# Patient Record
Sex: Male | Born: 1945
Health system: Southern US, Community
[De-identification: ages and names within clinical notes are randomized; demographics above are authoritative.]

## PROBLEM LIST (undated history)

## (undated) ENCOUNTER — Emergency Department (HOSPITAL_BASED_OUTPATIENT_CLINIC_OR_DEPARTMENT_OTHER): Admission: EM | Discharge status: Absent without leave | Payer: Medicare HMO | Source: Home / Self Care

## (undated) DIAGNOSIS — I739 Peripheral vascular disease, unspecified: Secondary | ICD-10-CM

## (undated) DIAGNOSIS — E7800 Pure hypercholesterolemia, unspecified: Secondary | ICD-10-CM

## (undated) DIAGNOSIS — L259 Unspecified contact dermatitis, unspecified cause: Secondary | ICD-10-CM

## (undated) DIAGNOSIS — N400 Enlarged prostate without lower urinary tract symptoms: Secondary | ICD-10-CM

## (undated) DIAGNOSIS — Z86010 Personal history of colonic polyps: Secondary | ICD-10-CM

## (undated) DIAGNOSIS — N138 Other obstructive and reflux uropathy: Secondary | ICD-10-CM

## (undated) DIAGNOSIS — J309 Allergic rhinitis, unspecified: Secondary | ICD-10-CM

## (undated) DIAGNOSIS — K219 Gastro-esophageal reflux disease without esophagitis: Secondary | ICD-10-CM

## (undated) HISTORY — DX: Allergic rhinitis, unspecified: J30.9

## (undated) HISTORY — PX: ROTATOR CUFF REPAIR: SHX139

## (undated) HISTORY — DX: Personal history of colonic polyps: Z86.010

## (undated) HISTORY — DX: Unspecified contact dermatitis, unspecified cause: L25.9

## (undated) HISTORY — PX: VASECTOMY: SHX75

## (undated) HISTORY — DX: Pure hypercholesterolemia, unspecified: E78.00

## (undated) HISTORY — DX: Peripheral vascular disease, unspecified: I73.9

## (undated) HISTORY — DX: Other obstructive and reflux uropathy: N13.8

## (undated) HISTORY — DX: Benign prostatic hyperplasia without lower urinary tract symptoms: N40.0

## (undated) HISTORY — DX: Gastro-esophageal reflux disease without esophagitis: K21.9

---

## 1999-06-03 ENCOUNTER — Ambulatory Visit (HOSPITAL_COMMUNITY): Admission: RE | Admit: 1999-06-03 | Discharge: 1999-06-03 | Payer: Self-pay | Admitting: Gastroenterology

## 2004-08-25 ENCOUNTER — Ambulatory Visit (HOSPITAL_COMMUNITY): Admission: RE | Admit: 2004-08-25 | Discharge: 2004-08-25 | Payer: Self-pay | Admitting: Gastroenterology

## 2004-09-08 ENCOUNTER — Ambulatory Visit: Payer: Self-pay | Admitting: Internal Medicine

## 2004-12-23 ENCOUNTER — Ambulatory Visit: Payer: Self-pay | Admitting: Internal Medicine

## 2005-02-12 ENCOUNTER — Ambulatory Visit (HOSPITAL_BASED_OUTPATIENT_CLINIC_OR_DEPARTMENT_OTHER): Admission: RE | Admit: 2005-02-12 | Discharge: 2005-02-12 | Payer: Self-pay | Admitting: Orthopedic Surgery

## 2005-02-12 ENCOUNTER — Observation Stay (HOSPITAL_COMMUNITY): Admission: AD | Admit: 2005-02-12 | Discharge: 2005-02-13 | Payer: Self-pay | Admitting: Orthopedic Surgery

## 2005-06-10 ENCOUNTER — Ambulatory Visit (HOSPITAL_COMMUNITY): Admission: RE | Admit: 2005-06-10 | Discharge: 2005-06-10 | Payer: Self-pay | Admitting: Orthopedic Surgery

## 2005-09-15 ENCOUNTER — Ambulatory Visit: Payer: Self-pay | Admitting: Internal Medicine

## 2005-10-14 ENCOUNTER — Ambulatory Visit: Payer: Self-pay | Admitting: Internal Medicine

## 2006-06-10 ENCOUNTER — Ambulatory Visit: Payer: Self-pay | Admitting: Internal Medicine

## 2006-09-22 ENCOUNTER — Ambulatory Visit: Payer: Self-pay | Admitting: Internal Medicine

## 2006-09-22 LAB — CONVERTED CEMR LAB
ALT: 24 units/L (ref 0–40)
AST: 35 units/L (ref 0–37)
Albumin: 4 g/dL (ref 3.5–5.2)
Alkaline Phosphatase: 77 units/L (ref 39–117)
BUN: 12 mg/dL (ref 6–23)
CO2: 31 meq/L (ref 19–32)
Calcium: 10.1 mg/dL (ref 8.4–10.5)
Chloride: 102 meq/L (ref 96–112)
Chol/HDL Ratio, serum: 4.1
Cholesterol: 259 mg/dL (ref 0–200)
Creatinine, Ser: 1 mg/dL (ref 0.4–1.5)
GFR calc non Af Amer: 81 mL/min
Glomerular Filtration Rate, Af Am: 98 mL/min/{1.73_m2}
Glucose, Bld: 95 mg/dL (ref 70–99)
HCT: 47.7 % (ref 39.0–52.0)
HDL: 62.5 mg/dL (ref >39.0)
Hemoglobin: 15.5 g/dL (ref 13.0–17.0)
LDL DIRECT: 193.1 mg/dL
MCHC: 32.4 g/dL (ref 30.0–36.0)
MCV: 92.7 fL (ref 78.0–100.0)
PSA: 0.47 ng/mL (ref 0.10–4.00)
Platelets: 261 10*3/uL (ref 150–400)
Potassium: 5.3 meq/L — ABNORMAL HIGH (ref 3.5–5.1)
RBC: 5.15 M/uL (ref 4.22–5.81)
RDW: 12.4 % (ref 11.5–14.6)
Sodium: 140 meq/L (ref 135–145)
Total Bilirubin: 0.8 mg/dL (ref 0.3–1.2)
Total Protein: 7.3 g/dL (ref 6.0–8.3)
Triglyceride fasting, serum: 87 mg/dL (ref 0–149)
VLDL: 17 mg/dL (ref 0–40)
WBC: 8.2 10*3/uL (ref 4.5–10.5)

## 2006-12-07 ENCOUNTER — Ambulatory Visit: Payer: Self-pay | Admitting: Internal Medicine

## 2006-12-07 LAB — CONVERTED CEMR LAB
AST: 26 units/L (ref 0–37)
Cholesterol: 207 mg/dL (ref 0–200)
Total CK: 81 units/L (ref 7–195)

## 2006-12-13 ENCOUNTER — Ambulatory Visit: Payer: Self-pay | Admitting: Internal Medicine

## 2007-05-27 ENCOUNTER — Encounter: Payer: Self-pay | Admitting: Internal Medicine

## 2007-05-27 DIAGNOSIS — N400 Enlarged prostate without lower urinary tract symptoms: Secondary | ICD-10-CM

## 2007-05-27 DIAGNOSIS — Z860100 Personal history of colon polyps, unspecified: Secondary | ICD-10-CM

## 2007-05-27 DIAGNOSIS — K219 Gastro-esophageal reflux disease without esophagitis: Secondary | ICD-10-CM

## 2007-05-27 HISTORY — DX: Gastro-esophageal reflux disease without esophagitis: K21.9

## 2007-05-27 HISTORY — DX: Personal history of colonic polyps: Z86.010

## 2007-05-27 HISTORY — DX: Personal history of colon polyps, unspecified: Z86.0100

## 2007-05-27 HISTORY — DX: Benign prostatic hyperplasia without lower urinary tract symptoms: N40.0

## 2007-06-09 ENCOUNTER — Ambulatory Visit: Payer: Self-pay | Admitting: Internal Medicine

## 2007-09-26 ENCOUNTER — Ambulatory Visit: Payer: Self-pay | Admitting: Internal Medicine

## 2007-09-26 DIAGNOSIS — E7800 Pure hypercholesterolemia, unspecified: Secondary | ICD-10-CM

## 2007-09-26 DIAGNOSIS — J309 Allergic rhinitis, unspecified: Secondary | ICD-10-CM | POA: Insufficient documentation

## 2007-09-26 HISTORY — DX: Pure hypercholesterolemia, unspecified: E78.00

## 2007-09-26 HISTORY — DX: Allergic rhinitis, unspecified: J30.9

## 2007-09-26 LAB — CONVERTED CEMR LAB
ALT: 41 units/L (ref 0–53)
AST: 37 units/L (ref 0–37)
Albumin: 4.1 g/dL (ref 3.5–5.2)
Alkaline Phosphatase: 87 units/L (ref 39–117)
BUN: 11 mg/dL (ref 6–23)
Basophils Absolute: 0.1 10*3/uL (ref 0.0–0.1)
Basophils Relative: 0.8 % (ref 0.0–1.0)
Bilirubin, Direct: 0.2 mg/dL (ref 0.0–0.3)
CO2: 28 meq/L (ref 19–32)
Calcium: 10.1 mg/dL (ref 8.4–10.5)
Chloride: 97 meq/L (ref 96–112)
Cholesterol: 187 mg/dL (ref 0–200)
Creatinine, Ser: 0.9 mg/dL (ref 0.4–1.5)
Eosinophils Absolute: 0.5 10*3/uL (ref 0.0–0.6)
Eosinophils Relative: 6.3 % — ABNORMAL HIGH (ref 0.0–5.0)
GFR calc Af Amer: 110 mL/min
GFR calc non Af Amer: 91 mL/min
Glucose, Bld: 96 mg/dL (ref 70–99)
HCT: 44.9 % (ref 39.0–52.0)
HDL: 66.3 mg/dL (ref >39.0)
Hemoglobin: 15.5 g/dL (ref 13.0–17.0)
LDL Cholesterol: 105 mg/dL — ABNORMAL HIGH (ref 0–99)
Lymphocytes Relative: 28.5 % (ref 12.0–46.0)
MCHC: 34.6 g/dL (ref 30.0–36.0)
MCV: 90 fL (ref 78.0–100.0)
Monocytes Absolute: 0.6 10*3/uL (ref 0.2–0.7)
Monocytes Relative: 8.1 % (ref 3.0–11.0)
Neutro Abs: 4.5 10*3/uL (ref 1.4–7.7)
Neutrophils Relative %: 56.3 % (ref 43.0–77.0)
PSA: 1.15 ng/mL (ref 0.10–4.00)
Platelets: 230 10*3/uL (ref 150–400)
Potassium: 4.9 meq/L (ref 3.5–5.1)
RBC: 4.99 M/uL (ref 4.22–5.81)
RDW: 12.4 % (ref 11.5–14.6)
Sodium: 134 meq/L — ABNORMAL LOW (ref 135–145)
TSH: 1.07 microintl units/mL (ref 0.35–5.50)
Total Bilirubin: 0.9 mg/dL (ref 0.3–1.2)
Total CHOL/HDL Ratio: 2.8
Total Protein: 7.3 g/dL (ref 6.0–8.3)
Triglycerides: 79 mg/dL (ref 0–149)
VLDL: 16 mg/dL (ref 0–40)
WBC: 8 10*3/uL (ref 4.5–10.5)

## 2007-10-26 ENCOUNTER — Telehealth: Payer: Self-pay | Admitting: Internal Medicine

## 2007-12-06 ENCOUNTER — Telehealth (INDEPENDENT_AMBULATORY_CARE_PROVIDER_SITE_OTHER): Payer: Self-pay | Admitting: *Deleted

## 2007-12-22 ENCOUNTER — Encounter: Payer: Self-pay | Admitting: Internal Medicine

## 2008-01-16 ENCOUNTER — Observation Stay (HOSPITAL_COMMUNITY): Admission: EM | Admit: 2008-01-16 | Discharge: 2008-01-17 | Payer: Self-pay | Admitting: Emergency Medicine

## 2008-01-16 ENCOUNTER — Ambulatory Visit: Payer: Self-pay | Admitting: Internal Medicine

## 2008-01-16 IMAGING — CR DG CHEST 1V PORT
1 series · 1 of 1 positions shown · non-contrast
Comparison: None

CLINICAL DATA: Chest pain

PORTABLE CHEST - 1 VIEW

[AP]
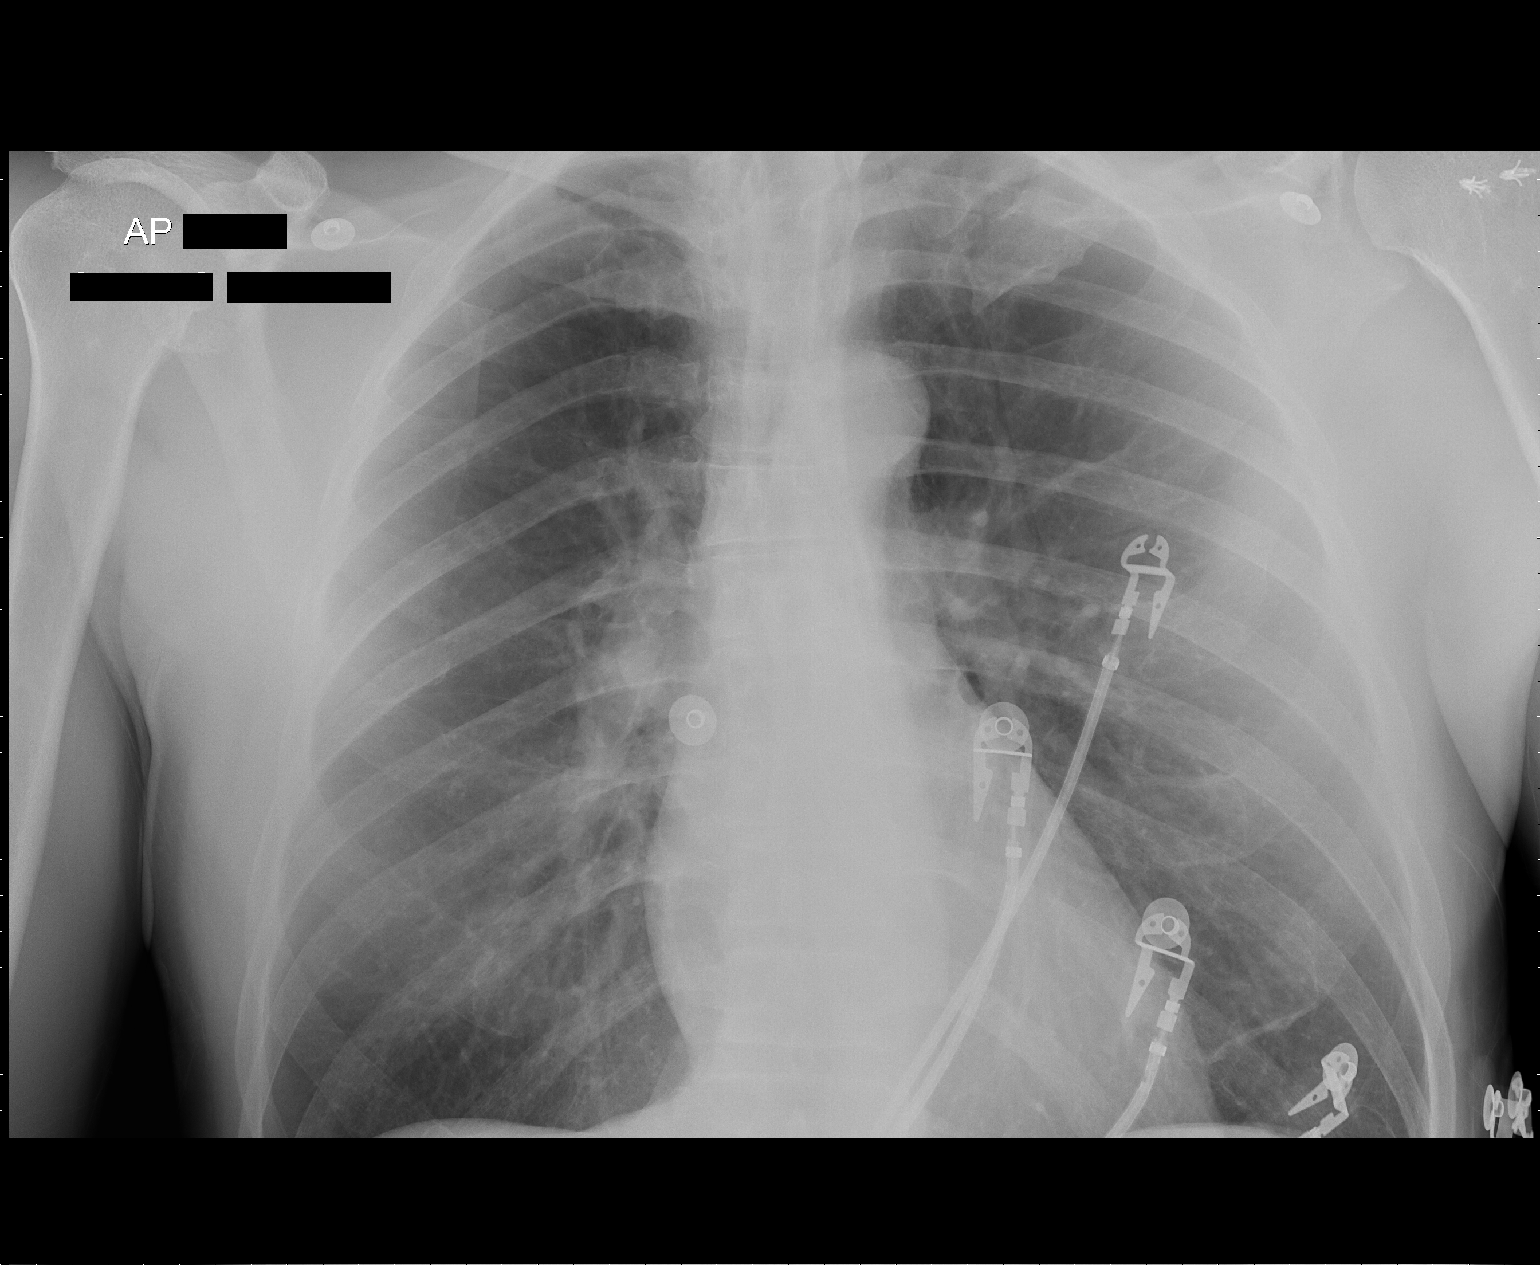

[1 of 1 positions shown; findings below may reference images not displayed]

FINDINGS: The cardiomediastinal silhouette is unremarkable.  There is no
acute infiltrate or pleural effusion.  There is no pulmonary edema.
2  metallic anchors are seen left humeral head.
IMPRESSION: No active disease.

## 2008-01-20 ENCOUNTER — Ambulatory Visit: Payer: Self-pay

## 2008-01-20 ENCOUNTER — Encounter: Payer: Self-pay | Admitting: Internal Medicine

## 2008-01-23 ENCOUNTER — Telehealth: Payer: Self-pay | Admitting: Internal Medicine

## 2008-02-13 ENCOUNTER — Ambulatory Visit: Payer: Self-pay | Admitting: Cardiovascular Disease

## 2008-06-26 ENCOUNTER — Telehealth: Payer: Self-pay | Admitting: Internal Medicine

## 2008-09-11 ENCOUNTER — Telehealth: Payer: Self-pay | Admitting: Internal Medicine

## 2008-09-25 ENCOUNTER — Ambulatory Visit: Payer: Self-pay | Admitting: Internal Medicine

## 2008-09-25 LAB — CONVERTED CEMR LAB
ALT: 23 units/L (ref 0–53)
AST: 32 units/L (ref 0–37)
Albumin: 4.2 g/dL (ref 3.5–5.2)
Alkaline Phosphatase: 74 units/L (ref 39–117)
BUN: 8 mg/dL (ref 6–23)
Basophils Absolute: 0 10*3/uL (ref 0.0–0.1)
Basophils Relative: 0.3 % (ref 0.0–3.0)
Bilirubin, Direct: 0.1 mg/dL (ref 0.0–0.3)
CO2: 32 meq/L (ref 19–32)
Calcium: 9.7 mg/dL (ref 8.4–10.5)
Chloride: 101 meq/L (ref 96–112)
Cholesterol: 174 mg/dL (ref 0–200)
Creatinine, Ser: 0.9 mg/dL (ref 0.4–1.5)
Eosinophils Absolute: 0.3 10*3/uL (ref 0.0–0.7)
Eosinophils Relative: 4.3 % (ref 0.0–5.0)
GFR calc Af Amer: 110 mL/min
GFR calc non Af Amer: 91 mL/min
Glucose, Bld: 102 mg/dL — ABNORMAL HIGH (ref 70–99)
HCT: 48.7 % (ref 39.0–52.0)
HDL: 77.1 mg/dL (ref >39.0)
Hemoglobin: 16.2 g/dL (ref 13.0–17.0)
LDL Cholesterol: 85 mg/dL (ref 0–99)
Lymphocytes Relative: 23.5 % (ref 12.0–46.0)
MCHC: 33.1 g/dL (ref 30.0–36.0)
MCV: 92.4 fL (ref 78.0–100.0)
Monocytes Absolute: 0.6 10*3/uL (ref 0.1–1.0)
Monocytes Relative: 8.8 % (ref 3.0–12.0)
Neutro Abs: 4 10*3/uL (ref 1.4–7.7)
Neutrophils Relative %: 63.1 % (ref 43.0–77.0)
PSA: 1.32 ng/mL (ref 0.10–4.00)
Platelets: 212 10*3/uL (ref 150–400)
Potassium: 4.6 meq/L (ref 3.5–5.1)
RBC: 5.27 M/uL (ref 4.22–5.81)
RDW: 12.5 % (ref 11.5–14.6)
Sodium: 139 meq/L (ref 135–145)
TSH: 1.25 microintl units/mL (ref 0.35–5.50)
Total Bilirubin: 1 mg/dL (ref 0.3–1.2)
Total CHOL/HDL Ratio: 2.3
Total Protein: 7.9 g/dL (ref 6.0–8.3)
Triglycerides: 59 mg/dL (ref 0–149)
VLDL: 12 mg/dL (ref 0–40)
WBC: 6.4 10*3/uL (ref 4.5–10.5)

## 2008-09-27 LAB — CONVERTED CEMR LAB: HCV Ab: NEGATIVE

## 2008-10-25 ENCOUNTER — Telehealth: Payer: Self-pay | Admitting: Internal Medicine

## 2008-11-30 IMAGING — CR DG WRIST COMPLETE 3+V*L*
4 series · 4 of 4 positions shown · non-contrast
Comparison: None

CLINICAL DATA: Wrist injury with pain.

LEFT WRIST - COMPLETE 3+ VIEW

[x wrist pa left]
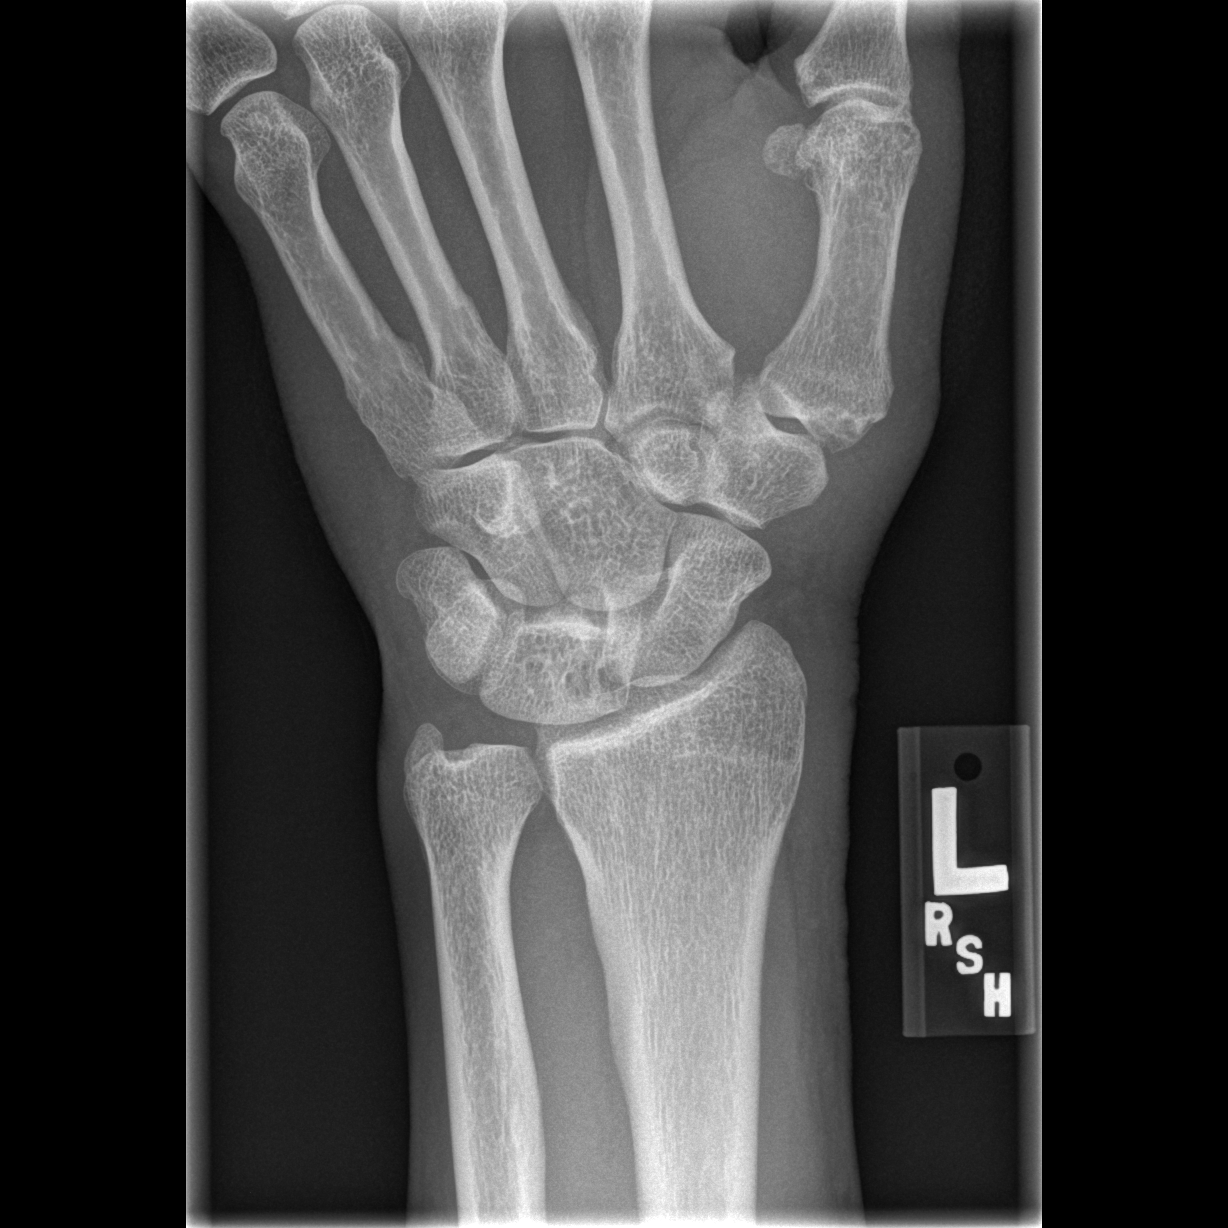

[x wrist obl left]
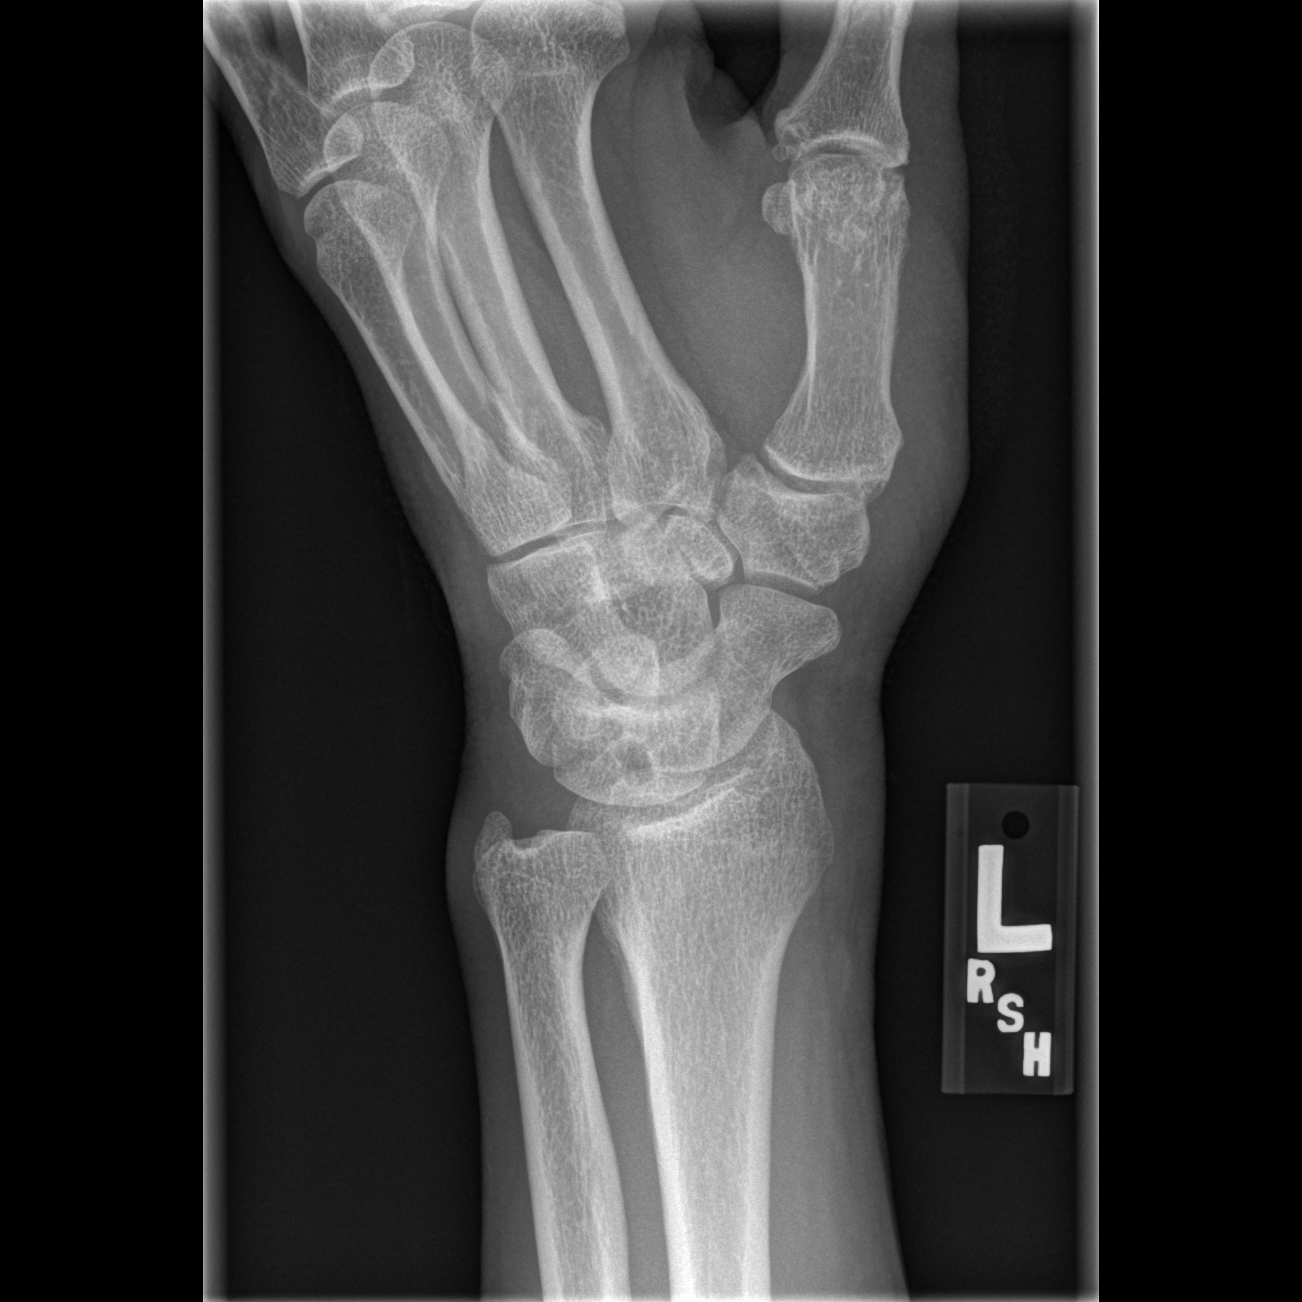

[x wrist lat left]
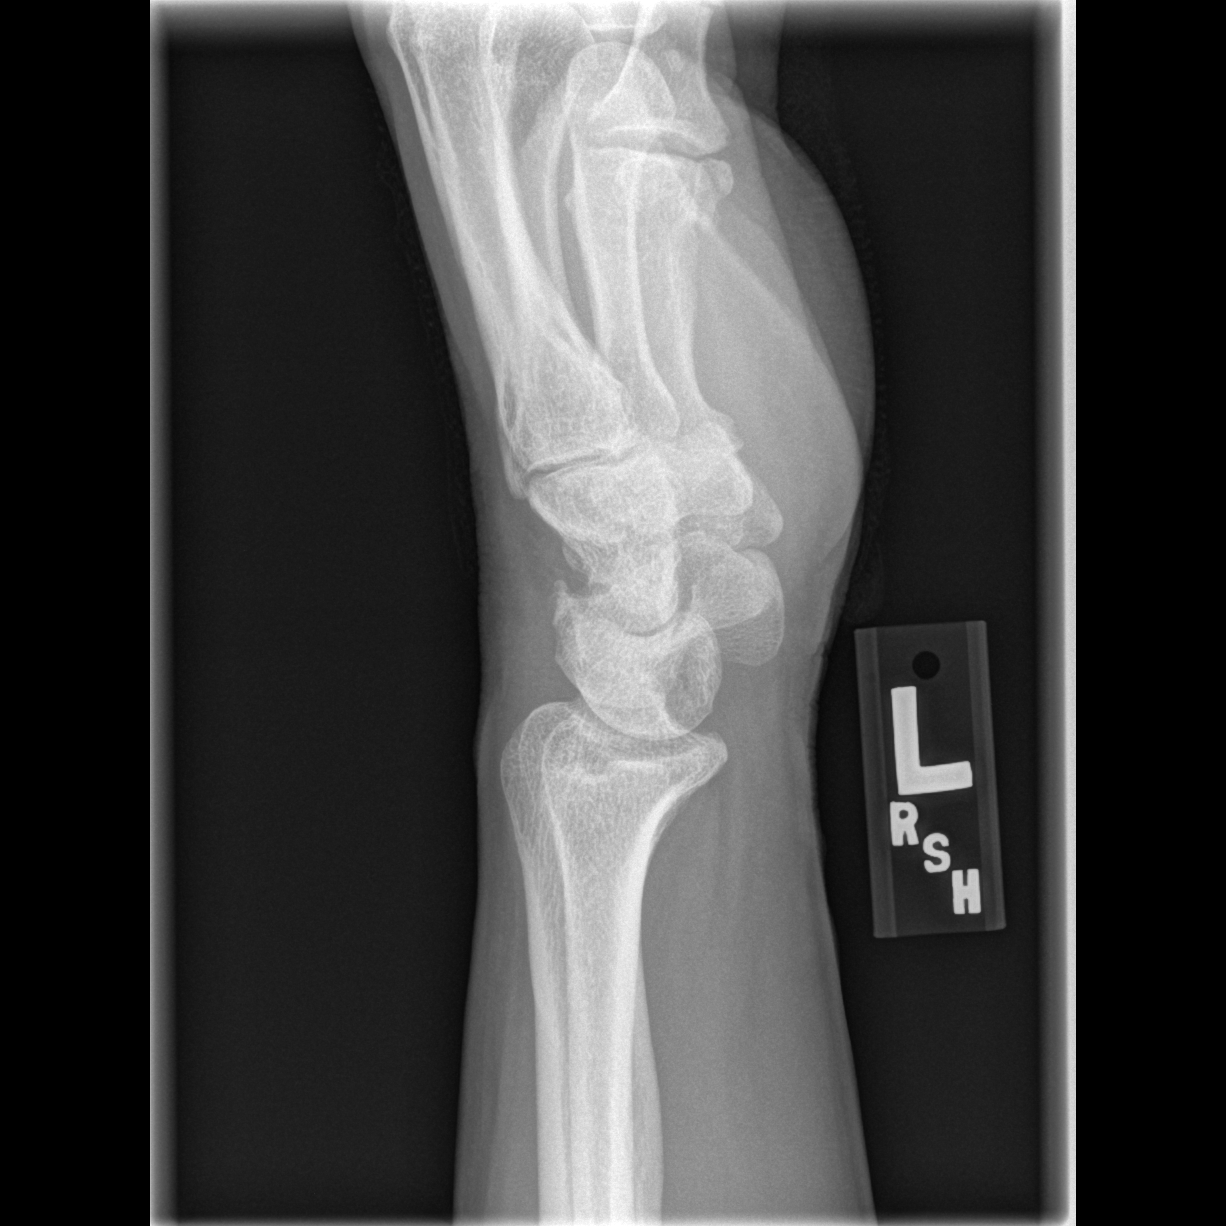

[x navicular]
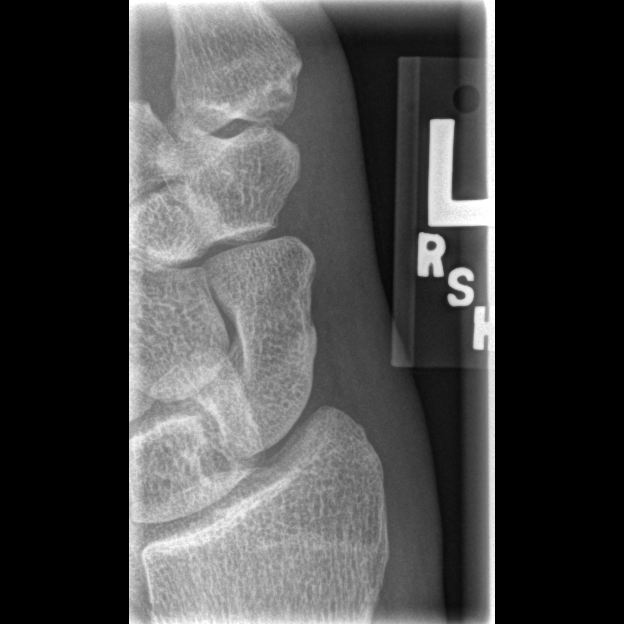

[4 of 4 positions shown; findings below may reference images not displayed]

FINDINGS: There is no evidence of acute fracture, subluxation, or
dislocation.
Mild degenerative changes within the wrist and first
carpometacarpal joint are noted.
Lunate cysts are present.
No radiopaque foreign bodies are identified.
IMPRESSION: No evidence of acute bony abnormality.

## 2009-01-22 ENCOUNTER — Telehealth: Payer: Self-pay | Admitting: Internal Medicine

## 2009-01-23 ENCOUNTER — Telehealth: Payer: Self-pay | Admitting: Internal Medicine

## 2009-01-28 ENCOUNTER — Telehealth: Payer: Self-pay | Admitting: Internal Medicine

## 2009-08-06 ENCOUNTER — Encounter: Payer: Self-pay | Admitting: Internal Medicine

## 2009-09-25 ENCOUNTER — Ambulatory Visit: Payer: Self-pay | Admitting: Internal Medicine

## 2009-10-07 ENCOUNTER — Telehealth: Payer: Self-pay | Admitting: Internal Medicine

## 2009-10-14 ENCOUNTER — Encounter: Payer: Self-pay | Admitting: Internal Medicine

## 2009-10-19 LAB — HM COLONOSCOPY

## 2009-10-25 ENCOUNTER — Ambulatory Visit: Payer: Self-pay | Admitting: Internal Medicine

## 2009-10-25 DIAGNOSIS — J069 Acute upper respiratory infection, unspecified: Secondary | ICD-10-CM | POA: Insufficient documentation

## 2009-11-04 ENCOUNTER — Encounter: Payer: Self-pay | Admitting: Internal Medicine

## 2009-11-19 ENCOUNTER — Ambulatory Visit: Payer: Self-pay | Admitting: Internal Medicine

## 2009-11-19 DIAGNOSIS — J019 Acute sinusitis, unspecified: Secondary | ICD-10-CM | POA: Insufficient documentation

## 2009-11-22 ENCOUNTER — Telehealth: Payer: Self-pay | Admitting: Internal Medicine

## 2009-11-22 ENCOUNTER — Emergency Department (HOSPITAL_COMMUNITY): Admission: EM | Admit: 2009-11-22 | Discharge: 2009-11-22 | Payer: Self-pay | Admitting: Emergency Medicine

## 2009-11-22 IMAGING — CR DG FINGER THUMB 2+V*L*
3 series · 3 of 3 positions shown · non-contrast
Comparison: None

Addendum Begins

This should read there is an oblique fracture through the proximal
phalanx.
Addendum Ends
CLINICAL DATA: Injury to left thumb.
LEFT THUMB 2+V

[x finger pa left]
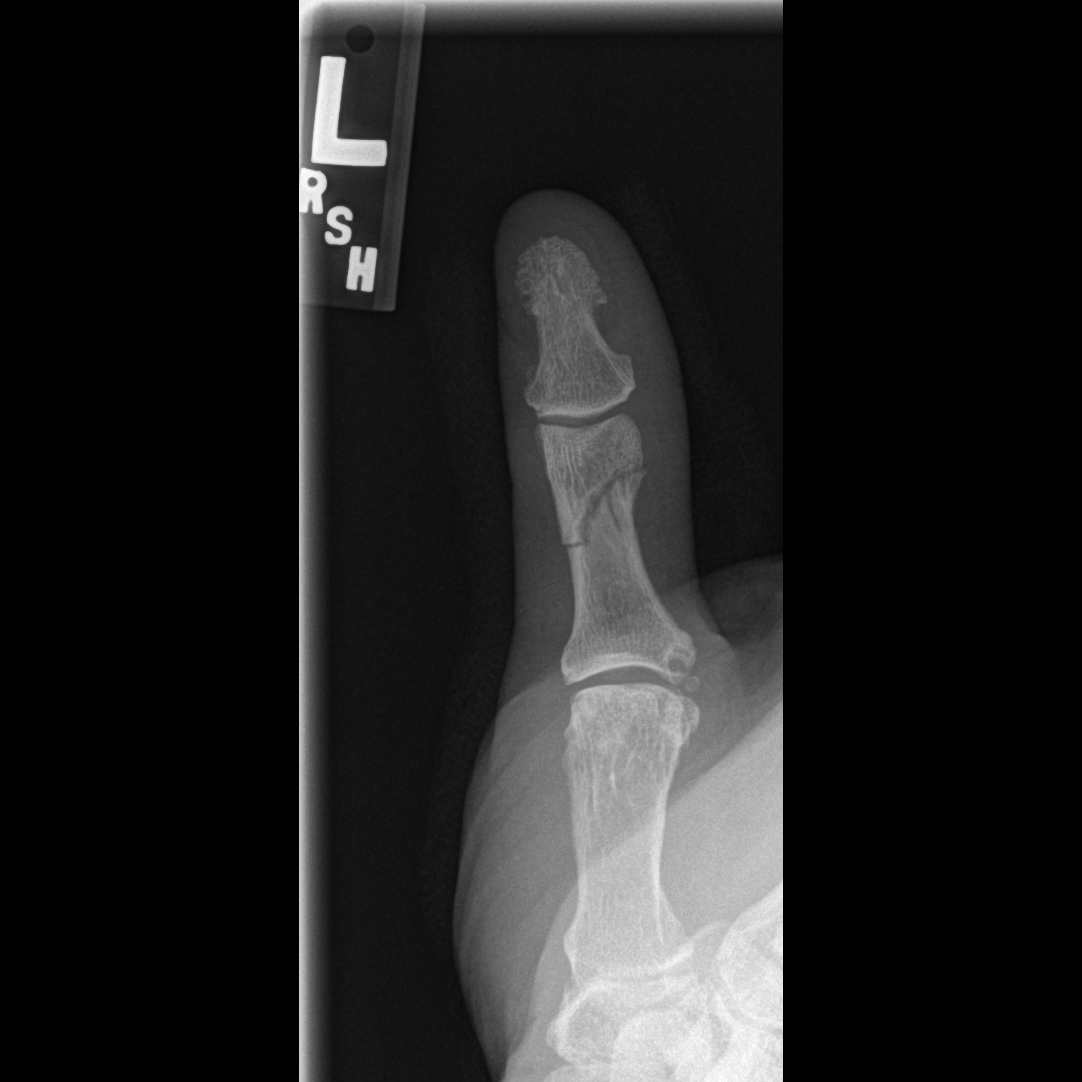

[x finger obl. left]
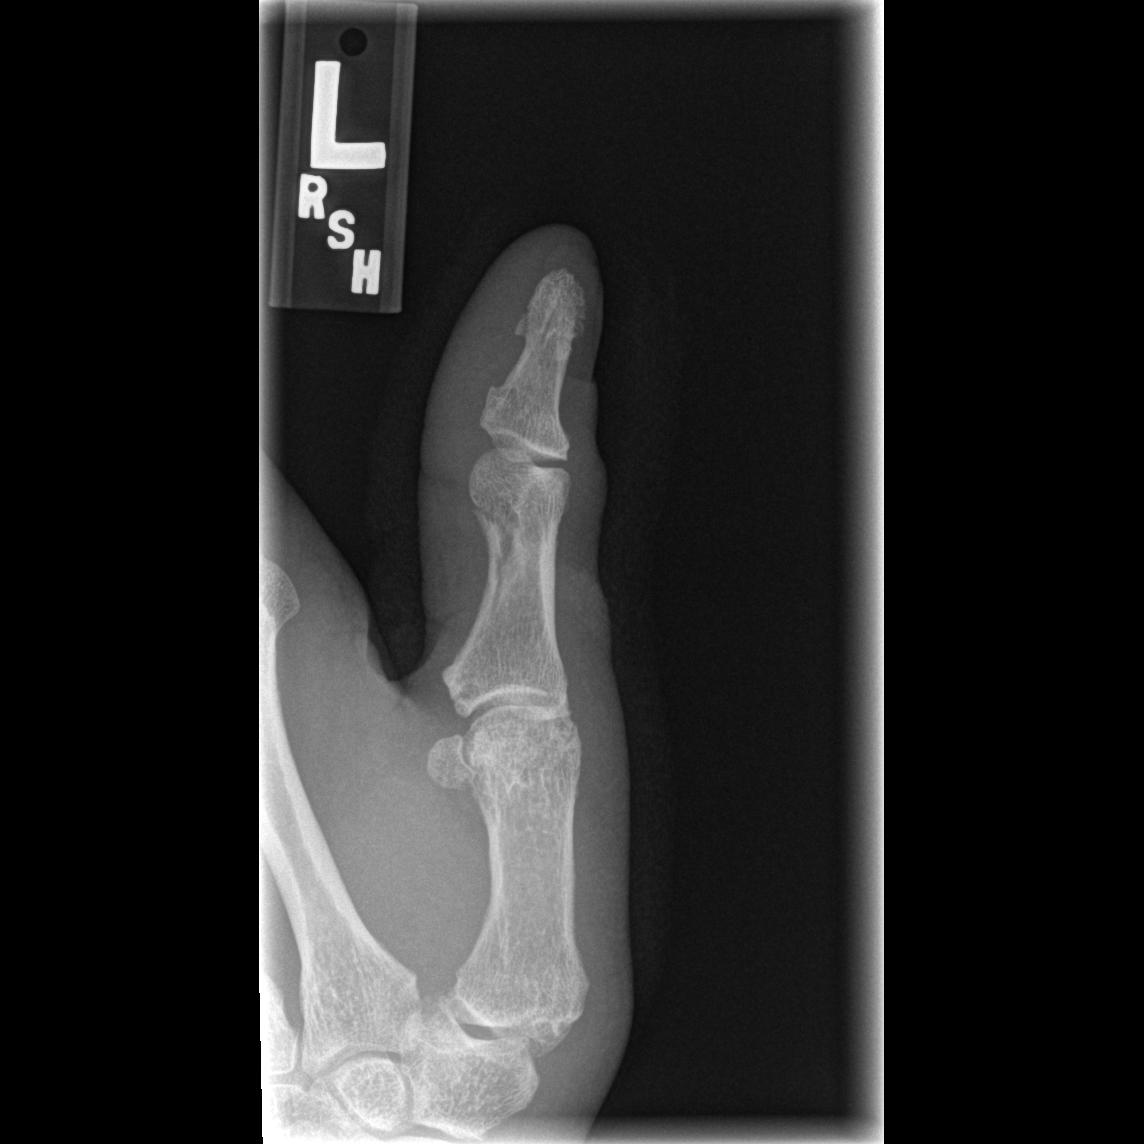

[x finger lateral left]
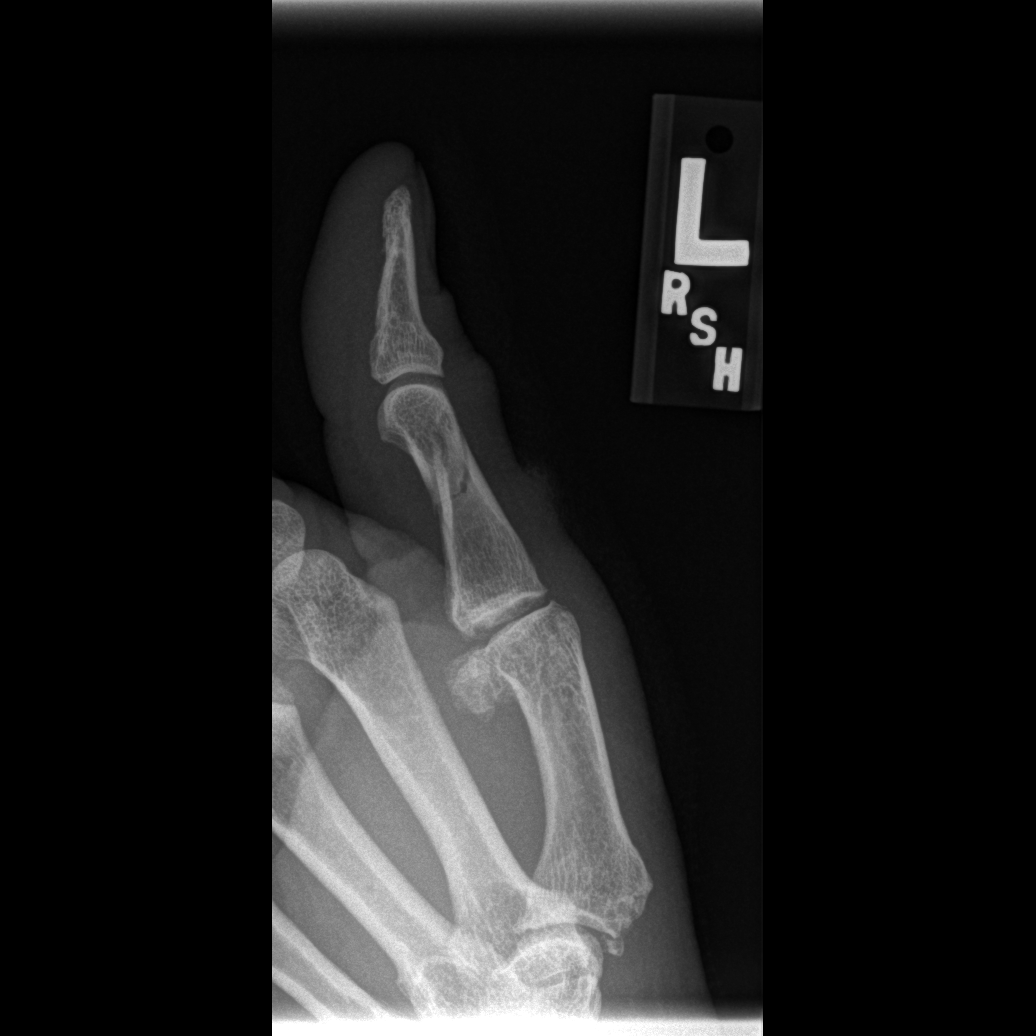

[3 of 3 positions shown; findings below may reference images not displayed]

FINDINGS: An oblique fracture through the middle phalanx is noted.
This fracture does not extend intraarticularly.
There is no evidence of subluxation or dislocation.
Mild degenerative changes at the first carpometacarpal joint and
first MCP joint are noted.
No radiopaque foreign bodies are identified.
IMPRESSION: Oblique fracture of the middle phalanx.

## 2009-11-22 IMAGING — CR DG FOREARM 2V*L*
2 series · 2 of 2 positions shown · non-contrast
Comparison: [HOSPITAL] left thumb radiographs and left
wrist radiographs [DATE].

CLINICAL DATA: Laceration left thumb and wrist.

LEFT FOREARM - 2 VIEW

[x forearm ap left]
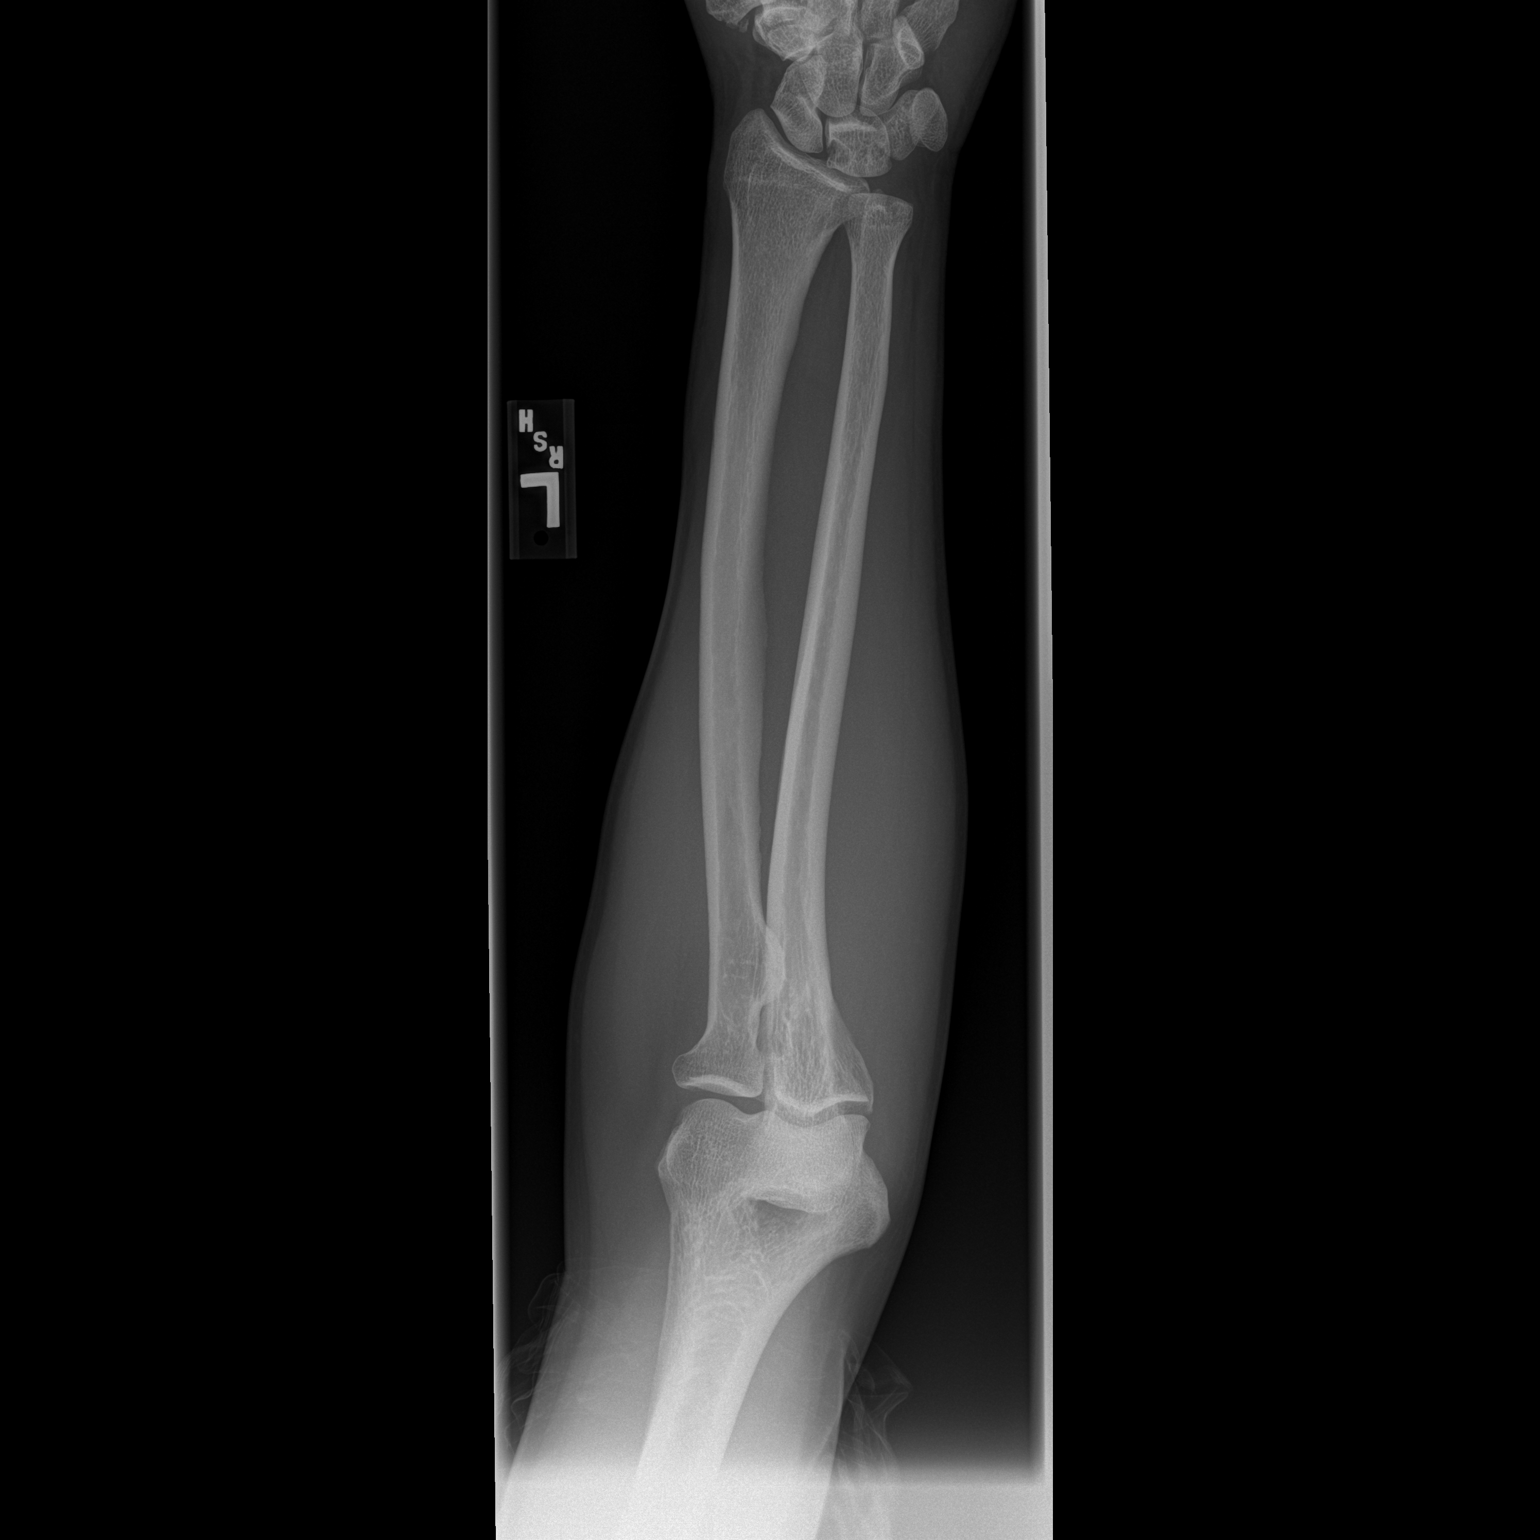

[x forearm lat left]
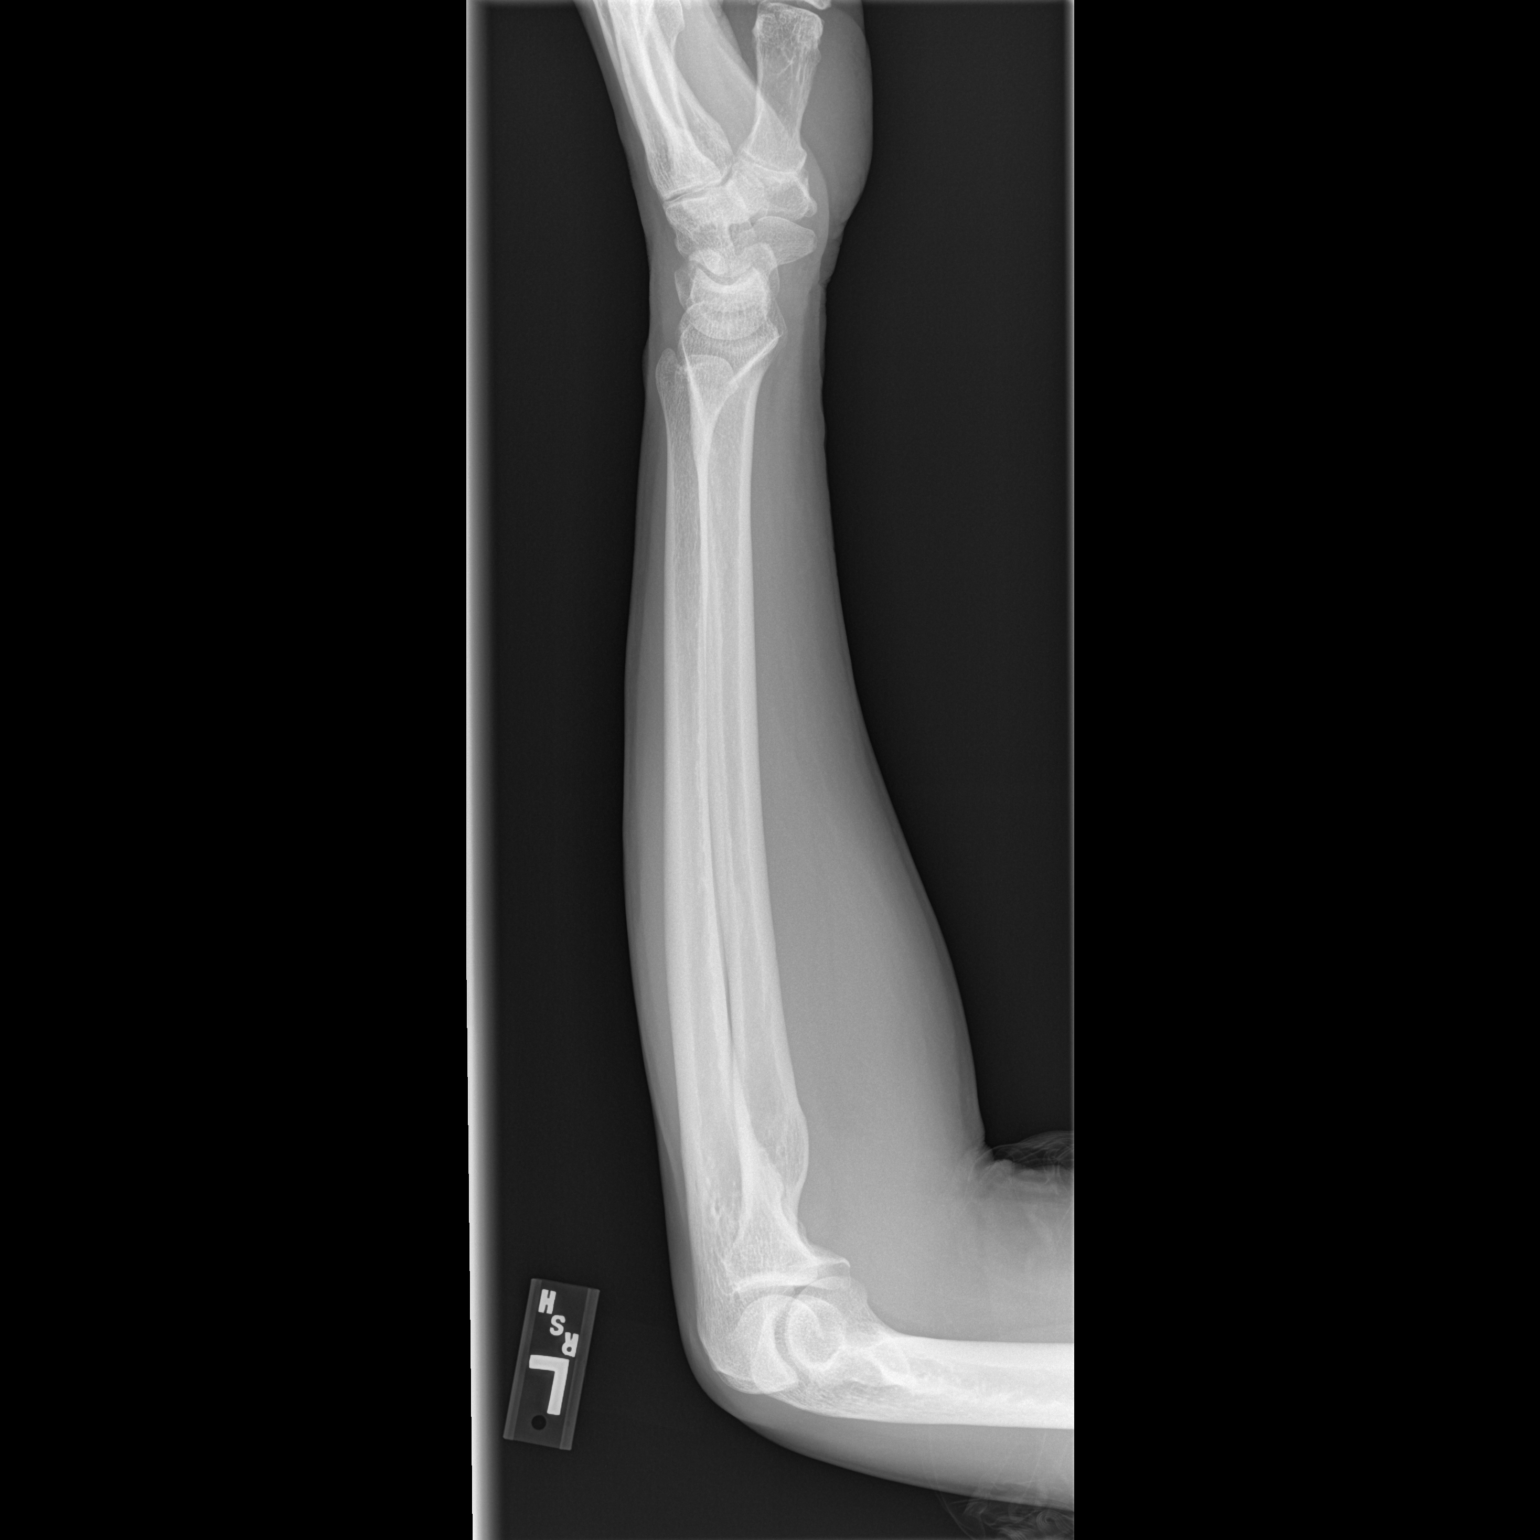

[2 of 2 positions shown; findings below may reference images not displayed]

FINDINGS: No acute fracture, subluxation, dislocation or radiopaque
foreign bodies seen. Slight degenerative lunate subchondral cysts
seen.
IMPRESSION: No acute findings.

## 2009-11-22 IMAGING — CR DG HAND COMPLETE 3+V*L*
3 series · 3 of 3 positions shown · non-contrast
Comparison: None.

CLINICAL DATA: Left hand laceration.

LEFT HAND - COMPLETE 3+ VIEW

[x hand pa left]
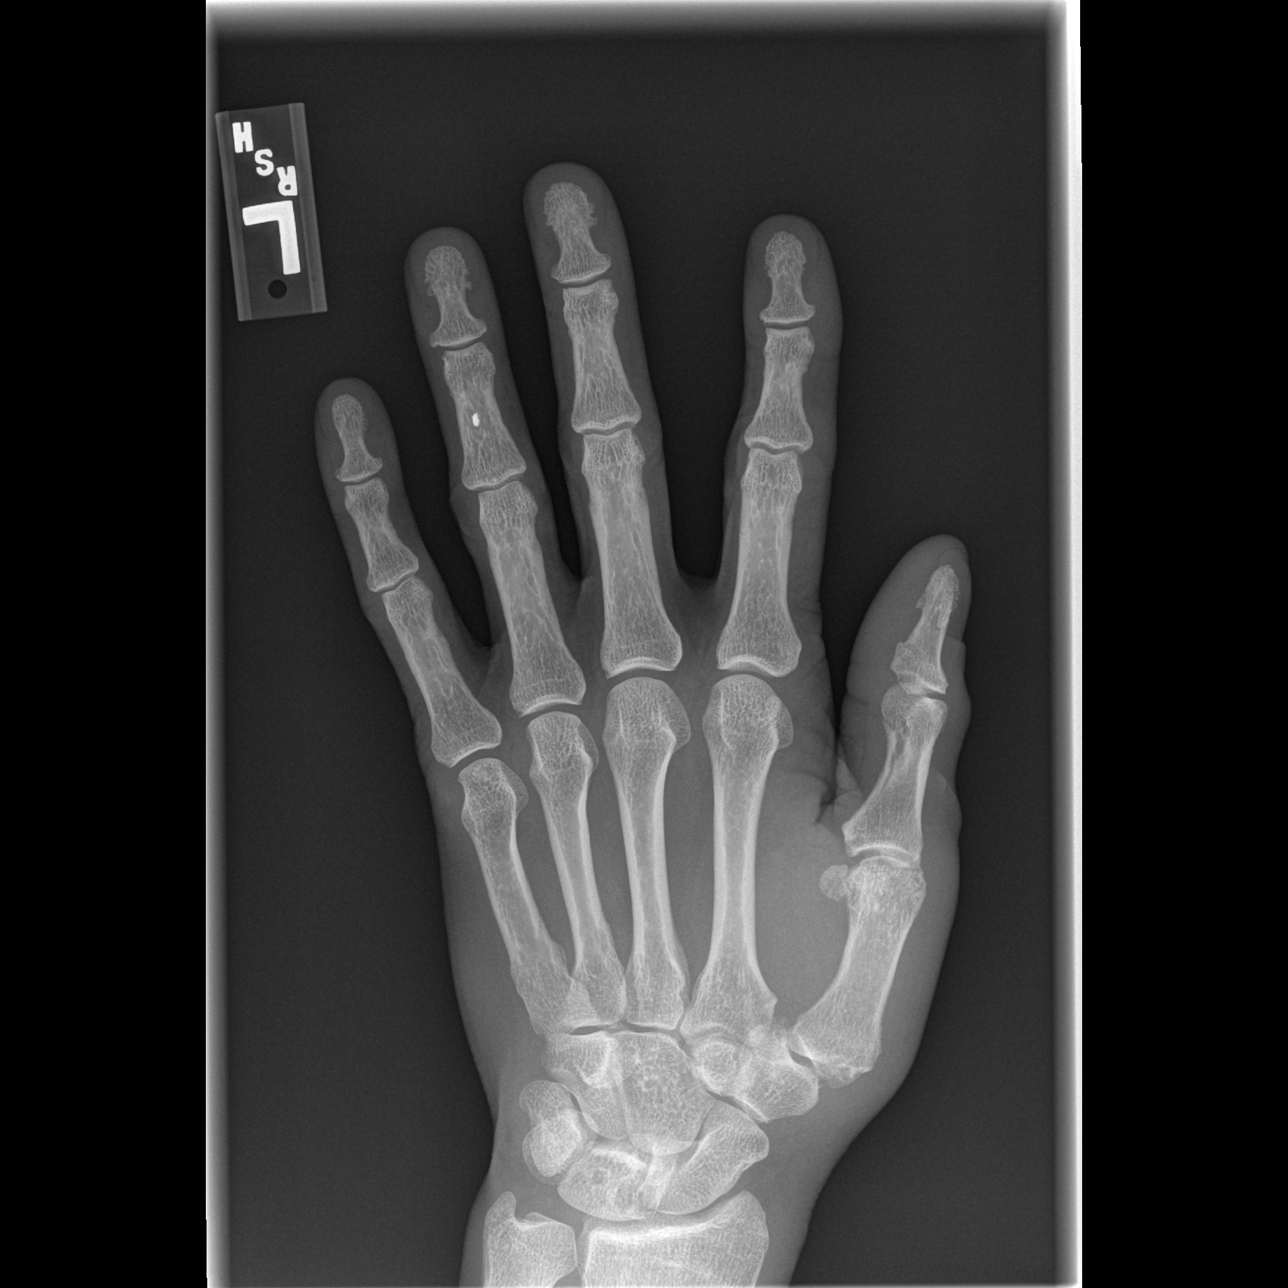

[x hand oblique left]
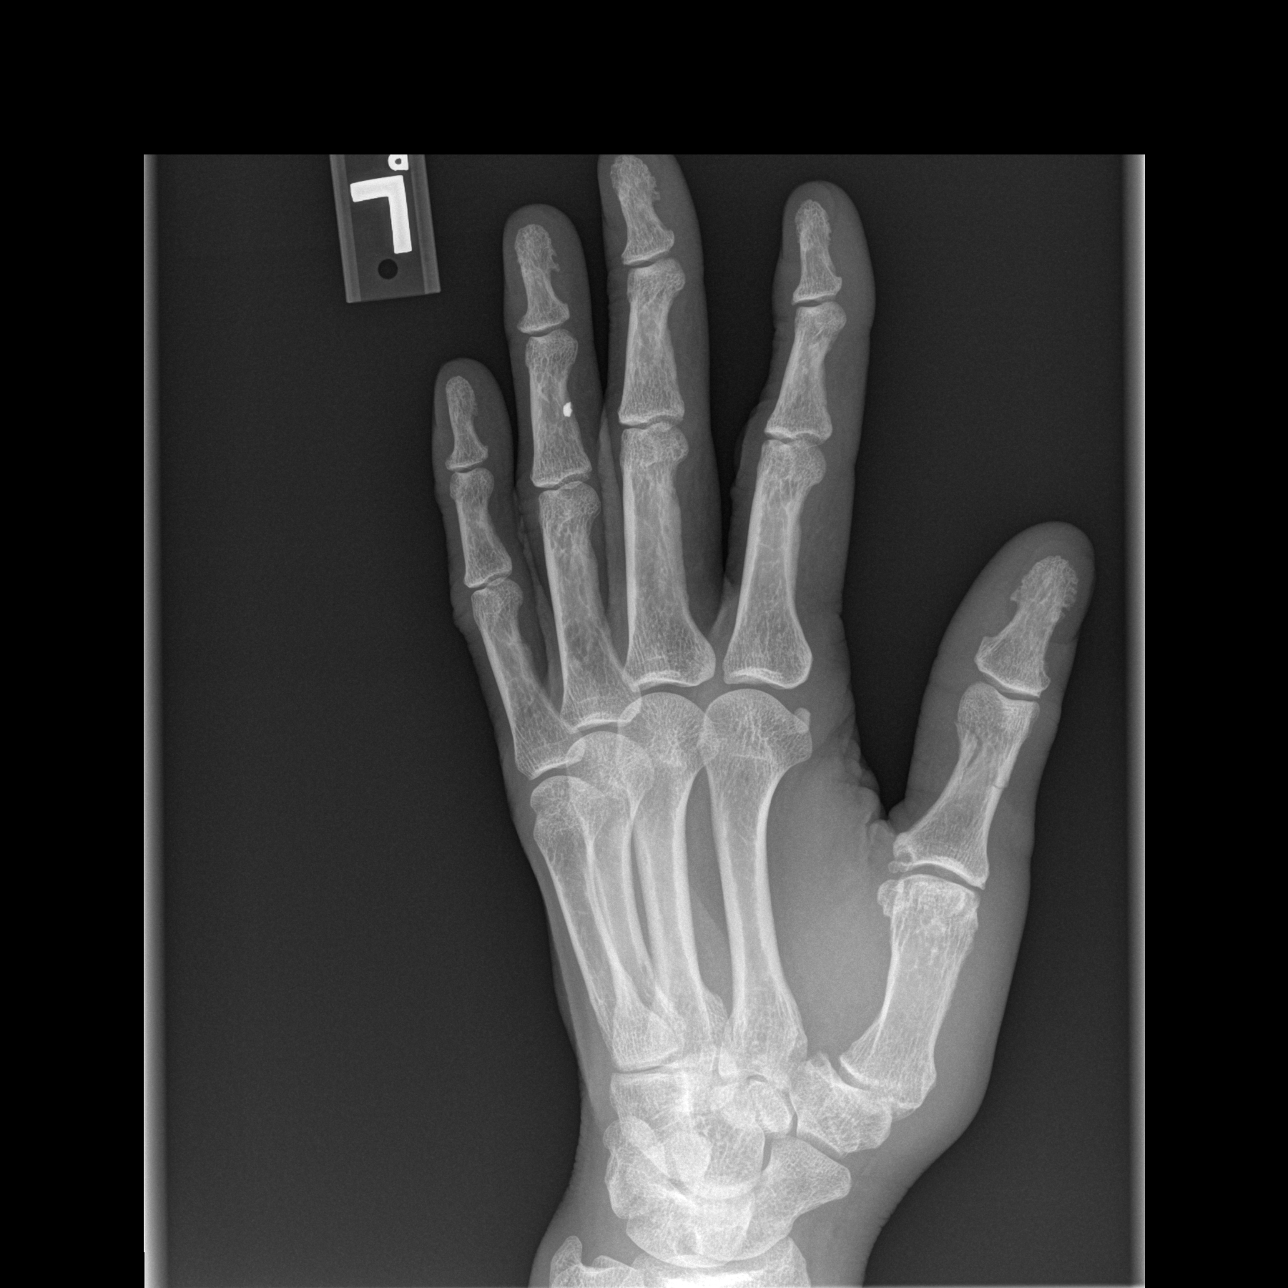

[x hand lat left]
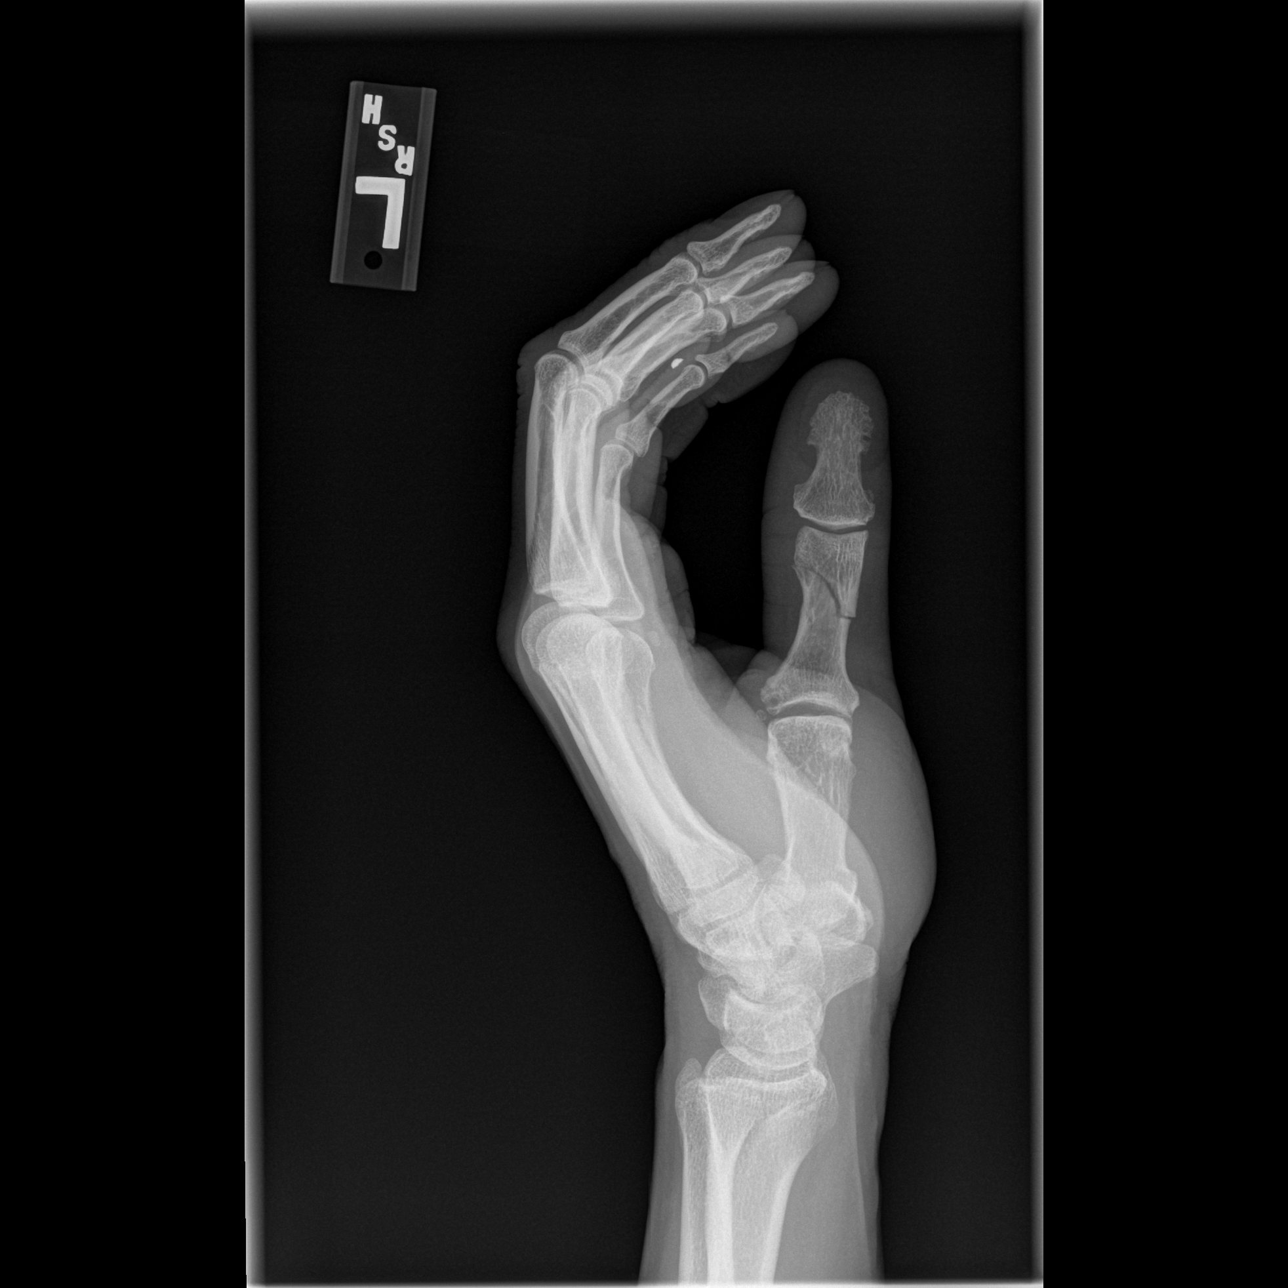

[3 of 3 positions shown; findings below may reference images not displayed]

FINDINGS: There is an irregular  oblique fracture through the shaft
of the proximal phalangeal bone of the thumb with minimal
displacement and no angulation.

There is a radiodense foreign body in the soft tissues along the
anterior aspect of the middle phalangeal bone of the ring finger.

There are osteoarthritic changes of the first carpal metacarpal
joint and there are cystic degenerative changes of the lunate.
IMPRESSION: 1.  Acute fracture of the proximal phalangeal bone of the left
thumb.
2.  No radiodense foreign body in the soft tissues of the ring
finger which may be old.
3.  Osteoarthritic changes in the wrist.

## 2010-07-30 ENCOUNTER — Ambulatory Visit: Payer: Self-pay | Admitting: Internal Medicine

## 2010-07-30 ENCOUNTER — Ambulatory Visit: Payer: Self-pay

## 2010-07-30 DIAGNOSIS — R609 Edema, unspecified: Secondary | ICD-10-CM | POA: Insufficient documentation

## 2010-08-25 ENCOUNTER — Ambulatory Visit: Payer: Self-pay | Admitting: Internal Medicine

## 2010-08-25 DIAGNOSIS — L259 Unspecified contact dermatitis, unspecified cause: Secondary | ICD-10-CM

## 2010-08-25 HISTORY — DX: Unspecified contact dermatitis, unspecified cause: L25.9

## 2010-09-03 ENCOUNTER — Telehealth: Payer: Self-pay | Admitting: Internal Medicine

## 2010-09-04 ENCOUNTER — Ambulatory Visit: Payer: Self-pay | Admitting: Internal Medicine

## 2010-09-26 ENCOUNTER — Encounter: Payer: Self-pay | Admitting: Internal Medicine

## 2010-09-26 ENCOUNTER — Ambulatory Visit: Payer: Self-pay | Admitting: Internal Medicine

## 2010-09-26 ENCOUNTER — Telehealth: Payer: Self-pay | Admitting: Internal Medicine

## 2010-09-26 LAB — CONVERTED CEMR LAB
ALT: 27 units/L (ref 0–53)
AST: 46 units/L — ABNORMAL HIGH (ref 0–37)
Albumin: 4.1 g/dL (ref 3.5–5.2)
Alkaline Phosphatase: 71 units/L (ref 39–117)
BUN: 12 mg/dL (ref 6–23)
Basophils Absolute: 0 10*3/uL (ref 0.0–0.1)
Basophils Relative: 0.5 % (ref 0.0–3.0)
Bilirubin, Direct: 0.1 mg/dL (ref 0.0–0.3)
CO2: 31 meq/L (ref 19–32)
Calcium: 9.5 mg/dL (ref 8.4–10.5)
Chloride: 102 meq/L (ref 96–112)
Cholesterol: 157 mg/dL (ref 0–200)
Creatinine, Ser: 0.8 mg/dL (ref 0.4–1.5)
Eosinophils Absolute: 0.6 10*3/uL (ref 0.0–0.7)
Eosinophils Relative: 8.4 % — ABNORMAL HIGH (ref 0.0–5.0)
GFR calc non Af Amer: 100.39 mL/min (ref >60.00)
Glucose, Bld: 92 mg/dL (ref 70–99)
HCT: 44.8 % (ref 39.0–52.0)
HDL: 63 mg/dL (ref >39.00)
Hemoglobin: 15.2 g/dL (ref 13.0–17.0)
LDL Cholesterol: 85 mg/dL (ref 0–99)
Lymphocytes Relative: 28.6 % (ref 12.0–46.0)
Lymphs Abs: 2 10*3/uL (ref 0.7–4.0)
MCHC: 33.8 g/dL (ref 30.0–36.0)
MCV: 93.4 fL (ref 78.0–100.0)
Monocytes Absolute: 0.5 10*3/uL (ref 0.1–1.0)
Monocytes Relative: 7.8 % (ref 3.0–12.0)
Neutro Abs: 3.7 10*3/uL (ref 1.4–7.7)
Neutrophils Relative %: 54.7 % (ref 43.0–77.0)
Platelets: 213 10*3/uL (ref 150.0–400.0)
Potassium: 4.2 meq/L (ref 3.5–5.1)
RBC: 4.8 M/uL (ref 4.22–5.81)
RDW: 13.4 % (ref 11.5–14.6)
Sodium: 140 meq/L (ref 135–145)
TSH: 0.92 microintl units/mL (ref 0.35–5.50)
Total Bilirubin: 0.6 mg/dL (ref 0.3–1.2)
Total CHOL/HDL Ratio: 2
Total Protein: 7.1 g/dL (ref 6.0–8.3)
Triglycerides: 47 mg/dL (ref 0.0–149.0)
VLDL: 9.4 mg/dL (ref 0.0–40.0)
WBC: 6.8 10*3/uL (ref 4.5–10.5)

## 2010-09-30 ENCOUNTER — Encounter: Payer: Self-pay | Admitting: Internal Medicine

## 2010-10-27 ENCOUNTER — Telehealth: Payer: Self-pay | Admitting: Internal Medicine

## 2010-10-29 ENCOUNTER — Ambulatory Visit
Admission: RE | Admit: 2010-10-29 | Discharge: 2010-10-29 | Payer: Self-pay | Source: Home / Self Care | Attending: Internal Medicine | Admitting: Internal Medicine

## 2010-10-29 ENCOUNTER — Other Ambulatory Visit: Payer: Self-pay | Admitting: Internal Medicine

## 2010-10-29 LAB — PSA: PSA: 0.91 ng/mL (ref 0.10–4.00)

## 2010-11-16 LAB — CONVERTED CEMR LAB: PSA: 0.56 ng/mL (ref 0.10–4.00)

## 2010-11-18 NOTE — Assessment & Plan Note (Signed)
Summary: insect bites//ccm   Vital Signs:  Patient profile:   65 year old male Weight:      174 pounds Temp:     98.4 degrees F oral BP sitting:   120 / 80  (right arm) Cuff size:   regular  Vitals Entered By: Duard Brady LPN (August 25, 2010 1:44 PM) CC: c/o insect bites to both feet - ?getting worse? Is Patient Diabetic? No   CC:  c/o insect bites to both feet - ?getting worse?Marland Kitchen  History of Present Illness: 65 -year-old patient who presents with a two-month history of a rash involving his feet.  He states that he sustained a number of insect bites about two months ago and he still has some persistent lesions with the itching.  He is treated dyslipidemia.  He has gastroesophageal reflux disease and a history of allergic rhinitis otherwise she has been stable.  He is noted to have some gynecomastia today which he states has been present for years since discontinuation of tobacco  Allergies (verified): No Known Drug Allergies  Past History:  Past Medical History: Reviewed history from 09/25/2009 and no changes required. Colonic polyps, hx of GERD Benign prostatic hypertrophy Allergic rhinitis/septal deviation  Physical Exam  General:  Well-developed,well-nourished,in no acute distress; alert,appropriate and cooperative throughout examination Breasts:  mild gynecomastia Skin:  scattered areas of patchy dry thickened skin present, most marked over the dorsal aspect of the feet and ankles; they have one resolving ulcer with the dry skin over the right plantar surface.  This was clean and nondraining   Impression & Recommendations:  Problem # 1:  LEG EDEMA, RIGHT (ICD-782.3) this has resolved, and was noted to be related to a calf hematoma  Problem # 2:  DERMATITIS (ICD-692.9)  His updated medication list for this problem includes:    Fexofenadine Hcl 180 Mg Tabs (Fexofenadine hcl) .Marland Kitchen... 1 once daily    Triamcinolone Acetonide 0.1 % Crea (Triamcinolone acetonide)  ..... Use twice daily  Complete Medication List: 1)  Fluticasone Propionate 50 Mcg/act Susp (Fluticasone propionate) .... Spray 1 puff into both  nostrils once a day 2)  Simvastatin 20 Mg Tabs (Simvastatin) .Marland Kitchen.. 1 once daily 3)  Fexofenadine Hcl 180 Mg Tabs (Fexofenadine hcl) .Marland Kitchen.. 1 once daily 4)  Flomax 0.4 Mg Xr24h-cap (Tamsulosin hcl) .... Take one tab once daily 5)  Triamcinolone Acetonide 0.1 % Crea (Triamcinolone acetonide) .... Use twice daily  Patient Instructions: 1)  Please schedule a follow-up appointment in 6 months for CPX  2)  Advised not to eat any food or drink any liquids after 10 PM the night before your procedure. 3)  Avoid foods high in acid (tomatoes, citrus juices, spicy foods). Avoid eating within two hours of lying down or before exercising. Do not over eat; try smaller more frequent meals. Elevate head of bed twelve inches when sleeping. 4)  It is important that you exercise regularly at least 20 minutes 5 times a week. If you develop chest pain, have severe difficulty breathing, or feel very tired , stop exercising immediately and seek medical attention. Prescriptions: TRIAMCINOLONE ACETONIDE 0.1 % CREA (TRIAMCINOLONE ACETONIDE) use twice daily  #30 gm x 2   Entered and Authorized by:   Gordy Savers  MD   Signed by:   Gordy Savers  MD on 08/25/2010   Method used:   Electronically to        [facility]* (retail)       [phone] [place], [place]  [address]       Ph: [phone] or [phone]       Fax: [phone]   RxID:   [id number]    Orders Added: 1)  Est. Patient Level III [81829]

## 2010-11-18 NOTE — Progress Notes (Signed)
  Phone Note Call from Patient Call back at Home Phone [phone]   Caller: Patient Call For: Gordy Savers  MD Summary of Call: Pt states the cream that he is using for the insect bites is not helping. Initial call taken by: [facility] CMA AAMA,  September 03, 2010 1:24 PM  Follow-up for Phone Call         a new  RX called in - please notify patient Follow-up by: Gordy Savers  MD,  September 04, 2010 12:31 PM    New/Updated Medications: BETAMETHASONE DIPROPIONATE AUG 0.05 % CREA (BETAMETHASONE DIPROPIONATE AUG) use twice daily Prescriptions: BETAMETHASONE DIPROPIONATE AUG 0.05 % CREA (BETAMETHASONE DIPROPIONATE AUG) use twice daily  #1 tube x 1   Entered by:   Lynann Beaver CMA AAMA   Authorized by:   Gordy Savers  MD   Signed by:   Lynann Beaver CMA AAMA on 09/04/2010   Method used:   Electronically to        [facility]* (retail)       [address] [place], [place]  [address]       Ph: [phone] or [phone]       Fax: [phone]   RxID:   [id number]

## 2010-11-18 NOTE — Assessment & Plan Note (Signed)
Summary: SINUSITIS // RS   Vital Signs:  Patient profile:   65 year old male Weight:      170 pounds BMI:     23.14 Temp:     97.5 degrees F oral BP sitting:   100 / 70  (right arm) Cuff size:   regular  Vitals Entered By: Raechel Ache, RN (November 19, 2009 1:48 PM) CC: still with sinus problems , blood with blowing nose    CC:  still with sinus problems  and blood with blowing nose .  History of Present Illness: 65 year old patient who has a history of chronic sinus congestion, and a septal deviation;  he presents with a 3 to 4-week history of increasing sinus pressure purulent and bloody drainage down the right nares.  There is been some low-grade fever, sinus fullness.  He has been using fluticasone sparingly, but no decongestants.  Denies obvious fever, but he feels unwell  Allergies: No Known Drug Allergies  Past History:  Past Medical History: Reviewed history from 09/25/2009 and no changes required. Colonic polyps, hx of GERD Benign prostatic hypertrophy Allergic rhinitis/septal deviation  Review of Systems       The patient complains of anorexia, hoarseness, and prolonged cough.  The patient denies fever, weight loss, weight gain, vision loss, decreased hearing, chest pain, syncope, dyspnea on exertion, peripheral edema, headaches, hemoptysis, abdominal pain, melena, hematochezia, severe indigestion/heartburn, hematuria, incontinence, genital sores, muscle weakness, suspicious skin lesions, transient blindness, difficulty walking, depression, unusual weight change, abnormal bleeding, enlarged lymph nodes, angioedema, breast masses, and testicular masses.    Physical Exam  General:  Well-developed,well-nourished,in no acute distress; alert,appropriate and cooperative throughout examination Head:  Normocephalic and atraumatic without obvious abnormalities. No apparent alopecia or balding. Eyes:  No corneal or conjunctival inflammation noted. EOMI. Perrla. Funduscopic  exam benign, without hemorrhages, exudates or papilledema. Vision grossly normal. Ears:  External ear exam shows no significant lesions or deformities.  Otoscopic examination reveals clear canals, tympanic membranes are intact bilaterally without bulging, retraction, inflammation or discharge. Hearing is grossly normal bilaterally. Nose:  mucosal edema and airflow obstruction.   Mouth:  Oral mucosa and oropharynx without lesions or exudates.  Teeth in good repair. Neck:  No deformities, masses, or tenderness noted. Lungs:  Normal respiratory effort, chest expands symmetrically. Lungs are clear to auscultation, no crackles or wheezes. Heart:  Normal rate and regular rhythm. S1 and S2 normal without gallop, murmur, click, rub or other extra sounds.   Impression & Recommendations:  Problem # 1:  SINUSITIS- ACUTE-NOS (ICD-461.9)  His updated medication list for this problem includes:    Fluticasone Propionate 50 Mcg/act Susp (Fluticasone propionate) ..... Spray 1 puff into both  nostrils once a day    Hydrocodone-homatropine 5-1.5 Mg/42ml Syrp (Hydrocodone-homatropine) .Marland Kitchen... 1 teaspoon every 6 hours as needed for cough or congestion    Avelox 400 Mg Tabs (Moxifloxacin hcl) ..... One daily  Problem # 2:  ALLERGIC RHINITIS (ICD-477.9)  His updated medication list for this problem includes:    Fluticasone Propionate 50 Mcg/act Susp (Fluticasone propionate) ..... Spray 1 puff into both  nostrils once a day    Fexofenadine Hcl 180 Mg Tabs (Fexofenadine hcl) .Marland Kitchen... 1 once daily  Complete Medication List: 1)  Fluticasone Propionate 50 Mcg/act Susp (Fluticasone propionate) .... Spray 1 puff into both  nostrils once a day 2)  Simvastatin 20 Mg Tabs (Simvastatin) .Marland Kitchen.. 1 once daily 3)  Fexofenadine Hcl 180 Mg Tabs (Fexofenadine hcl) .Marland Kitchen.. 1 once daily 4)  Flomax 0.4 Mg Xr24h-cap (Tamsulosin hcl) .... Take one tab once daily 5)  Triamcinolone Acetonide 0.1 % Oint (Triamcinolone acetonide) .... Use twice  daily 6)  Prednisone 10 Mg Tabs (Prednisone) .... One twice daily 7)  Hydrocodone-homatropine 5-1.5 Mg/86ml Syrp (Hydrocodone-homatropine) .Marland Kitchen.. 1 teaspoon every 6 hours as needed for cough or congestion 8)  Avelox 400 Mg Tabs (Moxifloxacin hcl) .... One daily  Patient Instructions: 1)  Use warm moist compresses, and over the counter decongestants ( only as directed). Call if no improvement in 5-7 days, sooner if increasing pain, fever, or new symptoms. 2)  Take your antibiotic as prescribed until ALL of it is gone, but stop if you develop a rash or swelling and contact our office as soon as possible.

## 2010-11-18 NOTE — Progress Notes (Signed)
Summary: needs rx for generic flomax faxed to [facility]   Phone Note Call from Patient Call back at Home Phone [phone]   Caller: pt live Call For: k Summary of Call: flomax generic [facility]  Initial call taken by: Roselle Locus,  January 22, 2009 4:41 PM    New/Updated Medications: FLOMAX 0.4 MG XR24H-CAP (TAMSULOSIN HCL) take one tab once daily   Prescriptions: FLOMAX 0.4 MG XR24H-CAP (TAMSULOSIN HCL) take one tab once daily  #90 x 3   Entered by:   Kern Reap CMA   Authorized by:   Gordy Savers  MD   Signed by:   Kern Reap CMA on 01/22/2009   Method used:   Electronically to        [facility]* (mail-order)       [address] [place], [place]  [address]       Ph: [phone]       Fax: [phone]   RxID:   [id number]

## 2010-11-18 NOTE — Miscellaneous (Signed)
  Medications Added FEXOFENADINE HCL 180 MG  TABS (FEXOFENADINE HCL) 1 once daily       Clinical Lists Changes  Medications: Changed medication from FEXOFENADINE HCL 180 MG  TABS (FEXOFENADINE HCL) to FEXOFENADINE HCL 180 MG  TABS (FEXOFENADINE HCL) 1 once daily

## 2010-11-18 NOTE — Procedures (Signed)
Summary: Colonoscopy Report/[facility]  Colonoscopy Report/[facility]   Imported By: Maryln Gottron 11/07/2009 10:08:21  _____________________________________________________________________  External Attachment:    Type:   Image     Comment:   External Document

## 2010-11-18 NOTE — Progress Notes (Signed)
Summary: Pt needs to know if he can take Avelox and Augmentin  Phone Note Call from Patient Call back at Home Phone [phone]   Caller: Patient Summary of Call: Pt was prescribed and antibiotic from Dr. Amador Cunas called Avelox. Pt fell and cut his hand. Pt went to [facility] and was prescribed Augmentin 125mg  tabs 1 two times a day for 7 days. Pt is wondering if he needs to discontinue the Avelox or not.  Initial call taken by: Lucy Antigua,  November 22, 2009 4:50 PM  Follow-up for Phone Call        Phone Call Completed Follow-up by: Raechel Ache, RN,  November 22, 2009 5:11 PM    OK to take either antibiotic but not both (continue avelox)

## 2010-11-18 NOTE — Assessment & Plan Note (Signed)
Summary: fup bites//ccm   Vital Signs:  Patient profile:   65 year old male Weight:      172 pounds Temp:     98.7 degrees F oral BP sitting:   118 / 70  (right arm) Cuff size:   regular  Vitals Entered By: Duard Brady LPN (September 04, 2010 12:56 PM) CC: f/u on bites on feet - no improvement Is Patient Diabetic? No   CC:  f/u on bites on feet - no improvement.  History of Present Illness: 65 year old patient who is seen today for follow-up of the foot dermatitis.  This began approximately 3 months ago.  He felt that perhaps he sustained some insect bites.  On further questioning, it seems that over the years.  He has had similar episodes of dermatitis.  These apparently started as small vesicles that usually heal.  He has had lesions on his feet for 3 months that have failed to heal.  He was seen here 10 days ago and given a prescription for triamcinolone but apparently nystatin was dispensed at the drug store.    Allergies (verified): No Known Drug Allergies  Physical Exam  General:  Well-developed,well-nourished,in no acute distress; alert,appropriate and cooperative throughout examination Skin:  patient has a scaly, superficial ulcer involving the plantar aspect of his right foot;  appear to be noninflammatory without any erythema; he had patchy areas of scaly hyperpigmented dermatitis involving the dorsal aspect of both feet.  The lesion on the left was approximately 1 cm and 3 cm on the right   Impression & Recommendations:  Problem # 1:  DERMATITIS (ICD-692.9)  His updated medication list for this problem includes:    Fexofenadine Hcl 180 Mg Tabs (Fexofenadine hcl) .Marland Kitchen... 1 once daily    Triamcinolone Acetonide 0.1 % Crea (Triamcinolone acetonide) ..... Use twice daily    Betamethasone Dipropionate Aug 0.05 % Crea (Betamethasone dipropionate aug) ..... Use twice daily will give a trial  of topical steroids;  if ineffective will refer to dermatology  His updated  medication list for this problem includes:    Fexofenadine Hcl 180 Mg Tabs (Fexofenadine hcl) .Marland Kitchen... 1 once daily    Triamcinolone Acetonide 0.1 % Crea (Triamcinolone acetonide) ..... Use twice daily    Betamethasone Dipropionate Aug 0.05 % Crea (Betamethasone dipropionate aug) ..... Use twice daily  Complete Medication List: 1)  Fluticasone Propionate 50 Mcg/act Susp (Fluticasone propionate) .... Spray 1 puff into both  nostrils once a day 2)  Simvastatin 20 Mg Tabs (Simvastatin) .Marland Kitchen.. 1 once daily 3)  Fexofenadine Hcl 180 Mg Tabs (Fexofenadine hcl) .Marland Kitchen.. 1 once daily 4)  Flomax 0.4 Mg Xr24h-cap (Tamsulosin hcl) .... Take one tab once daily 5)  Triamcinolone Acetonide 0.1 % Crea (Triamcinolone acetonide) .... Use twice daily 6)  Betamethasone Dipropionate Aug 0.05 % Crea (Betamethasone dipropionate aug) .... Use twice daily  Patient Instructions: 1)  topical cream as directed 2)  call for dermatology referral if unimproved Prescriptions: BETAMETHASONE DIPROPIONATE AUG 0.05 % CREA (BETAMETHASONE DIPROPIONATE AUG) use twice daily  #1 tube x 1   Entered and Authorized by:   Gordy Savers  MD   Signed by:   Gordy Savers  MD on 09/04/2010   Method used:   Print then Give to Patient   RxID:   [id number]    Orders Added: 1)  Est. Patient Level II [62952]

## 2010-11-18 NOTE — Assessment & Plan Note (Signed)
Summary: CONGESTION // RS   Vital Signs:  Patient profile:   65 year old male Weight:      170 pounds Temp:     98.6 degrees F oral BP sitting:   118 / 76  (left arm) Cuff size:   regular  Vitals Entered By: Raechel Ache, RN (October 25, 2009 11:03 AM) CC: C/o sinus congestion and tickly cough   CC:  C/o sinus congestion and tickly cough.  History of Present Illness: 65 year old patient has a history of allergic rhinitis, as well as a nasal septal deviation.  He was to one month ago for a flare of his allergic rhinitis and more recently has had increasing congestion and cough, rhinorrhea.  Denies any fever or sinus pain.  No purulent sinus drainage.  He has been on his maintenance antihistamine and nasal steroid.  Allergies: No Known Drug Allergies  Past History:  Past Medical History: Reviewed history from 09/25/2009 and no changes required. Colonic polyps, hx of GERD Benign prostatic hypertrophy Allergic rhinitis/septal deviation  Review of Systems  The patient denies anorexia, fever, weight loss, weight gain, vision loss, decreased hearing, hoarseness, chest pain, syncope, dyspnea on exertion, peripheral edema, prolonged cough, headaches, hemoptysis, abdominal pain, melena, hematochezia, severe indigestion/heartburn, hematuria, incontinence, genital sores, muscle weakness, suspicious skin lesions, transient blindness, difficulty walking, depression, unusual weight change, abnormal bleeding, enlarged lymph nodes, angioedema, breast masses, and testicular masses.    Physical Exam  General:  Well-developed,well-nourished,in no acute distress; alert,appropriate and cooperative throughout examination Head:  Normocephalic and atraumatic without obvious abnormalities. No apparent alopecia or balding. Eyes:  No corneal or conjunctival inflammation noted. EOMI. Perrla. Funduscopic exam benign, without hemorrhages, exudates or papilledema. Vision grossly normal. Ears:  External  ear exam shows no significant lesions or deformities.  Otoscopic examination reveals clear canals, tympanic membranes are intact bilaterally without bulging, retraction, inflammation or discharge. Hearing is grossly normal bilaterally. Nose:  nasal dischargemucosal pallor.  nasal dischargemucosal pallor.   Mouth:  Oral mucosa and oropharynx without lesions or exudates.  Teeth in good repair. Neck:  No deformities, masses, or tenderness noted. Lungs:  Normal respiratory effort, chest expands symmetrically. Lungs are clear to auscultation, no crackles or wheezes.   Impression & Recommendations:  Problem # 1:  URI (ICD-465.9)  His updated medication list for this problem includes:    Fexofenadine Hcl 180 Mg Tabs (Fexofenadine hcl) .Marland Kitchen... 1 once daily    Hydrocodone-homatropine 5-1.5 Mg/56ml Syrp (Hydrocodone-homatropine) .Marland Kitchen... 1 teaspoon every 6 hours as needed for cough or congestion  His updated medication list for this problem includes:    Fexofenadine Hcl 180 Mg Tabs (Fexofenadine hcl) .Marland Kitchen... 1 once daily    Hydrocodone-homatropine 5-1.5 Mg/29ml Syrp (Hydrocodone-homatropine) .Marland Kitchen... 1 teaspoon every 6 hours as needed for cough or congestion  Problem # 2:  ALLERGIC RHINITIS (ICD-477.9)  His updated medication list for this problem includes:    Fluticasone Propionate 50 Mcg/act Susp (Fluticasone propionate) ..... Spray 1 puff into both  nostrils once a day    Fexofenadine Hcl 180 Mg Tabs (Fexofenadine hcl) .Marland Kitchen... 1 once daily  His updated medication list for this problem includes:    Fluticasone Propionate 50 Mcg/act Susp (Fluticasone propionate) ..... Spray 1 puff into both  nostrils once a day    Fexofenadine Hcl 180 Mg Tabs (Fexofenadine hcl) .Marland Kitchen... 1 once daily  Complete Medication List: 1)  Fluticasone Propionate 50 Mcg/act Susp (Fluticasone propionate) .... Spray 1 puff into both  nostrils once a day 2)  Simvastatin 20 Mg Tabs (Simvastatin) .Marland Kitchen.. 1 once daily 3)  Fexofenadine Hcl 180 Mg  Tabs (Fexofenadine hcl) .Marland Kitchen.. 1 once daily 4)  Flomax 0.4 Mg Xr24h-cap (Tamsulosin hcl) .... Take one tab once daily 5)  Triamcinolone Acetonide 0.1 % Oint (Triamcinolone acetonide) .... Use twice daily 6)  Prednisone 10 Mg Tabs (Prednisone) .... One twice daily 7)  Hydrocodone-homatropine 5-1.5 Mg/82ml Syrp (Hydrocodone-homatropine) .Marland Kitchen.. 1 teaspoon every 6 hours as needed for cough or congestion  Patient Instructions: 1)  Get plenty of rest, drink lots of clear liquids, and use Tylenol or Ibuprofen for fever and comfort. Return in 7-10 days if you're not better:sooner if you're feeling worse. Prescriptions: HYDROCODONE-HOMATROPINE 5-1.5 MG/5ML SYRP (HYDROCODONE-HOMATROPINE) 1 teaspoon every 6 hours as needed for cough or congestion  #6 oz x 0   Entered and Authorized by:   Gordy Savers  MD   Signed by:   Gordy Savers  MD on 10/25/2009   Method used:   Print then Give to Patient   RxID:   [id number] PREDNISONE 10 MG TABS (PREDNISONE) one twice daily  #14 x 0   Entered and Authorized by:   Gordy Savers  MD   Signed by:   Gordy Savers  MD on 10/25/2009   Method used:   Print then Give to Patient   RxID:   [id number] SIMVASTATIN 20 MG  TABS (SIMVASTATIN) 1 once daily  #90 x 6   Entered and Authorized by:   Gordy Savers  MD   Signed by:   Gordy Savers  MD on 10/25/2009   Method used:   Print then Give to Patient   RxID:   [id number]

## 2010-11-18 NOTE — Assessment & Plan Note (Signed)
Summary: right lower leg swollen//lch   Vital Signs:  Patient profile:   65 year old male Weight:      175 pounds Temp:     98.1 degrees F oral BP sitting:   110 / 70  (right arm) Cuff size:   regular  Vitals Entered By: Duard Brady LPN (July 30, 2010 9:25 AM) CC: c/o (R) lower leg/calf swelling  also c/o insect bites to foot Is Patient Diabetic? No   CC:  c/o (R) lower leg/calf swelling  also c/o insect bites to foot.  History of Present Illness: 65 year old patient who is seen today with a chief complaint of swelling in the right calf area.  He sustained some significant trauma to the calf  about one month ago.  It was quite painful for a bit, but he has had persistent swelling and pitting edema involving the right lower leg.  He denies any pulmonary symptoms.  He does have additional allergic rhinitis, which has been stable on his present regimen.  Allergies (verified): No Known Drug Allergies  Past History:  Past Medical History: Reviewed history from 09/25/2009 and no changes required. Colonic polyps, hx of GERD Benign prostatic hypertrophy Allergic rhinitis/septal deviation  Review of Systems       The patient complains of peripheral edema.  The patient denies anorexia, fever, weight loss, weight gain, vision loss, decreased hearing, hoarseness, chest pain, syncope, dyspnea on exertion, prolonged cough, headaches, hemoptysis, abdominal pain, melena, hematochezia, severe indigestion/heartburn, hematuria, incontinence, genital sores, muscle weakness, suspicious skin lesions, transient blindness, difficulty walking, depression, unusual weight change, abnormal bleeding, enlarged lymph nodes, angioedema, breast masses, and testicular masses.    Physical Exam  General:  Well-developed,well-nourished,in no acute distress; alert,appropriate and cooperative throughout examination Msk:  milder right calf tenderness or pitting edema involving the right lower leg distal to  the knee, but sparing the ankle and foot   Impression & Recommendations:  Problem # 1:  LEG EDEMA, RIGHT (ICD-782.3)  Orders: Vascular Clinic (Vascular)  Problem # 2:  ALLERGIC RHINITIS (ICD-477.9)  His updated medication list for this problem includes:    Fluticasone Propionate 50 Mcg/act Susp (Fluticasone propionate) ..... Spray 1 puff into both  nostrils once a day    Fexofenadine Hcl 180 Mg Tabs (Fexofenadine hcl) .Marland Kitchen... 1 once daily  His updated medication list for this problem includes:    Fluticasone Propionate 50 Mcg/act Susp (Fluticasone propionate) ..... Spray 1 puff into both  nostrils once a day    Fexofenadine Hcl 180 Mg Tabs (Fexofenadine hcl) .Marland Kitchen... 1 once daily  Complete Medication List: 1)  Fluticasone Propionate 50 Mcg/act Susp (Fluticasone propionate) .... Spray 1 puff into both  nostrils once a day 2)  Simvastatin 20 Mg Tabs (Simvastatin) .Marland Kitchen.. 1 once daily 3)  Fexofenadine Hcl 180 Mg Tabs (Fexofenadine hcl) .Marland Kitchen.. 1 once daily 4)  Flomax 0.4 Mg Xr24h-cap (Tamsulosin hcl) .... Take one tab once daily  Other Orders: Flu Vaccine 6yrs + (16109) Admin 1st Vaccine (60454)  Patient Instructions: 1)  venous ultrasound the right leg today 2)  Limit your Sodium (Salt). 3)  complete annual exam 6 months Prescriptions: FLOMAX 0.4 MG XR24H-CAP (TAMSULOSIN HCL) take one tab once daily  #90 x 3   Entered and Authorized by:   Gordy Savers  MD   Signed by:   Gordy Savers  MD on 07/30/2010   Method used:   Electronically to        [facility] (retail)  [address]       [place], [place]  [address]       Ph: [phone]       Fax: [phone]   RxID:   [id number] SIMVASTATIN 20 MG  TABS (SIMVASTATIN) 1 once daily  #90 x 4   Entered and Authorized by:   Gordy Savers  MD   Signed by:   Gordy Savers  MD on 07/30/2010   Method used:   Electronically to        [facility] (retail)       [address]       [place], [place]  [address]       Ph: [phone]       Fax: [phone]   RxID:   [id number] FLUTICASONE PROPIONATE 50 MCG/ACT SUSP (FLUTICASONE PROPIONATE) Spray 1 puff into both  nostrils once a day  #6 x 6   Entered and Authorized by:   Gordy Savers  MD   Signed by:   Gordy Savers  MD on 07/30/2010   Method used:   Electronically to        [facility] (retail)       [address]       [place], [place]  [address]       Ph: [phone]       Fax: [phone]   RxID:   [id number]    Immunizations Administered:  Influenza Vaccine # 1:    Vaccine Type: Fluvax 3+    Site: left deltoid    Mfr: Gl[employer]oSmithK[employer]    Dose: 0.5 ml    Route: IM    Given by: Duard Brady LPN    Exp. Date: 04/18/2011    Lot #: [id number]    VIS given: 05/13/10 version given July 30, 2010.    Physician counseled: yes  Flu Vaccine Consent Questions:    Do you have a history of severe allergic reactions to this vaccine? no    Any prior history of allergic reactions to egg and/or gelatin? no    Do you have a sensitivity to the preservative Thimersol? no    Do you have a past history of Guillan-Barre Syndrome? no    Do you currently have an acute febrile illness? no    Have you ever had a severe reaction to latex? no    Vaccine information given and explained to patient? yes   Appended Document: right lower leg swollen//lch pt called per Dr. Amador Cunas - test reveled no blood clot - hematoma will resolve over time. KIK

## 2010-11-18 NOTE — Miscellaneous (Signed)
Summary: Orders Update  Clinical Lists Changes  Orders: Added new Test order of Venous Duplex Lower Extremity (Venous Duplex Lower) - Signed 

## 2010-11-18 NOTE — Procedures (Signed)
Summary: Upper Endoscopy Report/[facility]  Upper Endoscopy Report/[facility]   Imported By: Maryln Gottron 11/07/2009 10:15:27  _____________________________________________________________________  External Attachment:    Type:   Image     Comment:   External Document

## 2010-11-20 NOTE — Progress Notes (Signed)
Summary: Pt req call to go over lab results  Phone Note Call from Patient Call back at Home Phone [phone]   Caller: Patient Summary of Call: Pt called and would like to go over his psa results and all labs. Pls call back asap.  Initial call taken by: Lucy Antigua,  October 27, 2010 9:06 AM  Follow-up for Phone Call        CALLED_ PATIENT TO COME BY IN AM FOR PSA Follow-up by: Gordy Savers  MD,  October 27, 2010 5:54 PM  Additional Follow-up for Phone Call Additional follow up Details #1::        PT IS [facility] FOR 07-29-2010 10.45A Additional Follow-up by: Heron Sabins,  October 28, 2010 8:12 AM

## 2010-11-20 NOTE — Letter (Signed)
Summary: [facility]  [facility]   Imported By: Lennie Odor 10/07/2010 15:37:27  _____________________________________________________________________  External Attachment:    Type:   Image     Comment:   External Document

## 2010-11-20 NOTE — Assessment & Plan Note (Signed)
Summary: pt will come in fasting/njr   Vital Signs:  Patient profile:   65 year old male Height:      72 inches Weight:      171 pounds BMI:     23.28 Temp:     97.8 degrees F oral BP sitting:   122 / 70  (right arm) Cuff size:   regular  Vitals Entered By: Duard Brady LPN (September 26, 2010 9:41 AM) CC: cpx - doing well Is Patient Diabetic? No   CC:  cpx - doing well.  History of Present Illness: 8 -year-old patient who is seen today for wellness examand he does quite well.  He does have a history of allergic rhinitis BPH gastroesophageal reflux disease.  He has colonic polyps and did have a follow-up colonoscopy in January of this year.  He continues to be bothered by a dermatitis involving primarily the distal lower extremities and feet.  It began as a small blister and pustules, but is in response to topical steroids.  It is felt that he probably has eczema.  Here for Medicare AWV:  1.   Risk factors based on Past M, S, F history:a vascular risk factors include dyslipidemia 2.   Physical Activities: remains quite active without exercise limitations 3.   Depression/mood: noticed her depression and mood disorder 4.   Hearing: no hearing deficits 5.   ADL's: independent in all aspects of daily living 6.   Fall Risk: low 7.   Home Safety: no problems identified 8.   Height, weight, &visual acuity:height and weight stable.  No difficulty with visual acuity 9.   Counseling: heart healthy diet, salt restricted diet.  All encouraged 10.   Labs ordered based on risk factors:laboratory  profile, including lipid panel will be reviewed 11.           Referral Coordination- not appropriate at this time.  will need  colonoscopy in 4 years 12.           Care Plan- heart healthy diet more regular and vigorous exercise.  Encouraged 13.            Cognitive Assessment- alert and oriented, with normal affect    Allergies (verified): No Known Drug Allergies  Past History:  Past  Medical History: Reviewed history from 09/25/2009 and no changes required. Colonic polyps, hx of GERD Benign prostatic hypertrophy Allergic rhinitis/septal deviation  Past Surgical History: Reviewed history from 09/25/2009 and no changes required. Rotator cuff repair Vasectomy  colonoscopy November 2005, January 2011 Dr. Loreta Ave stress Myoview April 2009  Family History: Reviewed history from 09/26/2007 and no changes required. father died at 81, lung cancer mother died at 26.  CAD 5 brothers, positive diabetes two sisters positive for hypertension  Social History: Reviewed history from 09/26/2007 and no changes required. Married 3 daughters  Review of Systems       The patient complains of suspicious skin lesions.  The patient denies anorexia, fever, weight loss, weight gain, vision loss, decreased hearing, hoarseness, chest pain, syncope, dyspnea on exertion, peripheral edema, prolonged cough, headaches, hemoptysis, abdominal pain, melena, hematochezia, severe indigestion/heartburn, hematuria, incontinence, genital sores, muscle weakness, transient blindness, difficulty walking, depression, unusual weight change, abnormal bleeding, enlarged lymph nodes, angioedema, breast masses, and testicular masses.    Physical Exam  General:  Well-developed,well-nourished,in no acute distress; alert,appropriate and cooperative throughout examination Head:  Normocephalic and atraumatic without obvious abnormalities. No apparent alopecia or balding. Eyes:  No corneal or conjunctival inflammation noted. EOMI.  Perrla. Funduscopic exam benign, without hemorrhages, exudates or papilledema. Vision grossly normal. Nose:  External nasal examination shows no deformity or inflammation. Nasal mucosa are pink and moist without lesions or exudates. Mouth:  Oral mucosa and oropharynx without lesions or exudates.  Teeth in good repair. Neck:  No deformities, masses, or tenderness noted. Chest Wall:  No  deformities, masses, tenderness or gynecomastia noted. Breasts:  No masses or gynecomastia noted Lungs:  Normal respiratory effort, chest expands symmetrically. Lungs are clear to auscultation, no crackles or wheezes. Heart:  Normal rate and regular rhythm. S1 and S2 normal without gallop, murmur, click, rub or other extra sounds. Abdomen:  Bowel sounds positive,abdomen soft and non-tender without masses, organomegaly or hernias noted. Rectal:  No external abnormalities noted. Normal sphincter tone. No rectal masses or tenderness. Genitalia:  Testes bilaterally descended without nodularity, tenderness or masses. No scrotal masses or lesions. No penis lesions or urethral discharge. Prostate:  Prostate gland firm and smooth, no enlargement, nodularity, tenderness, mass, asymmetry or induration.1+ enlarged.  1+ enlarged.   Msk:  No deformity or scoliosis noted of thoracic or lumbar spine.   Pulses:  R and L carotid,radial,femoral,dorsalis pedis and posterior tibial pulses are full and equal bilaterally Extremities:  No clubbing, cyanosis, edema, or deformity noted with normal full range of motion of all joints.   Neurologic:  No cranial nerve deficits noted. Station and gait are normal. Plantar reflexes are down-going bilaterally. DTRs are symmetrical throughout. Sensory, motor and coordinative functions appear intact. Skin:  Intact without suspicious lesions or rashes Cervical Nodes:  No lymphadenopathy noted Axillary Nodes:  No palpable lymphadenopathy Inguinal Nodes:  No significant adenopathy Psych:  Cognition and judgment appear intact. Alert and cooperative with normal attention span and concentration. No apparent delusions, illusions, hallucinations   Impression & Recommendations:  Problem # 1:  HEALTH SCREENING (ICD-V70.0)  Orders: EKG w/ Interpretation (93000) Medicare -1st Annual Wellness Visit [phone]) Specimen Handling (60454) TLB-BMP (Basic Metabolic Panel-BMET)  (80048-METABOL) TLB-CBC Platelet - w/Differential (85025-CBCD) TLB-Hepatic/Liver Function Pnl (80076-HEPATIC)  Problem # 2:  HYPERCHOLESTEROLEMIA (ICD-272.0)  His updated medication list for this problem includes:    Simvastatin 20 Mg Tabs (Simvastatin) .Marland Kitchen... 1 once daily  Orders: Venipuncture (09811) TLB-Lipid Panel (80061-LIPID) TLB-BMP (Basic Metabolic Panel-BMET) (80048-METABOL) TLB-CBC Platelet - w/Differential (85025-CBCD) TLB-Hepatic/Liver Function Pnl (80076-HEPATIC) TLB-TSH (Thyroid Stimulating Hormone) (84443-TSH)  His updated medication list for this problem includes:    Simvastatin 20 Mg Tabs (Simvastatin) .Marland Kitchen... 1 once daily  Problem # 3:  ALLERGIC RHINITIS (ICD-477.9)  His updated medication list for this problem includes:    Fluticasone Propionate 50 Mcg/act Susp (Fluticasone propionate) ..... Spray 1 puff into both  nostrils once a day    Fexofenadine Hcl 180 Mg Tabs (Fexofenadine hcl) .Marland Kitchen... 1 once daily  Orders: TLB-BMP (Basic Metabolic Panel-BMET) (80048-METABOL) TLB-CBC Platelet - w/Differential (85025-CBCD) TLB-Hepatic/Liver Function Pnl (80076-HEPATIC)  His updated medication list for this problem includes:    Fluticasone Propionate 50 Mcg/act Susp (Fluticasone propionate) ..... Spray 1 puff into both  nostrils once a day    Fexofenadine Hcl 180 Mg Tabs (Fexofenadine hcl) .Marland Kitchen... 1 once daily  Complete Medication List: 1)  Fluticasone Propionate 50 Mcg/act Susp (Fluticasone propionate) .... Spray 1 puff into both  nostrils once a day 2)  Simvastatin 20 Mg Tabs (Simvastatin) .Marland Kitchen.. 1 once daily 3)  Fexofenadine Hcl 180 Mg Tabs (Fexofenadine hcl) .Marland Kitchen.. 1 once daily 4)  Flomax 0.4 Mg Xr24h-cap (Tamsulosin hcl) .... Take one tab once daily 5)  Triamcinolone  Acetonide 0.1 % Crea (Triamcinolone acetonide) .... Use twice daily 6)  Betamethasone Dipropionate Aug 0.05 % Crea (Betamethasone dipropionate aug) .... Use twice daily  Patient Instructions: 1)  Please  schedule a follow-up appointment in 1 year. 2)  Limit your Sodium (Salt). 3)  It is important that you exercise regularly at least 20 minutes 5 times a week. If you develop chest pain, have severe difficulty breathing, or feel very tired , stop exercising immediately and seek medical attention. Prescriptions: FLOMAX 0.4 MG XR24H-CAP (TAMSULOSIN HCL) take one tab once daily  #90 x 6   Entered and Authorized by:   Gordy Savers  MD   Signed by:   Gordy Savers  MD on 09/26/2010   Method used:   Electronically to        [id number] (retail)       [address]       [place], [place]  [address]       Ph: [phone] or [phone]       Fax: [phone]   RxID:   [id number] SIMVASTATIN 20 MG  TABS (SIMVASTATIN) 1 once daily  #90 x 6   Entered and Authorized by:   Gordy Savers  MD   Signed by:   Gordy Savers  MD on 09/26/2010   Method used:   Electronically to        [id number] ([facility]       [address]       [place], [place]  [address]       Ph: [phone] or [phone]       Fax: [phone]   RxID:   [id number] FLUTICASONE PROPIONATE 50 MCG/ACT SUSP (FLUTICASONE PROPIONATE) Spray 1 puff into both  nostrils once a day  #6 x 6   Entered and Authorized by:   Gordy Savers  MD   Signed by:   Gordy Savers  MD on 09/26/2010   Method used:   [facility]ronically to        [id number]       [address]       [place], [place]  [address]       Ph: [phone] or [phone]       Fax: [phone]   RxID:   [id number]    Orders Added: 1)  EKG w/ Interpretation [93000] 2)  Venipuncture [50932] 3)  Medicare -1st Annual Wellness Visit [G0438] 4)  Est. Patient Level III [67124] 5)  Specimen Handling [99000] 6)  TLB-Lipid Panel [80061-LIPID] 7)  TLB-BMP (Basic Metabolic Panel-BMET) [80048-METABOL] 8)  TLB-CBC Platelet -  w/Differential [85025-CBCD] 9)  TLB-Hepatic/Liver Function Pnl [80076-HEPATIC] 10)  TLB-TSH (Thyroid Stimulating Hormone) [58099-IPJ]

## 2010-11-20 NOTE — Progress Notes (Signed)
Summary: REQUEST FOR REFERRAL (BEFORE END OF YEAR????)  Phone Note Call from Patient   Caller: Patient   [phone]  or   [phone] Summary of Call: Pt would like a referral to a dermatologist for eval of rash..... pt would like to have an appt before the end of year due to insurance change.  Initial call taken by: Debbra Riding,  September 26, 2010 11:45 AM  Follow-up for Phone Call        please refer to dermatology as soon as possible Follow-up by: Gordy Savers  MD,  September 26, 2010 5:14 PM

## 2011-01-30 ENCOUNTER — Telehealth: Payer: Self-pay | Admitting: *Deleted

## 2011-01-30 NOTE — Telephone Encounter (Signed)
Checking the status of wellness form that was dropped off a week ago for dr to complete.

## 2011-01-30 NOTE — Telephone Encounter (Signed)
DO YOU REMEMBER THIS FORM?

## 2011-01-30 NOTE — Telephone Encounter (Signed)
I have not seen this form.  

## 2011-01-30 NOTE — Telephone Encounter (Signed)
Please see if this form went to sacn.

## 2011-02-04 ENCOUNTER — Telehealth: Payer: Self-pay

## 2011-02-04 NOTE — Telephone Encounter (Signed)
Please check on this form - see if it went to scan - original message sen 01/30/11 to check on

## 2011-02-04 NOTE — Telephone Encounter (Signed)
error 

## 2011-02-04 NOTE — Telephone Encounter (Signed)
Pt called and wants to know status of wellness form. Pt faxed this form around the first wk of April to Dr Amador Cunas. This form was suppose to be mailed or faxed back to pt when completed. Pls mail form back to pts home address. Pt would like to be called and informed when this is being mailed. If pt is required to come in for ov, pls let pt know.

## 2011-02-25 NOTE — Telephone Encounter (Signed)
Scanning could not locate form.  Please have patient request another form to be completed. (pt will not be charged)

## 2011-02-26 NOTE — Telephone Encounter (Signed)
Attempt to call - ans mach- left message that we have tried to locate form and have be unsucessful with the new system in place and scanning being backed up. If he would send another formed AttnKim - i will see that it is done that same day and fax it back myself. KIK

## 2011-03-03 ENCOUNTER — Telehealth: Payer: Self-pay

## 2011-03-03 NOTE — Telephone Encounter (Signed)
Called pt , informed faxed wellnes form and will mail original to home address. Conformation recv'd and sent also. Copy made for scanning. KIK

## 2011-03-03 NOTE — Discharge Summary (Signed)
NAMEHILBERT, BRIGGS                ACCOUNT NO.:  [id number]   MEDICAL RECORD NO.:  [id number]          PATIENT TYPE:  OBV   LOCATION:  3702                         FACILITY:  [facility]   PHYSICIAN:  Valetta Mole. Swords, MD    DATE OF BIRTH:  February 11, 1946   DATE OF ADMISSION:  01/16/2008  DATE OF DISCHARGE:  01/17/2008                               DISCHARGE SUMMARY   PRIMARY CARE PHYSICIAN:  Gordy Savers, MD   DISCHARGE DIAGNOSES:  1. Atypical chest pain, likely secondary to pleurisy.  2. Hypercholesteremia.  3. Gastroesophageal reflux disease.  4. Benign prostatic hypertrophy.   HISTORY OF PRESENT ILLNESS:  Mr. Pierpoint is a 65 year old male, admitted on  January 16, 2008, with a chief complaint of left-sided chest pain, which  have been present for 1 day.  He has a history of hyperlipidemia and  noted insidious onset of pain while shopping on the day prior to  admission, he described the pain as a deep ache radiating to his  axillary region, and it was worse with deep inspiration and some  movements.  He denied any associated diaphoresis, nausea, or vomiting.  He felt some shortness of breath secondary to pain.  He denied any  recent fever, cough, headache, dizziness, or abdominal pain.  He also  denied any lower extremity swelling or fatigue.  He was admitted for  further evaluation and treatment.   PAST MEDICAL HISTORY:  1. BPH.  2. GERD.  3. Left rotator cuff repair in 2006.  4. Hypercholesterolemia.  5. History of colon polyps.   COURSE AT HOSPITAL COURSE:  Atypical chest pain.  The patient was  admitted and underwent serial cardiac enzymes, all of which were within  normal limits.  EKG noted normal sinus rhythm.  His heart rate was  stable on telemetry.  He continues with some chest pain which is  exacerbated with inspiration.  He has had no fever and no elevated white  blood cell count.  His oxygen saturation is stable and his D-dimer is  within normal limits.  At this  time, we plan to initiate NSAIDs for  presumed pleurisy and we will ask cardiology to arrange an outpatient  Cardiolite.  He is to be continued on statin as prior to admission and  we will also start a baby aspirin.   LABORATORY DATA:  Laboratories at the time of discharge,  TSH is  pending, D-dimer 0.22, total cholesterol 154, LDL 79, HDL 55, BUN 10,  and creatinine 0.87.  Hemoglobin 15.2 and hematocrit 45.3.   DISPOSITION:  The patient will be discharged to home.  He is instructed  to call Dr. Amador Cunas should he develop fever over 101.  He is  instructed to return to the ER should he develop worsening chest pain or  shortness of breath.   DISCHARGE MEDICATIONS:  1. Prilosec OTC 20 mg p.o. daily.  2. Aspirin 81 mg p.o. daily.  3. Simvastatin 20 mg p.o. nightly.  4. Flomax 0.4 mg p.o. daily.   FOLLOWUP:  The patient is scheduled to follow up with Dr. Eleonore Chiquito  next week and the patient is scheduled for a Cardiolite on  January 20, 2008, with [facility] and follow up by office visit  with Dr. Charlton Haws.      Sandford Craze, NP      Valetta Mole. Swords, MD  Electronically Signed    MO/MEDQ  D:  01/17/2008  T:  01/17/2008  Job:  [id number]   cc:   Gordy Savers, MD

## 2011-03-03 NOTE — Assessment & Plan Note (Signed)
[facility]                            CARDIOLOGY OFFICE NOTE   NAME:Tagle, GORDAN GRELL                       MRN:          [id number]  DATE:02/13/2008                            DOB:          1946-08-31    Mr. Brandon Dixon is a new patient referred from the ER.  The patient had chest  pain.  He was seen in the emergency room recently.  Pain was atypical.  He ruled out for myocardial infarction and was referred here.   The patient had a stress Myoview on January 20, 2008 ordered by Dr.  Amador Cunas, who follows the patient.  It was normal with an EF of 70%.  His EKG that I got from [facility] was also normal.  In talking to the  patient, the pain is atypical.  It had a pleuritic component.  He was  treated with nonsteroidals and it improved about 24-36 hours after his  initial evaluation.  He has occasional catches in the left side of his  chest.  Again they sound musculoskeletal.  There is no associated  dyspnea.  He has not had diaphoresis, syncope, palpitations.   The patient seems to be improved.   His coronary risk factors include hypercholesterolemia, for which he is  on statin drugs.   REVIEW OF SYSTEMS:  He has some seasonal allergies.  Otherwise negative.   PAST MEDICAL HISTORY:  Relatively benign.  He has had multiple rotator  cuff surgeries in the left shoulder.  Otherwise, negative.  Hypercholesterolemia and prostatism on Flomax.   The patient is currently unemployed.  He used to work in [employer].  He is married.  He has 3 children.  He enjoys walking and  music. Patient doesn't drink and quit smoking in 94   FAMILY HISTORY:  Unremarkable and noncontributory.   The patient's medications include Flomax 0.4 mg a day and simvastatin 20  a day.   EXAM:  Remarkable for healthy-appearing middle-aged white male in no  distress.  Blood pressure 120/70, pulse is 78 and regular, afebrile, respiratory  rate 14.  HEENT:  Unremarkable.  Carotids  are normal without bruit, no lymphadenopathy, thyromegaly, JVP  elevation.  LUNGS:  Clear with good diaphragmatic motion.  No wheezing.  S1-S2 normal heart sounds, PMI normal.  ABDOMEN:  Benign.  Bowel sounds positive.  No AAA.  No tenderness, no  hepatosplenomegaly or hepatojugular reflux.  No bruit.  Distal pulses intact, no edema.  NEURO:  Nonfocal.  SKIN:  Warm and dry.  No muscular weakness.   EKG reviewed from [facility] is normal.  Stress Myoview reviewed.  The patient  exercised for about 9 minutes.  EKG was negative.  Myoview pictures were  normal.  EF was 70%.   Also reviewed his ER report and multiple labs done which were remarkable  for a hematocrit of 45.3.  Negative enzymes.   LFTs were also normal, potassium was 3.9.   IMPRESSION:  1. Atypical chest pain resolved.  Normal stress Myoview.  No need for      further workup.  2. Atypical chest pain.  Continue nonsteroidals.  Possible viral      pleurisy versus musculoskeletal pain, feel free to use Motrin or      Advil as needed.  3. Hyperlipidemia.  Continue simvastatin.  Follow up lipid and liver      profile in a year.  Liver tests normal during recent ER visit.  4. Prostatism.  Continue Flomax.  PSA on an every 23-month basis.      Follow up with Dr. Amador Cunas.   The patient will be seen on as-needed basis.     Noralyn Pick. Eden Emms, MD, [facility]  Electronically Signed    PCN/MedQ  DD: 02/13/2008  DT: 02/13/2008  Job #: [id number]

## 2011-03-06 NOTE — Assessment & Plan Note (Signed)
[facility]                            [place] OFFICE NOTE   NAME:Brandon Dixon, Brandon Dixon                       MRN:          [id number]  DATE:09/22/2006                            DOB:          [birth year]    HISTORY OF PRESENT ILLNESS:  A 65 year old gentleman seen today for an  annul exam.  He has history of BPH, former tobacco use, colonic polyps,  gastroesophageal reflux disease.  He has seen Dr. Rennis Chris in the past for  a left rotator cuff tear.  He is status post surgery and still has  chronic pain.  He is scheduled for orthopedic follow up next week.  He  does quite well on Flomax.  Last colonoscopy was in 2005.  He has had a  negative ETT in 2003.   FAMILY HISTORY:  Unchanged.  Positive for lung cancer, cardiac disease  and diabetes, as well as hypertension.   PHYSICAL EXAMINATION:  GENERAL:  A well-developed, thin, fit-appearing  male.  No acute distress.  VITAL SIGNS:  Blood pressure was in a low normal range.  SKIN:  Negative.  HEENT:  Fundi, ear, nose and throat clear.  NECK:  No bruits or adenopathy.  CHEST:  Clear.  CARDIOVASCULAR:  Normal heart sounds, no murmurs.  ABDOMEN:  Benign.  GU:  External genitalia unremarkable.  RECTAL:  Prostate was only minimally enlarged, +1-2 and benign.  Stool  heme negative.  EXTREMITIES:  Diminutive posterior tibial pulses, dorsalis pedis pulses  were full.  NEUROLOGICAL:  Negative.   IMPRESSION:  Chronic left shoulder pain, status post rotator cuff  repair, colonic polyps, BPH, gastroesophageal reflux disease.   DISPOSITION:  Laboratory uptake will be reviewed.  He will be rechecked  in one year.  Medically not changed.     Gordy Savers, MD  Electronically Signed    PFK/MedQ  DD: 09/22/2006  DT: 09/22/2006  Job #: [id number]

## 2011-03-06 NOTE — Op Note (Signed)
[name], Brandon Dixon                ACCOUNT NO.:  [id number]   MEDICAL RECORD NO.:  [id number]          PATIENT TYPE:  AMB   LOCATION:  DAY                          FACILITY:  [facility]   PHYSICIAN:  Georges Lynch. Gioffre, M.D.DATE OF BIRTH:  12/26/1945   DATE OF PROCEDURE:  06/10/2005  DATE OF DISCHARGE:                                 OPERATIVE REPORT   SURGEON:  Georges Lynch. Darrelyn Hillock, M.D.   ASSISTANT:  Nurse.   PREOPERATIVE DIAGNOSIS:  Partially frozen left shoulder after a previous  open decompression of his left shoulder.   POSTOPERATIVE DIAGNOSIS:  Partially frozen left shoulder after a previous  open decompression of his left shoulder.   OPERATION:  1.  Closed manipulation left shoulder under general anesthesia.  2.  Injection left shoulder with 1.5 mL  Depo-Medrol, 8 mL of 0.5%      Marcaine.   DESCRIPTION OF PROCEDURE:  Under general anesthesia, a closed gentle  manipulation of the left shoulder was carried out. Following this, I then  went ahead and injected him after a sterile prep into the left shoulder with  1.5 mL Depo-Medrol and 5 mL 0.5% Marcaine. The plan now is he is scheduled  for physical therapy starting tomorrow.           ______________________________  Georges Lynch Darrelyn Hillock, M.D.     RAG/MEDQ  D:  06/10/2005  T:  06/10/2005  Job:  [id number]

## 2011-03-06 NOTE — Op Note (Signed)
NAMEBRECKON, Brandon Dixon                ACCOUNT NO.:  [id number]   MEDICAL RECORD NO.:  [id number]          PATIENT TYPE:  AMB   LOCATION:  NESC                         FACILITY:  [facility]   PHYSICIAN:  Georges Lynch. Gioffre, M.D.DATE OF BIRTH:  1946/03/26   DATE OF PROCEDURE:  02/12/2005  DATE OF DISCHARGE:                                 OPERATIVE REPORT   SURGEON:  Dr. Darrelyn Hillock   ASSISTANT:  Terie Purser, PA-C   PREOPERATIVE DIAGNOSES:  1.  Torn rotator cuff tendon on the left.  2.  Severe impingement syndrome on the left shoulder.   POSTOPERATIVE DIAGNOSES:  1.  Torn rotator cuff tendon on the left.  2.  Severe impingement syndrome on the left shoulder.   OPERATION:  1.  Partial acromionectomy and acromioplasty, left shoulder.  2.  Repair of a torn rotator cuff tendon on the left utilizing a Restore      tendon graft with 2 Mitek sutures.   DESCRIPTION OF PROCEDURE:  Under general anesthesia, but prior to the  general anesthesia, the patient had an interscalene nerve block on the left.  He was taken back to surgery and under general anesthesia, a routine  orthopedic prep and draping of the left shoulder was carried out.  He was  given 1 g of IV Ancef.  The incision was made over the anterior aspect of  the left shoulder, bleeders identified and cauterized.  At this time, I  separated the deltoid tendon from the acromion.  He had a large overgrowth  of his acromion, and I noted at this time after excising a chronically  inflamed subdeltoid bursa that he had a hole in the tendon, and his tendon  was markedly thinned out at its attachment.  At this time, I protected the  tendon and utilized the oscillating saw and did a partial acromionectomy.  I  then utilized the bur to even out the undersurface of the acromion.  Following that, the remainder of the subdeltoid bursa was excised so I could  get good exposure of the tendon.  I then put two 4-pronged Mitek sutures in  the proximal humerus  and then utilized a Restore graft and sutured it in  place to the tendon and to the bone.  I put two drill holes in the acromion  and reattached the deltoid tendon by way of #1 Ethibond suture, and the  remaining part of the deltoid was closed with 0 Vicryl; subcu was closed  with 0 Vicryl, skin with metal staples.  A sterile Neosporin dressing was  applied, and he was placed in a shoulder immobilizer.      RAG/MEDQ  D:  02/12/2005  T:  02/12/2005  Job:  [id number]

## 2011-03-06 NOTE — Op Note (Signed)
Brandon Dixon, Brandon Dixon                ACCOUNT NO.:  [id number]   MEDICAL RECORD NO.:  [id number]          PATIENT TYPE:  AMB   LOCATION:  ENDO                         FACILITY:  [facility]   PHYSICIAN:  Anselmo Rod, M.D.  DATE OF BIRTH:  [birth year]   DATE OF PROCEDURE:  08/25/2004  DATE OF DISCHARGE:                                 OPERATIVE REPORT   PROCEDURE PERFORMED:  Screening colonoscopy.   ENDOSCOPIST:  Anselmo Rod, M.D.   INSTRUMENT USED:  Olympus video colonoscope.   INDICATION FOR PROCEDURE:  A 65 year old African-American male undergoing  screening colonoscopy.  The patient has a family history of colon cancer in  the father and has had a change in bowel habits with recently softer stools.   PREPROCEDURE PREPARATION:  Informed consent was procured from the patient.  The patient was fasted for eight hours prior to the procedure and prepped  with a bottle of magnesium citrate and a gallon of GoLYTELY the night prior  to the procedure.   PREPROCEDURE PHYSICAL:  VITAL SIGNS:  The patient had stable vital signs.  NECK:  Supple.  CHEST:  Clear to auscultation.  S1, S2 regular.  ABDOMEN:  Soft with normal bowel sounds.   DESCRIPTION OF PROCEDURE:  The patient was placed in the left lateral  decubitus position and sedated with 80 mg of Demerol and 8 mg of Versed in  slow incremental doses.  Once the patient was adequately sedate and  maintained on low-flow oxygen and continuous cardiac monitoring, the Olympus  video colonoscope was advanced from the rectum to the cecum.  The  appendiceal orifice and the ileocecal valve were clearly visualized and  photographed.  No masses or polyps were seen.  There was no evidence of  diverticulosis, and retroflexion in the rectum revealed no abnormalities.   IMPRESSION:  Normal colonoscopy up to the cecum.  No masses or polyps seen.   RECOMMENDATIONS:  1.  Continue a high-fiber diet with liberal fluid intake.  2.  Repeat colonoscopy in  the next five years unless the patient develops      any abnormal symptoms in the interim.  3.  Outpatient follow-up as the need arises in the future.       JNM/MEDQ  D:  08/25/2004  T:  08/25/2004  Job:  [id number]   cc:   Gordy Savers, M.D. [facility]

## 2011-03-19 ENCOUNTER — Other Ambulatory Visit: Payer: Self-pay

## 2011-03-19 MED ORDER — SIMVASTATIN 20 MG PO TABS: 20.0000 mg | ORAL_TABLET | Freq: Every evening | ORAL | Status: DC

## 2011-03-19 NOTE — Telephone Encounter (Signed)
Faxed back to [facility] via this computer

## 2011-06-15 ENCOUNTER — Encounter: Payer: Self-pay | Admitting: Internal Medicine

## 2011-06-15 ENCOUNTER — Ambulatory Visit (INDEPENDENT_AMBULATORY_CARE_PROVIDER_SITE_OTHER): Payer: BC Managed Care – PPO | Admitting: Internal Medicine

## 2011-06-15 DIAGNOSIS — J309 Allergic rhinitis, unspecified: Secondary | ICD-10-CM

## 2011-06-15 DIAGNOSIS — E7800 Pure hypercholesterolemia, unspecified: Secondary | ICD-10-CM

## 2011-06-15 DIAGNOSIS — K219 Gastro-esophageal reflux disease without esophagitis: Secondary | ICD-10-CM

## 2011-06-15 LAB — LIPID PANEL
Cholesterol: 184 mg/dL (ref 0–200)
HDL: 68.2 mg/dL (ref >39.00)
LDL Cholesterol: 102 mg/dL — ABNORMAL HIGH (ref 0–99)
Total CHOL/HDL Ratio: 3
Triglycerides: 68 mg/dL (ref 0.0–149.0)
VLDL: 13.6 mg/dL (ref 0.0–40.0)

## 2011-06-15 NOTE — Progress Notes (Signed)
  Subjective:    Patient ID: Brandon Dixon, male    DOB: 08-14-1946, 65 y.o.   MRN: [id number]  HPI  65 year old patient who is seen today for followup. He was seen here last year for a physical. He requires a current health provider screening form completion with the recent lipid profile this year. He remains on simvastatin 20 mg daily. He is doing quite well no concerns or complaints. Denies any cardiopulmonary complaints he does have a history of allergic rhinitis BPH and gastro-esophageal reflux disease.  A random blood sugar 97   Review of Systems  Constitutional: Negative for fever, chills, appetite change and fatigue.  HENT: Negative for hearing loss, ear pain, congestion, sore throat, trouble swallowing, neck stiffness, dental problem, voice change and tinnitus.   Eyes: Negative for pain, discharge and visual disturbance.  Respiratory: Negative for cough, chest tightness, wheezing and stridor.   Cardiovascular: Negative for chest pain, palpitations and leg swelling.  Gastrointestinal: Negative for nausea, vomiting, abdominal pain, diarrhea, constipation, blood in stool and abdominal distention.  Genitourinary: Negative for urgency, hematuria, flank pain, discharge, difficulty urinating and genital sores.  Musculoskeletal: Negative for myalgias, back pain, joint swelling, arthralgias and gait problem.  Skin: Negative for rash.  Neurological: Negative for dizziness, syncope, speech difficulty, weakness, numbness and headaches.  Hematological: Negative for adenopathy. Does not bruise/bleed easily.  Psychiatric/Behavioral: Negative for behavioral problems and dysphoric mood. The patient is not nervous/anxious.        Objective:   Physical Exam  Constitutional: He is oriented to person, place, and time. He appears well-developed.  HENT:  Head: Normocephalic.  Right Ear: External ear normal.  Left Ear: External ear normal.  Eyes: Conjunctivae and EOM are normal.  Neck: Normal range  of motion.  Cardiovascular: Normal rate and normal heart sounds.   Pulmonary/Chest: Breath sounds normal.  Abdominal: Bowel sounds are normal.  Musculoskeletal: Normal range of motion. He exhibits no edema and no tenderness.  Neurological: He is alert and oriented to person, place, and time.  Psychiatric: He has a normal mood and affect. His behavior is normal.          Assessment & Plan:    Dyslipidemia. We'll check a lipid profile Health maintenance. Random blood sugar 97 Allergic rhinitis. Gastroesophageal reflux disease  We'll see back for his annual physical in 6 months

## 2011-06-15 NOTE — Patient Instructions (Signed)
Limit your sodium (Salt) intake  Avoids foods high in acid such as tomatoes citrus juices, and spicy foods.  Avoid eating within two hours of lying down or before exercising.  Do not overheat.  Try smaller more frequent meals.  If symptoms persist, elevate the head of her bed 12 inches while sleeping.  Return in 6 months for follow-up

## 2011-07-13 LAB — COMPREHENSIVE METABOLIC PANEL
ALT: 28
AST: 33
Albumin: 4
Alkaline Phosphatase: 74
BUN: 10
CO2: 26
Calcium: 9.6
Chloride: 105
Creatinine, Ser: 0.87
GFR calc Af Amer: >60
GFR calc non Af Amer: >60
Glucose, Bld: 95
Potassium: 3.9
Sodium: 138
Total Bilirubin: 0.7
Total Protein: 7.4

## 2011-07-13 LAB — POCT CARDIAC MARKERS
CKMB, poc: 1.8
CKMB, poc: 1.9
CKMB, poc: 2
Myoglobin, poc: 62.9
Myoglobin, poc: 64.2
Myoglobin, poc: 72.3
Operator id: 257131
Operator id: 285841
Operator id: 285841
Troponin i, poc: <0.05
Troponin i, poc: <0.05
Troponin i, poc: <0.05

## 2011-07-13 LAB — URINALYSIS, ROUTINE W REFLEX MICROSCOPIC
Bilirubin Urine: NEGATIVE
Glucose, UA: NEGATIVE
Hgb urine dipstick: NEGATIVE
Ketones, ur: NEGATIVE
Nitrite: NEGATIVE
Protein, ur: NEGATIVE
Specific Gravity, Urine: 1.01
Urobilinogen, UA: 0.2
pH: 6.5

## 2011-07-13 LAB — CARDIAC PANEL(CRET KIN+CKTOT+MB+TROPI)
CK, MB: 2.6
CK, MB: 3.1
Relative Index: 1.2
Relative Index: 1.2
Total CK: 210
Total CK: 254 — ABNORMAL HIGH
Troponin I: 0.01
Troponin I: <0.01

## 2011-07-13 LAB — CBC
HCT: 45.3
Hemoglobin: 15.2
MCHC: 33.6
MCV: 91.1
Platelets: 239
RBC: 4.97
RDW: 13.2
WBC: 7.9

## 2011-07-13 LAB — DIFFERENTIAL
Basophils Absolute: 0.1
Basophils Relative: 1
Eosinophils Absolute: 0.4
Eosinophils Relative: 5
Lymphocytes Relative: 24
Lymphs Abs: 1.9
Monocytes Absolute: 0.5
Monocytes Relative: 7
Neutro Abs: 5
Neutrophils Relative %: 64

## 2011-07-13 LAB — LIPID PANEL
Cholesterol: 154
HDL: 55
LDL Cholesterol: 79
Total CHOL/HDL Ratio: 2.8
Triglycerides: 98
VLDL: 20

## 2011-07-13 LAB — D-DIMER, QUANTITATIVE: D-Dimer, Quant: 0.22

## 2011-07-13 LAB — PROTIME-INR
INR: 0.9
Prothrombin Time: 12.7

## 2011-07-13 LAB — TSH
TSH: 0.819
TSH: 1.908

## 2011-07-13 LAB — APTT: aPTT: 28

## 2011-08-05 ENCOUNTER — Other Ambulatory Visit: Payer: Self-pay | Admitting: Internal Medicine

## 2011-09-30 ENCOUNTER — Encounter: Payer: BC Managed Care – PPO | Admitting: Internal Medicine

## 2011-10-23 ENCOUNTER — Encounter: Payer: Self-pay | Admitting: Internal Medicine

## 2011-10-23 ENCOUNTER — Encounter: Payer: BC Managed Care – PPO | Admitting: Internal Medicine

## 2011-10-23 ENCOUNTER — Ambulatory Visit (INDEPENDENT_AMBULATORY_CARE_PROVIDER_SITE_OTHER): Payer: BC Managed Care – PPO | Admitting: Internal Medicine

## 2011-10-23 DIAGNOSIS — Z860100 Personal history of colon polyps, unspecified: Secondary | ICD-10-CM

## 2011-10-23 DIAGNOSIS — Z23 Encounter for immunization: Secondary | ICD-10-CM | POA: Diagnosis not present

## 2011-10-23 DIAGNOSIS — E7800 Pure hypercholesterolemia, unspecified: Secondary | ICD-10-CM | POA: Diagnosis not present

## 2011-10-23 DIAGNOSIS — Z0000 Encounter for general adult medical examination without abnormal findings: Secondary | ICD-10-CM | POA: Diagnosis not present

## 2011-10-23 DIAGNOSIS — E785 Hyperlipidemia, unspecified: Secondary | ICD-10-CM

## 2011-10-23 DIAGNOSIS — Z125 Encounter for screening for malignant neoplasm of prostate: Secondary | ICD-10-CM

## 2011-10-23 DIAGNOSIS — K219 Gastro-esophageal reflux disease without esophagitis: Secondary | ICD-10-CM

## 2011-10-23 DIAGNOSIS — J309 Allergic rhinitis, unspecified: Secondary | ICD-10-CM

## 2011-10-23 LAB — CBC WITH DIFFERENTIAL/PLATELET
Basophils Absolute: 0 10*3/uL (ref 0.0–0.1)
Basophils Relative: 0.5 % (ref 0.0–3.0)
Eosinophils Absolute: 0.3 10*3/uL (ref 0.0–0.7)
Eosinophils Relative: 3.1 % (ref 0.0–5.0)
HCT: 44.8 % (ref 39.0–52.0)
Hemoglobin: 15.1 g/dL (ref 13.0–17.0)
Lymphocytes Relative: 24.1 % (ref 12.0–46.0)
Lymphs Abs: 1.9 10*3/uL (ref 0.7–4.0)
MCHC: 33.6 g/dL (ref 30.0–36.0)
MCV: 91.5 fl (ref 78.0–100.0)
Monocytes Absolute: 0.7 10*3/uL (ref 0.1–1.0)
Monocytes Relative: 8.4 % (ref 3.0–12.0)
Neutro Abs: 5.2 10*3/uL (ref 1.4–7.7)
Neutrophils Relative %: 63.9 % (ref 43.0–77.0)
Platelets: 263 10*3/uL (ref 150.0–400.0)
RBC: 4.9 Mil/uL (ref 4.22–5.81)
RDW: 13.2 % (ref 11.5–14.6)
WBC: 8.1 10*3/uL (ref 4.5–10.5)

## 2011-10-23 LAB — COMPREHENSIVE METABOLIC PANEL
ALT: 28 U/L (ref 0–53)
AST: 34 U/L (ref 0–37)
Albumin: 4.2 g/dL (ref 3.5–5.2)
Alkaline Phosphatase: 76 U/L (ref 39–117)
BUN: 14 mg/dL (ref 6–23)
CO2: 33 mEq/L — ABNORMAL HIGH (ref 19–32)
Calcium: 9.8 mg/dL (ref 8.4–10.5)
Chloride: 102 mEq/L (ref 96–112)
Creatinine, Ser: 0.8 mg/dL (ref 0.4–1.5)
GFR: 97.31 mL/min (ref >60.00)
Glucose, Bld: 98 mg/dL (ref 70–99)
Potassium: 4.7 mEq/L (ref 3.5–5.1)
Sodium: 140 mEq/L (ref 135–145)
Total Bilirubin: 0.5 mg/dL (ref 0.3–1.2)
Total Protein: 7.5 g/dL (ref 6.0–8.3)

## 2011-10-23 LAB — TSH: TSH: 0.99 u[IU]/mL (ref 0.35–5.50)

## 2011-10-23 LAB — PSA: PSA: 0.61 ng/mL (ref 0.10–4.00)

## 2011-10-23 MED ORDER — FEXOFENADINE HCL 180 MG PO TABS: 180.0000 mg | ORAL_TABLET | Freq: Every day | ORAL | Status: DC

## 2011-10-23 MED ORDER — TAMSULOSIN HCL 0.4 MG PO CAPS: 0.4000 mg | ORAL_CAPSULE | Freq: Once | ORAL | Status: DC

## 2011-10-23 MED ORDER — SIMVASTATIN 20 MG PO TABS: 20.0000 mg | ORAL_TABLET | Freq: Every evening | ORAL | Status: DC

## 2011-10-23 NOTE — Patient Instructions (Signed)
Limit your sodium (Salt) intake  Return in one year for follow-up     It is important that you exercise regularly, at least 20 minutes 3 to 4 times per week.  If you develop chest pain or shortness of breath seek  medical attention.

## 2011-10-23 NOTE — Progress Notes (Signed)
Subjective:    Patient ID: Brandon Dixon, male    DOB: 03-May-1946, 66 y.o.   MRN: [id number]  HPI 66  year-old patient who is seen today for a wellness exam;   medical problems include dyslipidemia mild BPH and allergic rhinitis. He is on chronic Allegra but has not benefited much in the past with nasal steroids. This is his chief complaint. He has been disabled since 2006 following left shoulder surgery.    Here for Medicare AWV:  1. Risk factors based on Past M, S, F history:a vascular risk factors include dyslipidemia  2. Physical Activities: remains quite active without exercise limitations  3. Depression/mood: noticed her depression and mood disorder  4. Hearing: no hearing deficits  5. ADL's: independent in all aspects of daily living  6. Fall Risk: low  7. Home Safety: no problems identified  8. Height, weight, &visual acuity:height and weight stable. No difficulty with visual acuity  9. Counseling: heart healthy diet, salt restricted diet. All encouraged  10. Labs ordered based on risk factors:laboratory profile, including lipid panel will be reviewed  11. Referral Coordination- not appropriate at this time. will need colonoscopy in 4 years  12. Care Plan- heart healthy diet more regular and vigorous exercise. Encouraged  13. Cognitive Assessment- alert and oriented, with normal affect     Allergies (verified):  No Known Drug Allergies   Past History:  Past Medical History:  Reviewed history from 09/25/2009 and no changes required.   Colonic polyps, hx of  GERD  Benign prostatic hypertrophy  Allergic rhinitis/septal deviation   Past Surgical History:  Reviewed history from 09/25/2009 and no changes required.   Rotator cuff repair  Vasectomy  colonoscopy November 2005, January 2011 Dr. Loreta Ave  stress Myoview April 2009   Family History:  Reviewed history from 09/26/2007 and no changes required.   father died at 36, lung cancer  mother died at 32. CAD  5 brothers,  positive diabetes  two sisters positive for hypertension   Social History:  Reviewed history from 09/26/2007 and no changes required.   Married  3 daughters     Review of Systems  Constitutional: Negative for fever, chills, activity change, appetite change and fatigue.  HENT: Negative for hearing loss, ear pain, congestion, rhinorrhea, sneezing, mouth sores, trouble swallowing, neck pain, neck stiffness, dental problem, voice change, sinus pressure and tinnitus.   Eyes: Negative for photophobia, pain, redness and visual disturbance.  Respiratory: Negative for apnea, cough, choking, chest tightness, shortness of breath and wheezing.   Cardiovascular: Negative for chest pain, palpitations and leg swelling.  Gastrointestinal: Negative for nausea, vomiting, abdominal pain, diarrhea, constipation, blood in stool, abdominal distention, anal bleeding and rectal pain.  Genitourinary: Negative for dysuria, urgency, frequency, hematuria, flank pain, decreased urine volume, discharge, penile swelling, scrotal swelling, difficulty urinating, genital sores and testicular pain.  Musculoskeletal: Negative for myalgias, back pain, joint swelling, arthralgias and gait problem.  Skin: Negative for color change, rash and wound.  Neurological: Negative for dizziness, tremors, seizures, syncope, facial asymmetry, speech difficulty, weakness, light-headedness, numbness and headaches.  Hematological: Negative for adenopathy. Does not bruise/bleed easily.  Psychiatric/Behavioral: Negative for suicidal ideas, hallucinations, behavioral problems, confusion, sleep disturbance, self-injury, dysphoric mood, decreased concentration and agitation. The patient is not nervous/anxious.        Objective:   Physical Exam  Constitutional: He appears well-developed and well-nourished.  HENT:  Head: Normocephalic and atraumatic.  Right Ear: External ear normal.  Left Ear: External ear normal.  Nose:  Nose normal.    Mouth/Throat: Oropharynx is clear and moist.       Congested turbinates  Eyes: Conjunctivae and EOM are normal. Pupils are equal, round, and reactive to light. No scleral icterus.  Neck: Normal range of motion. Neck supple. No JVD present. No thyromegaly present.  Cardiovascular: Regular rhythm, normal heart sounds and intact distal pulses.  Exam reveals no gallop and no friction rub.   No murmur heard. Pulmonary/Chest: Effort normal and breath sounds normal. He exhibits no tenderness.  Abdominal: Soft. Bowel sounds are normal. He exhibits no distension and no mass. There is no tenderness.  Genitourinary: Penis normal.       +1 enlarged  Musculoskeletal: Normal range of motion. He exhibits no edema and no tenderness.  Lymphadenopathy:    He has no cervical adenopathy.  Neurological: He is alert. He has normal reflexes. No cranial nerve deficit. Coordination normal.  Skin: Skin is warm and dry. No rash noted.       Mild digital clubbing  Psychiatric: He has a normal mood and affect. His behavior is normal.          Assessment & Plan:  Preventive health examination History colonic polyps Dyslipidemia Allergic rhinitis. Will give trial of Qnasl  . He will consider allergy referral

## 2011-10-27 ENCOUNTER — Other Ambulatory Visit: Payer: Self-pay

## 2011-10-27 MED ORDER — TAMSULOSIN HCL 0.4 MG PO CAPS: 0.4000 mg | ORAL_CAPSULE | Freq: Every day | ORAL | Status: DC

## 2012-04-26 ENCOUNTER — Encounter: Payer: Self-pay | Admitting: Internal Medicine

## 2012-04-26 ENCOUNTER — Ambulatory Visit (INDEPENDENT_AMBULATORY_CARE_PROVIDER_SITE_OTHER): Payer: BC Managed Care – PPO | Admitting: Internal Medicine

## 2012-04-26 VITALS — BP 90/60 | Temp 98.2°F | Wt 172.0 lb

## 2012-04-26 DIAGNOSIS — S90129A Contusion of unspecified lesser toe(s) without damage to nail, initial encounter: Secondary | ICD-10-CM | POA: Diagnosis not present

## 2012-04-26 NOTE — Progress Notes (Signed)
  Subjective:    Patient ID: Brandon Dixon, male    DOB: 06-10-1946, 66 y.o.   MRN: [id number]  HPI  66 year old patient who traumatized his right great toe about a week and a half ago. He accidentally jammed his toe against a rock. He has had persistent pain and swelling since this time.    Review of Systems  Musculoskeletal: Positive for joint swelling and gait problem.       Objective:   Physical Exam  Musculoskeletal:       Mild soft tissue swelling of the right great toe. Mild hallux valgus deformity. Good flexion and extension of the toe          Assessment & Plan:   Probable R toe contusion;  Will treat with Vimovo twice daily

## 2012-04-26 NOTE — Patient Instructions (Signed)
Elevate as much as possible  Vimovo one twice daily   Call or return to clinic prn if these symptoms worsen or fail to improve as anticipated.

## 2012-05-30 ENCOUNTER — Telehealth: Payer: Self-pay | Admitting: Internal Medicine

## 2012-05-30 MED ORDER — BECLOMETHASONE DIPROPIONATE 80 MCG/ACT NA AERS: 1.0000 | INHALATION_SPRAY | Freq: Every day | NASAL | Status: DC

## 2012-05-30 NOTE — Telephone Encounter (Signed)
Pt said that Dr Amador Cunas gave pt a sample of Qnasl (BedoMethasone) 80pcg per spray. Pt req to get a script for this med called in to [facility] on [address].

## 2012-06-01 ENCOUNTER — Telehealth: Payer: Self-pay | Admitting: Family Medicine

## 2012-06-01 MED ORDER — MOMETASONE FUROATE 50 MCG/ACT NA SUSP: 2.0000 | Freq: Every day | NASAL | Status: DC

## 2012-06-01 NOTE — Telephone Encounter (Signed)
Attempt tp call pt - VM - LMTCB if questions - ins denied Qnasl - need to try nasonex for 3 wks - i will send a new rx to pharmacy - call back in 2-3 wks if not helping and then at that time we can try Qnasl again

## 2012-06-01 NOTE — Telephone Encounter (Signed)
Brandon Dixon, the Rx for Qnasl requires prior auth. Patieny must try GXALT or NASONEX first, or PA will be denied. I did not see anything in chart to reflect this had been done. Fluticasone was in EMR. Thanks!

## 2012-06-08 ENCOUNTER — Telehealth: Payer: Self-pay | Admitting: Internal Medicine

## 2012-06-08 NOTE — Telephone Encounter (Addendum)
Pt called and said that mometasone (NASONEX) 50 MCG/ACT nasal spray doesn't work well. Pt is req to get a script for the Qnasl [facility] to [facility] on [address].    Pt said to pls call in asap.

## 2012-06-09 MED ORDER — BECLOMETHASONE DIPROPIONATE 80 MCG/ACT NA AERS
1.0000 | INHALATION_SPRAY | Freq: Every day | NASAL | Status: DC
Start: 2012-06-09 — End: 2012-06-13

## 2012-06-09 NOTE — Telephone Encounter (Signed)
Ok RF 6 

## 2012-06-09 NOTE — Telephone Encounter (Signed)
Pls advise if medication can be changed.

## 2012-06-09 NOTE — Telephone Encounter (Signed)
Rx sent to pharmacy   

## 2012-06-13 ENCOUNTER — Telehealth: Payer: Self-pay | Admitting: Internal Medicine

## 2012-06-13 MED ORDER — BECLOMETHASONE DIPROPIONATE 80 MCG/ACT NA AERS: 1.0000 | INHALATION_SPRAY | Freq: Every day | NASAL | Status: DC

## 2012-06-13 NOTE — Telephone Encounter (Signed)
Failure of treatment with nasonex - new rx sent to [facility]

## 2012-06-13 NOTE — Telephone Encounter (Signed)
Patient called stating that he would like an rx for qnasl called into [facility] [address] as the nasonex is not working. Please assist. Patient has a $25 discount coupon for the Qnasl

## 2012-06-21 ENCOUNTER — Telehealth: Payer: Self-pay | Admitting: Internal Medicine

## 2012-06-21 MED ORDER — BECLOMETHASONE DIPROPIONATE 80 MCG/ACT NA AERS: 1.0000 | INHALATION_SPRAY | Freq: Every day | NASAL | Status: DC

## 2012-06-21 NOTE — Telephone Encounter (Signed)
Patient calling, requesting a script for the Q-nasl  dry mist.  He was given samples of this at his office visit and it helped alot.  Has coupon for $25 month thru the end of the year.   [facility] [address]

## 2012-06-21 NOTE — Telephone Encounter (Signed)
This was done last week - 06/13/12

## 2012-08-01 DIAGNOSIS — Z23 Encounter for immunization: Secondary | ICD-10-CM | POA: Diagnosis not present

## 2012-10-26 ENCOUNTER — Other Ambulatory Visit: Payer: Self-pay | Admitting: Internal Medicine

## 2012-11-01 ENCOUNTER — Ambulatory Visit (INDEPENDENT_AMBULATORY_CARE_PROVIDER_SITE_OTHER): Payer: BC Managed Care – PPO | Admitting: Internal Medicine

## 2012-11-01 ENCOUNTER — Encounter: Payer: Self-pay | Admitting: Internal Medicine

## 2012-11-01 VITALS — BP 120/80 | HR 80 | Temp 97.8°F | Resp 18 | Ht 71.5 in | Wt 172.0 lb

## 2012-11-01 DIAGNOSIS — K219 Gastro-esophageal reflux disease without esophagitis: Secondary | ICD-10-CM

## 2012-11-01 DIAGNOSIS — E7800 Pure hypercholesterolemia, unspecified: Secondary | ICD-10-CM

## 2012-11-01 DIAGNOSIS — Z86010 Personal history of colonic polyps: Secondary | ICD-10-CM

## 2012-11-01 DIAGNOSIS — Z0000 Encounter for general adult medical examination without abnormal findings: Secondary | ICD-10-CM

## 2012-11-01 DIAGNOSIS — J309 Allergic rhinitis, unspecified: Secondary | ICD-10-CM

## 2012-11-01 DIAGNOSIS — N400 Enlarged prostate without lower urinary tract symptoms: Secondary | ICD-10-CM

## 2012-11-01 LAB — CBC WITH DIFFERENTIAL/PLATELET
Basophils Absolute: 0 10*3/uL (ref 0.0–0.1)
Basophils Relative: 0.2 % (ref 0.0–3.0)
Eosinophils Absolute: 0.2 10*3/uL (ref 0.0–0.7)
Eosinophils Relative: 2.2 % (ref 0.0–5.0)
HCT: 48.3 % (ref 39.0–52.0)
Hemoglobin: 15.9 g/dL (ref 13.0–17.0)
Lymphocytes Relative: 28.9 % (ref 12.0–46.0)
Lymphs Abs: 2.5 10*3/uL (ref 0.7–4.0)
MCHC: 32.9 g/dL (ref 30.0–36.0)
MCV: 91.3 fl (ref 78.0–100.0)
Monocytes Absolute: 0.8 10*3/uL (ref 0.1–1.0)
Monocytes Relative: 9.4 % (ref 3.0–12.0)
Neutro Abs: 5.2 10*3/uL (ref 1.4–7.7)
Neutrophils Relative %: 59.3 % (ref 43.0–77.0)
Platelets: 221 10*3/uL (ref 150.0–400.0)
RBC: 5.29 Mil/uL (ref 4.22–5.81)
RDW: 13.2 % (ref 11.5–14.6)
WBC: 8.8 10*3/uL (ref 4.5–10.5)

## 2012-11-01 MED ORDER — FINASTERIDE 5 MG PO TABS: 5.0000 mg | ORAL_TABLET | Freq: Every day | ORAL | Status: DC

## 2012-11-01 MED ORDER — BECLOMETHASONE DIPROPIONATE 80 MCG/ACT NA AERS: 1.0000 | INHALATION_SPRAY | Freq: Every day | NASAL | Status: DC

## 2012-11-01 MED ORDER — SIMVASTATIN 20 MG PO TABS: 20.0000 mg | ORAL_TABLET | Freq: Every evening | ORAL | Status: DC

## 2012-11-01 MED ORDER — TAMSULOSIN HCL 0.4 MG PO CAPS: 0.4000 mg | ORAL_CAPSULE | Freq: Every day | ORAL | Status: DC

## 2012-11-01 NOTE — Patient Instructions (Signed)
Limit your sodium (Salt) intake  Please check your blood pressure on a regular basis.  If it is consistently greater than 150/90, please make an office appointment.  Return in one year for follow-up  

## 2012-11-01 NOTE — Progress Notes (Signed)
Patient ID: Brandon Dixon, male   DOB: [birth year], 67 y.o.   MRN: [id number]  Subjective:    Patient ID: Brandon Dixon, male    DOB: 06-30-1946, 67 y.o.   MRN: [id number]  HPI 67   year-old patient who is seen today for a wellness exam;   medical problems include dyslipidemia mild BPH and allergic rhinitis.  He has been disabled since 2006 following left shoulder surgery.  His main complaint is urinary frequency and nocturia. He has been on Flomax. He also describes some fecal urgency associated with the urinary urgency  Here for Medicare AWV:  1. Risk factors based on Past M, S, F history:a vascular risk factors include dyslipidemia  2. Physical Activities: remains quite active without exercise limitations  3. Depression/mood: noticed her depression and mood disorder  4. Hearing: no hearing deficits  5. ADL's: independent in all aspects of daily living  6. Fall Risk: low  7. Home Safety: no problems identified  8. Height, weight, &visual acuity:height and weight stable. No difficulty with visual acuity  9. Counseling: heart healthy diet, salt restricted diet. All encouraged  10. Labs ordered based on risk factors:laboratory profile, including lipid panel will be reviewed  11. Referral Coordination- not appropriate at this time. will need colonoscopy in 4 years  12. Care Plan- heart healthy diet more regular and vigorous exercise. Encouraged  13. Cognitive Assessment- alert and oriented, with normal affect     Allergies (verified):  No Known Drug Allergies   Past History:  Past Medical History:  Reviewed history from 09/25/2009 and no changes required.   Colonic polyps, hx of  GERD  Benign prostatic hypertrophy  Allergic rhinitis/septal deviation   Past Surgical History:  Reviewed history from 09/25/2009 and no changes required.   Rotator cuff repair  Vasectomy  colonoscopy November 2005, January 2011 Dr. Loreta Ave  stress Myoview April 2009   Family History:  Reviewed history  from 09/26/2007 and no changes required.   father died at 26, lung cancer  mother died at 55. CAD  5 brothers, positive diabetes ; one brother with recent diagnosis of pancreatic cancer (2013) two sisters positive for hypertension   Social History:  Reviewed history from 09/26/2007 and no changes required.   Married  3 daughters     Review of Systems  Constitutional: Negative for fever, chills, activity change, appetite change and fatigue.  HENT: Negative for hearing loss, ear pain, congestion, rhinorrhea, sneezing, mouth sores, trouble swallowing, neck pain, neck stiffness, dental problem, voice change, sinus pressure and tinnitus.   Eyes: Negative for photophobia, pain, redness and visual disturbance.  Respiratory: Negative for apnea, cough, choking, chest tightness, shortness of breath and wheezing.   Cardiovascular: Negative for chest pain, palpitations and leg swelling.  Gastrointestinal: Negative for nausea, vomiting, abdominal pain, diarrhea, constipation, blood in stool, abdominal distention, anal bleeding and rectal pain.  Genitourinary: Negative for dysuria, urgency, frequency, hematuria, flank pain, decreased urine volume, discharge, penile swelling, scrotal swelling, difficulty urinating, genital sores and testicular pain.  Musculoskeletal: Negative for myalgias, back pain, joint swelling, arthralgias and gait problem.  Skin: Negative for color change, rash and wound.  Neurological: Negative for dizziness, tremors, seizures, syncope, facial asymmetry, speech difficulty, weakness, light-headedness, numbness and headaches.  Hematological: Negative for adenopathy. Does not bruise/bleed easily.  Psychiatric/Behavioral: Negative for suicidal ideas, hallucinations, behavioral problems, confusion, sleep disturbance, self-injury, dysphoric mood, decreased concentration and agitation. The patient is not nervous/anxious.   been     Objective:  Physical Exam  Constitutional: He  appears well-developed and well-nourished.  HENT:  Head: Normocephalic and atraumatic.  Right Ear: External ear normal.  Left Ear: External ear normal.  Nose: Nose normal.  Mouth/Throat: Oropharynx is clear and moist.       Congested turbinates  Eyes: Conjunctivae normal and EOM are normal. Pupils are equal, round, and reactive to light. No scleral icterus.  Neck: Normal range of motion. Neck supple. No JVD present. No thyromegaly present.  Cardiovascular: Regular rhythm, normal heart sounds and intact distal pulses.  Exam reveals no gallop and no friction rub.   No murmur heard. Pulmonary/Chest: Effort normal and breath sounds normal. He exhibits no tenderness.  Abdominal: Soft. Bowel sounds are normal. He exhibits no distension and no mass. There is no tenderness.  Genitourinary: Penis normal.       +2  enlarged  Musculoskeletal: Normal range of motion. He exhibits no edema and no tenderness.  Lymphadenopathy:    He has no cervical adenopathy.  Neurological: He is alert. He has normal reflexes. No cranial nerve deficit. Coordination normal.  Skin: Skin is warm and dry. No rash noted.       Mild digital clubbing  Psychiatric: He has a normal mood and affect. His behavior is normal.          Assessment & Plan:  Preventive health examination History colonic polyps Dyslipidemia Allergic rhinitis.  Symptomatic BPH. Options were discussed he wishes to try Proscar

## 2012-11-02 ENCOUNTER — Other Ambulatory Visit: Payer: Self-pay | Admitting: *Deleted

## 2012-11-02 LAB — COMPREHENSIVE METABOLIC PANEL
ALT: 29 U/L (ref 0–53)
AST: 39 U/L — ABNORMAL HIGH (ref 0–37)
Albumin: 4.4 g/dL (ref 3.5–5.2)
Alkaline Phosphatase: 75 U/L (ref 39–117)
BUN: 12 mg/dL (ref 6–23)
CO2: 32 mEq/L (ref 19–32)
Calcium: 9.8 mg/dL (ref 8.4–10.5)
Chloride: 100 mEq/L (ref 96–112)
Creatinine, Ser: 1 mg/dL (ref 0.4–1.5)
GFR: 84.16 mL/min (ref >60.00)
Glucose, Bld: 81 mg/dL (ref 70–99)
Potassium: 3.9 mEq/L (ref 3.5–5.1)
Sodium: 139 mEq/L (ref 135–145)
Total Bilirubin: 0.8 mg/dL (ref 0.3–1.2)
Total Protein: 8.2 g/dL (ref 6.0–8.3)

## 2012-11-02 LAB — LIPID PANEL
Cholesterol: 199 mg/dL (ref 0–200)
HDL: 74.4 mg/dL (ref >39.00)
LDL Cholesterol: 106 mg/dL — ABNORMAL HIGH (ref 0–99)
Total CHOL/HDL Ratio: 3
Triglycerides: 95 mg/dL (ref 0.0–149.0)
VLDL: 19 mg/dL (ref 0.0–40.0)

## 2012-11-02 LAB — TSH: TSH: 1.45 u[IU]/mL (ref 0.35–5.50)

## 2012-11-02 MED ORDER — BECLOMETHASONE DIPROP MONOHYD 42 MCG/SPRAY NA SUSP: 1.0000 | Freq: Two times a day (BID) | NASAL | Status: DC

## 2012-11-02 NOTE — Telephone Encounter (Signed)
Received fax from [facility] to clarify Rx for Nasal Spray. Rx clarified and faxed back.

## 2012-11-21 ENCOUNTER — Telehealth: Payer: Self-pay | Admitting: Internal Medicine

## 2012-11-21 NOTE — Telephone Encounter (Signed)
Pt needs health  screening form with lab result. Please call pt when form is ready for pick up

## 2012-11-23 NOTE — Telephone Encounter (Signed)
Spoke to pt told him found the form in Dr. Vernon Prey office and is not complete yet, He will be back in the office on Tues 2/11 I will have him complete then and will call you and let you know it is ready. Pt verbalized understanding.

## 2012-11-29 NOTE — Telephone Encounter (Signed)
Pt aware form done and faxed.

## 2013-03-30 ENCOUNTER — Encounter: Payer: Self-pay | Admitting: Internal Medicine

## 2013-03-30 ENCOUNTER — Ambulatory Visit (INDEPENDENT_AMBULATORY_CARE_PROVIDER_SITE_OTHER): Payer: BC Managed Care – PPO | Admitting: Internal Medicine

## 2013-03-30 ENCOUNTER — Telehealth: Payer: Self-pay | Admitting: Internal Medicine

## 2013-03-30 VITALS — BP 128/70 | HR 88 | Temp 98.6°F | Resp 20 | Wt 173.0 lb

## 2013-03-30 DIAGNOSIS — L259 Unspecified contact dermatitis, unspecified cause: Secondary | ICD-10-CM | POA: Diagnosis not present

## 2013-03-30 MED ORDER — METHYLPREDNISOLONE ACETATE 80 MG/ML IJ SUSP
80.0000 mg | Freq: Once | INTRAMUSCULAR | Status: AC
Start: 2013-03-30 — End: 2013-03-30
  Administered 2013-03-30: 80 mg via INTRAMUSCULAR

## 2013-03-30 NOTE — Progress Notes (Signed)
Subjective:    Patient ID: Brandon Dixon, male    DOB: 04-15-1946, 68 y.o.   MRN: [id number]  HPI  67 year old patient who presents with a two-day history of a rash primarily over the arms but has become more generalized. This occurred 2 days after doing yard work and exposure to [employer]  Past Medical History  Diagnosis Date  . ALLERGIC RHINITIS 09/26/2007  . BENIGN PROSTATIC HYPERTROPHY 05/27/2007  . COLONIC POLYPS, HX OF 05/27/2007  . DERMATITIS 08/25/2010  . GERD 05/27/2007  . HYPERCHOLESTEROLEMIA 09/26/2007    History   Social History  . Marital Status: Married    Spouse Name: N/A    Number of Children: N/A  . Years of Education: N/A   Occupational History  . Not on file.   Social History Main Topics  . Smoking status: Former Smoker    Quit date: 10/19/2001  . Smokeless tobacco: Never Used  . Alcohol Use: Yes  . Drug Use: No  . Sexually Active: Not on file   Other Topics Concern  . Not on file   Social History Narrative  . No narrative on file    Past Surgical History  Procedure Laterality Date  . Vasectomy    . Rotator cuff repair      Family History  Problem Relation Age of Onset  . Heart disease Mother   . Cancer Father     lung ca  . Hypertension Sister   . Diabetes Brother   . Hypertension Sister   . Diabetes Brother   . Diabetes Brother   . Diabetes Brother   . Diabetes Brother     No Known Allergies  Current Outpatient Prescriptions on File Prior to Visit  Medication Sig Dispense Refill  . betamethasone dipropionate (DIPROLENE) 0.05 % cream Apply topically 2 (two) times daily as needed.       . Multiple Vitamin (MULTIVITAMIN) tablet Take 1 tablet by mouth daily.        . simvastatin (ZOCOR) 20 MG tablet Take 1 tablet (20 mg total) by mouth every evening.  90 tablet  4  . Tamsulosin HCl (FLOMAX) 0.4 MG CAPS Take 1 capsule (0.4 mg total) by mouth daily after supper.  90 capsule  4  . triamcinolone (KENALOG) 0.1 % cream Apply 1 application topically  2 (two) times daily as needed.        No current facility-administered medications on file prior to visit.    BP 128/70  Pulse 88  Temp(Src) 98.6 F (37 C) (Oral)  Resp 20  Wt 173 lb (78.472 kg)  BMI 23.79 kg/m2  SpO2 96%       Review of Systems  Constitutional: Negative for fever, chills, appetite change and fatigue.  HENT: Negative for hearing loss, ear pain, congestion, sore throat, trouble swallowing, neck stiffness, dental problem, voice change and tinnitus.   Eyes: Negative for pain, discharge and visual disturbance.  Respiratory: Negative for cough, chest tightness, wheezing and stridor.   Cardiovascular: Negative for chest pain, palpitations and leg swelling.  Gastrointestinal: Negative for nausea, vomiting, abdominal pain, diarrhea, constipation, blood in stool and abdominal distention.  Genitourinary: Negative for urgency, hematuria, flank pain, discharge, difficulty urinating and genital sores.  Musculoskeletal: Negative for myalgias, back pain, joint swelling, arthralgias and gait problem.  Skin: Positive for rash.  Neurological: Negative for dizziness, syncope, speech difficulty, weakness, numbness and headaches.  Hematological: Negative for adenopathy. Does not bruise/bleed easily.  Psychiatric/Behavioral: Negative for behavioral problems and dysphoric mood. The  patient is not nervous/anxious.        Objective:   Physical Exam  Constitutional: He appears well-developed and well-nourished. No distress.  Skin: Rash noted.  Scattered papulovesicular lesions consistent with a contact dermatitis most marked over the arms          Assessment & Plan:   Content dermatitis. Will treat with the Depo-Medrol as well as antihistamines

## 2013-03-30 NOTE — Telephone Encounter (Signed)
Patient Information:  Caller Name: [facility]  Phone: [phone]  Patient: Brandon Dixon, Brandon Dixon  Gender: Male  DOB: Jan 07, 1946  Age: 67 Years  PCP: Eleonore Chiquito [facility])  Office Follow Up:  Does the office need to follow up with this patient?: No  Instructions For The Office: N/A  RN Note:  Reports spreading generalized rash.  [place]13 [place]  Also was burning weeds. Notes a few small blisters in rash; some spots resemble welts. Rash covers arms, hands and large areas on abdomen and back.  Also had tick bite 1 week ago.  Symptoms  Reason For Call & Symptoms: Generalized itchy rash for past 4 days.  Also occasionally spits blood after brushing teeth or in the mornings before brushing for past "few months."  Reviewed Health History In EMR: Yes  Reviewed Medications In EMR: Yes  Reviewed Allergies In EMR: Yes  Reviewed Surgeries / Procedures: Yes  Date of Onset of Symptoms: 03/27/2013  Treatments Tried: Calamine lotion, ice pack compress.  Treatments Tried Worked: Yes  Guideline(s) Used:  [facility] [place] [facility] or [facility]  Disposition Per Guideline:   See Today in Office  Reason For Disposition Reached:   Severe itching interferes with normal activities (e.g., work or school) or prevents sleep  Advice Given:  Hydrocortisone Cream for Itching:   Apply 1% hydrocortisone cream 4 times a day to reduce itching. Use it for 5 days.  Keep the cream in the refrigerator (Reason: it feels better if applied cold).  Apply Cold to the Area:  Soak the involved area in cool water for 20 minutes or massage it with an ice cube as often as necessary to reduce itching and oozing.  Oral Antihistamine Medication for Itching:   Do not take antihistamine medications if you have prostate problems.  Antihistamines may cause sleepiness. Do not drink, drive, or operate dangerous machinery while taking antihistamines.  An over-the-counter antihistamine that  causes less sleepiness is loratadine (e.g., Alavert or Claritin).  Avoid Scratching:   Cut your fingernails short and try not to scratch so as to prevent a secondary infection from bacteria.  Call Back If:  Rash lasts longer than 3 weeks  It looks infected  You become worse.  Patient Will Follow Care Advice:  YES  Appointment Scheduled:  03/30/2013 15:15:00 Appointment Scheduled Provider:  Eleonore Chiquito [facility])

## 2013-03-30 NOTE — Patient Instructions (Addendum)

## 2013-05-20 ENCOUNTER — Other Ambulatory Visit: Payer: Self-pay | Admitting: Internal Medicine

## 2013-07-19 DIAGNOSIS — Z23 Encounter for immunization: Secondary | ICD-10-CM | POA: Diagnosis not present

## 2013-11-14 ENCOUNTER — Ambulatory Visit (INDEPENDENT_AMBULATORY_CARE_PROVIDER_SITE_OTHER): Payer: BC Managed Care – PPO | Admitting: Internal Medicine

## 2013-11-14 ENCOUNTER — Encounter: Payer: Self-pay | Admitting: Internal Medicine

## 2013-11-14 VITALS — BP 140/80 | HR 79 | Temp 98.6°F | Resp 20 | Ht 71.5 in | Wt 174.0 lb

## 2013-11-14 DIAGNOSIS — Z0000 Encounter for general adult medical examination without abnormal findings: Secondary | ICD-10-CM

## 2013-11-14 DIAGNOSIS — Z23 Encounter for immunization: Secondary | ICD-10-CM

## 2013-11-14 DIAGNOSIS — J309 Allergic rhinitis, unspecified: Secondary | ICD-10-CM | POA: Diagnosis not present

## 2013-11-14 DIAGNOSIS — Z86010 Personal history of colonic polyps: Secondary | ICD-10-CM | POA: Diagnosis not present

## 2013-11-14 DIAGNOSIS — E7800 Pure hypercholesterolemia, unspecified: Secondary | ICD-10-CM | POA: Diagnosis not present

## 2013-11-14 DIAGNOSIS — K219 Gastro-esophageal reflux disease without esophagitis: Secondary | ICD-10-CM

## 2013-11-14 DIAGNOSIS — N400 Enlarged prostate without lower urinary tract symptoms: Secondary | ICD-10-CM | POA: Diagnosis not present

## 2013-11-14 LAB — LIPID PANEL
Cholesterol: 180 mg/dL (ref 0–200)
HDL: 59.6 mg/dL (ref >39.00)
LDL Cholesterol: 105 mg/dL — ABNORMAL HIGH (ref 0–99)
Total CHOL/HDL Ratio: 3
Triglycerides: 79 mg/dL (ref 0.0–149.0)
VLDL: 15.8 mg/dL (ref 0.0–40.0)

## 2013-11-14 LAB — COMPREHENSIVE METABOLIC PANEL
ALT: 42 U/L (ref 0–53)
AST: 43 U/L — ABNORMAL HIGH (ref 0–37)
Albumin: 4.2 g/dL (ref 3.5–5.2)
Alkaline Phosphatase: 76 U/L (ref 39–117)
BUN: 13 mg/dL (ref 6–23)
CO2: 28 mEq/L (ref 19–32)
Calcium: 9.7 mg/dL (ref 8.4–10.5)
Chloride: 105 mEq/L (ref 96–112)
Creatinine, Ser: 1 mg/dL (ref 0.4–1.5)
GFR: 81.91 mL/min (ref >60.00)
Glucose, Bld: 96 mg/dL (ref 70–99)
Potassium: 4 mEq/L (ref 3.5–5.1)
Sodium: 139 mEq/L (ref 135–145)
Total Bilirubin: 0.9 mg/dL (ref 0.3–1.2)
Total Protein: 8.1 g/dL (ref 6.0–8.3)

## 2013-11-14 LAB — CBC WITH DIFFERENTIAL/PLATELET
Basophils Absolute: 0 10*3/uL (ref 0.0–0.1)
Basophils Relative: 0.3 % (ref 0.0–3.0)
Eosinophils Absolute: 0.2 10*3/uL (ref 0.0–0.7)
Eosinophils Relative: 2.4 % (ref 0.0–5.0)
HCT: 45.2 % (ref 39.0–52.0)
Hemoglobin: 15.2 g/dL (ref 13.0–17.0)
Lymphocytes Relative: 26.3 % (ref 12.0–46.0)
Lymphs Abs: 2.2 10*3/uL (ref 0.7–4.0)
MCHC: 33.5 g/dL (ref 30.0–36.0)
MCV: 89.8 fl (ref 78.0–100.0)
Monocytes Absolute: 0.7 10*3/uL (ref 0.1–1.0)
Monocytes Relative: 8.5 % (ref 3.0–12.0)
Neutro Abs: 5.2 10*3/uL (ref 1.4–7.7)
Neutrophils Relative %: 62.5 % (ref 43.0–77.0)
Platelets: 318 10*3/uL (ref 150.0–400.0)
RBC: 5.03 Mil/uL (ref 4.22–5.81)
RDW: 13.4 % (ref 11.5–14.6)
WBC: 8.3 10*3/uL (ref 4.5–10.5)

## 2013-11-14 LAB — TSH: TSH: 0.91 u[IU]/mL (ref 0.35–5.50)

## 2013-11-14 MED ORDER — FLUTICASONE PROPIONATE 50 MCG/ACT NA SUSP: 2.0000 | Freq: Every day | NASAL | Status: DC

## 2013-11-14 MED ORDER — HYDROCODONE-HOMATROPINE 5-1.5 MG/5ML PO SYRP: 5.0000 mL | ORAL_SOLUTION | Freq: Four times a day (QID) | ORAL | Status: AC | PRN

## 2013-11-14 MED ORDER — OXYMETAZOLINE HCL 0.05 % NA SOLN: 2.0000 | Freq: Two times a day (BID) | NASAL | Status: DC

## 2013-11-14 MED ORDER — BECLOMETHASONE DIPROPIONATE 80 MCG/ACT NA AERS: 1.0000 | INHALATION_SPRAY | Freq: Every day | NASAL | Status: DC

## 2013-11-14 MED ORDER — TAMSULOSIN HCL 0.4 MG PO CAPS: 0.4000 mg | ORAL_CAPSULE | Freq: Every day | ORAL | Status: DC

## 2013-11-14 MED ORDER — SIMVASTATIN 20 MG PO TABS: 20.0000 mg | ORAL_TABLET | Freq: Every evening | ORAL | Status: DC

## 2013-11-14 MED ORDER — BETAMETHASONE DIPROPIONATE 0.05 % EX CREA: TOPICAL_CREAM | CUTANEOUS | Status: DC

## 2013-11-14 NOTE — Progress Notes (Signed)
Pre-visit discussion using our clinic review tool. No additional management support is needed unless otherwise documented below in the visit note.  

## 2013-11-14 NOTE — Patient Instructions (Signed)
Acute bronchitis symptoms  are generally not helped by antibiotics.  Take over-the-counter expectorants and cough medications such as  Mucinex DM.  Call if there is no improvement in 5 to 7 days or if he developed worsening cough, fever, or new symptoms, such as shortness of breath or chest pain.  It is important that you exercise regularly, at least 20 minutes 3 to 4 times per week.  If you develop chest pain or shortness of breath seek  medical attention.  Return in one year for follow-up

## 2013-11-14 NOTE — Progress Notes (Signed)
Patient ID: Brandon Dixon, male   DOB: 03/26/1946, 68 y.o.   MRN: [id number]  Subjective:    Patient ID: Brandon Dixon, male    DOB: [birth year], 68 y.o.   MRN: [id number]  HPI 68  year-old patient who is seen today for a wellness exam;   medical problems include dyslipidemia mild BPH and allergic rhinitis.  He has been disabled since 2006 following left shoulder surgery.  Patient is doing quite well except for a resolving URI.  Here for Medicare AWV:  1. Risk factors based on Past M, S, F history: cardiov and ascular risk factors include dyslipidemia  2. Physical Activities: remains quite active without exercise limitations  3. Depression/mood: no h/o  depression and mood disorder  4. Hearing: no hearing deficits  5. ADL's: independent in all aspects of daily living  6. Fall Risk: low  7. Home Safety: no problems identified  8. Height, weight, &visual acuity:height and weight stable. No difficulty with visual acuity  9. Counseling: heart healthy diet, salt restricted diet. All encouraged  10. Labs ordered based on risk factors:laboratory profile, including lipid panel will be reviewed  11. Referral Coordination- not appropriate at this time. will need colonoscopy in 3 years  22. Care Plan- heart healthy diet more regular and vigorous exercise. Encouraged  13. Cognitive Assessment- alert and oriented, with normal affect     Allergies (verified):  No Known Drug Allergies   Past History:  Past Medical History:    Colonic polyps, hx of  GERD  Benign prostatic hypertrophy  Allergic rhinitis/septal deviation   Past Surgical History:    Rotator cuff repair  Vasectomy  colonoscopy November 2005, January 2011 Dr. Collene Mares  stress Myoview April 2009   Family History:   father died at 20, lung cancer  mother died at 69. CAD  5 brothers, positive diabetes ; one brother with recent diagnosis of pancreatic cancer (2013) two sisters positive for hypertension   Social History:    Married  3 daughters     Review of Systems  Constitutional: Negative for fever, chills, activity change, appetite change and fatigue.  HENT: Negative for congestion, dental problem, ear pain, hearing loss, mouth sores, rhinorrhea, sinus pressure, sneezing, tinnitus, trouble swallowing and voice change.   Eyes: Negative for photophobia, pain, redness and visual disturbance.  Respiratory: Negative for apnea, cough, choking, chest tightness, shortness of breath and wheezing.   Cardiovascular: Negative for chest pain, palpitations and leg swelling.  Gastrointestinal: Negative for nausea, vomiting, abdominal pain, diarrhea, constipation, blood in stool, abdominal distention, anal bleeding and rectal pain.  Genitourinary: Negative for dysuria, urgency, frequency, hematuria, flank pain, decreased urine volume, discharge, penile swelling, scrotal swelling, difficulty urinating, genital sores and testicular pain.  Musculoskeletal: Negative for arthralgias, back pain, gait problem, joint swelling, myalgias, neck pain and neck stiffness.  Skin: Negative for color change, rash and wound.  Neurological: Negative for dizziness, tremors, seizures, syncope, facial asymmetry, speech difficulty, weakness, light-headedness, numbness and headaches.  Hematological: Negative for adenopathy. Does not bruise/bleed easily.  Psychiatric/Behavioral: Negative for suicidal ideas, hallucinations, behavioral problems, confusion, sleep disturbance, self-injury, dysphoric mood, decreased concentration and agitation. The patient is not nervous/anxious.   been     Objective:   Physical Exam  Constitutional: He appears well-developed and well-nourished.  HENT:  Head: Normocephalic and atraumatic.  Right Ear: External ear normal.  Left Ear: External ear normal.  Nose: Nose normal.  Mouth/Throat: Oropharynx is clear and moist.  Congested turbinates  Eyes: Conjunctivae and  EOM are normal. Pupils are equal, round, and  reactive to light. No scleral icterus.  Neck: Normal range of motion. Neck supple. No JVD present. No thyromegaly present.  Cardiovascular: Regular rhythm, normal heart sounds and intact distal pulses.  Exam reveals no gallop and no friction rub.   No murmur heard. Pulmonary/Chest: Effort normal and breath sounds normal. He exhibits no tenderness.  Suggestion of mild gynecomastia  Abdominal: Soft. Bowel sounds are normal. He exhibits no distension and no mass. There is no tenderness.  Genitourinary: Penis normal.  +2  enlarged  Musculoskeletal: Normal range of motion. He exhibits no edema and no tenderness.  Lymphadenopathy:    He has no cervical adenopathy.  Neurological: He is alert. He has normal reflexes. No cranial nerve deficit. Coordination normal.  Skin: Skin is warm and dry. No rash noted.  Mild digital clubbing  Psychiatric: He has a normal mood and affect. His behavior is normal.          Assessment & Plan:  Preventive health examination History colonic polyps Dyslipidemia Allergic rhinitis.  Symptomatic BPH.  Resolving URI with cough. Will treat symptomatically  Laboratory profile reviewed  Regular exercise regimen encouraged  Heart healthy diet encouraged  Continue statin therapy

## 2013-11-16 ENCOUNTER — Telehealth: Payer: Self-pay | Admitting: Internal Medicine

## 2013-11-16 NOTE — Telephone Encounter (Signed)
Spoke to pt told him Qnasal was denied by insurance you will need to contact insurance to see which nasal spray will be covered. Pt verbalized understanding and stated he just got two prescriptions for other nasal sprays and will try if they do not work will check insurance and get back to me. Told him okay.

## 2013-11-16 NOTE — Telephone Encounter (Signed)
I received a fax denying Qnasl.  It states pt must try and fail the following formulary alternatives:  Flunisolide spray, fluticasone spray triamcinolone spray, Nasonex .   Requirement:  Trial and failure of 3 or more in a class with at least 3 alternatives, 2 in a class with 2 alternatives, or 1 in a class with only 1 alternative.

## 2013-11-30 ENCOUNTER — Telehealth: Payer: Self-pay | Admitting: Internal Medicine

## 2013-11-30 NOTE — Telephone Encounter (Signed)
Pt brought in wellness form from insurance co for Dr K to fill out. MD then to fax to insurance com.  Pt forgot to make himself a copy and now pt needs a copy . Do you still have or has it been sent to scan? If we have, can we fax to pt at: [phone]  pt will need to be notified prior to faxing so he can turn fax on.

## 2013-11-30 NOTE — Telephone Encounter (Signed)
Spoke to pt told him formed was faxed on 1/27. I can mail a copy to your home. Pt said yes, that would be fine. Told him okay will put in the mail. Mailed copy of form to pt.

## 2013-12-27 ENCOUNTER — Other Ambulatory Visit: Payer: Self-pay | Admitting: Internal Medicine

## 2014-01-15 ENCOUNTER — Other Ambulatory Visit: Payer: Self-pay | Admitting: Internal Medicine

## 2014-01-18 ENCOUNTER — Other Ambulatory Visit: Payer: Self-pay

## 2014-02-12 ENCOUNTER — Encounter: Payer: Self-pay | Admitting: Internal Medicine

## 2014-02-12 ENCOUNTER — Ambulatory Visit (INDEPENDENT_AMBULATORY_CARE_PROVIDER_SITE_OTHER): Payer: BC Managed Care – PPO | Admitting: Internal Medicine

## 2014-02-12 VITALS — BP 150/90 | HR 85 | Temp 98.6°F | Resp 20 | Ht 71.5 in | Wt 174.0 lb

## 2014-02-12 DIAGNOSIS — M25561 Pain in right knee: Principal | ICD-10-CM

## 2014-02-12 DIAGNOSIS — M25569 Pain in unspecified knee: Secondary | ICD-10-CM | POA: Diagnosis not present

## 2014-02-12 DIAGNOSIS — M25559 Pain in unspecified hip: Secondary | ICD-10-CM | POA: Diagnosis not present

## 2014-02-12 DIAGNOSIS — M25551 Pain in right hip: Principal | ICD-10-CM

## 2014-02-12 DIAGNOSIS — E7800 Pure hypercholesterolemia, unspecified: Secondary | ICD-10-CM

## 2014-02-12 DIAGNOSIS — J309 Allergic rhinitis, unspecified: Secondary | ICD-10-CM | POA: Diagnosis not present

## 2014-02-12 DIAGNOSIS — M25552 Pain in left hip: Principal | ICD-10-CM

## 2014-02-12 DIAGNOSIS — K219 Gastro-esophageal reflux disease without esophagitis: Secondary | ICD-10-CM | POA: Diagnosis not present

## 2014-02-12 DIAGNOSIS — G8929 Other chronic pain: Secondary | ICD-10-CM

## 2014-02-12 DIAGNOSIS — M25562 Pain in left knee: Principal | ICD-10-CM

## 2014-02-12 LAB — CBC WITH DIFFERENTIAL/PLATELET
Basophils Absolute: 0 10*3/uL (ref 0.0–0.1)
Basophils Relative: 0.4 % (ref 0.0–3.0)
Eosinophils Absolute: 0.1 10*3/uL (ref 0.0–0.7)
Eosinophils Relative: 1.2 % (ref 0.0–5.0)
HCT: 43 % (ref 39.0–52.0)
Hemoglobin: 14.4 g/dL (ref 13.0–17.0)
Lymphocytes Relative: 17.1 % (ref 12.0–46.0)
Lymphs Abs: 1.9 10*3/uL (ref 0.7–4.0)
MCHC: 33.4 g/dL (ref 30.0–36.0)
MCV: 88.9 fl (ref 78.0–100.0)
Monocytes Absolute: 1 10*3/uL (ref 0.1–1.0)
Monocytes Relative: 9.5 % (ref 3.0–12.0)
Neutro Abs: 7.9 10*3/uL — ABNORMAL HIGH (ref 1.4–7.7)
Neutrophils Relative %: 71.8 % (ref 43.0–77.0)
Platelets: 312 10*3/uL (ref 150.0–400.0)
RBC: 4.84 Mil/uL (ref 4.22–5.81)
RDW: 13.1 % (ref 11.5–14.6)
WBC: 11 10*3/uL — ABNORMAL HIGH (ref 4.5–10.5)

## 2014-02-12 LAB — SEDIMENTATION RATE: Sed Rate: 50 mm/hr — ABNORMAL HIGH (ref 0–22)

## 2014-02-12 MED ORDER — PREDNISONE 20 MG PO TABS: 20.0000 mg | ORAL_TABLET | Freq: Two times a day (BID) | ORAL | Status: DC

## 2014-02-12 NOTE — Patient Instructions (Signed)
Return in 2 weeks for follow-up

## 2014-02-12 NOTE — Progress Notes (Signed)
Subjective:    Patient ID: Brandon Dixon, male    DOB: [birth year], 68 y.o.   MRN: [id number]  HPI  67 year old patient has chronic medical problems include dyslipidemia, and BPH.  He also has a history of allergic rhinitis. The patient complains of generalized body aches for the past 3 or 4 weeks.  He generally describes a stiffness and pain in the pelvic region.  Shoulders and neck.  He also describes some associated headaches. He has been seen by dermatology due to a skin rash that was felt to be related to multiple insect bites.  No tick exposure His allergy symptoms have been stable. No fever, weight loss, anorexia, or other constitutional complaints  He feels his pain and stiffness improves with activity.  He states that he is quite symptomatic when he first awakes in the morning.  No pain or stiffness involving the hands or wrists  Past Medical History  Diagnosis Date  . ALLERGIC RHINITIS 09/26/2007  . BENIGN PROSTATIC HYPERTROPHY 05/27/2007  . COLONIC POLYPS, HX OF 05/27/2007  . DERMATITIS 08/25/2010  . GERD 05/27/2007  . HYPERCHOLESTEROLEMIA 09/26/2007    History   Social History  . Marital Status: Married    Spouse Name: N/A    Number of Children: N/A  . Years of Education: N/A   Occupational History  . Not on file.   Social History Main Topics  . Smoking status: Former Smoker    Quit date: 10/19/2001  . Smokeless tobacco: Never Used  . Alcohol Use: Yes  . Drug Use: No  . Sexual Activity: Not on file   Other Topics Concern  . Not on file   Social History Narrative  . No narrative on file    Past Surgical History  Procedure Laterality Date  . Vasectomy    . Rotator cuff repair      Family History  Problem Relation Age of Onset  . Heart disease Mother   . Cancer Father     lung ca  . Hypertension Sister   . Diabetes Brother   . Hypertension Sister   . Diabetes Brother   . Diabetes Brother   . Diabetes Brother   . Diabetes Brother     No Known  Allergies  Current Outpatient Prescriptions on File Prior to Visit  Medication Sig Dispense Refill  . betamethasone dipropionate (DIPROLENE) 0.05 % cream APPLY TWICE DAILY AS DIRECTED PRN  45 g  3  . Multiple Vitamin (MULTIVITAMIN) tablet Take 1 tablet by mouth daily.        Marland Kitchen oxymetazoline (QC NASAL RELIEF SINUS) 0.05 % nasal spray Place 2 sprays into both nostrils 2 (two) times daily.  30 mL  5  . simvastatin (ZOCOR) 20 MG tablet Take 1 tablet (20 mg total) by mouth every evening.  90 tablet  3  . tamsulosin (FLOMAX) 0.4 MG CAPS capsule TAKE 1 CAPSULE DAILY AFTER SUPPER *ACTAVIS MFR  90 capsule  1   No current facility-administered medications on file prior to visit.    BP 150/90  Pulse 85  Temp(Src) 98.6 F (37 C) (Oral)  Resp 20  Ht 5' 11.5" (1.816 m)  Wt 174 lb (78.926 kg)  BMI 23.93 kg/m2  SpO2 98%       Review of Systems  Constitutional: Negative for fever, chills, appetite change and fatigue.  HENT: Negative for congestion, dental problem, ear pain, hearing loss, sore throat, tinnitus, trouble swallowing and voice change.   Eyes: Negative for pain, discharge  and visual disturbance.  Respiratory: Negative for cough, chest tightness, wheezing and stridor.   Cardiovascular: Negative for chest pain, palpitations and leg swelling.  Gastrointestinal: Negative for nausea, vomiting, abdominal pain, diarrhea, constipation, blood in stool and abdominal distention.  Genitourinary: Positive for frequency. Negative for urgency, hematuria, flank pain, discharge, difficulty urinating and genital sores.  Musculoskeletal: Positive for arthralgias. Negative for back pain, gait problem, joint swelling, myalgias and neck stiffness.  Skin: Negative for rash.  Neurological: Positive for headaches. Negative for dizziness, syncope, speech difficulty, weakness and numbness.  Hematological: Negative for adenopathy. Does not bruise/bleed easily.  Psychiatric/Behavioral: Negative for behavioral  problems and dysphoric mood. The patient is not nervous/anxious.        Objective:   Physical Exam  Constitutional: He is oriented to person, place, and time. He appears well-developed.  HENT:  Head: Normocephalic.  Right Ear: External ear normal.  Left Ear: External ear normal.  Eyes: Conjunctivae and EOM are normal.  Neck:  Slight decreased range of motion of the neck  Cardiovascular: Normal rate and normal heart sounds.   Pulmonary/Chest: Breath sounds normal.  Abdominal: Bowel sounds are normal.  Musculoskeletal: Normal range of motion. He exhibits no edema and no tenderness.  No signs of active synovitis  Neurological: He is alert and oriented to person, place, and time.  Psychiatric: He has a normal mood and affect. His behavior is normal.          Assessment & Plan:   Arthralgias of 3 weeks duration, associated with headaches.  We'll check a CBC and sed.  Rate. We'll get a brief trial of prednisone.  Recheck 2 weeks Dyslipidemia Allergic rhinitis

## 2014-02-19 ENCOUNTER — Telehealth: Payer: Self-pay | Admitting: Internal Medicine

## 2014-02-19 NOTE — Telephone Encounter (Signed)
Please advise 

## 2014-02-19 NOTE — Telephone Encounter (Signed)
Please call/notify patient that lab/test/procedure revealed slight elevation in inflammatory markers.  Complete prednisone therapy and office followup in one or 2 weeks as scheduled.  We'll discuss further

## 2014-02-19 NOTE — Telephone Encounter (Signed)
Pt wanting to know results from his labs that were done last week. Pt states a message can be left if there is no answer.

## 2014-02-20 ENCOUNTER — Other Ambulatory Visit: Payer: Self-pay | Admitting: Internal Medicine

## 2014-02-20 NOTE — Telephone Encounter (Signed)
Spoke to patient told him lab result revealed slight elevation in inflammatory markers. Complete prednisone therapy and office followup in one or 2 weeks as scheduled. We'll discuss further as visit per Dr. Raliegh Ip. Pt verbalized understanding.

## 2014-02-26 ENCOUNTER — Ambulatory Visit (INDEPENDENT_AMBULATORY_CARE_PROVIDER_SITE_OTHER): Payer: BC Managed Care – PPO | Admitting: Internal Medicine

## 2014-02-26 ENCOUNTER — Encounter: Payer: Self-pay | Admitting: Internal Medicine

## 2014-02-26 VITALS — BP 120/72 | HR 79 | Temp 98.7°F | Resp 18 | Ht 71.5 in | Wt 174.0 lb

## 2014-02-26 DIAGNOSIS — M25559 Pain in unspecified hip: Secondary | ICD-10-CM

## 2014-02-26 DIAGNOSIS — G8929 Other chronic pain: Secondary | ICD-10-CM

## 2014-02-26 DIAGNOSIS — M25569 Pain in unspecified knee: Secondary | ICD-10-CM | POA: Diagnosis not present

## 2014-02-26 DIAGNOSIS — M25562 Pain in left knee: Principal | ICD-10-CM

## 2014-02-26 DIAGNOSIS — M353 Polymyalgia rheumatica: Secondary | ICD-10-CM | POA: Diagnosis not present

## 2014-02-26 DIAGNOSIS — M25561 Pain in right knee: Principal | ICD-10-CM

## 2014-02-26 DIAGNOSIS — M25551 Pain in right hip: Principal | ICD-10-CM

## 2014-02-26 DIAGNOSIS — M25552 Pain in left hip: Principal | ICD-10-CM

## 2014-02-26 LAB — SEDIMENTATION RATE: Sed Rate: 52 mm/hr — ABNORMAL HIGH (ref 0–22)

## 2014-02-26 MED ORDER — PREDNISONE 5 MG PO TABS: ORAL_TABLET | ORAL | Status: DC

## 2014-02-26 NOTE — Progress Notes (Signed)
Pre-visit discussion using our clinic review tool. No additional management support is needed unless otherwise documented below in the visit note.  

## 2014-02-26 NOTE — Progress Notes (Signed)
Subjective:    Patient ID: Brandon Dixon, male    DOB: 12-06-1945, 68 y.o.   MRN: [id number]  HPI  68 year old patient who is seen today in followup for suspected PMR.  He presented recently with a 3-4 week history of diffuse neck, upper back, and shoulder pain and stiffness associated with low back pain and stiffness.  Denied any constitutional complaints, but did have significant early morning stiffness that improves with heat and activity.  Denied any involvement of hands or wrists or more peripheral joints.  He was placed on moderately high dose of prednisone 20 mg twice a day and he had a dramatic response after 3 doses.  At the present time.  He is off prednisone and all his symptoms have returned  Past Medical History  Diagnosis Date  . ALLERGIC RHINITIS 09/26/2007  . BENIGN PROSTATIC HYPERTROPHY 05/27/2007  . COLONIC POLYPS, HX OF 05/27/2007  . DERMATITIS 08/25/2010  . GERD 05/27/2007  . HYPERCHOLESTEROLEMIA 09/26/2007    History   Social History  . Marital Status: Married    Spouse Name: N/A    Number of Children: N/A  . Years of Education: N/A   Occupational History  . Not on file.   Social History Main Topics  . Smoking status: Former Smoker    Quit date: 10/19/2001  . Smokeless tobacco: Never Used  . Alcohol Use: Yes  . Drug Use: No  . Sexual Activity: Not on file   Other Topics Concern  . Not on file   Social History Narrative  . No narrative on file    Past Surgical History  Procedure Laterality Date  . Vasectomy    . Rotator cuff repair      Family History  Problem Relation Age of Onset  . Heart disease Mother   . Cancer Father     lung ca  . Hypertension Sister   . Diabetes Brother   . Hypertension Sister   . Diabetes Brother   . Diabetes Brother   . Diabetes Brother   . Diabetes Brother     No Known Allergies  Current Outpatient Prescriptions on File Prior to Visit  Medication Sig Dispense Refill  . betamethasone dipropionate (DIPROLENE)  0.05 % cream APPLY TWICE DAILY AS DIRECTED PRN  45 g  3  . fluticasone (FLONASE) 50 MCG/ACT nasal spray Place 2 sprays into both nostrils as needed.       Marland Kitchen ibuprofen (ADVIL,MOTRIN) 200 MG tablet Take 400 mg by mouth every 6 (six) hours as needed.      . Multiple Vitamin (MULTIVITAMIN) tablet Take 1 tablet by mouth daily.        . simvastatin (ZOCOR) 20 MG tablet Take 1 tablet (20 mg total) by mouth every evening.  90 tablet  3  . tamsulosin (FLOMAX) 0.4 MG CAPS capsule TAKE 1 CAPSULE DAILY AFTER SUPPER *ACTAVIS MFR  90 capsule  1   No current facility-administered medications on file prior to visit.    BP 120/72  Pulse 79  Temp(Src) 98.7 F (37.1 C) (Oral)  Resp 18  Ht 5' 11.5" (1.816 m)  Wt 174 lb (78.926 kg)  BMI 23.93 kg/m2  SpO2 98%       Review of Systems  Constitutional: Negative for fever, chills, appetite change and fatigue.  HENT: Negative for congestion, dental problem, ear pain, hearing loss, sore throat, tinnitus, trouble swallowing and voice change.   Eyes: Negative for pain, discharge and visual disturbance.  Respiratory: Negative for  cough, chest tightness, wheezing and stridor.   Cardiovascular: Negative for chest pain, palpitations and leg swelling.  Gastrointestinal: Negative for nausea, vomiting, abdominal pain, diarrhea, constipation, blood in stool and abdominal distention.  Genitourinary: Negative for urgency, hematuria, flank pain, discharge, difficulty urinating and genital sores.  Musculoskeletal: Positive for arthralgias, back pain and neck pain. Negative for gait problem, joint swelling, myalgias and neck stiffness.  Skin: Negative for rash.  Neurological: Negative for dizziness, syncope, speech difficulty, weakness, numbness and headaches.  Hematological: Negative for adenopathy. Does not bruise/bleed easily.  Psychiatric/Behavioral: Negative for behavioral problems and dysphoric mood. The patient is not nervous/anxious.        Objective:    Physical Exam  Constitutional: He appears well-developed and well-nourished. No distress.  Musculoskeletal:  No active synovitis involving the small joints of the hands or wrists          Assessment & Plan:   Probable PMR.  Will screen for R. A. and resume prednisone at a dose of 10 mg every morning.  If he has a complete, response, we'll decrease to 7 point 5 mg every morning.  Recheck 4 weeks.  We'll check sedimentation rate at that time

## 2014-02-26 NOTE — Patient Instructions (Signed)
Polymyalgia Rheumatica Polymyalgia rheumatica (also called PMR or polymyalgia) is a rheumatologic (arthritic) condition that causes pain and morning stiffness in your neck, shoulders, and hips. It is an inflammatory condition. In some people, inflammation of certain structures in the shoulder, hips, or other joints can be seen on special testing. It does not cause joint destruction, as occurs in other arthritic conditions. It usually occurs after 68 years of age, and is more common as you age. It can be confused with several other diseases, but it is usually easily treated. People with PMR often have, or can develop, a more severe rheumatologic condition called giant cell arteritis (also called CGA or temporal arteritis).  CAUSES  The exact cause of PMR is not known.   There are genetic factors involved.  Viruses have been suspected in the cause of PMR. This has not been proven. SYMPTOMS   Aching, pain, and morning stiffness your neck, both shoulders, or both hips.  Symptoms usually start slowly and build gradually.  Morning stiffness usually lasts at least 30 minutes.  Swelling and tenderness in other joints of the arms, hands, legs, and feet may occur.  Swelling and inflammation in the wrists can cause nerve inflammation at the wrist (carpal tunnel syndrome).  You may also have low grade fever, fatigue, weakness, decreased appetite and weight loss. DIAGNOSIS   Your caregiver may suspect that you have PMR based on your description of your symptoms and on your exam.  Your caregiver will examine you to be sure you do not have diseases that can be confused with PMR. These diseases include rheumatoid arthritis, fibromyalgia, or thyroid disease.  Your caregiver should check for signs of giant cell arteritis. This can cause serious complications such as blindness.  Lab tests can help confirm that you have PMR and not other diseases, but are sometimes inconclusive.  X-rays cannot show PMR.  However, it can identify other diseases like rheumatoid arthritis. Your caregiver may have you see a specialist in arthritis and inflammatory diseases (rheumatologist). TREATMENT  The goal of treatment is relief of symptoms. Treatment does not shorten the course of the illness or prevent complications. With proper treatment, you usually feel better almost right away.   The initial treatment of PMR is usually a cortisone (steroid) medication. Your caregiver will help determine a starting dose. The dose is gradually reduced every few weeks to months. Treatment usually lasts one to three years.  Other stronger medications are rarely needed. They will only be prescribed if your symptoms do not get better on cortisone medication alone, or if they recur as the dose is reduced.  Cortisone medication can have different side effects. With the doses of cortisone needed for PMR, the side effects can affect bones and joints, blood sugar control in diabetes, and mood changes. Discuss this with your caregiver.  Your caregiver will evaluate you regularly during your treatment. They will do this in order to assess progress and to check for complications of the illness or treatment.  Physical therapy is sometimes useful. This is especially true if your joints are still stiff after other symptoms have improved. HOME CARE INSTRUCTIONS   Follow your caregiver's instructions. Do not change your dose of cortisone medication on your own.  Keep your appointments for follow-up lab tests and caregiver visits. Your lab tests need to be monitored. You must get checked periodically for giant cell arteritis.  Follow your caregiver's guidance regarding physical activity (usually no restrictions are needed) or physical therapy.  Your caregiver  may have instructions to prevent or check for side effects from cortisone medication (including bone density testing or treatment). Follow their instructions carefully. SEEK MEDICAL  CARE IF:   You develop any side effects from treatment. Side effects can include:  Elevated blood pressure.  High blood sugar (or worsening of diabetes, if you are diabetic).  Difficulty fighting off infections.  Weight gain.  Weakness of the bones (osteoporosis).  Your aches, pains, morning stiffness, or other symptoms get worse with time. This is especially true after your dose of cortisone is reduced.  You develop new joint symptoms (pain, swelling, etc.) SEEK IMMEDIATE MEDICAL CARE IF:   You develop a severe headache.  You start vomiting.  You have problems with your vision.  You have an oral temperature above 102 F (38.9 C), not controlled by medicine. Document Released: 11/12/2004 Document Revised: 09/21/2012 Document Reviewed: 02/25/2009 [facility] Patient Information 2014 [place], [place].

## 2014-02-27 LAB — CYCLIC CITRUL PEPTIDE ANTIBODY, IGG: Cyclic Citrullin Peptide Ab: <2 U/mL (ref 0.0–5.0)

## 2014-02-27 LAB — RHEUMATOID FACTOR: Rhuematoid fact SerPl-aCnc: <10 IU/mL (ref <=14)

## 2014-03-26 ENCOUNTER — Ambulatory Visit (INDEPENDENT_AMBULATORY_CARE_PROVIDER_SITE_OTHER): Payer: BC Managed Care – PPO | Admitting: Internal Medicine

## 2014-03-26 ENCOUNTER — Encounter: Payer: Self-pay | Admitting: Internal Medicine

## 2014-03-26 VITALS — BP 120/70 | HR 77 | Temp 98.5°F | Resp 20 | Ht 71.5 in | Wt 171.0 lb

## 2014-03-26 DIAGNOSIS — E7800 Pure hypercholesterolemia, unspecified: Secondary | ICD-10-CM

## 2014-03-26 DIAGNOSIS — M353 Polymyalgia rheumatica: Secondary | ICD-10-CM

## 2014-03-26 LAB — SEDIMENTATION RATE: Sed Rate: 38 mm/hr — ABNORMAL HIGH (ref 0–22)

## 2014-03-26 MED ORDER — PREDNISONE 2.5 MG PO TABS: 2.5000 mg | ORAL_TABLET | Freq: Every day | ORAL | Status: DC

## 2014-03-26 NOTE — Progress Notes (Signed)
   Subjective:    Patient ID: Brandon Dixon, male    DOB: [birth year], 69 y.o.   MRN: [id number]  HPI 68 year old patient seen today in followup with PMR.  Symptoms are nicely controlled on prednisone 10 mg daily.  About 6 weeks ago.  He down titrated to 5 mg and did quite poorly after 3 days.  Past Medical History  Diagnosis Date  . ALLERGIC RHINITIS 09/26/2007  . BENIGN PROSTATIC HYPERTROPHY 05/27/2007  . COLONIC POLYPS, HX OF 05/27/2007  . DERMATITIS 08/25/2010  . GERD 05/27/2007  . HYPERCHOLESTEROLEMIA 09/26/2007    History   Social History  . Marital Status: Married    Spouse Name: N/A    Number of Children: N/A  . Years of Education: N/A   Occupational History  . Not on file.   Social History Main Topics  . Smoking status: Former Smoker    Quit date: 10/19/2001  . Smokeless tobacco: Never Used  . Alcohol Use: Yes  . Drug Use: No  . Sexual Activity: Not on file   Other Topics Concern  . Not on file   Social History Narrative  . No narrative on file    Past Surgical History  Procedure Laterality Date  . Vasectomy    . Rotator cuff repair      Family History  Problem Relation Age of Onset  . Heart disease Mother   . Cancer Father     lung ca  . Hypertension Sister   . Diabetes Brother   . Hypertension Sister   . Diabetes Brother   . Diabetes Brother   . Diabetes Brother   . Diabetes Brother     No Known Allergies  Current Outpatient Prescriptions on File Prior to Visit  Medication Sig Dispense Refill  . betamethasone dipropionate (DIPROLENE) 0.05 % cream APPLY TWICE DAILY AS DIRECTED PRN  45 g  3  . fluticasone (FLONASE) 50 MCG/ACT nasal spray Place 2 sprays into both nostrils as needed.       Marland Kitchen ibuprofen (ADVIL,MOTRIN) 200 MG tablet Take 400 mg by mouth every 6 (six) hours as needed.      . Multiple Vitamin (MULTIVITAMIN) tablet Take 1 tablet by mouth daily.        . predniSONE (DELTASONE) 5 MG tablet 2 tablets every morning  90 tablet  0  .  simvastatin (ZOCOR) 20 MG tablet Take 1 tablet (20 mg total) by mouth every evening.  90 tablet  3  . tamsulosin (FLOMAX) 0.4 MG CAPS capsule TAKE 1 CAPSULE DAILY AFTER SUPPER *ACTAVIS MFR  90 capsule  1   No current facility-administered medications on file prior to visit.    BP 120/70  Pulse 77  Temp(Src) 98.5 F (36.9 C) (Oral)  Resp 20  Ht 5' 11.5" (1.816 m)  Wt 171 lb (77.565 kg)  BMI 23.52 kg/m2  SpO2 98%      Review of Systems  Musculoskeletal: Positive for arthralgias and myalgias.  Neurological: Positive for weakness.       Objective:   Physical Exam  Constitutional: He appears well-developed and well-nourished. No distress.          Assessment & Plan:   PMR.  Stable on 10 mg of prednisone daily.  We'll check a sedimentation rate.  If this is normal we'll consider a very gradual dose reduction to 10 mg every other day alternating with 7 point 5 mg every other day.  Recheck 3 months

## 2014-03-26 NOTE — Progress Notes (Signed)
Pre-visit discussion using our clinic review tool. No additional management support is needed unless otherwise documented below in the visit note.  

## 2014-04-04 ENCOUNTER — Telehealth: Payer: Self-pay | Admitting: Internal Medicine

## 2014-04-04 ENCOUNTER — Emergency Department (INDEPENDENT_AMBULATORY_CARE_PROVIDER_SITE_OTHER)
Admission: EM | Admit: 2014-04-04 | Discharge: 2014-04-04 | Disposition: A | Discharge status: Home / Self Care | Payer: BC Managed Care – PPO | Source: Home / Self Care | Attending: Family Medicine | Admitting: Family Medicine

## 2014-04-04 ENCOUNTER — Ambulatory Visit: Payer: Self-pay | Admitting: Internal Medicine

## 2014-04-04 ENCOUNTER — Encounter (HOSPITAL_COMMUNITY): Payer: Self-pay | Admitting: Emergency Medicine

## 2014-04-04 DIAGNOSIS — Z0289 Encounter for other administrative examinations: Secondary | ICD-10-CM

## 2014-04-04 DIAGNOSIS — M79606 Pain in leg, unspecified: Secondary | ICD-10-CM

## 2014-04-04 DIAGNOSIS — M79609 Pain in unspecified limb: Secondary | ICD-10-CM

## 2014-04-04 NOTE — ED Notes (Addendum)
Pt assessment is expedited due to c/o lt calf pn.  Pt reports that his MD at [facility] sent him "here" with suspicion of DVT and need for ultrasound imaging that the [facility] is unable to do currently. Pt roomed, vitals obtained (WNL).

## 2014-04-04 NOTE — Telephone Encounter (Signed)
Patient Information:  Caller Name: Brandon Dixon  Phone: [phone]  Patient: Brandon Dixon, Brandon Dixon  Gender: Male  DOB: [birth year]  Age: 68 Years  PCP: Bluford Kaufmann (Family Practice > 56yrs old)  Office Follow Up:  Does the office need to follow up with this patient?: No  Instructions For The Office: N/A   Symptoms  Reason For Call & Symptoms: c/o lower left calf pain for 1-39mos; says it is increasingly painful, especially after walking; feels swollen, but is not visibly so; spots on the leg may be slightly darker than the rt leg; denies redness, warmth or tenderness; denies SOB; had an ACL issue yrs ago on this leg; had a dizzy spell 6/11 that made him have to sit down while doing some yard work  Reviewed Health History In EMR: Yes  Reviewed Medications In EMR: Yes  Reviewed Allergies In EMR: Yes  Reviewed Surgeries / Procedures: Yes  Date of Onset of Symptoms: Unknown  Guideline(s) Used:  Leg Pain  Disposition Per Guideline:   Go to ED Now (or to Office with PCP Approval)  Reason For Disposition Reached:   Thigh or calf pain in only one leg and present > 1 hour  Advice Given:  N/A  Patient Will Follow Care Advice:  YES  Appointment Scheduled:  04/04/2014 15:15:00 Appointment Scheduled Provider:  Shawna Orleans, Doe-Hyun Her[facility] [place]) (Adults only)

## 2014-04-04 NOTE — Discharge Instructions (Signed)
Thank you for coming in today. Show up at the main hospital tomorrow at 8:30am to have the ultrasound.  I will call you with results.   Intermittent Claudication Blockage of leg arteries results from poor circulation of blood in the leg arteries. This produces an aching, tired, and sometimes burning pain in the legs that is brought on by exercise and made better by rest. Claudication refers to the limping that happens from leg cramps. It is also referred to as Vaso-occlusive disease of the legs, arterial insufficiency of the legs, recurrent leg pain, recurrent leg cramping and calf pain with exercise.  CAUSES  This condition is due to narrowing or blockage of the arteries (muscular vessels which carry blood away from the heart and around the body). Blockage of arteries can occur anywhere in the body. If they occur in the heart, a person may experience angina (chest pain) or even a heart attack. If they occur in the neck or the brain, a person may have a stroke. Intermittent claudication is when the blockage occurs in the legs, most commonly in the calf or the foot.  Atherosclerosis, or blockage of arteries, can occur for many reasons. Some of these are smoking, diabetes, and high cholesterol. SYMPTOMS  Intermittent claudication may occur in both legs, and it often continues to get worse over time. However, some people complain only of weakness in the legs when walking, or a feeling of "tiredness" in the buttocks. Impotence (not able to have an erection) is an occasional complaint in men. Pain while resting is uncommon.  WHAT TO EXPECT AT [facility] PROVIDER'S OFFICE: Your medical history will be asked for and a physical examination will be performed. Medical history questions documenting claudication in detail may include:   Time pattern  Do you have leg cramps at night (nocturnal cramps)?  How often does leg pain with cramping occur?  Is it getting worse?  What is the quality of the  pain?  Is the pain sharp?  Is there an aching pain with the cramps?  Aggravating factors  Is it worse after you exercise?  Is it worse after you are standing for a while?  Do you smoke? How much?  Do you drink alcohol? How much?  Are you diabetic? How well is your blood sugar controlled?  Other  What other symptoms are also present?  Has there been impotence (men)?  Is there pain in the back?  Is there a darkening of the skin of the legs, feet or toes?  Is there weakness or paralysis of the legs? The physical examination may include evaluation of the femoral pulse (in the groin) and the other areas where the pulse can be felt in the legs. DIAGNOSIS  Diagnostic tests that may be performed include:  Blood pressure measured in arms and legs for comparison.  Doppler ultrasonography on the legs and the heart.  Duplex Doppler/ultrasound exam of extremity to visualize arterial blood flow.  ECG- to evaluate the activity of your heart.  Aortography- to visualize blockages in your arteries. TREATMENT Surgical treatment may be suggested if claudication interferes with the patient's activities or work, and if the diseased arteries do not seem to be improving after treatment. Be aware that this condition can worsen over time and you should carefully monitor your condition. HOME CARE INSTRUCTIONS  Talk to your caregiver about the cause of your leg cramping and about what to do at home to relieve it.  A healthy diet is important to lessen  the likeliness of atherosclerosis.  A program of daily walking for short periods, and stopping for pain or cramping, may help improve function.  It is important to stop smoking.  Avoid putting hot or cold items on legs.  Avoid tight shoes. SEEK MEDICAL CARE IF: There are many other causes of leg pain such as arthritis or low blood potassium. However, some causes of leg pain may be life threatening such as a blood clot in the legs. Seek  medical attention if you have:  Leg pain that does not go away.  Legs that may be red, hot or swollen.  Ulcers or sores appear on your ankle or foot.  Any chest pain or shortness of breath accompanying leg pain.  Diabetes.  You are pregnant. SEEK IMMEDIATE MEDICAL CARE IF:   Your leg pain becomes severe or will not go away.  Your foot turns blue or a dark color.  Your leg becomes red, hot or swollen or you develop a fever over 102F.  Any chest pain or shortness of breath accompanying leg pain. MAKE SURE YOU:   Understand these instructions.  Will watch your condition.  Will get help right away if you are not doing well or get worse. Document Released: 08/07/2004 Document Revised: 12/28/2011 Document Reviewed: 05/25/2008 [facility] Patient Information 2015 [place], [place]. This information is not intended to replace advice given to you by your health care provider. Make sure you discuss any questions you have with your health care provider.
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
## 2014-04-05 ENCOUNTER — Encounter: Payer: Self-pay | Admitting: Internal Medicine

## 2014-04-05 ENCOUNTER — Telehealth (HOSPITAL_COMMUNITY): Payer: Self-pay | Admitting: Family Medicine

## 2014-04-05 ENCOUNTER — Other Ambulatory Visit (HOSPITAL_COMMUNITY): Payer: Self-pay | Admitting: Family Medicine

## 2014-04-05 ENCOUNTER — Ambulatory Visit (HOSPITAL_COMMUNITY)
Admission: RE | Admit: 2014-04-05 | Discharge: 2014-04-05 | Disposition: A | Discharge status: Home / Self Care | Payer: BC Managed Care – PPO | Source: Ambulatory Visit | Attending: Family Medicine | Admitting: Family Medicine

## 2014-04-05 ENCOUNTER — Ambulatory Visit (INDEPENDENT_AMBULATORY_CARE_PROVIDER_SITE_OTHER): Payer: BC Managed Care – PPO | Admitting: Internal Medicine

## 2014-04-05 VITALS — BP 148/70 | HR 94 | Temp 98.8°F | Resp 20 | Ht 71.5 in | Wt 171.0 lb

## 2014-04-05 DIAGNOSIS — M353 Polymyalgia rheumatica: Secondary | ICD-10-CM

## 2014-04-05 DIAGNOSIS — I739 Peripheral vascular disease, unspecified: Secondary | ICD-10-CM

## 2014-04-05 DIAGNOSIS — M25569 Pain in unspecified knee: Secondary | ICD-10-CM

## 2014-04-05 DIAGNOSIS — I70219 Atherosclerosis of native arteries of extremities with intermittent claudication, unspecified extremity: Secondary | ICD-10-CM

## 2014-04-05 DIAGNOSIS — M79609 Pain in unspecified limb: Secondary | ICD-10-CM | POA: Insufficient documentation

## 2014-04-05 MED ORDER — SIMVASTATIN 40 MG PO TABS: ORAL_TABLET | ORAL | Status: DC

## 2014-04-05 MED ORDER — ASPIRIN 81 MG PO TABS: 81.0000 mg | ORAL_TABLET | Freq: Every day | ORAL | Status: DC

## 2014-04-05 NOTE — Telephone Encounter (Signed)
Noted  

## 2014-04-05 NOTE — Progress Notes (Signed)
Subjective:    Patient ID: Brandon Dixon, male    DOB: 1946/03/01, 68 y.o.   MRN: [id number]  HPI  BP Readings from Last 3 Encounters:  04/05/14 148/70  04/04/14 112/71  03/26/14 77/13   68 year old patient who has an approximate two-month history of intermittent left leg pain.  Over the past 2 weeks, he has had very predictable left leg claudication with walking.  The calf pain, which resolves with rest. Arterial Doppler studies were performed earlier today and the official report has not been signed off.  The preliminary report revealed a left femoral artery stenosis of 50-to 99% and a left ABI of 0.61.  Vascular risk factors include a history of dyslipidemia.  He has been on simvastatin 20 mg daily  Past medical history is pertinent for a recent diagnosis of PMR.  Presently on 10 mg of prednisone daily.  Past Medical History  Diagnosis Date  . ALLERGIC RHINITIS 09/26/2007  . BENIGN PROSTATIC HYPERTROPHY 05/27/2007  . COLONIC POLYPS, HX OF 05/27/2007  . DERMATITIS 08/25/2010  . GERD 05/27/2007  . HYPERCHOLESTEROLEMIA 09/26/2007    History   Social History  . Marital Status: Married    Spouse Name: N/A    Number of Children: N/A  . Years of Education: N/A   Occupational History  . Not on file.   Social History Main Topics  . Smoking status: Former Smoker    Quit date: 10/19/2001  . Smokeless tobacco: Never Used  . Alcohol Use: Yes  . Drug Use: No  . Sexual Activity: Not on file   Other Topics Concern  . Not on file   Social History Narrative  . No narrative on file    Past Surgical History  Procedure Laterality Date  . Vasectomy    . Rotator cuff repair      Family History  Problem Relation Age of Onset  . Heart disease Mother   . Cancer Father     lung ca  . Hypertension Sister   . Diabetes Brother   . Hypertension Sister   . Diabetes Brother   . Diabetes Brother   . Diabetes Brother   . Diabetes Brother     No Known Allergies  Current  Outpatient Prescriptions on File Prior to Visit  Medication Sig Dispense Refill  . betamethasone dipropionate (DIPROLENE) 0.05 % cream APPLY TWICE DAILY AS DIRECTED PRN  45 g  3  . fluticasone (FLONASE) 50 MCG/ACT nasal spray Place 2 sprays into both nostrils as needed.       Marland Kitchen ibuprofen (ADVIL,MOTRIN) 200 MG tablet Take 400 mg by mouth every 6 (six) hours as needed.      . Multiple Vitamin (MULTIVITAMIN) tablet Take 1 tablet by mouth daily.        . predniSONE (DELTASONE) 5 MG tablet 2 tablets every morning  90 tablet  0  . tamsulosin (FLOMAX) 0.4 MG CAPS capsule TAKE 1 CAPSULE DAILY AFTER SUPPER *ACTAVIS MFR  90 capsule  1  . predniSONE (DELTASONE) 2.5 MG tablet Take 1 tablet (2.5 mg total) by mouth daily with breakfast.  90 tablet  2   No current facility-administered medications on file prior to visit.    BP 148/70  Pulse 94  Temp(Src) 98.8 F (37.1 C) (Oral)  Resp 20  Ht 5' 11.5" (1.816 m)  Wt 171 lb (77.565 kg)  BMI 23.52 kg/m2  SpO2 98%      Review of Systems  Musculoskeletal: Positive for gait problem.  Objective:   Physical Exam  Constitutional: He appears well-developed and well-nourished. No distress.  Blood pressure 130/70  Cardiovascular:  Femoral pulses appear to be intact without bruits  The right dorsalis pedis pulse was full The left dorsalis pedis pulse was nonpalpable Both posterior tibial pulses were difficult to palpate  No left foot ischemic changes noted          Assessment & Plan:   Left leg claudication. Will place on daily aspirin and intensify statin therapy. Vascular surgical consult be obtained  Information concerning claudication, dispensed.  Discussed with patient and wife at length
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
## 2014-04-05 NOTE — ED Notes (Signed)
Called patient regarding his Vascular study.  ABI 0.61 LT With a 50-99% stenosis at the abductor canal in thigh.  PT has an appointment with PCP today at [place], MD 04/05/14 1043

## 2014-04-05 NOTE — Progress Notes (Signed)
Pre-visit discussion using our clinic review tool. No additional management support is needed unless otherwise documented below in the visit note.  

## 2014-04-05 NOTE — Patient Instructions (Signed)
Limit your sodium (Salt) intake  Vascular surgery consultation as discussedIntermittent Claudication Blockage of leg arteries results from poor circulation of blood in the leg arteries. This produces an aching, tired, and sometimes burning pain in the legs that is brought on by exercise and made better by rest. Claudication refers to the limping that happens from leg cramps. It is also referred to as Vaso-occlusive disease of the legs, arterial insufficiency of the legs, recurrent leg pain, recurrent leg cramping and calf pain with exercise.  CAUSES  This condition is due to narrowing or blockage of the arteries (muscular vessels which carry blood away from the heart and around the body). Blockage of arteries can occur anywhere in the body. If they occur in the heart, a person may experience angina (chest pain) or even a heart attack. If they occur in the neck or the brain, a person may have a stroke. Intermittent claudication is when the blockage occurs in the legs, most commonly in the calf or the foot.  Atherosclerosis, or blockage of arteries, can occur for many reasons. Some of these are smoking, diabetes, and high cholesterol. SYMPTOMS  Intermittent claudication may occur in both legs, and it often continues to get worse over time. However, some people complain only of weakness in the legs when walking, or a feeling of "tiredness" in the buttocks. Impotence (not able to have an erection) is an occasional complaint in men. Pain while resting is uncommon.  WHAT TO EXPECT AT [facility] PROVIDER'S OFFICE: Your medical history will be asked for and a physical examination will be performed. Medical history questions documenting claudication in detail may include:   Time pattern  Do you have leg cramps at night (nocturnal cramps)?  How often does leg pain with cramping occur?  Is it getting worse?  What is the quality of the pain?  Is the pain sharp?  Is there an aching pain with the  cramps?  Aggravating factors  Is it worse after you exercise?  Is it worse after you are standing for a while?  Do you smoke? How much?  Do you drink alcohol? How much?  Are you diabetic? How well is your blood sugar controlled?  Other  What other symptoms are also present?  Has there been impotence (men)?  Is there pain in the back?  Is there a darkening of the skin of the legs, feet or toes?  Is there weakness or paralysis of the legs? The physical examination may include evaluation of the femoral pulse (in the groin) and the other areas where the pulse can be felt in the legs. DIAGNOSIS  Diagnostic tests that may be performed include:  Blood pressure measured in arms and legs for comparison.  Doppler ultrasonography on the legs and the heart.  Duplex Doppler/ultrasound exam of extremity to visualize arterial blood flow.  ECG- to evaluate the activity of your heart.  Aortography- to visualize blockages in your arteries. TREATMENT Surgical treatment may be suggested if claudication interferes with the patient's activities or work, and if the diseased arteries do not seem to be improving after treatment. Be aware that this condition can worsen over time and you should carefully monitor your condition. HOME CARE INSTRUCTIONS  Talk to your caregiver about the cause of your leg cramping and about what to do at home to relieve it.  A healthy diet is important to lessen the likeliness of atherosclerosis.  A program of daily walking for short periods, and stopping for pain or  cramping, may help improve function.  It is important to stop smoking.  Avoid putting hot or cold items on legs.  Avoid tight shoes. SEEK MEDICAL CARE IF: There are many other causes of leg pain such as arthritis or low blood potassium. However, some causes of leg pain may be life threatening such as a blood clot in the legs. Seek medical attention if you have:  Leg pain that does not go  away.  Legs that may be red, hot or swollen.  Ulcers or sores appear on your ankle or foot.  Any chest pain or shortness of breath accompanying leg pain.  Diabetes.  You are pregnant. SEEK IMMEDIATE MEDICAL CARE IF:   Your leg pain becomes severe or will not go away.  Your foot turns blue or a dark color.  Your leg becomes red, hot or swollen or you develop a fever over 102F.  Any chest pain or shortness of breath accompanying leg pain. MAKE SURE YOU:   Understand these instructions.  Will watch your condition.  Will get help right away if you are not doing well or get worse. Document Released: 08/07/2004 Document Revised: 12/28/2011 Document Reviewed: 05/25/2008 [facility] Patient Information 2015 [place], [place]. This information is not intended to replace advice given to you by your health care provider. Make sure you discuss any questions you have with your health care provider.

## 2014-04-06 NOTE — Progress Notes (Signed)
VASCULAR LAB PRELIMINARY  PRELIMINARY  PRELIMINARY  PRELIMINARY  Lower extremity venous duplex and ABIS completed 04/05/2014    NICHOLS, FRANCES, RVT 04/06/2014, 8:33 AM

## 2014-04-09 ENCOUNTER — Telehealth: Payer: Self-pay | Admitting: Internal Medicine

## 2014-04-09 NOTE — Telephone Encounter (Signed)
Pt has a referral to a vascular pcp, pt is scheduled to leave the country on 05/08/14 returning on 05/22/14. Pt states the earliest they could get him in to see the dr. Is 7/17. Pt is wanting to know if dr. Raliegh Ip can get him an earlier appt. Dr. Geryl Councilman is the dr. Abbott Pao would like to see if he can get dr. Trula Slade.

## 2014-04-09 NOTE — Telephone Encounter (Signed)
Brandon Dixon, can you help with this for pt.

## 2014-04-09 NOTE — Telephone Encounter (Signed)
Called dr Luanna Cole office left a msg with their triage nurse to see if patient appt can be moved up their office will contact pt to inform if this can be done and contact our office on  04-10-2014 as well

## 2014-04-09 NOTE — Telephone Encounter (Signed)
Pt called would like to know what the correct dosage he should be taking for predniSONE (DELTASONE) 2.5 MG tablet,  predniSONE (DELTASONE) 5 MG tablet

## 2014-04-09 NOTE — Telephone Encounter (Signed)
Dr. K, please see message and advise. 

## 2014-04-09 NOTE — Telephone Encounter (Signed)
Continue present dose of 10 mg daily (2   5 mg tablets)

## 2014-04-10 ENCOUNTER — Telehealth: Payer: Self-pay | Admitting: Vascular Surgery

## 2014-04-10 NOTE — Telephone Encounter (Signed)
Spoke to pt told him to continue present dose of 10 mg daily, take 2 5 mg tablets per Dr. Raliegh Ip. Pt verbalized understanding.

## 2014-04-10 NOTE — Telephone Encounter (Signed)
Pt states if he cannot get in to an earlier appt, he will consider going to another office for this. Pt prefers this office, but will schedule elsewhere if he has to. Advised pt we are waiting to hear from dr Luanna Cole office.

## 2014-04-10 NOTE — Telephone Encounter (Signed)
Called Dr Chaya Jan office again spoke with lisa she states she has to to take a msg for their triage nurse  To see if pt can be seen with Dr Glynda Jaeger and she will call our office back to inform so I am waiting to hear from this office - will call  Pt to inform of this .

## 2014-04-10 NOTE — Telephone Encounter (Signed)
Pt appt has been moved up to 04-18-2014@3 :30 dr Doren Custard pt aware  [facility] [address] [place], [place] [address] Tel [phone]

## 2014-04-10 NOTE — Telephone Encounter (Addendum)
Message copied by Gena Fray on Tue Apr 10, 2014  3:22 PM ------      Message from: Denman George      Created: Tue Apr 10, 2014 11:43 AM      Regarding: FW: NEEDS SOONER APP       Hinton Dyer- This is the pt. We spoke about this AM.  Were you able to find an earlier date?       ----- Message -----         From: Merleen Nicely         Sent: 04/10/2014   9:16 AM           To: Sherrye Payor, RN      Subject: NEEDS SOONER APP                                         Neoma Laming called for Brandon Dixon. She said he is scheduled with [place] on 7/17 but would like to get in sooner. He is having some problems. Please call Neoma Laming at [place]: [phone]            Thanks       Ebony Hail       ------  04/10/14: moved pts to get pt in on 07/01- Deborah aware, will let pt know, dpm

## 2014-04-17 ENCOUNTER — Encounter: Payer: Self-pay | Admitting: Vascular Surgery

## 2014-04-18 ENCOUNTER — Ambulatory Visit (INDEPENDENT_AMBULATORY_CARE_PROVIDER_SITE_OTHER): Payer: BC Managed Care – PPO | Admitting: Vascular Surgery

## 2014-04-18 ENCOUNTER — Encounter: Payer: Self-pay | Admitting: Vascular Surgery

## 2014-04-18 VITALS — BP 118/69 | HR 84 | Ht 71.5 in | Wt 171.0 lb

## 2014-04-18 DIAGNOSIS — I70219 Atherosclerosis of native arteries of extremities with intermittent claudication, unspecified extremity: Secondary | ICD-10-CM | POA: Insufficient documentation

## 2014-04-18 NOTE — Progress Notes (Signed)
Referred by:  [name], MD [place], [place] [address]  Reason for referral: left leg claudication  History of Present Illness  Brandon Dixon is a 68 y.o. (11-21-1945) male who presents with chief complaint: left leg pain.  Onset of symptom occurred "4-5" months ago.  Pain is described as soreness in the calf, severity 5/10, and associated with walking. He denies pain in the thighs or hips. The pain is better at rest. He has attempted to relieve the pain with a walking plan over the past two months with no relief. 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  He denies rest pain symptoms and history of non-healing leg wounds/ulcers,  Atherosclerotic risk factors include: hypercholesterolemia and previous smoking (quit in 2003).   He is currently on an aspirin and a statin. He is not diabetic and does not have hypertension. He has never had an MI nor CHF.   Past Medical History  Diagnosis Date  . ALLERGIC RHINITIS 09/26/2007  . BENIGN PROSTATIC HYPERTROPHY 05/27/2007  . COLONIC POLYPS, HX OF 05/27/2007  . DERMATITIS 08/25/2010  . GERD 05/27/2007  . HYPERCHOLESTEROLEMIA 09/26/2007    Past Surgical History  Procedure Laterality Date  . Vasectomy    . Rotator cuff repair     History   Social History  . Marital Status: Married    Spouse Name: N/A    Number of Children: N/A  . Years of Education: N/A   Occupational History  . Not on file.   Social History Main Topics  . Smoking status: Former Smoker    Quit date: 10/19/2001  . Smokeless tobacco: Never Used  . Alcohol Use: 3.6 oz/week    6 Cans of beer per week  . Drug Use: No  . Sexual Activity: Not on file   Other Topics Concern  . Not on file   Social History Narrative  . No narrative on file   Family History  Problem Relation Age of Onset  . Heart disease Mother   . Heart attack Mother   . Cancer Father     lung ca  . Hypertension  Sister   . Diabetes Brother   . Hypertension Sister   . Diabetes Brother   . Diabetes Brother   . Diabetes Brother   . Diabetes Brother   . Diabetes Daughter    Current Outpatient Prescriptions on File Prior to Visit  Medication Sig Dispense Refill  . aspirin 81 MG tablet Take 1 tablet (81 mg total) by mouth daily.  30 tablet    . betamethasone dipropionate (DIPROLENE) 0.05 % cream APPLY TWICE DAILY AS DIRECTED PRN  45 g  3  . fluticasone (FLONASE) 50 MCG/ACT nasal spray Place 2 sprays into both nostrils as needed.       Marland Kitchen ibuprofen (ADVIL,MOTRIN) 200 MG tablet Take 400 mg by mouth every 6 (six) hours as needed.      . Multiple Vitamin (MULTIVITAMIN) tablet Take 1 tablet by mouth daily.        . predniSONE (DELTASONE) 5 MG tablet 2 tablets every morning  90 tablet  0  . simvastatin (ZOCOR) 40 MG tablet One tablet daily  90 tablet  3  . tamsulosin (FLOMAX) 0.4 MG CAPS capsule TAKE 1 CAPSULE DAILY AFTER SUPPER *ACTAVIS MFR  90 capsule  1   No current facility-administered medications on file prior to visit.   No Known Allergies  REVIEW OF  SYSTEMS:  (Positives checked otherwise negative)  CARDIOVASCULAR:  [ ]  chest pain, [ ]  chest pressure, [ ]  palpitations, [ ]  shortness of breath when laying flat, [ ]  shortness of breath with exertion,   [ ]  pain in legs when walking, [ ]  pain in feet when laying flat, [ ]  history of blood clot in veins (DVT), [ ]  history of phlebitis, [ ]  swelling in legs, [ ]  varicose veins  PULMONARY:  [ ]  productive cough, [ ]  asthma, [ ]  wheezing  NEUROLOGIC:  [x ] weakness in arms or legs, [x ] numbness in arms or legs, [ ]  difficulty speaking or slurred speech, [ ]  temporary loss of vision in one eye, [ ]  dizziness  HEMATOLOGIC:  [ ]  bleeding problems, [ ]  problems with blood clotting too easily  MUSCULOSKEL:  [x ] joint pain, [ ]  joint swelling  GASTROINTEST:  [ ]   Vomiting blood, [ ]   Blood in stool     GENITOURINARY:  [ ]   Burning with urination, [ ]    Blood in urine  PSYCHIATRIC:  [ ]  history of major depression  INTEGUMENTARY:  [ ]  rashes, [ ]  ulcers  CONSTITUTIONAL:  [ ]  fever, [ ]  chills  For VQI Use Only   PRE-ADM LIVING: Home  AMB STATUS: Ambulatory  CAD Sx: None  PRIOR CHF: None  STRESS TEST: [x ] No, [ ]  Normal, [ ]  + ischemia, [ ]  + MI, [ ]  Both  Physical Examination Filed Vitals:   04/18/14 1601  BP: 118/69  Pulse: 84  Height: 5' 11.5" (1.816 m)  Weight: 171 lb (77.565 kg)  SpO2: 95%   Body mass index is 23.52 kg/(m^2).  General: A&O x 3, WDWN male in NAD   Head: Justice/AT  Ear/Nose/Throat: Hearing grossly intact, nares w/o erythema or drainage  Eyes: PERRLA, EOMI  Neck: Supple, no nuchal rigidity  Pulmonary: Sym exp, good air movt, CTAB, no rales, rhonchi, & wheezing  Cardiac: RRR, Nl S1, S2, no Murmurs, rubs or gallops, without bruits  Vascular: Vessel Right Left  Radial Palpable Palpable  Carotid Without bruit Without bruit  Aorta Not palpable N/A  Femoral Palpable Palpable  Popliteal Palpable Not palpable  PT Palpable Not palpable  DP Palpable Not palpable   Gastrointestinal: soft, NTND, -G/R, - HSM, - masses   Musculoskeletal: M/S 5/5 throughout. Extremities without ischemic changes  Neurologic: CN 2-12 intact. Pain and light touch intact in extremities   Psychiatric: Judgment intact, Mood & affect appropriatefor pt's clinical situation  Dermatologic: No ulcers or wounds,  no rashes otherwise noted  Lymph : No inguinal lymphadenopathy   Non-Invasive Vascular Imaging  ABI (Date: 04/05/14)  R: 1.0; PT 1.1; 1st toe: 177mmg  L: 0.61; PT 0.48; 1st toe: 74 mmg   Medical Decision Making  Brandon Dixon is a 68 y.o. male who presents with: left lower extremity intermittent claudication. He does not have rest pain nor a history of non-healing wounds of the lower extremity.    Recommended conservative treatment with a walking regimen. However, the patient feels that his symptoms  interfere with daily activity and would like to proceed more aggressively. The patient will follow up on 7/13/15for a left lower extremity arteriogram. Discussed possibility of balloon angioplasty intervention during procedure. The patient expressed interest in having a peripheral bypass in the future.   Discussed with the patient the natural history of intermittent claudication: 75% of patients have stable or improved symptoms in a year an only 2%  require amputation. Eventually 20% may require intervention in a year.  Discussed in depth with the patient the nature of atherosclerosis, and emphasized the importance of maximal medical management including strict control of blood pressure, blood glucose, and lipid levels, antiplatelet agent, obtaining regular exercise, and cessation of smoking.    The patient is aware that without maximal medical management the underlying atherosclerotic disease process will progress, limiting the benefit of any interventions.  Discussed in depth with the patient a walking plan and how to execute such.  The patient is not interested in starting Pletal.  The patient is currently on a statin: simvastatin 40 mg.    The patient is currently on an anti-platelet: aspirin.    Thank you for allowing [place] to participate in this patient's care.  Virgina Jock, PA-C [facility] [place] Office: [phone] Pager: [phone]  04/18/2014, 4:20 PM  This patient was seen in conjunction with Dr. Scot Dock.   Agree with above. He has disabling claudication the left lower extremity. I discussed conservative treatment including a structured walking program and the potential use of Pletal. However, he feels that his symptoms are significantly disabling and wishes to pursue arteriography. I have reviewed with the patient the indications for arteriography. In addition, I have reviewed the potential complications of arteriography including but not limited to:  Bleeding, arterial injury, arterial thrombosis, dye action, renal insufficiency, or other unpredictable medical problems. I have explained to the patient that if we find disease amenable to angioplasty we could potentially address this at the same time. I have discussed the potential complications of angioplasty and stenting, including but not limited to: Bleeding, arterial thrombosis, arterial injury, dissection, or the need for surgical intervention. His arteriogram has been scheduled for 04/30/2014. We will make further recommendations pending these results.  Deitra Mayo, MD, [place] [phone] 04/18/2014

## 2014-04-19 ENCOUNTER — Other Ambulatory Visit: Payer: Self-pay

## 2014-04-26 ENCOUNTER — Encounter (HOSPITAL_COMMUNITY): Payer: Self-pay | Admitting: Pharmacy Technician

## 2014-04-27 ENCOUNTER — Other Ambulatory Visit: Payer: Self-pay | Admitting: Internal Medicine

## 2014-04-27 ENCOUNTER — Telehealth: Payer: Self-pay | Admitting: Internal Medicine

## 2014-04-27 NOTE — Telephone Encounter (Signed)
Dr Raliegh Ip told pt to continue to take predniSONE (DELTASONE) 5 MG tablet 2 tabs/ daily, until he returns in sept.  pt has no more refills.   Need 90 day sent to [facility]/ [address]

## 2014-04-29 MED ORDER — SODIUM CHLORIDE 0.9 % IV SOLN
INTRAVENOUS | Status: DC
  Administered 2014-04-30: 09:00:00 via INTRAVENOUS

## 2014-04-30 ENCOUNTER — Other Ambulatory Visit: Payer: Self-pay

## 2014-04-30 ENCOUNTER — Ambulatory Visit (HOSPITAL_COMMUNITY)
Admission: RE | Admit: 2014-04-30 | Discharge: 2014-04-30 | Disposition: A | Discharge status: Home / Self Care | Payer: BC Managed Care – PPO | Source: Ambulatory Visit | Attending: Vascular Surgery | Admitting: Vascular Surgery

## 2014-04-30 ENCOUNTER — Encounter (HOSPITAL_COMMUNITY)
Admission: RE | Disposition: A | Discharge status: Home / Self Care | Payer: Self-pay | Source: Ambulatory Visit | Attending: Vascular Surgery

## 2014-04-30 DIAGNOSIS — Z01810 Encounter for preprocedural cardiovascular examination: Secondary | ICD-10-CM

## 2014-04-30 DIAGNOSIS — N400 Enlarged prostate without lower urinary tract symptoms: Secondary | ICD-10-CM | POA: Insufficient documentation

## 2014-04-30 DIAGNOSIS — I739 Peripheral vascular disease, unspecified: Secondary | ICD-10-CM

## 2014-04-30 DIAGNOSIS — E7800 Pure hypercholesterolemia, unspecified: Secondary | ICD-10-CM | POA: Insufficient documentation

## 2014-04-30 DIAGNOSIS — I70219 Atherosclerosis of native arteries of extremities with intermittent claudication, unspecified extremity: Secondary | ICD-10-CM | POA: Diagnosis not present

## 2014-04-30 DIAGNOSIS — Z87891 Personal history of nicotine dependence: Secondary | ICD-10-CM | POA: Insufficient documentation

## 2014-04-30 DIAGNOSIS — Z7982 Long term (current) use of aspirin: Secondary | ICD-10-CM | POA: Insufficient documentation

## 2014-04-30 DIAGNOSIS — K219 Gastro-esophageal reflux disease without esophagitis: Secondary | ICD-10-CM | POA: Insufficient documentation

## 2014-04-30 HISTORY — PX: LOWER EXTREMITY ANGIOGRAM: SHX5508

## 2014-04-30 LAB — POCT I-STAT, CHEM 8
BUN: 14 mg/dL (ref 6–23)
Calcium, Ion: 1.24 mmol/L (ref 1.13–1.30)
Chloride: 100 mEq/L (ref 96–112)
Creatinine, Ser: 0.9 mg/dL (ref 0.50–1.35)
Glucose, Bld: 111 mg/dL — ABNORMAL HIGH (ref 70–99)
HCT: 46 % (ref 39.0–52.0)
Hemoglobin: 15.6 g/dL (ref 13.0–17.0)
Potassium: 4 mEq/L (ref 3.7–5.3)
Sodium: 141 mEq/L (ref 137–147)
TCO2: 26 mmol/L (ref 0–100)

## 2014-04-30 SURGERY — ANGIOGRAM, LOWER EXTREMITY
Anesthesia: LOCAL | Laterality: Left

## 2014-04-30 MED ORDER — FENTANYL CITRATE 0.05 MG/ML IJ SOLN
INTRAMUSCULAR | Status: AC
  Filled 2014-04-30: qty 2

## 2014-04-30 MED ORDER — OXYCODONE-ACETAMINOPHEN 5-325 MG PO TABS: 1.0000 | ORAL_TABLET | ORAL | Status: DC | PRN

## 2014-04-30 MED ORDER — HEPARIN (PORCINE) IN NACL 2-0.9 UNIT/ML-% IJ SOLN
INTRAMUSCULAR | Status: AC
  Filled 2014-04-30: qty 1000

## 2014-04-30 MED ORDER — PREDNISONE 5 MG PO TABS: 10.0000 mg | ORAL_TABLET | Freq: Every day | ORAL | Status: DC

## 2014-04-30 MED ORDER — SODIUM CHLORIDE 0.9 % IV SOLN: 1.0000 mL/kg/h | INTRAVENOUS | Status: DC

## 2014-04-30 MED ORDER — LIDOCAINE HCL (PF) 1 % IJ SOLN
INTRAMUSCULAR | Status: AC
  Filled 2014-04-30: qty 30

## 2014-04-30 MED ORDER — MIDAZOLAM HCL 2 MG/2ML IJ SOLN
INTRAMUSCULAR | Status: AC
  Filled 2014-04-30: qty 2

## 2014-04-30 NOTE — Interval H&P Note (Signed)
History and Physical Interval Note:  04/30/2014 9:50 AM  Brandon Dixon  has presented today for surgery, with the diagnosis of PVD with claudication   The various methods of treatment have been discussed with the patient and family. After consideration of risks, benefits and other options for treatment, the patient has consented to  Procedure(s): LOWER EXTREMITY ANGIOGRAM (Left) as a surgical intervention .  The patient's history has been reviewed, patient examined, no change in status, stable for surgery.  I have reviewed the patient's chart and labs.  Questions were answered to the patient's satisfaction.     [name],[name]

## 2014-04-30 NOTE — Discharge Instructions (Signed)
Angiogram, Care After Refer to this sheet in the next few weeks. These instructions provide you with information on caring for yourself after your procedure. Your health care provider may also give you more specific instructions. Your treatment has been planned according to current medical practices, but problems sometimes occur. Call your health care provider if you have any problems or questions after your procedure.  WHAT TO EXPECT AFTER THE PROCEDURE After your procedure, it is typical to have the following sensations:  Minor discomfort or tenderness and a small bump at the catheter insertion site. The bump should usually decrease in size and tenderness within 1 to 2 weeks.  Any bruising will usually fade within 2 to 4 weeks. HOME CARE INSTRUCTIONS   You may need to keep taking blood thinners if they were prescribed for you. Only take over-the-counter or prescription medicines for pain, fever, or discomfort as directed by your health care provider.  Do not apply powder or lotion to the site.  Do not sit in a bathtub, swimming pool, or whirlpool for 5 to 7 days.  You may shower 24 hours after the procedure. Remove the bandage (dressing) and gently wash the site with plain soap and water. Gently pat the site dry.  Inspect the site at least twice daily.  Limit your activity for the first 48 hours. Do not bend, squat, or lift anything over 20 lb (9 kg) or as directed by your health care provider.  Do not drive home if you are discharged the day of the procedure. Have someone else drive you. Follow instructions about when you can drive or return to work. SEEK MEDICAL CARE IF:  You get lightheaded when standing up.  You have drainage (other than a small amount of blood on the dressing).  You have chills.  You have a fever.  You have redness, warmth, swelling, or pain at the insertion site. SEEK IMMEDIATE MEDICAL CARE IF:   You develop chest pain or shortness of breath, feel faint,  or pass out.  You have bleeding, swelling larger than a walnut, or drainage from the catheter insertion site.  You develop pain, discoloration, coldness, or severe bruising in the leg or arm that held the catheter.  You develop bleeding from any other place, such as the bowels. You may see bright red blood in your urine or stools, or your stools may appear black and tarry.  You have heavy bleeding from the site. If this happens, hold pressure on the site. MAKE SURE YOU:  Understand these instructions.  Will watch your condition.  Will get help right away if you are not doing well or get worse. Document Released: 04/23/2005 Document Revised: 10/10/2013 Document Reviewed: 02/27/2013 [facility] Patient Information 2015 [place], [place]. This information is not intended to replace advice given to you by your health care provider. Make sure you discuss any questions you have with your health care provider.

## 2014-04-30 NOTE — Telephone Encounter (Signed)
Pt notified Rx sent to pharmacy

## 2014-04-30 NOTE — Progress Notes (Signed)
TRANSFERRED FROM HOLDING AREA IN N OBVIOUS DISTRESS. CATH SITE INTACT, NO BLEEDING, NO HEMATOMA. ALERT ORIENTED. SKIN INTACT WARM AND DRY, NO INJURY OR TEARS. IV INFUSING WITHOUT DIFFICULTY. POST CATH INSTRUCTIONS GIVEN: REMAIN FLAT IN BED UNTIL INSTRUCTED OTHERWISE, KEEP RLE  IMMOBILE. ALERT NURSE IF NEED TO VOID.

## 2014-04-30 NOTE — Op Note (Signed)
   PATIENT: Brandon Dixon   MRN: [id number] DOB: [birth year]    DATE OF PROCEDURE: 04/30/2014  INDICATIONS: Brandon Dixon is a 68 y.o. male who presents with disabling claudication of his left lower extremity. He presents for arteriography and possible intervention.  PROCEDURE:  1. Ultrasound-guided access to the right common femoral artery 2. Aortogram with bilateral iliac arteriogram 3. Selective catheterization of the left external iliac artery with left lower extremity runoff 4. Right femoral arteriogram with right lower extremity runoff  SURGEON: Judeth Cornfield. Scot Dock, MD, FACS  ANESTHESIA: local with sedation   EBL: minimal  TECHNIQUE: The patient was taken to the peripheral vascular lab and received 1 mg of Versed and 50 mcg of fentanyl. Both groins were prepped and draped in usual sterile fashion. After the skin was infiltrated with 1% lidocaine under ultrasound guidance, the right common femoral artery was cannulated however initially the wire would not thread. Pressure was held for hemostasis for 5 minutes. I then cannulated the artery with the micropuncture needle and a micropuncture wire was introduced without difficulty. The micropuncture sheath was introduced over the wire. This was exchanged for a 5 [place] sheath over a [employer] wire. The pigtail catheter was positioned at the L1 vertebral body and flush aortogram obtained. The cath was positioned above the aortic bifurcation and oblique iliac projection was obtained. Bilateral lower extremity runoff films were obtained. There was poor visualization distally therefore elected to cross over to the left side for better pictures. The pigtail catheter was exchanged for a crossover catheter which was positioned into the left common iliac artery. An angled Glidewire was advanced to the common femoral artery and then the crossover catheter exchanged for an endhole catheter. This was positioned in the common femoral artery. Selective spot films  were obtained the left leg. The endhole catheter was then removed and an a right femoral arteriogram was obtained with spot films of the right leg. At the completion the sheath was flushed and the patient was transferred to short stay for removal of the sheath. No immediate complications were noted.  FINDINGS:  1. There is a small superior accessory renal on the left. The main renal arteries are patent with no significant renal artery stenosis identified. 2. The infrarenal aorta, bilateral common iliac arteries, bilateral external iliac arteries, and bilateral hypogastric arteries are patent. 3. On the left side, which is the symptomatic side, there is moderate diffuse disease of the proximal superficial femoral artery which is occluded in the proximal thigh. The common femoral and deep femoral arteries are patent. There is reconstitution of the above-knee pop artery on the left although there is some disease at the popliteal artery behind the knee. The distal popliteal artery is occluded. There is reconstitution of the proximal anterior tibial artery and the left which is the dominant runoff. There is severe diffuse disease of the tibial peroneal trunk, posterior tibial artery, and peroneal arteries. 4. On the right side, the common femoral and deep femoral arteries are patent. There is mild disease of the superficial femoral artery and distal popliteal artery. There is single vessel runoff on the right via the anterior tibial artery. The posterior tibial and peroneal arteries appear to be occluded.  Deitra Mayo, MD, FACS [facility]  DATE OF DICTATION:   04/30/2014

## 2014-04-30 NOTE — Progress Notes (Signed)
RESTING ON STRETCHER, HOB FLAT. DENIES DISCOMFORT . PRESSURE BEING HELD WITH NO COMPLICATION, NO HEMATOMA, NO HEMORRHAGE. DENIES NEED TO VOID. PALPABLE PULSE RLE.

## 2014-04-30 NOTE — Progress Notes (Signed)
PRESSURE REMOVED FROM SITE R GROIN, GAUZE WITH OPSITE PLACED NO COMPLICATION. NO EVIDENCE OF HEMATOMA, NO HEMORRHAGE.

## 2014-04-30 NOTE — H&P (View-Only) (Signed)
Referred by:  [name], MD [place], [place] [id number]  Reason for referral: left leg claudication  History of Present Illness  Brandon Dixon is a 68 y.o. (1946-07-22) male who presents with chief complaint: left leg pain.  Onset of symptom occurred "4-5" months ago.  Pain is described as soreness in the calf, severity 5/10, and associated with walking. He denies pain in the thighs or hips. The pain is better at rest. He has attempted to relieve the pain with a walking plan over the past two months with no relief. 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  He denies rest pain symptoms and history of non-healing leg wounds/ulcers,  Atherosclerotic risk factors include: hypercholesterolemia and previous smoking (quit in 2003).   He is currently on an aspirin and a statin. He is not diabetic and does not have hypertension. He has never had an MI nor CHF.   Past Medical History  Diagnosis Date  . ALLERGIC RHINITIS 09/26/2007  . BENIGN PROSTATIC HYPERTROPHY 05/27/2007  . COLONIC POLYPS, HX OF 05/27/2007  . DERMATITIS 08/25/2010  . GERD 05/27/2007  . HYPERCHOLESTEROLEMIA 09/26/2007    Past Surgical History  Procedure Laterality Date  . Vasectomy    . Rotator cuff repair     History   Social History  . Marital Status: Married    Spouse Name: N/A    Number of Children: N/A  . Years of Education: N/A   Occupational History  . Not on file.   Social History Main Topics  . Smoking status: Former Smoker    Quit date: 10/19/2001  . Smokeless tobacco: Never Used  . Alcohol Use: 3.6 oz/week    6 Cans of beer per week  . Drug Use: No  . Sexual Activity: Not on file   Other Topics Concern  . Not on file   Social History Narrative  . No narrative on file   Family History  Problem Relation Age of Onset  . Heart disease Mother   . Heart attack Mother   . Cancer Father     lung ca  . Hypertension  Sister   . Diabetes Brother   . Hypertension Sister   . Diabetes Brother   . Diabetes Brother   . Diabetes Brother   . Diabetes Brother   . Diabetes Daughter    Current Outpatient Prescriptions on File Prior to Visit  Medication Sig Dispense Refill  . aspirin 81 MG tablet Take 1 tablet (81 mg total) by mouth daily.  30 tablet    . betamethasone dipropionate (DIPROLENE) 0.05 % cream APPLY TWICE DAILY AS DIRECTED PRN  45 g  3  . fluticasone (FLONASE) 50 MCG/ACT nasal spray Place 2 sprays into both nostrils as needed.       Marland Kitchen ibuprofen (ADVIL,MOTRIN) 200 MG tablet Take 400 mg by mouth every 6 (six) hours as needed.      . Multiple Vitamin (MULTIVITAMIN) tablet Take 1 tablet by mouth daily.        . predniSONE (DELTASONE) 5 MG tablet 2 tablets every morning  90 tablet  0  . simvastatin (ZOCOR) 40 MG tablet One tablet daily  90 tablet  3  . tamsulosin (FLOMAX) 0.4 MG CAPS capsule TAKE 1 CAPSULE DAILY AFTER SUPPER *ACTAVIS MFR  90 capsule  1   No current facility-administered medications on file prior to visit.   No Known Allergies  REVIEW OF  SYSTEMS:  (Positives checked otherwise negative)  CARDIOVASCULAR:  [ ]  chest pain, [ ]  chest pressure, [ ]  palpitations, [ ]  shortness of breath when laying flat, [ ]  shortness of breath with exertion,   [ ]  pain in legs when walking, [ ]  pain in feet when laying flat, [ ]  history of blood clot in veins (DVT), [ ]  history of phlebitis, [ ]  swelling in legs, [ ]  varicose veins  PULMONARY:  [ ]  productive cough, [ ]  asthma, [ ]  wheezing  NEUROLOGIC:  [x ] weakness in arms or legs, [x ] numbness in arms or legs, [ ]  difficulty speaking or slurred speech, [ ]  temporary loss of vision in one eye, [ ]  dizziness  HEMATOLOGIC:  [ ]  bleeding problems, [ ]  problems with blood clotting too easily  MUSCULOSKEL:  [x ] joint pain, [ ]  joint swelling  GASTROINTEST:  [ ]   Vomiting blood, [ ]   Blood in stool     GENITOURINARY:  [ ]   Burning with urination, [ ]    Blood in urine  PSYCHIATRIC:  [ ]  history of major depression  INTEGUMENTARY:  [ ]  rashes, [ ]  ulcers  CONSTITUTIONAL:  [ ]  fever, [ ]  chills  For VQI Use Only   PRE-ADM LIVING: Home  AMB STATUS: Ambulatory  CAD Sx: None  PRIOR CHF: None  STRESS TEST: [x ] No, [ ]  Normal, [ ]  + ischemia, [ ]  + MI, [ ]  Both  Physical Examination Filed Vitals:   04/18/14 1601  BP: 118/69  Pulse: 84  Height: 5' 11.5" (1.816 m)  Weight: 171 lb (77.565 kg)  SpO2: 95%   Body mass index is 23.52 kg/(m^2).  General: A&O x 3, WDWN male in NAD   Head: [place]/AT  Ear/Nose/Throat: Hearing grossly intact, nares w/o erythema or drainage  Eyes: PERRLA, EOMI  Neck: Supple, no nuchal rigidity  Pulmonary: Sym exp, good air movt, CTAB, no rales, rhonchi, & wheezing  Cardiac: RRR, Nl S1, S2, no Murmurs, rubs or gallops, without bruits  Vascular: Vessel Right Left  Radial Palpable Palpable  Carotid Without bruit Without bruit  Aorta Not palpable N/A  Femoral Palpable Palpable  Popliteal Palpable Not palpable  PT Palpable Not palpable  DP Palpable Not palpable   Gastrointestinal: soft, NTND, -G/R, - HSM, - masses   Musculoskeletal: M/S 5/5 throughout. Extremities without ischemic changes  Neurologic: CN 2-12 intact. Pain and light touch intact in extremities   Psychiatric: Judgment intact, Mood & affect appropriatefor pt's clinical situation  Dermatologic: No ulcers or wounds,  no rashes otherwise noted  Lymph : No inguinal lymphadenopathy   Non-Invasive Vascular Imaging  ABI (Date: 04/05/14)  R: 1.0; PT 1.1; 1st toe: 159mmg  L: 0.61; PT 0.48; 1st toe: 74 mmg   Medical Decision Making  Brandon Dixon is a 68 y.o. male who presents with: left lower extremity intermittent claudication. He does not have rest pain nor a history of non-healing wounds of the lower extremity.    Recommended conservative treatment with a walking regimen. However, the patient feels that his symptoms  interfere with daily activity and would like to proceed more aggressively. The patient will follow up on 7/13/15for a left lower extremity arteriogram. Discussed possibility of balloon angioplasty intervention during procedure. The patient expressed interest in having a peripheral bypass in the future.   Discussed with the patient the natural history of intermittent claudication: 75% of patients have stable or improved symptoms in a year an only 2%  require amputation. Eventually 20% may require intervention in a year.  Discussed in depth with the patient the nature of atherosclerosis, and emphasized the importance of maximal medical management including strict control of blood pressure, blood glucose, and lipid levels, antiplatelet agent, obtaining regular exercise, and cessation of smoking.    The patient is aware that without maximal medical management the underlying atherosclerotic disease process will progress, limiting the benefit of any interventions.  Discussed in depth with the patient a walking plan and how to execute such.  The patient is not interested in starting Pletal.  The patient is currently on a statin: simvastatin 40 mg.    The patient is currently on an anti-platelet: aspirin.    Thank you for allowing [place] to participate in this patient's care.  Virgina Jock, PA-C [facility] Office: [phone] Pager: [phone]  04/18/2014, 4:20 PM  This patient was seen in conjunction with Dr. Scot Dock.   Agree with above. He has disabling claudication the left lower extremity. I discussed conservative treatment including a structured walking program and the potential use of Pletal. However, he feels that his symptoms are significantly disabling and wishes to pursue arteriography. I have reviewed with the patient the indications for arteriography. In addition, I have reviewed the potential complications of arteriography including but not limited to:  Bleeding, arterial injury, arterial thrombosis, dye action, renal insufficiency, or other unpredictable medical problems. I have explained to the patient that if we find disease amenable to angioplasty we could potentially address this at the same time. I have discussed the potential complications of angioplasty and stenting, including but not limited to: Bleeding, arterial thrombosis, arterial injury, dissection, or the need for surgical intervention. His arteriogram has been scheduled for 04/30/2014. We will make further recommendations pending these results.  Deitra Mayo, MD, [place] [phone] 04/18/2014

## 2014-05-01 ENCOUNTER — Telehealth: Payer: Self-pay | Admitting: Vascular Surgery

## 2014-05-01 NOTE — Telephone Encounter (Signed)
Message copied by Gena Fray on Tue May 01, 2014  2:56 PM ------      Message from: Denman George      Created: Mon Apr 30, 2014  1:09 PM      Regarding: Steph log; and needs Left LE vein map with [facility] office visit 3-4 wks                   ----- Message -----         From: Angelia Mould, MD         Sent: 04/30/2014  11:28 AM           To: [facility] Charge Pool      Subject: charge and f/u                                           PROCEDURE:       1. Ultrasound-guided access to the right common femoral artery      2. Aortogram with bilateral iliac arteriogram      3. Selective catheterization of the left external iliac artery with left lower extremity runoff      4. Right femoral arteriogram with right lower extremity runoff            SURGEON: Judeth Cornfield. Scot Dock, MD, FACS            He will need a follow up visit in 3-4 weeks to discuss left femoropopliteal bypass grafting. He needs a map of his left greater saphenous vein at that time. Thank you.            CD ------

## 2014-05-01 NOTE — Telephone Encounter (Signed)
Lm for pt re appt, dpm  °

## 2014-05-03 ENCOUNTER — Telehealth: Payer: Self-pay

## 2014-05-03 NOTE — Telephone Encounter (Signed)
Phone call from pt. with report of swelling and bruising of right groin.  Stated there is a moderate amt. of swelling; describes area as "soft".  Denies redness or warmth.  Stated there is some tenderness, and some bruising @ the site.  Pt. Verb. That he will be traveling out of the country in 5 days, and wanted to make sure everything is okay.  Advised that due to the amt. Of firm pressure applied to the groin, following the angiogram, bruising and tenderness are not uncommon.  Advised to note the area of swelling, and to report if swelling increases,if area becomes firm, and if area becomes more painful.  Also advised to watch for s/s of infection; ie: redness, warmth, drainage, fever/ chills.  Questioned about restrictions in activity.  Advised to avoid any strenuous activity that could cause stress/strain to the right groin for 72 hrs. post procedure.  Pt. verb. understanding.

## 2014-05-04 ENCOUNTER — Encounter (HOSPITAL_COMMUNITY): Payer: Medicare Other

## 2014-05-04 ENCOUNTER — Encounter: Payer: Medicare Other | Admitting: Vascular Surgery

## 2014-05-22 ENCOUNTER — Encounter: Payer: Self-pay | Admitting: Vascular Surgery

## 2014-05-23 ENCOUNTER — Encounter: Payer: Medicare Other | Admitting: Vascular Surgery

## 2014-05-23 ENCOUNTER — Ambulatory Visit (INDEPENDENT_AMBULATORY_CARE_PROVIDER_SITE_OTHER): Payer: BC Managed Care – PPO | Admitting: Vascular Surgery

## 2014-05-23 ENCOUNTER — Encounter: Payer: Self-pay | Admitting: Vascular Surgery

## 2014-05-23 ENCOUNTER — Encounter (HOSPITAL_COMMUNITY): Payer: Medicare Other

## 2014-05-23 ENCOUNTER — Ambulatory Visit (HOSPITAL_COMMUNITY)
Admission: RE | Admit: 2014-05-23 | Discharge: 2014-05-23 | Disposition: A | Discharge status: Home / Self Care | Payer: BC Managed Care – PPO | Source: Ambulatory Visit | Attending: Vascular Surgery | Admitting: Vascular Surgery

## 2014-05-23 VITALS — BP 130/81 | HR 83 | Resp 18 | Ht 72.0 in | Wt 172.0 lb

## 2014-05-23 DIAGNOSIS — I70219 Atherosclerosis of native arteries of extremities with intermittent claudication, unspecified extremity: Secondary | ICD-10-CM

## 2014-05-23 DIAGNOSIS — I739 Peripheral vascular disease, unspecified: Secondary | ICD-10-CM | POA: Diagnosis not present

## 2014-05-23 DIAGNOSIS — Z01810 Encounter for preprocedural cardiovascular examination: Secondary | ICD-10-CM | POA: Insufficient documentation

## 2014-05-23 NOTE — Assessment & Plan Note (Signed)
After reviewing his arteriogram and vein mapping, I think that the best option  If we were to pursue revascularization would be a femoral to distal above knee popliteal artery bypass with a vein graft. 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 Since he is only claudication I think bypassing to the above-knee popliteal artery would improve his circulation enough, despite his tibial artery occlusive disease, to relieve his claudication. The patient feels however that his symptoms have improved since his last visit and therefore we would like to continue with conservative treatment. He will continue to walk daily. He is not a smoker. If his symptoms progress then certainly we could proceed with bypass. I plan on seeing him back in 6 months. I have ordered follow up ABIs for that time. He knows to call sooner if he has problems.

## 2014-05-23 NOTE — Progress Notes (Signed)
Patient ID: Brandon Dixon, male   DOB: June 09, 1946, 68 y.o.   MRN: [id number]  Reason for Visit: PVD   Referred by Brandon Lor, MD  Subjective:     HPI:  Brandon Dixon is a 68 y.o. male who was seen in our office with left lower extremity claudication. I have discussed conservative treatment however he felt that his symptoms were significantly disabling wish to pursue arteriography. He underwent an arteriogram on 04/30/2014. The results are discussed below.  Since his arteriogram he has been to [place] and actually states that his symptoms have improved somewhat. He experiences left calf claudication which is brought on by ambulation and relieved with rest. He has been walking up to a mile a day. He denies any history of rest pain or nonhealing ulcers. He is not a smoker.  Past Medical History  Diagnosis Date  . ALLERGIC RHINITIS 09/26/2007  . BENIGN PROSTATIC HYPERTROPHY 05/27/2007  . COLONIC POLYPS, HX OF 05/27/2007  . DERMATITIS 08/25/2010  . GERD 05/27/2007  . HYPERCHOLESTEROLEMIA 09/26/2007  . Peripheral vascular disease    Family History  Problem Relation Age of Onset  . Heart disease Mother   . Heart attack Mother   . Cancer Father     lung ca  . Hypertension Sister   . Diabetes Brother   . Hypertension Sister   . Diabetes Brother   . Diabetes Brother   . Diabetes Brother   . Diabetes Brother   . Diabetes Daughter    Past Surgical History  Procedure Laterality Date  . Vasectomy    . Rotator cuff repair     Short Social History:  History  Substance Use Topics  . Smoking status: Former Smoker    Quit date: 10/19/2001  . Smokeless tobacco: Never Used  . Alcohol Use: 3.6 oz/week    6 Cans of beer per week   No Known Allergies  Current Outpatient Prescriptions  Medication Sig Dispense Refill  . aspirin EC 81 MG tablet Take 81 mg by mouth daily.      . betamethasone dipropionate (DIPROLENE) 0.05 % cream Apply 1 application topically 2 (two) times daily as  needed (for itching).      . fluticasone (FLONASE) 50 MCG/ACT nasal spray Place 2 sprays into both nostrils daily as needed for allergies or rhinitis.       . Multiple Vitamin (MULTIVITAMIN) tablet Take 1 tablet by mouth daily.        . predniSONE (DELTASONE) 5 MG tablet Take 2 tablets (10 mg total) by mouth daily with breakfast.  180 tablet  0  . simvastatin (ZOCOR) 40 MG tablet Take 40 mg by mouth daily.      . tamsulosin (FLOMAX) 0.4 MG CAPS capsule Take 0.4 mg by mouth daily.       No current facility-administered medications for this visit.   Review of Systems  Constitutional: Negative for chills and fever.  Eyes: Negative for loss of vision.  Respiratory: Negative for cough and wheezing.  Cardiovascular: Negative for chest pain, chest tightness, claudication, dyspnea with exertion, orthopnea and palpitations.  GI: Negative for blood in stool and vomiting.  GU: Negative for dysuria and hematuria.  Musculoskeletal: Negative for leg pain, joint pain and myalgias.  Skin: Negative for rash and wound.  Neurological: Negative for dizziness and speech difficulty.  Hematologic: Negative for bruises/bleeds easily. Psychiatric: Negative for depressed mood.       Objective:  Objective  Filed Vitals:   05/23/14 1411  BP: 130/81  Pulse: 83  Resp: 18  Height: 6' (1.829 m)  Weight: 172 lb (78.019 kg)   Body mass index is 23.32 kg/(m^2).  Physical Exam  Constitutional: He is oriented to person, place, and time. He appears well-developed and well-nourished.  HENT:  Head: Normocephalic and atraumatic.  Neck: Neck supple. No JVD present. No thyromegaly present.  Cardiovascular: Normal rate, regular rhythm and normal heart sounds.  Exam reveals no friction rub.   No murmur heard. Pulses:      Femoral pulses are 2+ on the right side, and 2+ on the left side. Pulmonary/Chest: Breath sounds normal. He has no wheezes. He has no rales.  Abdominal: Soft. Bowel sounds are normal. There is no  tenderness.  I do not palpate an aneurysm.  Musculoskeletal: Normal range of motion. He exhibits no edema.  Lymphadenopathy:    He has no cervical adenopathy.  Neurological: He is alert and oriented to person, place, and time. He has normal strength. No sensory deficit.  Skin: No lesion and no rash noted.  Psychiatric: He has a normal mood and affect.    Data: ARTERIOGRAM: His arteriogram shows no significant inflow disease on the left. He has a left superficial femoral artery occlusion with reconstitution of the above-knee popliteal artery.  It appears that the best distal target would be the above-knee popliteal artery just above the level of the knee. This would be a difficult area to get at but appears to be the best target. Below that he has occlusion of the below knee popliteal artery and it looks like he reconstitutes his anterior tibial artery based on digital subtraction films and also the lateral view of his foot.  I have independently interpreted his vein mapping. The greater saphenous vein on the left has diameters ranging from 0.62-0.23 cm. He also was noted to have a branch in the mid greater saphenous vein area the vein on the right is not in a larger.     Assessment/Plan:    Atherosclerosis of native arteries of the extremities with intermittent claudication After reviewing his arteriogram and vein mapping, I think that the best option  If we were to pursue revascularization would be a femoral to distal above knee popliteal artery bypass with a vein graft. 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 Since he is only claudication I think bypassing to the above-knee popliteal artery would improve his circulation enough, despite his tibial artery occlusive disease, to relieve his claudication. The patient feels however that his symptoms have improved since his last visit and  therefore we would like to continue with conservative treatment. He will continue to walk daily. He is not a smoker. If his symptoms progress then certainly we could proceed with bypass. I plan on seeing him back in 6 months. I have ordered follow up ABIs for that time. He knows to call sooner if he has problems.   Angelia Mould MD [facility]

## 2014-05-23 NOTE — Addendum Note (Signed)
Addended by: Mena Goes on: 05/23/2014 04:22 PM   Modules accepted: Orders

## 2014-06-26 ENCOUNTER — Ambulatory Visit (INDEPENDENT_AMBULATORY_CARE_PROVIDER_SITE_OTHER): Payer: BC Managed Care – PPO | Admitting: Internal Medicine

## 2014-06-26 ENCOUNTER — Other Ambulatory Visit: Payer: Self-pay | Admitting: Internal Medicine

## 2014-06-26 ENCOUNTER — Encounter: Payer: Self-pay | Admitting: Internal Medicine

## 2014-06-26 VITALS — BP 124/80 | HR 80 | Temp 98.0°F | Resp 18 | Ht 72.0 in | Wt 175.0 lb

## 2014-06-26 DIAGNOSIS — M353 Polymyalgia rheumatica: Secondary | ICD-10-CM | POA: Diagnosis not present

## 2014-06-26 DIAGNOSIS — I70219 Atherosclerosis of native arteries of extremities with intermittent claudication, unspecified extremity: Secondary | ICD-10-CM

## 2014-06-26 DIAGNOSIS — E7800 Pure hypercholesterolemia, unspecified: Secondary | ICD-10-CM | POA: Diagnosis not present

## 2014-06-26 DIAGNOSIS — I739 Peripheral vascular disease, unspecified: Secondary | ICD-10-CM

## 2014-06-26 LAB — SEDIMENTATION RATE: Sed Rate: 34 mm/hr — ABNORMAL HIGH (ref 0–22)

## 2014-06-26 NOTE — Progress Notes (Signed)
Pre visit review using our clinic review tool, if applicable. No additional management support is needed unless otherwise documented below in the visit note. 

## 2014-06-26 NOTE — Progress Notes (Signed)
Subjective:    Patient ID: Brandon Dixon, male    DOB: 1946-01-20, 68 y.o.   MRN: [id number]  HPI  68 year old patient who is seen today in followup.  He has a history of suspected polymyalgia rheumatica.  For the past week.  He has had increasing pain and stiffness.  He is on prednisone 10 mg every morning. He has stable left leg claudication His concern about statin therapy and his recent dose titration as a cause of his symptoms.  He has been on statin therapy for years and his recent symptoms are identical to his prior symptoms that prompted the diagnosis of PMR.  These symptoms have been very steroid responsive  Past Medical History  Diagnosis Date  . ALLERGIC RHINITIS 09/26/2007  . BENIGN PROSTATIC HYPERTROPHY 05/27/2007  . COLONIC POLYPS, HX OF 05/27/2007  . DERMATITIS 08/25/2010  . GERD 05/27/2007  . HYPERCHOLESTEROLEMIA 09/26/2007  . Peripheral vascular disease     History   Social History  . Marital Status: Married    Spouse Name: N/A    Number of Children: N/A  . Years of Education: N/A   Occupational History  . Not on file.   Social History Main Topics  . Smoking status: Former Smoker    Quit date: 10/19/2001  . Smokeless tobacco: Never Used  . Alcohol Use: 3.6 oz/week    6 Cans of beer per week  . Drug Use: No  . Sexual Activity: Not on file   Other Topics Concern  . Not on file   Social History Narrative  . No narrative on file    Past Surgical History  Procedure Laterality Date  . Vasectomy    . Rotator cuff repair      Family History  Problem Relation Age of Onset  . Heart disease Mother   . Heart attack Mother   . Cancer Father     lung ca  . Hypertension Sister   . Diabetes Brother   . Hypertension Sister   . Diabetes Brother   . Diabetes Brother   . Diabetes Brother   . Diabetes Brother   . Diabetes Daughter     No Known Allergies  Current Outpatient Prescriptions on File Prior to Visit  Medication Sig Dispense Refill  . aspirin  EC 81 MG tablet Take 81 mg by mouth daily.      . betamethasone dipropionate (DIPROLENE) 0.05 % cream Apply 1 application topically 2 (two) times daily as needed (for itching).      . fluticasone (FLONASE) 50 MCG/ACT nasal spray Place 2 sprays into both nostrils daily as needed for allergies or rhinitis.       . Multiple Vitamin (MULTIVITAMIN) tablet Take 1 tablet by mouth daily.        . predniSONE (DELTASONE) 5 MG tablet Take 2 tablets (10 mg total) by mouth daily with breakfast.  180 tablet  0  . simvastatin (ZOCOR) 40 MG tablet Take 40 mg by mouth daily.      . tamsulosin (FLOMAX) 0.4 MG CAPS capsule Take 0.4 mg by mouth daily.       No current facility-administered medications on file prior to visit.    BP 124/80  Pulse 80  Temp(Src) 98 F (36.7 C) (Oral)  Resp 18  Ht 6' (1.829 m)  Wt 175 lb (79.379 kg)  BMI 23.73 kg/m2  SpO2 95%      Review of Systems  Constitutional: Positive for fatigue.  Musculoskeletal: Positive for arthralgias.  Neurological: Positive for weakness.       Objective:   Physical Exam  Constitutional: He appears well-developed and well-nourished.  Blood pressure well controlled  Musculoskeletal: He exhibits no edema.          Assessment & Plan:   Stiffness/weakness.  Suspect PMR.  We'll check a sedimentation rate.  We'll increase prednisone to 10 mg twice a day for a few days and is stable on this dose will challenge on 15 mg daily.  Recheck 1 month Left leg claudication.  Vascular surgery noted, reviewed and discussed with patient.  Presently, he continues to do well and his claudication is tolerable Dyslipidemia.  Will continue statin therapy at the present dose

## 2014-06-26 NOTE — Patient Instructions (Signed)
Take a calcium supplement, plus [phone] units of vitamin D    It is important that you exercise regularly, at least 20 minutes 3 to 4 times per week.  If you develop chest pain or shortness of breath seek  medical attention.  Increase prednisone to 10 mg twice daily for 3 days, Then start 10  milligrams in the morning and 5 mg in the afternoon

## 2014-07-18 ENCOUNTER — Other Ambulatory Visit: Payer: Self-pay | Admitting: Internal Medicine

## 2014-07-19 DIAGNOSIS — Z23 Encounter for immunization: Secondary | ICD-10-CM | POA: Diagnosis not present

## 2014-07-24 ENCOUNTER — Ambulatory Visit (INDEPENDENT_AMBULATORY_CARE_PROVIDER_SITE_OTHER): Payer: BC Managed Care – PPO | Admitting: Internal Medicine

## 2014-07-24 ENCOUNTER — Encounter: Payer: Self-pay | Admitting: Internal Medicine

## 2014-07-24 VITALS — BP 130/90 | HR 84 | Temp 98.0°F | Resp 20 | Ht 72.0 in | Wt 177.0 lb

## 2014-07-24 DIAGNOSIS — M353 Polymyalgia rheumatica: Secondary | ICD-10-CM

## 2014-07-24 DIAGNOSIS — I70219 Atherosclerosis of native arteries of extremities with intermittent claudication, unspecified extremity: Secondary | ICD-10-CM

## 2014-07-24 DIAGNOSIS — I739 Peripheral vascular disease, unspecified: Secondary | ICD-10-CM | POA: Diagnosis not present

## 2014-07-24 LAB — SEDIMENTATION RATE: Sed Rate: 21 mm/hr (ref 0–22)

## 2014-07-24 NOTE — Progress Notes (Signed)
Subjective:    Patient ID: Brandon Dixon, male    DOB: Aug 16, 1946, 68 y.o.   MRN: [id number]  HPI   68 year old patient who is in today for followup of PMR.  He was seen one month ago and did poorly on a dose reduction of prednisone to 10 mg daily.  At the present time, he is on  15 mg and feels great.  His exercising regularly and really is symptom-free. His left leg claudication.  Also has been stable and essentially pain-free.  He has had some insomnia issues on the higher prednisone dose  Past Medical History  Diagnosis Date  . ALLERGIC RHINITIS 09/26/2007  . BENIGN PROSTATIC HYPERTROPHY 05/27/2007  . COLONIC POLYPS, HX OF 05/27/2007  . DERMATITIS 08/25/2010  . GERD 05/27/2007  . HYPERCHOLESTEROLEMIA 09/26/2007  . Peripheral vascular disease     History   Social History  . Marital Status: Married    Spouse Name: N/A    Number of Children: N/A  . Years of Education: N/A   Occupational History  . Not on file.   Social History Main Topics  . Smoking status: Former Smoker    Quit date: 10/19/2001  . Smokeless tobacco: Never Used  . Alcohol Use: 3.6 oz/week    6 Cans of beer per week  . Drug Use: No  . Sexual Activity: Not on file   Other Topics Concern  . Not on file   Social History Narrative  . No narrative on file    Past Surgical History  Procedure Laterality Date  . Vasectomy    . Rotator cuff repair      Family History  Problem Relation Age of Onset  . Heart disease Mother   . Heart attack Mother   . Cancer Father     lung ca  . Hypertension Sister   . Diabetes Brother   . Hypertension Sister   . Diabetes Brother   . Diabetes Brother   . Diabetes Brother   . Diabetes Brother   . Diabetes Daughter     No Known Allergies  Current Outpatient Prescriptions on File Prior to Visit  Medication Sig Dispense Refill  . aspirin EC 81 MG tablet Take 81 mg by mouth daily.      . betamethasone dipropionate (DIPROLENE) 0.05 % cream Apply 1 application  topically 2 (two) times daily as needed (for itching).      . fluticasone (FLONASE) 50 MCG/ACT nasal spray Place 2 sprays into both nostrils daily as needed for allergies or rhinitis.       . predniSONE (DELTASONE) 5 MG tablet TAKE 2 TABLETS BY MOUTH DAILY WITH BREAKFAST.  180 tablet  0  . simvastatin (ZOCOR) 40 MG tablet Take 40 mg by mouth daily.      . tamsulosin (FLOMAX) 0.4 MG CAPS capsule TAKE 1 CAPSULE DAILY AFTER SUPPER *WATSON/ACTAVIS     MFR*  90 capsule  1   No current facility-administered medications on file prior to visit.    BP 130/90  Pulse 84  Temp(Src) 98 F (36.7 C) (Oral)  Resp 20  Ht 6' (1.829 m)  Wt 177 lb (80.287 kg)  BMI 24.00 kg/m2  SpO2 97%     Review of Systems  Constitutional: Negative for fever, chills, appetite change and fatigue.  HENT: Negative for congestion, dental problem, ear pain, hearing loss, sore throat, tinnitus, trouble swallowing and voice change.   Eyes: Negative for pain, discharge and visual disturbance.  Respiratory: Negative for  cough, chest tightness, wheezing and stridor.   Cardiovascular: Negative for chest pain, palpitations and leg swelling.  Gastrointestinal: Negative for nausea, vomiting, abdominal pain, diarrhea, constipation, blood in stool and abdominal distention.  Genitourinary: Negative for urgency, hematuria, flank pain, discharge, difficulty urinating and genital sores.  Musculoskeletal: Negative for arthralgias, back pain, gait problem, joint swelling, myalgias and neck stiffness.  Skin: Negative for rash.  Neurological: Negative for dizziness, syncope, speech difficulty, weakness, numbness and headaches.  Hematological: Negative for adenopathy. Does not bruise/bleed easily.  Psychiatric/Behavioral: Positive for sleep disturbance. Negative for behavioral problems and dysphoric mood. The patient is not nervous/anxious.        Objective:   Physical Exam  Constitutional: He appears well-developed and well-nourished.  No distress.  Blood pressure 130/80          Assessment & Plan:   PMR.  We'll decrease prednisone to 12 point 5 mg daily, all in the morning Check sedimentation rate The patient does well on this, dose.  We'll decrease to 10 mg every morning in 4 weeks  Recheck 3 months

## 2014-07-24 NOTE — Patient Instructions (Signed)
Decrease prednisone to 12.5 milligrams each morning If you remain stable on this dose in 4 weeks decrease further to 10 mg every morning  Limit your sodium (Salt) intake    It is important that you exercise regularly, at least 20 minutes 3 to 4 times per week.  If you develop chest pain or shortness of breath seek  medical attention.  Return in 3 months for follow-up

## 2014-07-24 NOTE — Progress Notes (Signed)
Pre visit review using our clinic review tool, if applicable. No additional management support is needed unless otherwise documented below in the visit note. 

## 2014-07-30 ENCOUNTER — Telehealth: Payer: Self-pay | Admitting: Internal Medicine

## 2014-07-30 MED ORDER — SIMVASTATIN 40 MG PO TABS: 40.0000 mg | ORAL_TABLET | Freq: Every day | ORAL | Status: DC

## 2014-07-30 NOTE — Telephone Encounter (Signed)
Please advise on Lab result from 10/6, Sed rate. Rx was sent to pharmacy.

## 2014-07-30 NOTE — Telephone Encounter (Signed)
OK Rx Notify patient  That  sedimentation rate was normal

## 2014-07-30 NOTE — Telephone Encounter (Signed)
Pt calling to request the following:  1.  RX for simvastatin (ZOCOR) 40 MG tablet sent to [facility], pt is switching to mail order pharmacy.    2. Lab results from appt on 07/24/14.

## 2014-08-01 NOTE — Telephone Encounter (Signed)
Please advise if pt should continue 10 mg of Prednisone daily?

## 2014-08-01 NOTE — Telephone Encounter (Signed)
Left message on voicemail to call office.  

## 2014-08-01 NOTE — Telephone Encounter (Signed)
Please have patient try the prednisone taper as discussed and outlined in his after visit summary

## 2014-08-02 NOTE — Telephone Encounter (Signed)
Pt called back, told him Sed rate was normal and Dr. Raliegh Ip said to try the prednisone taper as discussed in your after visit summary. Pt verbalized understanding and stated he is taking 12.5 mg daily and will try to do 7.5 mg in the am and 5 mg in the pm due to still stiff in the morning and would like it noted in his chart. Told pt okay will make note.

## 2014-09-27 ENCOUNTER — Encounter (HOSPITAL_COMMUNITY): Payer: Self-pay | Admitting: Vascular Surgery

## 2014-09-28 ENCOUNTER — Telehealth: Payer: Self-pay | Admitting: Internal Medicine

## 2014-09-28 NOTE — Telephone Encounter (Signed)
Dr. Shawna Orleans, can you please see message and advise.

## 2014-09-28 NOTE — Telephone Encounter (Signed)
PLEASE NOTE: All timestamps contained within this report are represented as [place] Standard Time. CONFIDENTIALTY NOTICE: This fax transmission is intended only for the addressee. It contains information that is legally privileged, confidential or otherwise protected from use or disclosure. If you are not the intended recipient, you are strictly prohibited from reviewing, disclosing, copying using or disseminating any of this information or taking any action in reliance on or regarding this information. If you have received this fax in error, please notify us immediately by telephone so that we can arrange for its return to [place]. Phone: [phone], Toll-Free: [phone], Fax: [phone] Page: 1 of 2 Call Id: [facility]ll[facility] Day - Client [place] Patient Name: Brandon Dixon Gender: Male DOB: Sep 17, 1946 Age: 68 Y 81 M 11 D Return Phone Number: [phone] (Primary) Address: City/State/Zip: [place] [address] Client [facility] Day - Client Client Site [place] - Day Physician Simonne Martinet Contact Type Call Call Type Triage / Clinical Relationship To Patient Self Return Phone Number [phone] (Primary) Chief Complaint Joint Pain Initial Comment Caller states the Dr put him on Prednisone for inflammation. Just started dropping down to 10mg  in the morning. Waking up very stiff in the morning, can hardly move. Wants to know if he can take 5mg  at night. Nurse Assessment Nurse: Wynetta Emery, RN, Baker Janus Date/Time Eilene Ghazi Time): 09/28/2014 8:49:56 AM Confirm and document reason for call. If symptomatic, describe symptoms. ---Brandon Dixon is taking prednisone 10mg  x 4 days started feeling stiff in am and want want to take 10mg  in am and 5mg  hs to get rid of stiffness Has the patient traveled out of the country within the last 30 days? ---No Does the patient require  triage? ---No Please document clinical information provided and list any resource used. ---Nurse will check with office to see what they want him to do. has f/u appt with MD in January Guidelines [place] Notes Nurse Date/Time Eilene Ghazi Time) Disp. Time Eilene Ghazi Time) Disposition Final User 09/28/2014 9:00:42 AM Clinical Call Yes Wynetta Emery, RN, Baker Janus After Care Instructions Given Call Event Type User Date / Time Description Comments User: Michele Rockers, RN Date/Time Eilene Ghazi Time): 09/28/2014 9:00:32 AM Spoke with Apolonio Schneiders and advised he was taking 12.5 mg of prednisione ---7.5mg  in am 5mg  in hs --dropped down to 10mg  --5mg  in am and 5mg  in hs --last 4 days have done 10mg  in am d/t extreme stiffness --wants to know if he can do 10mg  in am and 5mg  at hs. Nurse advised office would call him with answers needed. PLEASE NOTE: All timestamps contained within this report are represented as [place] Standard Time. CONFIDENTIALTY NOTICE: This fax transmission is intended only for the addressee. It contains information that is legally privileged, confidential or otherwise protected from use or disclosure. If you are not the intended recipient, you are strictly prohibited from reviewing, disclosing, copying using or disseminating any of this information or taking any action in reliance on or regarding this information. If you have received this fax in error, please notify us immediately by telephone so that we can arrange for its return to [place]. Phone: [phone], Toll-Free: [phone], Fax: [phone] Page: 2 of 2 Call Id: [id number]

## 2014-09-28 NOTE — Telephone Encounter (Signed)
Ok to continue prednisone 10 mg in am and 5 mg in pm for 1 month until he can be seen by Dr. Raliegh Ip.

## 2014-09-28 NOTE — Telephone Encounter (Signed)
Left detailed message on voicemail mobile phone. Okay to take Prednisone 10 mg in AM and 5 mg in PM and make follow up appt with Dr. Raliegh Ip any questions call on Monday.

## 2014-10-01 MED ORDER — PREDNISONE 5 MG PO TABS: ORAL_TABLET | ORAL | Status: DC

## 2014-10-01 NOTE — Telephone Encounter (Signed)
Spoke to pt, asked him if he got my message on Friday? Pt said yes and he is feeling better but needs a refill. Told pt okay will send refill to pharmacy and make appointment with Dr. Raliegh Ip. Pt said he already has appointment scheduled for Jan. Told pt okay, Rx sent to pharmacy.

## 2014-11-15 ENCOUNTER — Encounter: Payer: Self-pay | Admitting: Internal Medicine

## 2014-11-15 ENCOUNTER — Ambulatory Visit (INDEPENDENT_AMBULATORY_CARE_PROVIDER_SITE_OTHER): Payer: BLUE CROSS/BLUE SHIELD | Admitting: Internal Medicine

## 2014-11-15 VITALS — BP 140/76 | HR 79 | Temp 97.8°F | Resp 20 | Ht 71.25 in | Wt 178.0 lb

## 2014-11-15 DIAGNOSIS — E7800 Pure hypercholesterolemia, unspecified: Secondary | ICD-10-CM

## 2014-11-15 DIAGNOSIS — J3089 Other allergic rhinitis: Secondary | ICD-10-CM

## 2014-11-15 DIAGNOSIS — I739 Peripheral vascular disease, unspecified: Secondary | ICD-10-CM

## 2014-11-15 DIAGNOSIS — Z86010 Personal history of colonic polyps: Secondary | ICD-10-CM | POA: Diagnosis not present

## 2014-11-15 DIAGNOSIS — I70219 Atherosclerosis of native arteries of extremities with intermittent claudication, unspecified extremity: Secondary | ICD-10-CM

## 2014-11-15 DIAGNOSIS — Z0000 Encounter for general adult medical examination without abnormal findings: Secondary | ICD-10-CM

## 2014-11-15 DIAGNOSIS — M353 Polymyalgia rheumatica: Secondary | ICD-10-CM

## 2014-11-15 LAB — CBC WITH DIFFERENTIAL/PLATELET
Basophils Absolute: 0 10*3/uL (ref 0.0–0.1)
Basophils Relative: 0.2 % (ref 0.0–3.0)
Eosinophils Absolute: 0 10*3/uL (ref 0.0–0.7)
Eosinophils Relative: 0.3 % (ref 0.0–5.0)
HCT: 46.2 % (ref 39.0–52.0)
Hemoglobin: 15.4 g/dL (ref 13.0–17.0)
Lymphocytes Relative: 10.2 % — ABNORMAL LOW (ref 12.0–46.0)
Lymphs Abs: 1.2 10*3/uL (ref 0.7–4.0)
MCHC: 33.5 g/dL (ref 30.0–36.0)
MCV: 88.3 fl (ref 78.0–100.0)
Monocytes Absolute: 0.5 10*3/uL (ref 0.1–1.0)
Monocytes Relative: 4.3 % (ref 3.0–12.0)
Neutro Abs: 9.9 10*3/uL — ABNORMAL HIGH (ref 1.4–7.7)
Neutrophils Relative %: 85 % — ABNORMAL HIGH (ref 43.0–77.0)
Platelets: 264 10*3/uL (ref 150.0–400.0)
RBC: 5.22 Mil/uL (ref 4.22–5.81)
RDW: 14.6 % (ref 11.5–15.5)
WBC: 11.6 10*3/uL — ABNORMAL HIGH (ref 4.0–10.5)

## 2014-11-15 LAB — LIPID PANEL
Cholesterol: 194 mg/dL (ref 0–200)
HDL: 91.6 mg/dL (ref >39.00)
LDL Cholesterol: 79 mg/dL (ref 0–99)
NonHDL: 102.4
Total CHOL/HDL Ratio: 2
Triglycerides: 115 mg/dL (ref 0.0–149.0)
VLDL: 23 mg/dL (ref 0.0–40.0)

## 2014-11-15 LAB — COMPREHENSIVE METABOLIC PANEL
ALT: 24 U/L (ref 0–53)
AST: 24 U/L (ref 0–37)
Albumin: 4 g/dL (ref 3.5–5.2)
Alkaline Phosphatase: 88 U/L (ref 39–117)
BUN: 13 mg/dL (ref 6–23)
CO2: 28 mEq/L (ref 19–32)
Calcium: 10.2 mg/dL (ref 8.4–10.5)
Chloride: 101 mEq/L (ref 96–112)
Creatinine, Ser: 0.87 mg/dL (ref 0.40–1.50)
GFR: 112.03 mL/min (ref >60.00)
Glucose, Bld: 110 mg/dL — ABNORMAL HIGH (ref 70–99)
Potassium: 4.4 mEq/L (ref 3.5–5.1)
Sodium: 140 mEq/L (ref 135–145)
Total Bilirubin: 0.4 mg/dL (ref 0.2–1.2)
Total Protein: 7.8 g/dL (ref 6.0–8.3)

## 2014-11-15 LAB — TSH: TSH: 1.4 u[IU]/mL (ref 0.35–4.50)

## 2014-11-15 LAB — SEDIMENTATION RATE: Sed Rate: 25 mm/hr — ABNORMAL HIGH (ref 0–22)

## 2014-11-15 NOTE — Progress Notes (Signed)
Patient ID: Brandon Dixon, male   DOB: Nov 30, 1945, 69 y.o.   MRN: [id number]  Subjective:    Patient ID: Brandon Dixon, male    DOB: August 08, 1946, 69 y.o.   MRN: [id number]  HPI 69  year-old patient who is seen today for a wellness exam;   medical problems include dyslipidemia mild BPH and allergic rhinitis.  He has been disabled since 2006 following left shoulder surgery.  Patient is doing quite. Since his last wellness exam.  He has been evaluated and treated for claudication involving the left leg and treated for polymyalgia rheumatica  Here for Medicare AWV:  1. Risk factors based on Past M, S, F history: cardiovascular risk factors include dyslipidemia  2. Physical Activities: remains quite active;  3. Depression/mood: no h/o  depression and mood disorder  4. Hearing: no hearing deficits  5. ADL's: independent in all aspects of daily living  6. Fall Risk: low  7. Home Safety: no problems identified  8. Height, weight, &visual acuity:height and weight stable. No difficulty with visual acuity  9. Counseling: heart healthy diet, salt restricted diet. All encouraged  10. Labs ordered based on risk factors:laboratory profile, including lipid panel will be reviewed  11. Referral Coordination- not appropriate at this time. will need colonoscopy this year 74. Care Plan- heart healthy diet more regular and vigorous exercise. Encouraged  13. Cognitive Assessment- alert and oriented, with normal affect  14.  Preventive services will include annual health exams.  He will be seen once or twice annually by vascular surgery.  Colonoscopies every 5 years. Patient was provided with a written and personalized care plan 15.  Provider list update includes primary care.  GI vascular surgery     Allergies (verified):  No Known Drug Allergies   Past History:  Past Medical History:    Colonic polyps, hx of  GERD  Benign prostatic hypertrophy  Allergic rhinitis/septal deviation  Left leg  claudication Polymyalgia rheumatica  Past Surgical History:    Rotator cuff repair  Vasectomy  colonoscopy November 2005, January 2011 Dr. Collene Mares  stress Myoview April 2009   Family History:   father died at 60, lung cancer  mother died at 70. CAD  5 brothers, positive diabetes ; one brother with recent diagnosis of pancreatic cancer (2013) two sisters positive for hypertension   Social History:   Married  3 daughters     Review of Systems  Constitutional: Negative for fever, chills, activity change, appetite change and fatigue.  HENT: Negative for congestion, dental problem, ear pain, hearing loss, mouth sores, rhinorrhea, sinus pressure, sneezing, tinnitus, trouble swallowing and voice change.   Eyes: Negative for photophobia, pain, redness and visual disturbance.  Respiratory: Negative for apnea, cough, choking, chest tightness, shortness of breath and wheezing.   Cardiovascular: Negative for chest pain, palpitations and leg swelling.  Gastrointestinal: Negative for nausea, vomiting, abdominal pain, diarrhea, constipation, blood in stool, abdominal distention, anal bleeding and rectal pain.  Genitourinary: Negative for dysuria, urgency, frequency, hematuria, flank pain, decreased urine volume, discharge, penile swelling, scrotal swelling, difficulty urinating, genital sores and testicular pain.  Musculoskeletal: Negative for myalgias, back pain, joint swelling, arthralgias, gait problem, neck pain and neck stiffness.  Skin: Negative for color change, rash and wound.  Neurological: Negative for dizziness, tremors, seizures, syncope, facial asymmetry, speech difficulty, weakness, light-headedness, numbness and headaches.  Hematological: Negative for adenopathy. Does not bruise/bleed easily.  Psychiatric/Behavioral: Negative for suicidal ideas, hallucinations, behavioral problems, confusion, sleep disturbance, self-injury, dysphoric mood, decreased  concentration and agitation. The  patient is not nervous/anxious.   been     Objective:   Physical Exam  Constitutional: He appears well-developed and well-nourished.  Repeat blood pressure 120/80 in both arms  HENT:  Head: Normocephalic and atraumatic.  Right Ear: External ear normal.  Left Ear: External ear normal.  Nose: Nose normal.  Mouth/Throat: Oropharynx is clear and moist.  Congested turbinates  Eyes: Conjunctivae and EOM are normal. Pupils are equal, round, and reactive to light. No scleral icterus.  Neck: Normal range of motion. Neck supple. No JVD present. No thyromegaly present.  Cardiovascular: Regular rhythm and normal heart sounds.  Exam reveals no gallop and no friction rub.   No murmur heard. Pedal pulses absent except for a full right dorsalis pedis pulse  Pulmonary/Chest: Effort normal and breath sounds normal. He exhibits no tenderness.  Suggestion of mild gynecomastia  Abdominal: Soft. Bowel sounds are normal. He exhibits no distension and no mass. There is no tenderness.  Genitourinary: Penis normal.  +2  enlarged  Musculoskeletal: Normal range of motion. He exhibits no edema or tenderness.  Lymphadenopathy:    He has no cervical adenopathy.  Neurological: He is alert. He has normal reflexes. No cranial nerve deficit. Coordination normal.  Skin: Skin is warm and dry. No rash noted.  Mild digital clubbing  Psychiatric: He has a normal mood and affect. His behavior is normal.          Assessment & Plan:  Preventive health examination History colonic polyps Dyslipidemia Allergic rhinitis.  Symptomatic BPH.  Left leg claudication.  Stable.  Continue aggressive risk factor modification PMR.  Will check a sedimentation rate   Laboratory profile reviewed  Regular exercise regimen encouraged  Heart healthy diet encouraged  Continue statin therapy

## 2014-11-15 NOTE — Patient Instructions (Addendum)
Decrease prednisone to 7.5 milligrams in the morning and 5 mg in the afternoon  Limit your sodium (Salt) intake    It is important that you exercise regularly, at least 20 minutes 3 to 4 times per week.  If you develop chest pain or shortness of breath seek  medical attention.  Schedule your colonoscopy to help detect colon cancer.  Health Maintenance A healthy lifestyle and preventative care can promote health and wellness.  Maintain regular health, dental, and eye exams.  Eat a healthy diet. Foods like vegetables, fruits, whole grains, low-fat dairy products, and lean protein foods contain the nutrients you need and are low in calories. Decrease your intake of foods high in solid fats, added sugars, and salt. Get information about a proper diet from your health care provider, if necessary.  Regular physical exercise is one of the most important things you can do for your health. Most adults should get at least 150 minutes of moderate-intensity exercise (any activity that increases your heart rate and causes you to sweat) each week. In addition, most adults need muscle-strengthening exercises on 2 or more days a week.   Maintain a healthy weight. The body mass index (BMI) is a screening tool to identify possible weight problems. It provides an estimate of body fat based on height and weight. Your health care provider can find your BMI and can help you achieve or maintain a healthy weight. For males 20 years and older:  A BMI below 18.5 is considered underweight.  A BMI of 18.5 to 24.9 is normal.  A BMI of 25 to 29.9 is considered overweight.  A BMI of 30 and above is considered obese.  Maintain normal blood lipids and cholesterol by exercising and minimizing your intake of saturated fat. Eat a balanced diet with plenty of fruits and vegetables. Blood tests for lipids and cholesterol should begin at age 76 and be repeated every 5 years. If your lipid or cholesterol levels are high, you  are over age 67, or you are at high risk for heart disease, you may need your cholesterol levels checked more frequently.Ongoing high lipid and cholesterol levels should be treated with medicines if diet and exercise are not working.  If you smoke, find out from your health care provider how to quit. If you do not use tobacco, do not start.  Lung cancer screening is recommended for adults aged 42-80 years who are at high risk for developing lung cancer because of a history of smoking. A yearly low-dose CT scan of the lungs is recommended for people who have at least a 30-pack-year history of smoking and are current smokers or have quit within the past 15 years. A pack year of smoking is smoking an average of 1 pack of cigarettes a day for 1 year (for example, a 30-pack-year history of smoking could mean smoking 1 pack a day for 30 years or 2 packs a day for 15 years). Yearly screening should continue until the smoker has stopped smoking for at least 15 years. Yearly screening should be stopped for people who develop a health problem that would prevent them from having lung cancer treatment.  If you choose to drink alcohol, do not have more than 2 drinks per day. One drink is considered to be 12 oz (360 mL) of beer, 5 oz (150 mL) of wine, or 1.5 oz (45 mL) of liquor.  Avoid the use of street drugs. Do not share needles with anyone. Ask for help if  you need support or instructions about stopping the use of drugs.  High blood pressure causes heart disease and increases the risk of stroke. Blood pressure should be checked at least every 1-2 years. Ongoing high blood pressure should be treated with medicines if weight loss and exercise are not effective.  If you are 39-15 years old, ask your health care provider if you should take aspirin to prevent heart disease.  Diabetes screening involves taking a blood sample to check your fasting blood sugar level. This should be done once every 3 years after age 58  if you are at a normal weight and without risk factors for diabetes. Testing should be considered at a younger age or be carried out more frequently if you are overweight and have at least 1 risk factor for diabetes.  Colorectal cancer can be detected and often prevented. Most routine colorectal cancer screening begins at the age of 68 and continues through age 107. However, your health care provider may recommend screening at an earlier age if you have risk factors for colon cancer. On a yearly basis, your health care provider may provide home test kits to check for hidden blood in the stool. A small camera at the end of a tube may be used to directly examine the colon (sigmoidoscopy or colonoscopy) to detect the earliest forms of colorectal cancer. Talk to your health care provider about this at age 39 when routine screening begins. A direct exam of the colon should be repeated every 5-10 years through age 13, unless early forms of precancerous polyps or small growths are found.  People who are at an increased risk for hepatitis B should be screened for this virus. You are considered at high risk for hepatitis B if:  You were born in a country where hepatitis B occurs often. Talk with your health care provider about which countries are considered high risk.  Your parents were born in a high-risk country and you have not received a shot to protect against hepatitis B (hepatitis B vaccine).  You have HIV or AIDS.  You use needles to inject street drugs.  You live with, or have sex with, someone who has hepatitis B.  You are a man who has sex with other men (MSM).  You get hemodialysis treatment.  You take certain medicines for conditions like cancer, organ transplantation, and autoimmune conditions.  Hepatitis C blood testing is recommended for all people born from [birth year] through 1965 and any individual with known risk factors for hepatitis C.  Healthy men should no longer receive  prostate-specific antigen (PSA) blood tests as part of routine cancer screening. Talk to your health care provider about prostate cancer screening.  Testicular cancer screening is not recommended for adolescents or adult males who have no symptoms. Screening includes self-exam, a health care provider exam, and other screening tests. Consult with your health care provider about any symptoms you have or any concerns you have about testicular cancer.  Practice safe sex. Use condoms and avoid high-risk sexual practices to reduce the spread of sexually transmitted infections (STIs).  You should be screened for STIs, including gonorrhea and chlamydia if:  You are sexually active and are younger than 24 years.  You are older than 24 years, and your health care provider tells you that you are at risk for this type of infection.  Your sexual activity has changed since you were last screened, and you are at an increased risk for chlamydia or gonorrhea. Ask your  health care provider if you are at risk.  If you are at risk of being infected with HIV, it is recommended that you take a prescription medicine daily to prevent HIV infection. This is called pre-exposure prophylaxis (PrEP). You are considered at risk if:  You are a man who has sex with other men (MSM).  You are a heterosexual man who is sexually active with multiple partners.  You take drugs by injection.  You are sexually active with a partner who has HIV.  Talk with your health care provider about whether you are at high risk of being infected with HIV. If you choose to begin PrEP, you should first be tested for HIV. You should then be tested every 3 months for as long as you are taking PrEP.  Use sunscreen. Apply sunscreen liberally and repeatedly throughout the day. You should seek shade when your shadow is shorter than you. Protect yourself by wearing long sleeves, pants, a wide-brimmed hat, and sunglasses year round whenever you are  outdoors.  Tell your health care provider of new moles or changes in moles, especially if there is a change in shape or color. Also, tell your health care provider if a mole is larger than the size of a pencil eraser.  A one-time screening for abdominal aortic aneurysm (AAA) and surgical repair of large AAAs by ultrasound is recommended for men aged 69-75 years who are current or former smokers.  Stay current with your vaccines (immunizations). Document Released: 04/02/2008 Document Revised: 10/10/2013 Document Reviewed: 03/02/2011 [facility] Patient Information 2015 [place], [place]. This information is not intended to replace advice given to you by your health care provider. Make sure you discuss any questions you have with your health care provider. Fat and Cholesterol Control Diet Fat and cholesterol levels in your blood and organs are influenced by your diet. High levels of fat and cholesterol may lead to diseases of the heart, small and large blood vessels, gallbladder, liver, and pancreas. CONTROLLING FAT AND CHOLESTEROL WITH DIET Although exercise and lifestyle factors are important, your diet is key. That is because certain foods are known to raise cholesterol and others to lower it. The goal is to balance foods for their effect on cholesterol and more importantly, to replace saturated and trans fat with other types of fat, such as monounsaturated fat, polyunsaturated fat, and omega-3 fatty acids. On average, a person should consume no more than 15 to 17 g of saturated fat daily. Saturated and trans fats are considered "bad" fats, and they will raise LDL cholesterol. Saturated fats are primarily found in animal products such as meats, butter, and cream. However, that does not mean you need to give up all your favorite foods. Today, there are good tasting, low-fat, low-cholesterol substitutes for most of the things you like to eat. Choose low-fat or nonfat alternatives. Choose round or loin cuts of  red meat. These types of cuts are lowest in fat and cholesterol. Chicken (without the skin), fish, veal, and ground [place] breast are great choices. Eliminate fatty meats, such as hot dogs and salami. Even shellfish have little or no saturated fat. Have a 3 oz (85 g) portion when you eat lean meat, poultry, or fish. Trans fats are also called "partially hydrogenated oils." They are oils that have been scientifically manipulated so that they are solid at room temperature resulting in a longer shelf life and improved taste and texture of foods in which they are added. Trans fats are found in stick margarine,  some tub margarines, cookies, crackers, and baked goods.  When baking and cooking, oils are a great substitute for butter. The monounsaturated oils are especially beneficial since it is believed they lower LDL and raise HDL. The oils you should avoid entirely are saturated tropical oils, such as coconut and palm.  Remember to eat a lot from food groups that are naturally free of saturated and trans fat, including fish, fruit, vegetables, beans, grains (barley, rice, couscous, bulgur wheat), and pasta (without cream sauces).  IDENTIFYING FOODS THAT LOWER FAT AND CHOLESTEROL  Soluble fiber may lower your cholesterol. This type of fiber is found in fruits such as apples, vegetables such as broccoli, potatoes, and carrots, legumes such as beans, peas, and lentils, and grains such as barley. Foods fortified with plant sterols (phytosterol) may also lower cholesterol. You should eat at least 2 g per day of these foods for a cholesterol lowering effect.  Read package labels to identify low-saturated fats, trans fat free, and low-fat foods at the supermarket. Select cheeses that have only 2 to 3 g saturated fat per ounce. Use a heart-healthy tub margarine that is free of trans fats or partially hydrogenated oil. When buying baked goods (cookies, crackers), avoid partially hydrogenated oils. Breads and muffins should  be made from whole grains (whole-wheat or whole oat flour, instead of "flour" or "enriched flour"). Buy non-creamy canned soups with reduced salt and no added fats.  FOOD PREPARATION TECHNIQUES  Never deep-fry. If you must fry, either stir-fry, which uses very little fat, or use non-stick cooking sprays. When possible, broil, bake, or roast meats, and steam vegetables. Instead of putting butter or margarine on vegetables, use lemon and herbs, applesauce, and cinnamon (for squash and sweet potatoes). Use nonfat yogurt, salsa, and low-fat dressings for salads.  LOW-SATURATED FAT / LOW-FAT FOOD SUBSTITUTES Meats / Saturated Fat (g)  Avoid: Steak, marbled (3 oz/85 g) / 11 g  Choose: Steak, lean (3 oz/85 g) / 4 g  Avoid: Hamburger (3 oz/85 g) / 7 g  Choose: Hamburger, lean (3 oz/85 g) / 5 g  Avoid: Ham (3 oz/85 g) / 6 g  Choose: Ham, lean cut (3 oz/85 g) / 2.4 g  Avoid: Chicken, with skin, dark meat (3 oz/85 g) / 4 g  Choose: Chicken, skin removed, dark meat (3 oz/85 g) / 2 g  Avoid: Chicken, with skin, light meat (3 oz/85 g) / 2.5 g  Choose: Chicken, skin removed, light meat (3 oz/85 g) / 1 g Dairy / Saturated Fat (g)  Avoid: Whole milk (1 cup) / 5 g  Choose: Low-fat milk, 2% (1 cup) / 3 g  Choose: Low-fat milk, 1% (1 cup) / 1.5 g  Choose: Skim milk (1 cup) / 0.3 g  Avoid: Hard cheese (1 oz/28 g) / 6 g  Choose: Skim milk cheese (1 oz/28 g) / 2 to 3 g  Avoid: Cottage cheese, 4% fat (1 cup) / 6.5 g  Choose: Low-fat cottage cheese, 1% fat (1 cup) / 1.5 g  Avoid: Ice cream (1 cup) / 9 g  Choose: Sherbet (1 cup) / 2.5 g  Choose: Nonfat frozen yogurt (1 cup) / 0.3 g  Choose: Frozen fruit bar / trace  Avoid: Whipped cream (1 tbs) / 3.5 g  Choose: Nondairy whipped topping (1 tbs) / 1 g Condiments / Saturated Fat (g)  Avoid: Mayonnaise (1 tbs) / 2 g  Choose: Low-fat mayonnaise (1 tbs) / 1 g  Avoid: Butter (1 tbs) / 7  g  Choose: Extra light margarine (1 tbs) / 1  g  Avoid: Coconut oil (1 tbs) / 11.8 g  Choose: Olive oil (1 tbs) / 1.8 g  Choose: Corn oil (1 tbs) / 1.7 g  Choose: Safflower oil (1 tbs) / 1.2 g  Choose: Sunflower oil (1 tbs) / 1.4 g  Choose: Soybean oil (1 tbs) / 2.4 g  Choose: Canola oil (1 tbs) / 1 g Document Released: 10/05/2005 Document Revised: 01/30/2013 Document Reviewed: 01/03/2014 [employer] Patient Information 2015 [place], [place]. This information is not intended to replace advice given to you by your health care provider. Make sure you discuss any questions you have with your health care provider.

## 2014-11-15 NOTE — Progress Notes (Signed)
Pre visit review using our clinic review tool, if applicable. No additional management support is needed unless otherwise documented below in the visit note. 

## 2014-11-20 ENCOUNTER — Encounter: Payer: Self-pay | Admitting: Vascular Surgery

## 2014-11-21 ENCOUNTER — Ambulatory Visit (INDEPENDENT_AMBULATORY_CARE_PROVIDER_SITE_OTHER): Payer: BLUE CROSS/BLUE SHIELD | Admitting: Vascular Surgery

## 2014-11-21 ENCOUNTER — Encounter: Payer: Self-pay | Admitting: Vascular Surgery

## 2014-11-21 ENCOUNTER — Ambulatory Visit (HOSPITAL_COMMUNITY)
Admission: RE | Admit: 2014-11-21 | Discharge: 2014-11-21 | Disposition: A | Discharge status: Home / Self Care | Payer: BLUE CROSS/BLUE SHIELD | Source: Ambulatory Visit | Attending: Vascular Surgery | Admitting: Vascular Surgery

## 2014-11-21 ENCOUNTER — Other Ambulatory Visit: Payer: Self-pay | Admitting: Vascular Surgery

## 2014-11-21 VITALS — BP 129/76 | HR 80 | Ht 71.5 in | Wt 179.9 lb

## 2014-11-21 DIAGNOSIS — I70219 Atherosclerosis of native arteries of extremities with intermittent claudication, unspecified extremity: Secondary | ICD-10-CM | POA: Insufficient documentation

## 2014-11-21 DIAGNOSIS — I739 Peripheral vascular disease, unspecified: Secondary | ICD-10-CM | POA: Insufficient documentation

## 2014-11-21 NOTE — Progress Notes (Signed)
    Established Intermittent Claudication  History of Present Illness   Brandon Dixon is a 69 y.o. male who was seen in our office with left lower extremity claudication.  He previously underwent arteriography which showed a left superficial femoral artery occlusion with reconstitution of the above-knee popliteal artery. Dr. Scot Dock discussed possible revascularization on his last visit 05/23/2014.  Brandon Dixon felt like his symptoms were getting better and he had been walking a mile a day for exercise.  Therefore we would like to continue with conservative treatment.  He is here today for f/u visit and f/u ABI's.  The patient's PMH, PSH, SH, FamHx, Med, and Allergies are unchanged from 05/23/2014.    Physical Examination  Filed Vitals:   11/21/14 1546  Height: 5' 11.5" (1.816 m)  Weight: 179 lb 14.4 oz (81.602 kg)  SpO2: 95%   Body mass index is 24.74 kg/(m^2).  General:normocephalic Pulmonary: Sym exp, good air movt, CTAB, no rales, rhonchi, & wheezing  Cardiac: RRR, Nl S1, S2, no Murmurs, rubs or gallops Vascular: Vessel Right Left  Radial Palpable Palpable  Brachial Palpable Palpable  Carotid Palpable, without bruit Palpable, without bruit  Aorta Not palpable N/A  Femoral Palpable Palpable  Popliteal Not palpable Not palpable  PT Palpable notPalpable  DP Palpable notPalpable   Gastrointestinal: soft, NT Musculoskeletal: M/S 5/5 throughout , Extremities without ischemic changes Neurologic: Pain and light touch intact in extremities Non-Invasive Vascular Imaging ABI (Date: 11/21/2014)  R: 1.0 PT biphasic flow, DP triphasic flow  L: 0.70 DP/PT monophasic flow     Medical Decision Making  PAD with left superficial femoral artery occlusion with reconstitution of the above-knee popliteal artery.   Brandon Dixon is a 69 y.o. male who presents with:  left leg intermittent claudication without evidence of critical limb ischemia. He is walking up 3 miles a day and wishes  to increase his activity by going to the gym.  He does still have symptoms after walking 10 min with cramps, but he continues to walk and he reports the symptoms subside.  He has no rest pain.  He will continue conservative management.  If he has trouble or concerns such as non healing wounds, skin discoloration or rest pain he will call us sooner than later.  He will f/u in 1 year for ABI's.  He was seen today in conjunction with Dr. Scot Dock  [facility] Office: [phone]   11/21/2014, 3:49 PM

## 2014-12-21 ENCOUNTER — Other Ambulatory Visit: Payer: Self-pay | Admitting: Internal Medicine

## 2014-12-25 ENCOUNTER — Other Ambulatory Visit: Payer: Self-pay | Admitting: *Deleted

## 2014-12-25 MED ORDER — PREDNISONE 5 MG PO TABS: ORAL_TABLET | ORAL | Status: DC

## 2015-01-15 ENCOUNTER — Telehealth: Payer: Self-pay | Admitting: Internal Medicine

## 2015-01-15 NOTE — Telephone Encounter (Signed)
All ok; ldl cholesterol  improved to 79!

## 2015-01-15 NOTE — Telephone Encounter (Signed)
Left detailed message on personal voicemail labs are all okay per Dr.K and LDL Cholesterol has improved to 79. Call if any questions.

## 2015-01-15 NOTE — Telephone Encounter (Signed)
Pt would like blood work results from jan 2016. Ok to leave message on ans machine

## 2015-01-15 NOTE — Telephone Encounter (Signed)
Pt calling for lab results from 11/15/2014. Please advise.

## 2015-02-22 ENCOUNTER — Encounter: Payer: Self-pay | Admitting: Internal Medicine

## 2015-02-22 ENCOUNTER — Ambulatory Visit (INDEPENDENT_AMBULATORY_CARE_PROVIDER_SITE_OTHER): Payer: BC Managed Care – PPO | Admitting: Internal Medicine

## 2015-02-22 VITALS — BP 130/70 | HR 81 | Temp 98.1°F | Resp 20 | Ht 71.5 in | Wt 181.0 lb

## 2015-02-22 DIAGNOSIS — I739 Peripheral vascular disease, unspecified: Secondary | ICD-10-CM

## 2015-02-22 DIAGNOSIS — M353 Polymyalgia rheumatica: Secondary | ICD-10-CM

## 2015-02-22 DIAGNOSIS — I70219 Atherosclerosis of native arteries of extremities with intermittent claudication, unspecified extremity: Secondary | ICD-10-CM

## 2015-02-22 LAB — SEDIMENTATION RATE: Sed Rate: 18 mm/hr (ref 0–22)

## 2015-02-22 NOTE — Progress Notes (Signed)
Pre visit review using our clinic review tool, if applicable. No additional management support is needed unless otherwise documented below in the visit note. 

## 2015-02-22 NOTE — Progress Notes (Signed)
Subjective:    Patient ID: Brandon Dixon, male    DOB: August 27, 1946, 69 y.o.   MRN: [id number]  HPI  Wt Readings from Last 3 Encounters:  02/22/15 181 lb (82.101 kg)  11/21/14 179 lb 14.4 oz (81.602 kg)  11/15/14 178 lb (80.60 kg)   69 year old patient who is seen today in follow-up.  He has stable left leg claudication.  This remains stable and he has very little discomfort He is seen today in follow-up for PMR.  Remains on prednisone 7.5 milligrams in the morning and 5 mg in the afternoon.  He complains of some minor shoulder stiffness only  Past Medical History  Diagnosis Date  . ALLERGIC RHINITIS 09/26/2007  . BENIGN PROSTATIC HYPERTROPHY 05/27/2007  . COLONIC POLYPS, HX OF 05/27/2007  . DERMATITIS 08/25/2010  . GERD 05/27/2007  . HYPERCHOLESTEROLEMIA 09/26/2007  . Peripheral vascular disease     History   Social History  . Marital Status: Married    Spouse Name: N/A  . Number of Children: N/A  . Years of Education: N/A   Occupational History  . Not on file.   Social History Main Topics  . Smoking status: Former Smoker    Quit date: 10/19/2001  . Smokeless tobacco: Never Used  . Alcohol Use: 3.6 oz/week    6 Cans of beer per week  . Drug Use: No  . Sexual Activity: Not on file   Other Topics Concern  . Not on file   Social History Narrative    Past Surgical History  Procedure Laterality Date  . Vasectomy    . Rotator cuff repair    . Lower extremity angiogram Left 04/30/2014    Procedure: LOWER EXTREMITY ANGIOGRAM;  Surgeon: Angelia Mould, MD;  Location: [facility] CATH LAB;  Service: Cardiovascular;  Laterality: Left;    Family History  Problem Relation Age of Onset  . Heart disease Mother   . Heart attack Mother   . Cancer Father     lung ca  . Hypertension Sister   . Diabetes Brother   . Hypertension Sister   . Diabetes Brother   . Diabetes Brother   . Diabetes Brother   . Diabetes Brother   . Diabetes Daughter     No Known Allergies  Current  Outpatient Prescriptions on File Prior to Visit  Medication Sig Dispense Refill  . aspirin EC 81 MG tablet Take 81 mg by mouth daily.    . betamethasone dipropionate (DIPROLENE) 0.05 % cream Apply 1 application topically 2 (two) times daily as needed (for itching).    . Calcium 600-200 MG-UNIT per tablet Take 1 tablet by mouth daily.    . fluticasone (FLONASE) 50 MCG/ACT nasal spray Place 2 sprays into both nostrils daily as needed for allergies or rhinitis.     . predniSONE (DELTASONE) 5 MG tablet 10 mg in AM and 5 mg PM (Patient taking differently: 7.5 mg in AM and 5 mg PM) 270 tablet 0  . simvastatin (ZOCOR) 40 MG tablet TAKE 1 TABLET DAILY 90 tablet 1  . tamsulosin (FLOMAX) 0.4 MG CAPS capsule TAKE 1 CAPSULE DAILY AFTER SUPPER *WATSON/ACTAVIS* 90 capsule 1   No current facility-administered medications on file prior to visit.    BP 130/70 mmHg  Pulse 81  Temp(Src) 98.1 F (36.7 C) (Oral)  Resp 20  Ht 5' 11.5" (1.816 m)  Wt 181 lb (82.101 kg)  BMI 24.90 kg/m2  SpO2 97%     Review of Systems  Constitutional: Negative for fever, chills, appetite change and fatigue.  HENT: Negative for congestion, dental problem, ear pain, hearing loss, sore throat, tinnitus, trouble swallowing and voice change.   Eyes: Negative for pain, discharge and visual disturbance.  Respiratory: Negative for cough, chest tightness, wheezing and stridor.   Cardiovascular: Negative for chest pain, palpitations and leg swelling.  Gastrointestinal: Negative for nausea, vomiting, abdominal pain, diarrhea, constipation, blood in stool and abdominal distention.  Genitourinary: Negative for urgency, hematuria, flank pain, discharge, difficulty urinating and genital sores.  Musculoskeletal: Positive for arthralgias. Negative for myalgias, back pain, joint swelling, gait problem and neck stiffness.  Skin: Negative for rash.  Neurological: Negative for dizziness, syncope, speech difficulty, weakness, numbness and  headaches.  Hematological: Negative for adenopathy. Does not bruise/bleed easily.  Psychiatric/Behavioral: Negative for behavioral problems and dysphoric mood. The patient is not nervous/anxious.        Objective:   Physical Exam  Constitutional: He is oriented to person, place, and time. He appears well-developed.  HENT:  Head: Normocephalic.  Right Ear: External ear normal.  Left Ear: External ear normal.  Eyes: Conjunctivae and EOM are normal.  Neck: Normal range of motion.  Cardiovascular: Normal rate and normal heart sounds.   Pulmonary/Chest: Breath sounds normal.  Abdominal: Bowel sounds are normal.  Musculoskeletal: Normal range of motion. He exhibits no edema or tenderness.  Neurological: He is alert and oriented to person, place, and time.  Psychiatric: He has a normal mood and affect. His behavior is normal.          Assessment & Plan:   PMR.  Will check a sedimentation rate.  If this is normal.  Will decrease prednisone to 7.5 milligrams in the morning and 2.5 milligrams in the afternoon.  If he remains stable on this regimen.  We'll further decrease to 5 mg in the morning and 2.5 milligrams in the afternoon Left leg claudication, stable  Recheck 4 months

## 2015-02-22 NOTE — Patient Instructions (Signed)
Decrease prednisone to 7.5 milligrams in the morning and 2.5 milligrams in the afternoon  If  you remain stable, in  2 months, further decrease your prednisone to 5 mg in the morning and 2.5 milligrams in the afternoon  Return in 4 months for follow-up

## 2015-04-12 ENCOUNTER — Other Ambulatory Visit: Payer: Self-pay | Admitting: Internal Medicine

## 2015-04-15 ENCOUNTER — Telehealth: Payer: Self-pay | Admitting: *Deleted

## 2015-04-15 MED ORDER — PREDNISONE 2.5 MG PO TABS: ORAL_TABLET | ORAL | Status: DC

## 2015-04-15 NOTE — Telephone Encounter (Signed)
Pt called back, stating he would like 2.5 mg tablets due to hard to cut tablets in half. Told pt refill was done to mail order. Pt said does not do mail order only [facility]. Told pt okay will send Rx for 2.5 mg tablets. Pt verbalized understanding. Rx sent

## 2015-04-15 NOTE — Telephone Encounter (Signed)
Left message on voicemail to call office.  

## 2015-04-15 NOTE — Telephone Encounter (Signed)
Patient requests a refill of prednisone 2.5 [facility] [address]

## 2015-05-08 ENCOUNTER — Telehealth: Payer: Self-pay | Admitting: Internal Medicine

## 2015-05-08 NOTE — Telephone Encounter (Signed)
Please advise if okay to fill Epi pen?

## 2015-05-08 NOTE — Telephone Encounter (Signed)
Pt call to ask if a Epic pen can be call fro him. He said he has an allergic reation a week ago to a bee sting     Pharmacy  [place]

## 2015-05-09 MED ORDER — EPINEPHRINE 0.3 MG/0.3ML IJ SOAJ: 0.3000 mg | Freq: Once | INTRAMUSCULAR | Status: DC

## 2015-05-09 NOTE — Telephone Encounter (Signed)
ok 

## 2015-05-09 NOTE — Telephone Encounter (Signed)
Pt notified Rx for Epi pen sent to pharmacy.

## 2015-06-26 ENCOUNTER — Encounter: Payer: Self-pay | Admitting: Internal Medicine

## 2015-06-26 ENCOUNTER — Ambulatory Visit (INDEPENDENT_AMBULATORY_CARE_PROVIDER_SITE_OTHER): Payer: BLUE CROSS/BLUE SHIELD | Admitting: Internal Medicine

## 2015-06-26 VITALS — BP 120/80 | HR 84 | Temp 98.6°F | Resp 18 | Ht 71.5 in | Wt 175.0 lb

## 2015-06-26 DIAGNOSIS — I739 Peripheral vascular disease, unspecified: Secondary | ICD-10-CM | POA: Diagnosis not present

## 2015-06-26 DIAGNOSIS — I70219 Atherosclerosis of native arteries of extremities with intermittent claudication, unspecified extremity: Secondary | ICD-10-CM

## 2015-06-26 DIAGNOSIS — M353 Polymyalgia rheumatica: Secondary | ICD-10-CM

## 2015-06-26 LAB — SEDIMENTATION RATE: Sed Rate: 32 mm/hr — ABNORMAL HIGH (ref 0–22)

## 2015-06-26 MED ORDER — TAMSULOSIN HCL 0.4 MG PO CAPS: 0.4000 mg | ORAL_CAPSULE | Freq: Every day | ORAL | Status: DC

## 2015-06-26 MED ORDER — SIMVASTATIN 40 MG PO TABS: 40.0000 mg | ORAL_TABLET | Freq: Every day | ORAL | Status: DC

## 2015-06-26 NOTE — Progress Notes (Signed)
Pre visit review using our clinic review tool, if applicable. No additional management support is needed unless otherwise documented below in the visit note. 

## 2015-06-26 NOTE — Progress Notes (Signed)
Subjective:    Patient ID: Brandon Dixon, male    DOB: 05-03-1946, 69 y.o.   MRN: [id number]  HPI  69 year old patient who is seen today in follow-up.  He has left leg claudication which has been stable.  He has very little pain.  He states that if he didn't has shoulder difficulties.  He would be active with tennis He has PMR, which also has been stable.  Last sedimentation rate 4 months ago was normal and prednisone was down titrated to 5 mg in the morning and 2.5 milligrams in the evening.  He feels well on this regimen  Past Medical History  Diagnosis Date  . ALLERGIC RHINITIS 09/26/2007  . BENIGN PROSTATIC HYPERTROPHY 05/27/2007  . COLONIC POLYPS, HX OF 05/27/2007  . DERMATITIS 08/25/2010  . GERD 05/27/2007  . HYPERCHOLESTEROLEMIA 09/26/2007  . Peripheral vascular disease     Social History   Social History  . Marital Status: Married    Spouse Name: N/A  . Number of Children: N/A  . Years of Education: N/A   Occupational History  . Not on file.   Social History Main Topics  . Smoking status: Former Smoker    Quit date: 10/19/2001  . Smokeless tobacco: Never Used  . Alcohol Use: 3.6 oz/week    6 Cans of beer per week  . Drug Use: No  . Sexual Activity: Not on file   Other Topics Concern  . Not on file   Social History Narrative    Past Surgical History  Procedure Laterality Date  . Vasectomy    . Rotator cuff repair    . Lower extremity angiogram Left 04/30/2014    Procedure: LOWER EXTREMITY ANGIOGRAM;  Surgeon: Angelia Mould, MD;  Location: [facility] CATH LAB;  Service: Cardiovascular;  Laterality: Left;    Family History  Problem Relation Age of Onset  . Heart disease Mother   . Heart attack Mother   . Cancer Father     lung ca  . Hypertension Sister   . Diabetes Brother   . Hypertension Sister   . Diabetes Brother   . Diabetes Brother   . Diabetes Brother   . Diabetes Brother   . Diabetes Daughter     No Known Allergies  Current Outpatient  Prescriptions on File Prior to Visit  Medication Sig Dispense Refill  . aspirin EC 81 MG tablet Take 81 mg by mouth daily.    . betamethasone dipropionate (DIPROLENE) 0.05 % cream Apply 1 application topically 2 (two) times daily as needed (for itching).    . Calcium 600-200 MG-UNIT per tablet Take 1 tablet by mouth daily.    Marland Kitchen EPINEPHrine 0.3 mg/0.3 mL IJ SOAJ injection Inject 0.3 mLs (0.3 mg total) into the muscle once. 1 Device 1  . fluticasone (FLONASE) 50 MCG/ACT nasal spray Place 2 sprays into both nostrils daily as needed for allergies or rhinitis.      No current facility-administered medications on file prior to visit.    BP 120/80 mmHg  Pulse 84  Temp(Src) 98.6 F (37 C) (Oral)  Resp 18  Ht 5' 11.5" (1.816 m)  Wt 175 lb (79.379 kg)  BMI 24.07 kg/m2  SpO2 98%     Review of Systems  Constitutional: Negative for fever, chills, appetite change and fatigue.  HENT: Negative for congestion, dental problem, ear pain, hearing loss, sore throat, tinnitus, trouble swallowing and voice change.   Eyes: Negative for pain, discharge and visual disturbance.  Respiratory: Negative  for cough, chest tightness, wheezing and stridor.   Cardiovascular: Negative for chest pain, palpitations and leg swelling.  Gastrointestinal: Negative for nausea, vomiting, abdominal pain, diarrhea, constipation, blood in stool and abdominal distention.  Genitourinary: Negative for urgency, hematuria, flank pain, discharge, difficulty urinating and genital sores.  Musculoskeletal: Negative for myalgias, back pain, joint swelling, arthralgias, gait problem and neck stiffness.  Skin: Negative for rash.  Neurological: Negative for dizziness, syncope, speech difficulty, weakness, numbness and headaches.  Hematological: Negative for adenopathy. Does not bruise/bleed easily.  Psychiatric/Behavioral: Negative for behavioral problems and dysphoric mood. The patient is not nervous/anxious.        Objective:    Physical Exam  Constitutional: He is oriented to person, place, and time. He appears well-developed.  HENT:  Head: Normocephalic.  Right Ear: External ear normal.  Left Ear: External ear normal.  Eyes: Conjunctivae and EOM are normal.  Neck: Normal range of motion.  Cardiovascular: Normal rate and normal heart sounds.   Pulmonary/Chest: Breath sounds normal.  Abdominal: Bowel sounds are normal.  Musculoskeletal: Normal range of motion. He exhibits no edema or tenderness.  Neurological: He is alert and oriented to person, place, and time.  Psychiatric: He has a normal mood and affect. His behavior is normal.          Assessment & Plan:  Left leg claudication, stable Polymyalgia rheumatica.  Will check a sedimentation rate.  Distal normal.  Will decrease to 5 mg daily.  Schedule CPX in 4 months

## 2015-06-26 NOTE — Patient Instructions (Signed)
Limit your sodium (Salt) intake  Please check your blood pressure on a regular basis.  If it is consistently greater than 150/90, please make an office appointment.  Return in 4 months for follow-up  

## 2015-07-02 ENCOUNTER — Other Ambulatory Visit: Payer: Self-pay | Admitting: Internal Medicine

## 2015-07-05 ENCOUNTER — Telehealth: Payer: Self-pay | Admitting: Internal Medicine

## 2015-07-05 NOTE — Telephone Encounter (Signed)
Pt call to say he is having a problem with the new dosage of predisone

## 2015-07-05 NOTE — Telephone Encounter (Signed)
Yes, okay to resume prior dose

## 2015-07-05 NOTE — Telephone Encounter (Signed)
Please see message and advise 

## 2015-07-05 NOTE — Telephone Encounter (Signed)
Spoke to pt, told him to resume prior dose, Prednisone take 5 mg in the morning and 2.5 mg at bedtime. Pt verbalized understanding.

## 2015-07-05 NOTE — Telephone Encounter (Signed)
Spoke to pt, said he is waking up stiff since changed Prednisone dose to 5 mg daily. Wants to know if can split the dose or go back on 2.5 mg at bedtime again. Told pt will send message to Dr.K and get back to him. Pt verbalized understanding.

## 2015-09-25 ENCOUNTER — Other Ambulatory Visit: Payer: Self-pay | Admitting: Internal Medicine

## 2015-09-26 ENCOUNTER — Telehealth: Payer: Self-pay | Admitting: Internal Medicine

## 2015-09-26 NOTE — Telephone Encounter (Signed)
5 mg  #90

## 2015-09-26 NOTE — Telephone Encounter (Signed)
Pt requesting PREDNISONE. i see that theres is 2 different MG in pt medication list   Please advise

## 2015-09-27 MED ORDER — PREDNISONE 5 MG PO TABS: ORAL_TABLET | ORAL | Status: DC

## 2015-09-27 NOTE — Telephone Encounter (Signed)
Rx was sent  

## 2015-10-22 ENCOUNTER — Other Ambulatory Visit: Payer: Self-pay | Admitting: Internal Medicine

## 2015-10-22 MED ORDER — TAMSULOSIN HCL 0.4 MG PO CAPS: 0.4000 mg | ORAL_CAPSULE | Freq: Every day | ORAL | Status: DC

## 2015-10-22 MED ORDER — SIMVASTATIN 40 MG PO TABS: 40.0000 mg | ORAL_TABLET | Freq: Every day | ORAL | Status: DC

## 2015-10-23 MED ORDER — PREDNISONE 5 MG PO TABS: ORAL_TABLET | ORAL | Status: DC

## 2015-10-23 MED ORDER — FLUTICASONE PROPIONATE 50 MCG/ACT NA SUSP: NASAL | Status: DC

## 2015-10-23 MED ORDER — SIMVASTATIN 40 MG PO TABS: 40.0000 mg | ORAL_TABLET | Freq: Every day | ORAL | Status: DC

## 2015-10-23 MED ORDER — TAMSULOSIN HCL 0.4 MG PO CAPS
0.4000 mg | ORAL_CAPSULE | Freq: Every day | ORAL | Status: DC
Start: 2015-10-23 — End: 2015-11-19

## 2015-10-23 NOTE — Addendum Note (Signed)
Addended by: Vergia Alberts C on: 10/23/2015 12:00 PM   Modules accepted: Orders, Medications

## 2015-10-23 NOTE — Addendum Note (Signed)
Addended by: Colleen Can on: 10/23/2015 11:43 AM   Modules accepted: Orders

## 2015-10-24 DIAGNOSIS — S46011D Strain of muscle(s) and tendon(s) of the rotator cuff of right shoulder, subsequent encounter: Secondary | ICD-10-CM | POA: Diagnosis not present

## 2015-10-30 DIAGNOSIS — S46011D Strain of muscle(s) and tendon(s) of the rotator cuff of right shoulder, subsequent encounter: Secondary | ICD-10-CM | POA: Diagnosis not present

## 2015-11-01 DIAGNOSIS — S46011D Strain of muscle(s) and tendon(s) of the rotator cuff of right shoulder, subsequent encounter: Secondary | ICD-10-CM | POA: Diagnosis not present

## 2015-11-04 DIAGNOSIS — S46011D Strain of muscle(s) and tendon(s) of the rotator cuff of right shoulder, subsequent encounter: Secondary | ICD-10-CM | POA: Diagnosis not present

## 2015-11-08 DIAGNOSIS — S46011D Strain of muscle(s) and tendon(s) of the rotator cuff of right shoulder, subsequent encounter: Secondary | ICD-10-CM | POA: Diagnosis not present

## 2015-11-12 DIAGNOSIS — S46011D Strain of muscle(s) and tendon(s) of the rotator cuff of right shoulder, subsequent encounter: Secondary | ICD-10-CM | POA: Diagnosis not present

## 2015-11-14 DIAGNOSIS — S46011D Strain of muscle(s) and tendon(s) of the rotator cuff of right shoulder, subsequent encounter: Secondary | ICD-10-CM | POA: Diagnosis not present

## 2015-11-19 ENCOUNTER — Ambulatory Visit (INDEPENDENT_AMBULATORY_CARE_PROVIDER_SITE_OTHER): Payer: Commercial Managed Care - HMO | Admitting: Internal Medicine

## 2015-11-19 ENCOUNTER — Encounter: Payer: Self-pay | Admitting: Internal Medicine

## 2015-11-19 VITALS — BP 130/82 | HR 78 | Temp 98.2°F | Resp 20 | Ht 71.0 in | Wt 175.0 lb

## 2015-11-19 DIAGNOSIS — S46011D Strain of muscle(s) and tendon(s) of the rotator cuff of right shoulder, subsequent encounter: Secondary | ICD-10-CM | POA: Diagnosis not present

## 2015-11-19 DIAGNOSIS — I70212 Atherosclerosis of native arteries of extremities with intermittent claudication, left leg: Secondary | ICD-10-CM

## 2015-11-19 DIAGNOSIS — Z86010 Personal history of colonic polyps: Secondary | ICD-10-CM | POA: Diagnosis not present

## 2015-11-19 DIAGNOSIS — E7800 Pure hypercholesterolemia, unspecified: Secondary | ICD-10-CM | POA: Diagnosis not present

## 2015-11-19 DIAGNOSIS — M353 Polymyalgia rheumatica: Secondary | ICD-10-CM

## 2015-11-19 DIAGNOSIS — Z0000 Encounter for general adult medical examination without abnormal findings: Secondary | ICD-10-CM

## 2015-11-19 LAB — CBC WITH DIFFERENTIAL/PLATELET
Basophils Absolute: 0 10*3/uL (ref 0.0–0.1)
Basophils Relative: 0.4 % (ref 0.0–3.0)
Eosinophils Absolute: 0.3 10*3/uL (ref 0.0–0.7)
Eosinophils Relative: 3.1 % (ref 0.0–5.0)
HCT: 46.5 % (ref 39.0–52.0)
Hemoglobin: 15.2 g/dL (ref 13.0–17.0)
Lymphocytes Relative: 22 % (ref 12.0–46.0)
Lymphs Abs: 2.3 10*3/uL (ref 0.7–4.0)
MCHC: 32.8 g/dL (ref 30.0–36.0)
MCV: 88.4 fl (ref 78.0–100.0)
Monocytes Absolute: 0.8 10*3/uL (ref 0.1–1.0)
Monocytes Relative: 7.7 % (ref 3.0–12.0)
Neutro Abs: 7.1 10*3/uL (ref 1.4–7.7)
Neutrophils Relative %: 66.8 % (ref 43.0–77.0)
Platelets: 249 10*3/uL (ref 150.0–400.0)
RBC: 5.26 Mil/uL (ref 4.22–5.81)
RDW: 14.8 % (ref 11.5–15.5)
WBC: 10.6 10*3/uL — ABNORMAL HIGH (ref 4.0–10.5)

## 2015-11-19 LAB — COMPREHENSIVE METABOLIC PANEL
ALT: 16 U/L (ref 0–53)
AST: 20 U/L (ref 0–37)
Albumin: 4.1 g/dL (ref 3.5–5.2)
Alkaline Phosphatase: 84 U/L (ref 39–117)
BUN: 15 mg/dL (ref 6–23)
CO2: 33 mEq/L — ABNORMAL HIGH (ref 19–32)
Calcium: 10.1 mg/dL (ref 8.4–10.5)
Chloride: 102 mEq/L (ref 96–112)
Creatinine, Ser: 0.86 mg/dL (ref 0.40–1.50)
GFR: 113.2 mL/min (ref >60.00)
Glucose, Bld: 88 mg/dL (ref 70–99)
Potassium: 4.6 mEq/L (ref 3.5–5.1)
Sodium: 141 mEq/L (ref 135–145)
Total Bilirubin: 0.3 mg/dL (ref 0.2–1.2)
Total Protein: 7.7 g/dL (ref 6.0–8.3)

## 2015-11-19 LAB — LIPID PANEL
Cholesterol: 185 mg/dL (ref 0–200)
HDL: 84.3 mg/dL (ref >39.00)
LDL Cholesterol: 86 mg/dL (ref 0–99)
NonHDL: 100.42
Total CHOL/HDL Ratio: 2
Triglycerides: 73 mg/dL (ref 0.0–149.0)
VLDL: 14.6 mg/dL (ref 0.0–40.0)

## 2015-11-19 LAB — SEDIMENTATION RATE: Sed Rate: 13 mm/hr (ref 0–22)

## 2015-11-19 LAB — TSH: TSH: 1.49 u[IU]/mL (ref 0.35–4.50)

## 2015-11-19 MED ORDER — SIMVASTATIN 40 MG PO TABS: 40.0000 mg | ORAL_TABLET | Freq: Every day | ORAL | Status: DC

## 2015-11-19 MED ORDER — TAMSULOSIN HCL 0.4 MG PO CAPS: 0.4000 mg | ORAL_CAPSULE | Freq: Every day | ORAL | Status: DC

## 2015-11-19 MED ORDER — PREDNISONE 2.5 MG PO TABS: 2.5000 mg | ORAL_TABLET | Freq: Two times a day (BID) | ORAL | Status: DC

## 2015-11-19 NOTE — Patient Instructions (Signed)
Limit your sodium (Salt) intake    It is important that you exercise regularly, at least 20 minutes 3 to 4 times per week.  If you develop chest pain or shortness of breath seek  medical attention.  Schedule your colonoscopy to help detect colon cancer.  Return in 4 months for follow-up

## 2015-11-19 NOTE — Progress Notes (Signed)
Pre visit review using our clinic review tool, if applicable. No additional management support is needed unless otherwise documented below in the visit note. 

## 2015-11-19 NOTE — Progress Notes (Signed)
Patient ID: Brandon Dixon, male   DOB: Feb 24, 1946, 70 y.o.   MRN: [id number]  Subjective:    Patient ID: Brandon Dixon, male    DOB: December 28, 1945, 70 y.o.   MRN: [id number]  HPI 70  year-old patient who is seen today for a wellness exam;    medical problems include dyslipidemia mild BPH and allergic rhinitis.  He has been disabled since 2006 following left shoulder surgery.  Patient is doing quite well.   He has been evaluated and treated for claudication involving the left leg and treated for polymyalgia rheumatica.  He remains quite stable.  No left leg claudication.  Also walks 2 miles without difficulties.  PMR symptoms are also stable on present prednisone dose  Here for Medicare AWV:  1. Risk factors based on Past M, S, F history: cardiovascular risk factors include dyslipidemia  2. Physical Activities: remains quite active;  3. Depression/mood: no h/o  depression and mood disorder  4. Hearing: no hearing deficits  5. ADL's: independent in all aspects of daily living  6. Fall Risk: low  7. Home Safety: no problems identified  8. Height, weight, &visual acuity:height and weight stable. No difficulty with visual acuity  9. Counseling: heart healthy diet, salt restricted diet. All encouraged  10. Labs ordered based on risk factors:laboratory profile, including lipid panel will be reviewed  11. Referral Coordination- not appropriate at this time. will need colonoscopy this year 29. Care Plan- heart healthy diet more regular and vigorous exercise. Encouraged  13. Cognitive Assessment- alert and oriented, with normal affect  14.  Preventive services will include annual health exams.  He will be seen once or twice annually by vascular surgery.  Colonoscopies every 5 years. Patient was provided with a written and personalized care plan 15.  Provider list update includes primary care.  GI,  vascular surgery     Allergies (verified):  No Known Drug Allergies   Past History:  Past Medical  History:    Colonic polyps, hx of  GERD  Benign prostatic hypertrophy  Allergic rhinitis/septal deviation  Left leg claudication Polymyalgia rheumatica  Past Surgical History:    Rotator cuff repair  Vasectomy  colonoscopy November 2005, January 2011 Dr. Collene Mares , April 2016 stress Myoview April 2009   Family History:   father died at 54, lung cancer  mother died at 42. CAD  5 brothers, positive diabetes ; one brother with recent diagnosis of pancreatic cancer (2013) two sisters positive for hypertension   Social History:   Married  3 daughters     Review of Systems  Constitutional: Negative for fever, chills, activity change, appetite change and fatigue.  HENT: Negative for congestion, dental problem, ear pain, hearing loss, mouth sores, rhinorrhea, sinus pressure, sneezing, tinnitus, trouble swallowing and voice change.   Eyes: Negative for photophobia, pain, redness and visual disturbance.  Respiratory: Negative for apnea, cough, choking, chest tightness, shortness of breath and wheezing.   Cardiovascular: Negative for chest pain, palpitations and leg swelling.  Gastrointestinal: Negative for nausea, vomiting, abdominal pain, diarrhea, constipation, blood in stool, abdominal distention, anal bleeding and rectal pain.  Genitourinary: Negative for dysuria, urgency, frequency, hematuria, flank pain, decreased urine volume, discharge, penile swelling, scrotal swelling, difficulty urinating, genital sores and testicular pain.  Musculoskeletal: Negative for myalgias, back pain, joint swelling, arthralgias, gait problem, neck pain and neck stiffness.  Skin: Negative for color change, rash and wound.  Neurological: Negative for dizziness, tremors, seizures, syncope, facial asymmetry, speech difficulty, weakness, light-headedness,  numbness and headaches.  Hematological: Negative for adenopathy. Does not bruise/bleed easily.  Psychiatric/Behavioral: Negative for suicidal ideas,  hallucinations, behavioral problems, confusion, sleep disturbance, self-injury, dysphoric mood, decreased concentration and agitation. The patient is not nervous/anxious.   been     Objective:   Physical Exam  Constitutional: He appears well-developed and well-nourished.  Repeat blood pressure 120/80 in both arms  HENT:  Head: Normocephalic and atraumatic.  Right Ear: External ear normal.  Left Ear: External ear normal.  Nose: Nose normal.  Mouth/Throat: Oropharynx is clear and moist.  Congested turbinates  Eyes: Conjunctivae and EOM are normal. Pupils are equal, round, and reactive to light. No scleral icterus.  Neck: Normal range of motion. Neck supple. No JVD present. No thyromegaly present.  Cardiovascular: Regular rhythm and normal heart sounds.  Exam reveals no gallop and no friction rub.   No murmur heard. Pedal pulses absent except for a full right dorsalis pedis pulse  Pulmonary/Chest: Effort normal and breath sounds normal. He exhibits no tenderness.  Suggestion of mild gynecomastia  Abdominal: Soft. Bowel sounds are normal. He exhibits no distension and no mass. There is no tenderness.  Genitourinary: Penis normal.  +2  enlarged  Musculoskeletal: Normal range of motion. He exhibits no edema or tenderness.  Lymphadenopathy:    He has no cervical adenopathy.  Neurological: He is alert. He has normal reflexes. No cranial nerve deficit. Coordination normal.  Skin: Skin is warm and dry. No rash noted.  Mild digital clubbing  Psychiatric: He has a normal mood and affect. His behavior is normal.          Assessment & Plan:  Preventive health examination History colonic polyps.  Continue colonoscopies at five-year intervals (Dr Collene Mares) Dyslipidemia Allergic rhinitis.  Symptomatic BPH.  Left leg claudication.  Stable.  Continue aggressive risk factor modification PMR.  Will check a sedimentation rate.  Continue efforts to taper and discontinue prednisone therapy     Laboratory profile reviewed  Regular exercise regimen encouraged  Heart healthy diet encouraged  Continue statin therapy

## 2015-11-20 LAB — HEPATITIS C ANTIBODY: HCV Ab: NEGATIVE

## 2015-11-21 DIAGNOSIS — S46011D Strain of muscle(s) and tendon(s) of the rotator cuff of right shoulder, subsequent encounter: Secondary | ICD-10-CM | POA: Diagnosis not present

## 2015-11-27 ENCOUNTER — Ambulatory Visit: Payer: Medicare Other | Admitting: Family

## 2015-11-27 ENCOUNTER — Inpatient Hospital Stay (HOSPITAL_COMMUNITY)
Admission: RE | Admit: 2015-11-27 | Discharge: 2015-11-27 | Disposition: A | Discharge status: Home / Self Care | Payer: Medicare Other | Source: Ambulatory Visit | Attending: Vascular Surgery | Admitting: Vascular Surgery

## 2015-11-27 DIAGNOSIS — I739 Peripheral vascular disease, unspecified: Secondary | ICD-10-CM

## 2015-11-27 DIAGNOSIS — S46011D Strain of muscle(s) and tendon(s) of the rotator cuff of right shoulder, subsequent encounter: Secondary | ICD-10-CM | POA: Diagnosis not present

## 2015-12-04 DIAGNOSIS — S46011D Strain of muscle(s) and tendon(s) of the rotator cuff of right shoulder, subsequent encounter: Secondary | ICD-10-CM | POA: Diagnosis not present

## 2015-12-06 DIAGNOSIS — S46011D Strain of muscle(s) and tendon(s) of the rotator cuff of right shoulder, subsequent encounter: Secondary | ICD-10-CM | POA: Diagnosis not present

## 2015-12-11 DIAGNOSIS — S46011D Strain of muscle(s) and tendon(s) of the rotator cuff of right shoulder, subsequent encounter: Secondary | ICD-10-CM | POA: Diagnosis not present

## 2015-12-13 DIAGNOSIS — S46011D Strain of muscle(s) and tendon(s) of the rotator cuff of right shoulder, subsequent encounter: Secondary | ICD-10-CM | POA: Diagnosis not present

## 2015-12-18 DIAGNOSIS — S46011D Strain of muscle(s) and tendon(s) of the rotator cuff of right shoulder, subsequent encounter: Secondary | ICD-10-CM | POA: Diagnosis not present

## 2015-12-25 DIAGNOSIS — S46011D Strain of muscle(s) and tendon(s) of the rotator cuff of right shoulder, subsequent encounter: Secondary | ICD-10-CM | POA: Diagnosis not present

## 2015-12-27 DIAGNOSIS — Z4789 Encounter for other orthopedic aftercare: Secondary | ICD-10-CM | POA: Diagnosis not present

## 2016-01-03 DIAGNOSIS — S46011D Strain of muscle(s) and tendon(s) of the rotator cuff of right shoulder, subsequent encounter: Secondary | ICD-10-CM | POA: Diagnosis not present

## 2016-01-10 DIAGNOSIS — S46011D Strain of muscle(s) and tendon(s) of the rotator cuff of right shoulder, subsequent encounter: Secondary | ICD-10-CM | POA: Diagnosis not present

## 2016-01-16 DIAGNOSIS — S46011D Strain of muscle(s) and tendon(s) of the rotator cuff of right shoulder, subsequent encounter: Secondary | ICD-10-CM | POA: Diagnosis not present

## 2016-01-23 DIAGNOSIS — S46011D Strain of muscle(s) and tendon(s) of the rotator cuff of right shoulder, subsequent encounter: Secondary | ICD-10-CM | POA: Diagnosis not present

## 2016-01-30 ENCOUNTER — Telehealth: Payer: Self-pay | Admitting: Internal Medicine

## 2016-01-30 NOTE — Telephone Encounter (Signed)
Please see message and advise 

## 2016-01-30 NOTE — Telephone Encounter (Signed)
Pt is on prednisone 2.5 mg twice a day and he would like to cut one of dosage to half. Pt is aware md out of office until monday

## 2016-02-03 NOTE — Telephone Encounter (Signed)
Okay to decrease dose to every morning only Follow-up office visit 1 month

## 2016-02-03 NOTE — Telephone Encounter (Signed)
Left detailed message on personal voicemail, Dr.K said okay to decrease dose to every morning only and follow up office visit 1 month. Any questions please call office.

## 2016-02-18 DIAGNOSIS — H5210 Myopia, unspecified eye: Secondary | ICD-10-CM | POA: Diagnosis not present

## 2016-03-17 ENCOUNTER — Encounter: Payer: Self-pay | Admitting: Internal Medicine

## 2016-03-17 ENCOUNTER — Ambulatory Visit (INDEPENDENT_AMBULATORY_CARE_PROVIDER_SITE_OTHER): Payer: Commercial Managed Care - HMO | Admitting: Internal Medicine

## 2016-03-17 VITALS — BP 124/78 | HR 71 | Temp 97.7°F | Ht 71.0 in | Wt 176.0 lb

## 2016-03-17 DIAGNOSIS — M545 Low back pain, unspecified: Secondary | ICD-10-CM

## 2016-03-17 DIAGNOSIS — E7800 Pure hypercholesterolemia, unspecified: Secondary | ICD-10-CM | POA: Diagnosis not present

## 2016-03-17 DIAGNOSIS — M353 Polymyalgia rheumatica: Secondary | ICD-10-CM | POA: Diagnosis not present

## 2016-03-17 DIAGNOSIS — I739 Peripheral vascular disease, unspecified: Secondary | ICD-10-CM

## 2016-03-17 LAB — SEDIMENTATION RATE: Sed Rate: 36 mm/hr — ABNORMAL HIGH (ref 0–20)

## 2016-03-17 MED ORDER — TRIAMCINOLONE ACETONIDE 0.1 % EX CREA: 1.0000 "application " | TOPICAL_CREAM | Freq: Two times a day (BID) | CUTANEOUS | Status: DC

## 2016-03-17 MED ORDER — ATORVASTATIN CALCIUM 80 MG PO TABS: 80.0000 mg | ORAL_TABLET | Freq: Every day | ORAL | Status: DC

## 2016-03-17 MED ORDER — PREDNISONE 2.5 MG PO TABS: ORAL_TABLET | ORAL | Status: DC

## 2016-03-17 NOTE — Progress Notes (Signed)
Pre visit review using our clinic review tool, if applicable. No additional management support is needed unless otherwise documented below in the visit note. 

## 2016-03-17 NOTE — Patient Instructions (Signed)
Prednisone 2.5 milligrams in the morning and 1 and a quarter milligram in the afternoon    It is important that you exercise regularly, at least 20 minutes 3 to 4 times per week.  If you develop chest pain or shortness of breath seek  medical attention.  Limit your sodium (Salt) intake  Return in 6 months for follow-up

## 2016-03-17 NOTE — Progress Notes (Signed)
Subjective:    Patient ID: Brandon Dixon, male    DOB: 07-26-1946, 70 y.o.   MRN: [id number]  HPI 70 year old patient who is seen in follow-up.  He has a history of PAD and dyslipidemia.  LDL cholesterol was 79 on treatment with 40 mg of simvastatin. Complaints the include some right hip pain.  This was bothersome earlier on the left side but now is more bothersome on the right.  Pain is fairly minor and he has taken no medications He has stable left leg claudication He also has a history of polymyalgia rheumatica.  He has the decrease prednisone to 1.25 milligrams in the morning and 2.5 milligrams in the afternoon.  Past Medical History  Diagnosis Date  . ALLERGIC RHINITIS 09/26/2007  . BENIGN PROSTATIC HYPERTROPHY 05/27/2007  . COLONIC POLYPS, HX OF 05/27/2007  . DERMATITIS 08/25/2010  . GERD 05/27/2007  . HYPERCHOLESTEROLEMIA 09/26/2007  . Peripheral vascular disease [facility])      Social History   Social History  . Marital Status: Married    Spouse Name: N/A  . Number of Children: N/A  . Years of Education: N/A   Occupational History  . Not on file.   Social History Main Topics  . Smoking status: Former Smoker    Quit date: 10/19/2001  . Smokeless tobacco: Never Used  . Alcohol Use: 3.6 oz/week    6 Cans of beer per week  . Drug Use: No  . Sexual Activity: Not on file   Other Topics Concern  . Not on file   Social History Narrative    Past Surgical History  Procedure Laterality Date  . Vasectomy    . Rotator cuff repair    . Lower extremity angiogram Left 04/30/2014    Procedure: LOWER EXTREMITY ANGIOGRAM;  Surgeon: Angelia Mould, MD;  Location: [facility] CATH LAB;  Service: Cardiovascular;  Laterality: Left;    Family History  Problem Relation Age of Onset  . Heart disease Mother   . Heart attack Mother   . Cancer Father     lung ca  . Hypertension Sister   . Diabetes Brother   . Hypertension Sister   . Diabetes Brother   . Diabetes Brother   . Diabetes  Brother   . Diabetes Brother   . Diabetes Daughter     No Known Allergies  Current Outpatient Prescriptions on File Prior to Visit  Medication Sig Dispense Refill  . aspirin EC 81 MG tablet Take 81 mg by mouth daily.    . Calcium 600-200 MG-UNIT per tablet Take 1 tablet by mouth daily.    Marland Kitchen EPINEPHrine 0.3 mg/0.3 mL IJ SOAJ injection Inject 0.3 mLs (0.3 mg total) into the muscle once. 1 Device 1  . fluticasone (FLONASE) 50 MCG/ACT nasal spray PLACE 2 SPRAYS INTO BOTH NOSTRILS DAILY. 48 g 1  . tamsulosin (FLOMAX) 0.4 MG CAPS capsule Take 1 capsule (0.4 mg total) by mouth daily after supper. 90 capsule 3   No current facility-administered medications on file prior to visit.    BP 124/78 mmHg  Pulse 71  Temp(Src) 97.7 F (36.5 C) (Oral)  Ht 5\' 11"  (1.803 m)  Wt 176 lb (79.833 kg)  BMI 24.56 kg/m2  SpO2 97%       Review of Systems  Constitutional: Negative for fever, chills, appetite change and fatigue.  HENT: Negative for congestion, dental problem, ear pain, hearing loss, sore throat, tinnitus, trouble swallowing and voice change.   Eyes: Negative for pain,  discharge and visual disturbance.  Respiratory: Negative for cough, chest tightness, wheezing and stridor.   Cardiovascular: Negative for chest pain, palpitations and leg swelling.  Gastrointestinal: Negative for nausea, vomiting, abdominal pain, diarrhea, constipation, blood in stool and abdominal distention.  Genitourinary: Negative for urgency, hematuria, flank pain, discharge, difficulty urinating and genital sores.  Musculoskeletal: Positive for back pain. Negative for myalgias, joint swelling, arthralgias, gait problem and neck stiffness.  Skin: Negative for rash.  Neurological: Negative for dizziness, syncope, speech difficulty, weakness, numbness and headaches.  Hematological: Negative for adenopathy. Does not bruise/bleed easily.  Psychiatric/Behavioral: Negative for behavioral problems and dysphoric mood. The  patient is not nervous/anxious.        Objective:   Physical Exam  Constitutional: He is oriented to person, place, and time. He appears well-developed.  Blood pressure low normal  HENT:  Head: Normocephalic.  Right Ear: External ear normal.  Left Ear: External ear normal.  Eyes: Conjunctivae and EOM are normal.  Neck: Normal range of motion.  Cardiovascular: Normal rate and normal heart sounds.   Pulmonary/Chest: Breath sounds normal.  Abdominal: Bowel sounds are normal.  Musculoskeletal: Normal range of motion. He exhibits no edema or tenderness.  Mild tenderness over the right posterior superior iliac crest area Full range of motion of the right hip without pain  Neurological: He is alert and oriented to person, place, and time.  Psychiatric: He has a normal mood and affect. His behavior is normal.          Assessment & Plan:  Dyslipidemia.  Will discontinue simvastatin and start atorvastatin 80 mg daily Left leg claudication, stable Polymyalgia rheumatica.  Will check a sedimentation rate.  Will change prednisone regimen to 2.5 milligrams in the morning and 1.25 milligrams in the afternoon Right-sided low back pain.  Pain is minor.  He had similar pain in the left which has resolved.  Will observe at this point   Nyoka Cowden, MD

## 2016-03-25 ENCOUNTER — Other Ambulatory Visit: Payer: Self-pay | Admitting: *Deleted

## 2016-03-25 MED ORDER — PREDNISONE 2.5 MG PO TABS
ORAL_TABLET | ORAL | Status: DC
Start: 2016-03-25 — End: 2017-06-17

## 2016-03-26 ENCOUNTER — Telehealth: Payer: Self-pay | Admitting: Internal Medicine

## 2016-03-26 MED ORDER — TRIAMCINOLONE ACETONIDE 0.1 % EX CREA: 1.0000 "application " | TOPICAL_CREAM | Freq: Two times a day (BID) | CUTANEOUS | Status: DC

## 2016-03-26 NOTE — Telephone Encounter (Signed)
Pharmist Brad with [employer] called and pt Rx for triamcinolone cream (KENALOG) 0.1% Script was written for 60 day with 2 refills and the pt pays for 30 days.  Pharmacy would like to rewrite the Rx for 180 day with no refills and the pt would not have a copay.  Would it be okay to rewrite the Rx for 180 days with no refill?  You can call Brad at [phone] Ext: [phone] and leave detail msg on secure line if not able to speak with him.

## 2016-03-26 NOTE — Telephone Encounter (Signed)
New Rx sent to pharmacy as requested

## 2016-05-11 ENCOUNTER — Telehealth: Payer: Self-pay | Admitting: Internal Medicine

## 2016-05-11 ENCOUNTER — Ambulatory Visit (INDEPENDENT_AMBULATORY_CARE_PROVIDER_SITE_OTHER): Payer: Commercial Managed Care - HMO | Admitting: Internal Medicine

## 2016-05-11 ENCOUNTER — Encounter: Payer: Self-pay | Admitting: Internal Medicine

## 2016-05-11 VITALS — BP 120/70 | HR 90 | Temp 98.0°F | Ht 71.0 in | Wt 173.0 lb

## 2016-05-11 DIAGNOSIS — T148 Other injury of unspecified body region: Secondary | ICD-10-CM

## 2016-05-11 DIAGNOSIS — M353 Polymyalgia rheumatica: Secondary | ICD-10-CM

## 2016-05-11 DIAGNOSIS — I739 Peripheral vascular disease, unspecified: Secondary | ICD-10-CM

## 2016-05-11 DIAGNOSIS — W57XXXA Bitten or stung by nonvenomous insect and other nonvenomous arthropods, initial encounter: Secondary | ICD-10-CM | POA: Diagnosis not present

## 2016-05-11 MED ORDER — DOXYCYCLINE HYCLATE 100 MG PO TABS: 100.0000 mg | ORAL_TABLET | Freq: Two times a day (BID) | ORAL | 0 refills | Status: DC

## 2016-05-11 NOTE — Progress Notes (Signed)
Subjective:    Patient ID: Brandon Dixon, male    DOB: 06/03/1946, 70 y.o.   MRN: [id number]  HPI 70 year old patient presents with a chief complaint of a tick bite involving his right lateral thigh area.  He removed the tick about 3 weeks ago, but he has had persistent local tenderness and soft tissue swelling. He also has had a rash involving the neck area anteriorly.  This has been nonpruritic and he has been using topical steroids He has PMR, which has been stable.  Remains on a prednisone regimen of 2.5 milligrams in the morning and 1.25 milligrams in the afternoon. He has left leg claudication which has been stable.  Past Medical History:  Diagnosis Date  . ALLERGIC RHINITIS 09/26/2007  . BENIGN PROSTATIC HYPERTROPHY 05/27/2007  . COLONIC POLYPS, HX OF 05/27/2007  . DERMATITIS 08/25/2010  . GERD 05/27/2007  . HYPERCHOLESTEROLEMIA 09/26/2007  . Peripheral vascular disease [facility])      Social History   Social History  . Marital status: Married    Spouse name: N/A  . Number of children: N/A  . Years of education: N/A   Occupational History  . Not on file.   Social History Main Topics  . Smoking status: Former Smoker    Quit date: 10/19/2001  . Smokeless tobacco: Never Used  . Alcohol use 3.6 oz/week    6 Cans of beer per week  . Drug use: No  . Sexual activity: Not on file   Other Topics Concern  . Not on file   Social History Narrative  . No narrative on file    Past Surgical History:  Procedure Laterality Date  . LOWER EXTREMITY ANGIOGRAM Left 04/30/2014   Procedure: LOWER EXTREMITY ANGIOGRAM;  Surgeon: Angelia Mould, MD;  Location: [facility] CATH LAB;  Service: Cardiovascular;  Laterality: Left;  . ROTATOR CUFF REPAIR    . VASECTOMY      Family History  Problem Relation Age of Onset  . Heart disease Mother   . Heart attack Mother   . Cancer Father     lung ca  . Hypertension Sister   . Diabetes Brother   . Hypertension Sister   . Diabetes Brother   .  Diabetes Brother   . Diabetes Brother   . Diabetes Brother   . Diabetes Daughter     No Known Allergies  Current Outpatient Prescriptions on File Prior to Visit  Medication Sig Dispense Refill  . aspirin EC 81 MG tablet Take 81 mg by mouth daily.    Marland Kitchen atorvastatin (LIPITOR) 80 MG tablet Take 1 tablet (80 mg total) by mouth daily. 90 tablet 3  . Calcium 600-200 MG-UNIT per tablet Take 1 tablet by mouth daily.    Marland Kitchen EPINEPHrine 0.3 mg/0.3 mL IJ SOAJ injection Inject 0.3 mLs (0.3 mg total) into the muscle once. 1 Device 1  . fluticasone (FLONASE) 50 MCG/ACT nasal spray PLACE 2 SPRAYS INTO BOTH NOSTRILS DAILY. 48 g 1  . predniSONE (DELTASONE) 2.5 MG tablet TAKE ONE TABLET(2.5 MG) IN THE MORNING AND HALF A TABLET (1.25 MG) IN THE AFTERNOON. 180 tablet 3  . tamsulosin (FLOMAX) 0.4 MG CAPS capsule Take 1 capsule (0.4 mg total) by mouth daily after supper. 90 capsule 3  . triamcinolone cream (KENALOG) 0.1 % Apply 1 application topically 2 (two) times daily. 180 g 1   No current facility-administered medications on file prior to visit.     BP 120/70   Pulse 90  Temp 98 F (36.7 C) (Oral)   Ht 5\' 11"  (1.803 m)   Wt 173 lb (78.5 kg)   SpO2 98%   BMI 24.13 kg/m      Review of Systems  Constitutional: Negative for appetite change, chills, fatigue and fever.  HENT: Negative for congestion, dental problem, ear pain, hearing loss, sore throat, tinnitus, trouble swallowing and voice change.   Eyes: Negative for pain, discharge and visual disturbance.  Respiratory: Negative for cough, chest tightness, wheezing and stridor.   Cardiovascular: Negative for chest pain, palpitations and leg swelling.       No claudication  Gastrointestinal: Negative for abdominal distention, abdominal pain, blood in stool, constipation, diarrhea, nausea and vomiting.  Genitourinary: Negative for difficulty urinating, discharge, flank pain, genital sores, hematuria and urgency.  Musculoskeletal: Negative for  arthralgias, back pain, gait problem, joint swelling, myalgias and neck stiffness.  Skin: Positive for rash.  Neurological: Negative for dizziness, syncope, speech difficulty, weakness, numbness and headaches.  Hematological: Negative for adenopathy. Does not bruise/bleed easily.  Psychiatric/Behavioral: Negative for behavioral problems and dysphoric mood. The patient is not nervous/anxious.        Objective:   Physical Exam  Constitutional: No distress.  Skin:  A small 1 cm nodule present in the right lateral upper thigh area.  There was some surrounding soft tissue swelling.  No fluctuance center the nodule had a small 2 mm healing crust but no foreign body could be identified  He has some patchy areas of hypopigmentation involving the anterior neck area          Assessment & Plan:   Tick bite, right lateral thigh with some local inflammatory changes.  Will treat with doxycycline for mild cellulitis and for prophylaxis Nonspecific rash involving the anterior neck consistent with a tinea.  Will treat with the Lamisil cream PMR.  Check sedimentation rate in November Left leg claudication, stable  Nyoka Cowden, MD

## 2016-05-11 NOTE — Patient Instructions (Signed)
Apply Lamisil cream to the rash of the neck area twice daily  Call or return to clinic prn if these symptoms worsen or fail to improve as anticipated.  Take your antibiotic as prescribed until ALL of it is gone, but stop if you develop a rash, swelling, or any side effects of the medication.  Contact our office as soon as possible if  there are side effects of the medication.

## 2016-05-11 NOTE — Progress Notes (Signed)
Pre visit review using our clinic review tool, if applicable. No additional management support is needed unless otherwise documented below in the visit note. 

## 2016-05-11 NOTE — Telephone Encounter (Signed)
Pt has a new pharm [facility] doxycycline

## 2016-05-12 MED ORDER — DOXYCYCLINE HYCLATE 100 MG PO TABS: 100.0000 mg | ORAL_TABLET | Freq: Two times a day (BID) | ORAL | 0 refills | Status: DC

## 2016-05-12 NOTE — Telephone Encounter (Signed)
Rx resent to [facility] on [address]. Pharmacy updated in epic.

## 2016-05-12 NOTE — Telephone Encounter (Signed)
Pt would like you to resend the  doxycycline (VIBRA-TABS) 100 MG tablet  to [facility]   Please change pharm for quick pick up in epic to reflect [facility]  Please keep all maintenance med to [facility]

## 2016-06-05 DIAGNOSIS — L810 Postinflammatory hyperpigmentation: Secondary | ICD-10-CM | POA: Diagnosis not present

## 2016-06-05 DIAGNOSIS — L219 Seborrheic dermatitis, unspecified: Secondary | ICD-10-CM | POA: Diagnosis not present

## 2016-06-05 DIAGNOSIS — B360 Pityriasis versicolor: Secondary | ICD-10-CM | POA: Diagnosis not present

## 2016-09-08 ENCOUNTER — Ambulatory Visit (INDEPENDENT_AMBULATORY_CARE_PROVIDER_SITE_OTHER): Payer: Commercial Managed Care - HMO | Admitting: Internal Medicine

## 2016-09-08 ENCOUNTER — Encounter: Payer: Self-pay | Admitting: Internal Medicine

## 2016-09-08 ENCOUNTER — Ambulatory Visit: Payer: Commercial Managed Care - HMO | Admitting: Internal Medicine

## 2016-09-08 VITALS — BP 130/68 | HR 74 | Temp 98.0°F | Ht 71.0 in | Wt 169.8 lb

## 2016-09-08 DIAGNOSIS — M353 Polymyalgia rheumatica: Secondary | ICD-10-CM

## 2016-09-08 DIAGNOSIS — I70212 Atherosclerosis of native arteries of extremities with intermittent claudication, left leg: Secondary | ICD-10-CM | POA: Diagnosis not present

## 2016-09-08 DIAGNOSIS — E7800 Pure hypercholesterolemia, unspecified: Secondary | ICD-10-CM | POA: Diagnosis not present

## 2016-09-08 LAB — SEDIMENTATION RATE: Sed Rate: 39 mm/hr — ABNORMAL HIGH (ref 0–20)

## 2016-09-08 NOTE — Progress Notes (Signed)
Pre visit review using our clinic review tool, if applicable. No additional management support is needed unless otherwise documented below in the visit note. 

## 2016-09-08 NOTE — Patient Instructions (Signed)
Limit your sodium (Salt) intake    It is important that you exercise regularly, at least 20 minutes 3 to 4 times per week.  If you develop chest pain or shortness of breath seek  medical attention.  Annual exam as scheduled

## 2016-09-08 NOTE — Progress Notes (Signed)
Subjective:    Patient ID: Brandon Dixon, male    DOB: 07-Jan-1946, 70 y.o.   MRN: [id number]  HPI  70 year old patient who has a history of claudication involving the left leg.  He also has a history of PMR  This remains controlled on prednisone 2.5 milligrams in the morning and 1.25 milligrams in the evening. His claudication has been stable and unchanged Complaints today include intermittent right hip discomfort.  This seems worse in the morning when he awakens and then improves throughout the day with activity. His other concern is a blistering dermatitis involving his left anterior lower leg.  He has been using Eucerin cream  Past Medical History:  Diagnosis Date  . ALLERGIC RHINITIS 09/26/2007  . BENIGN PROSTATIC HYPERTROPHY 05/27/2007  . COLONIC POLYPS, HX OF 05/27/2007  . DERMATITIS 08/25/2010  . GERD 05/27/2007  . HYPERCHOLESTEROLEMIA 09/26/2007  . Peripheral vascular disease [facility])      Social History   Social History  . Marital status: Married    Spouse name: N/A  . Number of children: N/A  . Years of education: N/A   Occupational History  . Not on file.   Social History Main Topics  . Smoking status: Former Smoker    Quit date: 10/19/2001  . Smokeless tobacco: Never Used  . Alcohol use 3.6 oz/week    6 Cans of beer per week  . Drug use: No  . Sexual activity: Not on file   Other Topics Concern  . Not on file   Social History Narrative  . No narrative on file    Past Surgical History:  Procedure Laterality Date  . LOWER EXTREMITY ANGIOGRAM Left 04/30/2014   Procedure: LOWER EXTREMITY ANGIOGRAM;  Surgeon: Angelia Mould, MD;  Location: [facility] CATH LAB;  Service: Cardiovascular;  Laterality: Left;  . ROTATOR CUFF REPAIR    . VASECTOMY      Family History  Problem Relation Age of Onset  . Heart disease Mother   . Heart attack Mother   . Cancer Father     lung ca  . Hypertension Sister   . Diabetes Brother   . Hypertension Sister   . Diabetes Brother     . Diabetes Brother   . Diabetes Brother   . Diabetes Brother   . Diabetes Daughter     No Known Allergies  Current Outpatient Prescriptions on File Prior to Visit  Medication Sig Dispense Refill  . aspirin EC 81 MG tablet Take 81 mg by mouth daily.    Marland Kitchen atorvastatin (LIPITOR) 80 MG tablet Take 1 tablet (80 mg total) by mouth daily. 90 tablet 3  . Calcium 600-200 MG-UNIT per tablet Take 1 tablet by mouth daily.    Marland Kitchen doxycycline (VIBRA-TABS) 100 MG tablet Take 1 tablet (100 mg total) by mouth 2 (two) times daily. 20 tablet 0  . EPINEPHrine 0.3 mg/0.3 mL IJ SOAJ injection Inject 0.3 mLs (0.3 mg total) into the muscle once. 1 Device 1  . fluticasone (FLONASE) 50 MCG/ACT nasal spray PLACE 2 SPRAYS INTO BOTH NOSTRILS DAILY. 48 g 1  . predniSONE (DELTASONE) 2.5 MG tablet TAKE ONE TABLET(2.5 MG) IN THE MORNING AND HALF A TABLET (1.25 MG) IN THE AFTERNOON. 180 tablet 3  . tamsulosin (FLOMAX) 0.4 MG CAPS capsule Take 1 capsule (0.4 mg total) by mouth daily after supper. 90 capsule 3  . triamcinolone cream (KENALOG) 0.1 % Apply 1 application topically 2 (two) times daily. 180 g 1   No current  facility-administered medications on file prior to visit.     BP 130/68 (BP Location: Left Arm, Patient Position: Sitting, Cuff Size: Normal)   Pulse 74   Temp 98 F (36.7 C) (Oral)   Ht 5\' 11"  (1.803 m)   Wt 169 lb 12.8 oz (77 kg)   SpO2 98%   BMI 23.68 kg/m     Review of Systems  Constitutional: Negative for appetite change, chills, fatigue and fever.  HENT: Negative for congestion, dental problem, ear pain, hearing loss, sore throat, tinnitus, trouble swallowing and voice change.   Eyes: Negative for pain, discharge and visual disturbance.  Respiratory: Negative for cough, chest tightness, wheezing and stridor.   Cardiovascular: Negative for chest pain, palpitations and leg swelling.  Gastrointestinal: Negative for abdominal distention, abdominal pain, blood in stool, constipation, diarrhea,  nausea and vomiting.  Genitourinary: Negative for difficulty urinating, discharge, flank pain, genital sores, hematuria and urgency.  Musculoskeletal: Positive for back pain. Negative for arthralgias, gait problem, joint swelling, myalgias and neck stiffness.  Skin: Positive for rash.  Neurological: Negative for dizziness, syncope, speech difficulty, weakness, numbness and headaches.  Hematological: Negative for adenopathy. Does not bruise/bleed easily.  Psychiatric/Behavioral: Negative for behavioral problems and dysphoric mood. The patient is not nervous/anxious.        Objective:   Physical Exam  Constitutional: He appears well-developed. No distress.  Blood pressure stable  Skin:  Resolving patchy areas of dry flaky skin and postinflammatory hyperpigmentation involving the right anterior lower leg area          Assessment & Plan:   Nonspecific dermatitis, right anterior lower leg.  Will continue Eucerin cream and observe.  No evidence of infection.  Unclear cause of vesicular eruption; possibly traumatic Left leg claudication, stable PMR.  Check sedimentation rate.  If this is normal.  Will discontinue p.m. Dose of prednisone  CPX February as scheduled  Nyoka Cowden

## 2016-09-30 ENCOUNTER — Encounter: Payer: Self-pay | Admitting: Family Medicine

## 2016-09-30 ENCOUNTER — Ambulatory Visit (INDEPENDENT_AMBULATORY_CARE_PROVIDER_SITE_OTHER): Payer: Commercial Managed Care - HMO | Admitting: Family Medicine

## 2016-09-30 VITALS — BP 138/78 | HR 95 | Temp 98.4°F | Ht 71.0 in | Wt 164.6 lb

## 2016-09-30 DIAGNOSIS — R059 Cough, unspecified: Secondary | ICD-10-CM

## 2016-09-30 DIAGNOSIS — R05 Cough: Secondary | ICD-10-CM | POA: Diagnosis not present

## 2016-09-30 MED ORDER — HYDROCODONE-HOMATROPINE 5-1.5 MG/5ML PO SYRP: 5.0000 mL | ORAL_SOLUTION | Freq: Four times a day (QID) | ORAL | 0 refills | Status: AC | PRN

## 2016-09-30 NOTE — Progress Notes (Signed)
Pre visit review using our clinic review tool, if applicable. No additional management support is needed unless otherwise documented below in the visit note. 

## 2016-09-30 NOTE — Patient Instructions (Signed)
Follow up for any fever or increased shortness of breath. 

## 2016-09-30 NOTE — Progress Notes (Signed)
Subjective:     Patient ID: Brandon Dixon, male   DOB: Jun 16, 1946, 70 y.o.   MRN: [id number]  HPI   Patient is ex-smoker and seen with 3-4 day history of mostly dry cough. Mild sore throat and minimal nasal congestion. No sick contacts. Denies any fevers or chills. No nausea or vomiting. No dyspnea. No wheezing. No reflux symptoms. Interfering with sleep at night. Tried over-the-counter Mucinex and Robitussin without improvement. He has history of polymyalgia rheumatica and is maintained on low-dose prednisone. No recent pleuritic pain or hemoptysis  Past Medical History:  Diagnosis Date  . ALLERGIC RHINITIS 09/26/2007  . BENIGN PROSTATIC HYPERTROPHY 05/27/2007  . COLONIC POLYPS, HX OF 05/27/2007  . DERMATITIS 08/25/2010  . GERD 05/27/2007  . HYPERCHOLESTEROLEMIA 09/26/2007  . Peripheral vascular disease [facility])    Past Surgical History:  Procedure Laterality Date  . LOWER EXTREMITY ANGIOGRAM Left 04/30/2014   Procedure: LOWER EXTREMITY ANGIOGRAM;  Surgeon: Angelia Mould, MD;  Location: [facility] CATH LAB;  Service: Cardiovascular;  Laterality: Left;  . ROTATOR CUFF [place]      reports that he quit smoking about 14 years ago. He has never used smokeless tobacco. He reports that he drinks about 3.6 oz of alcohol per week . He reports that he does not use drugs. family history includes Cancer in his father; Diabetes in his brother, brother, brother, brother, brother, and daughter; Heart attack in his mother; Heart disease in his mother; Hypertension in his sister and sister. No Known Allergies   Review of Systems  Constitutional: Negative for chills and fever.  HENT: Positive for congestion.   Respiratory: Positive for cough. Negative for shortness of breath and wheezing.        Objective:   Physical Exam  Constitutional: He appears well-developed and well-nourished.  HENT:  Right Ear: External ear normal.  Left Ear: External ear normal.  Mouth/Throat: Oropharynx is clear  and moist.  Neck: Neck supple.  Cardiovascular: Normal rate and regular rhythm.   Pulmonary/Chest: Effort normal and breath sounds normal. No respiratory distress. He has no wheezes. He has no rales.       Assessment:     Acute cough. Nonfocal exam. Suspect acute viral bronchitis    Plan:     -Hycodan cough syrup 1 teaspoon daily at bedtime for severe cough -Follow-up promptly for any fever or increased shortness of breath or if cough not resolving over the next couple weeks  Eulas Post MD [facility]

## 2016-10-05 ENCOUNTER — Telehealth: Payer: Self-pay | Admitting: Internal Medicine

## 2016-10-05 NOTE — Telephone Encounter (Signed)
Pt is aware of annotations.  

## 2016-10-05 NOTE — Telephone Encounter (Signed)
Not unless he has any fever or increased shortness of breath.  Average duration of cough with bronchitis is about 18 days.  Definitely follow up for any worsening symptoms.

## 2016-10-05 NOTE — Telephone Encounter (Signed)
° °  Pt saw Dr Elease Hashimoto last week and call today to say that he still has a cough,mucus. He is asking if he need to come back in.

## 2016-11-20 ENCOUNTER — Encounter: Payer: Self-pay | Admitting: Internal Medicine

## 2016-11-20 ENCOUNTER — Ambulatory Visit (INDEPENDENT_AMBULATORY_CARE_PROVIDER_SITE_OTHER): Payer: Medicare HMO | Admitting: Internal Medicine

## 2016-11-20 ENCOUNTER — Ambulatory Visit: Payer: Commercial Managed Care - HMO | Admitting: Internal Medicine

## 2016-11-20 VITALS — BP 122/80 | HR 70 | Temp 97.5°F | Ht 71.0 in | Wt 162.8 lb

## 2016-11-20 DIAGNOSIS — J301 Allergic rhinitis due to pollen: Secondary | ICD-10-CM

## 2016-11-20 DIAGNOSIS — Z0000 Encounter for general adult medical examination without abnormal findings: Secondary | ICD-10-CM | POA: Diagnosis not present

## 2016-11-20 DIAGNOSIS — I70211 Atherosclerosis of native arteries of extremities with intermittent claudication, right leg: Secondary | ICD-10-CM | POA: Diagnosis not present

## 2016-11-20 DIAGNOSIS — M353 Polymyalgia rheumatica: Secondary | ICD-10-CM | POA: Diagnosis not present

## 2016-11-20 DIAGNOSIS — I739 Peripheral vascular disease, unspecified: Secondary | ICD-10-CM | POA: Diagnosis not present

## 2016-11-20 DIAGNOSIS — E7800 Pure hypercholesterolemia, unspecified: Secondary | ICD-10-CM

## 2016-11-20 LAB — CBC WITH DIFFERENTIAL/PLATELET
Basophils Absolute: 0.1 10*3/uL (ref 0.0–0.1)
Basophils Relative: 0.8 % (ref 0.0–3.0)
Eosinophils Absolute: 0.2 10*3/uL (ref 0.0–0.7)
Eosinophils Relative: 2.5 % (ref 0.0–5.0)
HCT: 39.8 % (ref 39.0–52.0)
Hemoglobin: 13.1 g/dL (ref 13.0–17.0)
Lymphocytes Relative: 12 % (ref 12.0–46.0)
Lymphs Abs: 1.1 10*3/uL (ref 0.7–4.0)
MCHC: 33 g/dL (ref 30.0–36.0)
MCV: 86.1 fl (ref 78.0–100.0)
Monocytes Absolute: 0.6 10*3/uL (ref 0.1–1.0)
Monocytes Relative: 6.3 % (ref 3.0–12.0)
Neutro Abs: 7.4 10*3/uL (ref 1.4–7.7)
Neutrophils Relative %: 78.4 % — ABNORMAL HIGH (ref 43.0–77.0)
Platelets: 334 10*3/uL (ref 150.0–400.0)
RBC: 4.62 Mil/uL (ref 4.22–5.81)
RDW: 15.8 % — ABNORMAL HIGH (ref 11.5–15.5)
WBC: 9.5 10*3/uL (ref 4.0–10.5)

## 2016-11-20 LAB — LIPID PANEL
Cholesterol: 154 mg/dL (ref 0–200)
HDL: 69.5 mg/dL (ref >39.00)
LDL Cholesterol: 76 mg/dL (ref 0–99)
NonHDL: 84.72
Total CHOL/HDL Ratio: 2
Triglycerides: 45 mg/dL (ref 0.0–149.0)
VLDL: 9 mg/dL (ref 0.0–40.0)

## 2016-11-20 LAB — COMPREHENSIVE METABOLIC PANEL
ALT: 23 U/L (ref 0–53)
AST: 27 U/L (ref 0–37)
Albumin: 3.8 g/dL (ref 3.5–5.2)
Alkaline Phosphatase: 128 U/L — ABNORMAL HIGH (ref 39–117)
BUN: 14 mg/dL (ref 6–23)
CO2: 34 mEq/L — ABNORMAL HIGH (ref 19–32)
Calcium: 9.7 mg/dL (ref 8.4–10.5)
Chloride: 100 mEq/L (ref 96–112)
Creatinine, Ser: 0.82 mg/dL (ref 0.40–1.50)
GFR: 119.25 mL/min (ref >60.00)
Glucose, Bld: 96 mg/dL (ref 70–99)
Potassium: 4.4 mEq/L (ref 3.5–5.1)
Sodium: 140 mEq/L (ref 135–145)
Total Bilirubin: 0.4 mg/dL (ref 0.2–1.2)
Total Protein: 7.7 g/dL (ref 6.0–8.3)

## 2016-11-20 LAB — TSH: TSH: 1.06 u[IU]/mL (ref 0.35–4.50)

## 2016-11-20 LAB — SEDIMENTATION RATE: Sed Rate: 25 mm/hr — ABNORMAL HIGH (ref 0–20)

## 2016-11-20 MED ORDER — SILDENAFIL CITRATE 20 MG PO TABS: ORAL_TABLET | ORAL | 2 refills | Status: DC

## 2016-11-20 NOTE — Patient Instructions (Addendum)
Limit your sodium (Salt) intake  Please check your blood pressure on a regular basis.  If it is consistently greater than 150/90, please make an office appointment.  Return in 6 months for follow-up    Health Maintenance, Male A healthy lifestyle and preventative care can promote health and wellness.  Maintain regular health, dental, and eye exams.  Eat a healthy diet. Foods like vegetables, fruits, whole grains, low-fat dairy products, and lean protein foods contain the nutrients you need and are low in calories. Decrease your intake of foods high in solid fats, added sugars, and salt. Get information about a proper diet from your health care provider, if necessary.  Regular physical exercise is one of the most important things you can do for your health. Most adults should get at least 150 minutes of moderate-intensity exercise (any activity that increases your heart rate and causes you to sweat) each week. In addition, most adults need muscle-strengthening exercises on 2 or more days a week.   Maintain a healthy weight. The body mass index (BMI) is a screening tool to identify possible weight problems. It provides an estimate of body fat based on height and weight. Your health care provider can find your BMI and can help you achieve or maintain a healthy weight. For males 20 years and older:  A BMI below 18.5 is considered underweight.  A BMI of 18.5 to 24.9 is normal.  A BMI of 25 to 29.9 is considered overweight.  A BMI of 30 and above is considered obese.  Maintain normal blood lipids and cholesterol by exercising and minimizing your intake of saturated fat. Eat a balanced diet with plenty of fruits and vegetables. Blood tests for lipids and cholesterol should begin at age 33 and be repeated every 5 years. If your lipid or cholesterol levels are high, you are over age 41, or you are at high risk for heart disease, you may need your cholesterol levels checked more frequently.Ongoing  high lipid and cholesterol levels should be treated with medicines if diet and exercise are not working.  If you smoke, find out from your health care provider how to quit. If you do not use tobacco, do not start.  Lung cancer screening is recommended for adults aged 57-80 years who are at high risk for developing lung cancer because of a history of smoking. A yearly low-dose CT scan of the lungs is recommended for people who have at least a 30-pack-year history of smoking and are current smokers or have quit within the past 15 years. A pack year of smoking is smoking an average of 1 pack of cigarettes a day for 1 year (for example, a 30-pack-year history of smoking could mean smoking 1 pack a day for 30 years or 2 packs a day for 15 years). Yearly screening should continue until the smoker has stopped smoking for at least 15 years. Yearly screening should be stopped for people who develop a health problem that would prevent them from having lung cancer treatment.  If you choose to drink alcohol, do not have more than 2 drinks per day. One drink is considered to be 12 oz (360 mL) of beer, 5 oz (150 mL) of wine, or 1.5 oz (45 mL) of liquor.  Avoid the use of street drugs. Do not share needles with anyone. Ask for help if you need support or instructions about stopping the use of drugs.  High blood pressure causes heart disease and increases the risk of stroke. High  blood pressure is more likely to develop in:  People who have blood pressure in the end of the normal range (100-139/85-89 mm Hg).  People who are overweight or obese.  People who are African American.  If you are 20-33 years of age, have your blood pressure checked every 3-5 years. If you are 13 years of age or older, have your blood pressure checked every year. You should have your blood pressure measured twice-once when you are at a hospital or clinic, and once when you are not at a hospital or clinic. Record the average of the two  measurements. To check your blood pressure when you are not at a hospital or clinic, you can use:  An automated blood pressure machine at a pharmacy.  A home blood pressure monitor.  If you are 6-88 years old, ask your health care provider if you should take aspirin to prevent heart disease.  Diabetes screening involves taking a blood sample to check your fasting blood sugar level. This should be done once every 3 years after age 34 if you are at a normal weight and without risk factors for diabetes. Testing should be considered at a younger age or be carried out more frequently if you are overweight and have at least 1 risk factor for diabetes.  Colorectal cancer can be detected and often prevented. Most routine colorectal cancer screening begins at the age of 12 and continues through age 64. However, your health care provider may recommend screening at an earlier age if you have risk factors for colon cancer. On a yearly basis, your health care provider may provide home test kits to check for hidden blood in the stool. A small camera at the end of a tube may be used to directly examine the colon (sigmoidoscopy or colonoscopy) to detect the earliest forms of colorectal cancer. Talk to your health care provider about this at age 23 when routine screening begins. A direct exam of the colon should be repeated every 5-10 years through age 37, unless early forms of precancerous polyps or small growths are found.  People who are at an increased risk for hepatitis B should be screened for this virus. You are considered at high risk for hepatitis B if:  You were born in a country where hepatitis B occurs often. Talk with your health care provider about which countries are considered high risk.  Your parents were born in a high-risk country and you have not received a shot to protect against hepatitis B (hepatitis B vaccine).  You have HIV or AIDS.  You use needles to inject street drugs.  You live  with, or have sex with, someone who has hepatitis B.  You are a man who has sex with other men (MSM).  You get hemodialysis treatment.  You take certain medicines for conditions like cancer, organ transplantation, and autoimmune conditions.  Hepatitis C blood testing is recommended for all people born from [birth year] through 1965 and any individual with known risk factors for hepatitis C.  Healthy men should no longer receive prostate-specific antigen (PSA) blood tests as part of routine cancer screening. Talk to your health care provider about prostate cancer screening.  Testicular cancer screening is not recommended for adolescents or adult males who have no symptoms. Screening includes self-exam, a health care provider exam, and other screening tests. Consult with your health care provider about any symptoms you have or any concerns you have about testicular cancer.  Practice safe sex. Use condoms and  avoid high-risk sexual practices to reduce the spread of sexually transmitted infections (STIs).  You should be screened for STIs, including gonorrhea and chlamydia if:  You are sexually active and are younger than 24 years.  You are older than 24 years, and your health care provider tells you that you are at risk for this type of infection.  Your sexual activity has changed since you were last screened, and you are at an increased risk for chlamydia or gonorrhea. Ask your health care provider if you are at risk.  If you are at risk of being infected with HIV, it is recommended that you take a prescription medicine daily to prevent HIV infection. This is called pre-exposure prophylaxis (PrEP). You are considered at risk if:  You are a man who has sex with other men (MSM).  You are a heterosexual man who is sexually active with multiple partners.  You take drugs by injection.  You are sexually active with a partner who has HIV.  Talk with your health care provider about whether you are at  high risk of being infected with HIV. If you choose to begin PrEP, you should first be tested for HIV. You should then be tested every 3 months for as long as you are taking PrEP.  Use sunscreen. Apply sunscreen liberally and repeatedly throughout the day. You should seek shade when your shadow is shorter than you. Protect yourself by wearing long sleeves, pants, a wide-brimmed hat, and sunglasses year round whenever you are outdoors.  Tell your health care provider of new moles or changes in moles, especially if there is a change in shape or color. Also, tell your health care provider if a mole is larger than the size of a pencil eraser.  A one-time screening for abdominal aortic aneurysm (AAA) and surgical repair of large AAAs by ultrasound is recommended for men aged 17-75 years who are current or former smokers.  Stay current with your vaccines (immunizations). This information is not intended to replace advice given to you by your health care provider. Make sure you discuss any questions you have with your health care provider. Document Released: 04/02/2008 Document Revised: 10/26/2014 Document Reviewed: 07/09/2015 Elsevier Interactive Patient Education  2017 [employer].

## 2016-11-20 NOTE — Progress Notes (Signed)
Subjective:    Patient ID: Brandon Dixon, male    DOB: 01/28/1946, 71 y.o.   MRN: [id number]  HPI 71 year old patient who is seen today for an annual exam. He remains stable.  He does have chronic nonprogressive left leg claudication.  He has minimal pain after walking 1 quarter of a mile He has a history of PMR and remains on low-dose prednisone therapy. He has dyslipidemia and a history of colonic polyps and mild BPH  Past Medical History:  Diagnosis Date  . ALLERGIC RHINITIS 09/26/2007  . BENIGN PROSTATIC HYPERTROPHY 05/27/2007  . COLONIC POLYPS, HX OF 05/27/2007  . DERMATITIS 08/25/2010  . GERD 05/27/2007  . HYPERCHOLESTEROLEMIA 09/26/2007  . Peripheral vascular disease [facility])      Social History   Social History  . Marital status: Married    Spouse name: N/A  . Number of children: N/A  . Years of education: N/A   Occupational History  . Not on file.   Social History Main Topics  . Smoking status: Former Smoker    Quit date: 10/19/2001  . Smokeless tobacco: Never Used  . Alcohol use 3.6 oz/week    6 Cans of beer per week  . Drug use: No  . Sexual activity: Not on file   Other Topics Concern  . Not on file   Social History Narrative  . No narrative on file    Past Surgical History:  Procedure Laterality Date  . LOWER EXTREMITY ANGIOGRAM Left 04/30/2014   Procedure: LOWER EXTREMITY ANGIOGRAM;  Surgeon: Angelia Mould, MD;  Location: [facility] CATH LAB;  Service: Cardiovascular;  Laterality: Left;  . ROTATOR CUFF REPAIR    . VASECTOMY      Family History  Problem Relation Age of Onset  . Heart disease Mother   . Heart attack Mother   . Cancer Father     lung ca  . Hypertension Sister   . Diabetes Brother   . Hypertension Sister   . Diabetes Brother   . Diabetes Brother   . Diabetes Brother   . Diabetes Brother   . Diabetes Daughter     No Known Allergies  Current Outpatient Prescriptions on File Prior to Visit  Medication Sig Dispense Refill  .  aspirin EC 81 MG tablet Take 81 mg by mouth daily.    Marland Kitchen atorvastatin (LIPITOR) 80 MG tablet Take 1 tablet (80 mg total) by mouth daily. 90 tablet 3  . Calcium 600-200 MG-UNIT per tablet Take 1 tablet by mouth daily.    Marland Kitchen EPINEPHrine 0.3 mg/0.3 mL IJ SOAJ injection Inject 0.3 mLs (0.3 mg total) into the muscle once. 1 Device 1  . fluticasone (FLONASE) 50 MCG/ACT nasal spray PLACE 2 SPRAYS INTO BOTH NOSTRILS DAILY. 48 g 1  . predniSONE (DELTASONE) 2.5 MG tablet TAKE ONE TABLET(2.5 MG) IN THE MORNING AND HALF A TABLET (1.25 MG) IN THE AFTERNOON. 180 tablet 3  . tamsulosin (FLOMAX) 0.4 MG CAPS capsule Take 1 capsule (0.4 mg total) by mouth daily after supper. 90 capsule 3  . triamcinolone cream (KENALOG) 0.1 % Apply 1 application topically 2 (two) times daily. 180 g 1   No current facility-administered medications on file prior to visit.     BP 122/80 (BP Location: Right Arm, Patient Position: Sitting, Cuff Size: Normal)   Pulse 70   Temp 97.5 F (36.4 C) (Oral)   Ht 5\' 11"  (1.803 m)   Wt 162 lb 12.8 oz (73.8 kg)   BMI 22.71  kg/m   Medicare wellness visit  1. Risk factors, based on past  M,S,F history.  Current vascular risk factors include a history of PAD, as well as dyslipidemia.  He has a history remote tobacco use and discontinued in 2003  2.  Physical activities:walks daily.  Does have some chronic stable left leg claudication  3.  Depression/mood:no history of major depression or mood disorder  4.  Hearing:no deficits  5.  ADL's:independent  6.  Fall risk:low  7.  Home safety:no problems identified  8.  Height weight, and visual acuity;height and weight stable no change in visual acuity  9.  Counseling:continue heart healthy diet and active lifestyle.  10. Lab orders based on risk factors:laboratory studies will be reviewed with special attention to lipid profile and sedimentation rate  11. Referral :not appropriate at this time  12. Care plan:continue efforts at  aggressive risk factor modification and daily aspirin  13. Cognitive assessment: alert and oriented with normal affect.  No cognitive dysfunction  14. Screening: Patient provided with a written and personalized 5-10 year screening schedule in the AVS.    15. Provider List Update: primary care medicine ophthalmology GI    Review of Systems  Constitutional: Negative for appetite change, chills, fatigue and fever.  HENT: Positive for postnasal drip and rhinorrhea. Negative for congestion, dental problem, ear pain, hearing loss, sore throat, tinnitus, trouble swallowing and voice change.   Eyes: Negative for pain, discharge and visual disturbance.  Respiratory: Negative for cough, chest tightness, wheezing and stridor.   Cardiovascular: Negative for chest pain, palpitations and leg swelling.  Gastrointestinal: Negative for abdominal distention, abdominal pain, blood in stool, constipation, diarrhea, nausea and vomiting.  Genitourinary: Negative for difficulty urinating, discharge, flank pain, genital sores, hematuria and urgency.  Musculoskeletal: Positive for neck pain. Negative for arthralgias, back pain, gait problem, joint swelling, myalgias and neck stiffness.  Skin: Negative for rash.  Neurological: Negative for dizziness, syncope, speech difficulty, weakness, numbness and headaches.  Hematological: Negative for adenopathy. Does not bruise/bleed easily.  Psychiatric/Behavioral: Negative for behavioral problems and dysphoric mood. The patient is not nervous/anxious.        Objective:   Physical Exam  Constitutional: He is oriented to person, place, and time. He appears well-developed and well-nourished.  HENT:  Head: Normocephalic and atraumatic.  Right Ear: External ear normal.  Left Ear: External ear normal.  Nose: Nose normal.  Mouth/Throat: Oropharynx is clear and moist.  Eyes: Conjunctivae and EOM are normal. Pupils are equal, round, and reactive to light. No scleral icterus.   Neck: Normal range of motion. Neck supple. No JVD present. No thyromegaly present.  Cardiovascular: Normal rate, regular rhythm and normal heart sounds.  Exam reveals no gallop and no friction rub.   No murmur heard. The right dorsalis pedis pulses full other pedal pulses not easily palpable  Pulmonary/Chest: Effort normal and breath sounds normal. He exhibits no tenderness.  Abdominal: Soft. Bowel sounds are normal. He exhibits no distension and no mass. There is no tenderness.  Genitourinary: Prostate normal and penis normal. Rectal exam shows guaiac negative stool.  Genitourinary Comments: Prostate minimally enlarged  Musculoskeletal: Normal range of motion. He exhibits no edema or tenderness.  Lymphadenopathy:    He has no cervical adenopathy.  Neurological: He is alert and oriented to person, place, and time. He has normal reflexes. No cranial nerve deficit. Coordination normal.  Skin: Skin is warm and dry. No rash noted.  Psychiatric: He has a normal mood and affect.  His behavior is normal.          Assessment & Plan:   Preventive health examination Medicare wellness visit PMR.  Will check a sedimentation rate PAD remained stable Dyslipidemia.  Continue statin therapy  Follow-up 6 months  Medicines renewed  Nyoka Cowden

## 2016-11-20 NOTE — Progress Notes (Signed)
Pre visit review using our clinic review tool, if applicable. No additional management support is needed unless otherwise documented below in the visit note. 

## 2016-11-25 ENCOUNTER — Other Ambulatory Visit: Payer: Self-pay | Admitting: Internal Medicine

## 2016-11-25 MED ORDER — FLUTICASONE PROPIONATE 50 MCG/ACT NA SUSP: NASAL | 1 refills | Status: DC

## 2016-11-25 MED ORDER — TAMSULOSIN HCL 0.4 MG PO CAPS: 0.4000 mg | ORAL_CAPSULE | Freq: Every day | ORAL | 3 refills | Status: DC

## 2016-11-27 ENCOUNTER — Other Ambulatory Visit: Payer: Self-pay | Admitting: Internal Medicine

## 2016-11-27 MED ORDER — TAMSULOSIN HCL 0.4 MG PO CAPS: 0.4000 mg | ORAL_CAPSULE | Freq: Every day | ORAL | 3 refills | Status: DC

## 2016-11-27 MED ORDER — FLUTICASONE PROPIONATE 50 MCG/ACT NA SUSP: NASAL | 1 refills | Status: DC

## 2016-12-04 ENCOUNTER — Encounter: Payer: Commercial Managed Care - HMO | Admitting: Internal Medicine

## 2016-12-10 ENCOUNTER — Other Ambulatory Visit: Payer: Self-pay | Admitting: Internal Medicine

## 2016-12-10 MED ORDER — FLUTICASONE PROPIONATE 50 MCG/ACT NA SUSP: NASAL | 1 refills | Status: DC

## 2016-12-10 MED ORDER — TAMSULOSIN HCL 0.4 MG PO CAPS: 0.4000 mg | ORAL_CAPSULE | Freq: Every day | ORAL | 3 refills | Status: DC

## 2017-03-17 DIAGNOSIS — H5210 Myopia, unspecified eye: Secondary | ICD-10-CM | POA: Diagnosis not present

## 2017-05-24 ENCOUNTER — Ambulatory Visit (INDEPENDENT_AMBULATORY_CARE_PROVIDER_SITE_OTHER): Payer: Medicare HMO | Admitting: Internal Medicine

## 2017-05-24 ENCOUNTER — Encounter: Payer: Self-pay | Admitting: Internal Medicine

## 2017-05-24 VITALS — BP 100/58 | HR 78 | Temp 98.2°F | Ht 71.0 in | Wt 166.0 lb

## 2017-05-24 DIAGNOSIS — I739 Peripheral vascular disease, unspecified: Secondary | ICD-10-CM

## 2017-05-24 DIAGNOSIS — J302 Other seasonal allergic rhinitis: Secondary | ICD-10-CM

## 2017-05-24 DIAGNOSIS — M353 Polymyalgia rheumatica: Secondary | ICD-10-CM | POA: Diagnosis not present

## 2017-05-24 DIAGNOSIS — E7800 Pure hypercholesterolemia, unspecified: Secondary | ICD-10-CM

## 2017-05-24 LAB — SEDIMENTATION RATE: Sed Rate: 65 mm/hr — ABNORMAL HIGH (ref 0–20)

## 2017-05-24 NOTE — Patient Instructions (Signed)
Limit your sodium (Salt) intake    It is important that you exercise regularly, at least 20 minutes 3 to 4 times per week.  If you develop chest pain or shortness of breath seek  medical attention.  Return in 6 months for follow-up  

## 2017-05-24 NOTE — Progress Notes (Signed)
Subjective:    Patient ID: Brandon Dixon, male    DOB: Mar 21, 1946, 71 y.o.   MRN: [id number]  HPI  71 year old patient who is seen today for his six-month follow-up. He has a history of PAD and stable left leg claudication.  When he walks continuously, he usually develops left leg claudication which responds to rest and he is able to proceed. He has an activity monitor and he states that some days he walks is much as 18,000.  Steps without pain. Denies any exertional chest pain. He has a long history of PMR and has remained stable on 2.5 milligrams of prednisone daily He has dyslipidemia and remains on high intensity statin therapy, which she continues to tolerate. No new concerns or complaints  Past Medical History:  Diagnosis Date  . ALLERGIC RHINITIS 09/26/2007  . BENIGN PROSTATIC HYPERTROPHY 05/27/2007  . COLONIC POLYPS, HX OF 05/27/2007  . DERMATITIS 08/25/2010  . GERD 05/27/2007  . HYPERCHOLESTEROLEMIA 09/26/2007  . Peripheral vascular disease [facility])      Social History   Social History  . Marital status: Married    Spouse name: N/A  . Number of children: N/A  . Years of education: N/A   Occupational History  . Not on file.   Social History Main Topics  . Smoking status: Former Smoker    Quit date: 10/19/2001  . Smokeless tobacco: Never Used  . Alcohol use 3.6 oz/week    6 Cans of beer per week  . Drug use: No  . Sexual activity: Not on file   Other Topics Concern  . Not on file   Social History Narrative  . No narrative on file    Past Surgical History:  Procedure Laterality Date  . LOWER EXTREMITY ANGIOGRAM Left 04/30/2014   Procedure: LOWER EXTREMITY ANGIOGRAM;  Surgeon: Angelia Mould, MD;  Location: [facility] CATH LAB;  Service: Cardiovascular;  Laterality: Left;  . ROTATOR CUFF REPAIR    . VASECTOMY      Family History  Problem Relation Age of Onset  . Heart disease Mother   . Heart attack Mother   . Cancer Father        lung ca  . Hypertension  Sister   . Diabetes Brother   . Hypertension Sister   . Diabetes Brother   . Diabetes Brother   . Diabetes Brother   . Diabetes Brother   . Diabetes Daughter     No Known Allergies  Current Outpatient Prescriptions on File Prior to Visit  Medication Sig Dispense Refill  . aspirin EC 81 MG tablet Take 81 mg by mouth daily.    Marland Kitchen atorvastatin (LIPITOR) 80 MG tablet Take 1 tablet (80 mg total) by mouth daily. 90 tablet 3  . Calcium 600-200 MG-UNIT per tablet Take 1 tablet by mouth daily.    Marland Kitchen EPINEPHrine 0.3 mg/0.3 mL IJ SOAJ injection Inject 0.3 mLs (0.3 mg total) into the muscle once. 1 Device 1  . fluticasone (FLONASE) 50 MCG/ACT nasal spray PLACE 2 SPRAYS INTO BOTH NOSTRILS DAILY. 48 g 1  . predniSONE (DELTASONE) 2.5 MG tablet TAKE ONE TABLET(2.5 MG) IN THE MORNING AND HALF A TABLET (1.25 MG) IN THE AFTERNOON. 180 tablet 3  . sildenafil (REVATIO) 20 MG tablet 2 tablets daily as needed 90 tablet 2  . tamsulosin (FLOMAX) 0.4 MG CAPS capsule Take 1 capsule (0.4 mg total) by mouth daily after supper. 90 capsule 3  . triamcinolone cream (KENALOG) 0.1 % Apply 1 application topically  2 (two) times daily. 180 g 1   No current facility-administered medications on file prior to visit.     BP (!) 100/58 (BP Location: Left Arm, Patient Position: Sitting, Cuff Size: Normal)   Pulse 78   Temp 98.2 F (36.8 C) (Oral)   Ht '5\' 11"'  (1.803 m)   Wt 166 lb (75.3 kg)   SpO2 98%   BMI 23.15 kg/m     Review of Systems  Constitutional: Negative for appetite change, chills, fatigue and fever.  HENT: Negative for congestion, dental problem, ear pain, hearing loss, sore throat, tinnitus, trouble swallowing and voice change.   Eyes: Negative for pain, discharge and visual disturbance.  Respiratory: Negative for cough, chest tightness, wheezing and stridor.   Cardiovascular: Negative for chest pain, palpitations and leg swelling.       Left leg claudication  Gastrointestinal: Negative for abdominal  distention, abdominal pain, blood in stool, constipation, diarrhea, nausea and vomiting.  Genitourinary: Negative for difficulty urinating, discharge, flank pain, genital sores, hematuria and urgency.  Musculoskeletal: Negative for arthralgias, back pain, gait problem, joint swelling, myalgias and neck stiffness.  Skin: Negative for rash.  Neurological: Negative for dizziness, syncope, speech difficulty, weakness, numbness and headaches.  Hematological: Negative for adenopathy. Does not bruise/bleed easily.  Psychiatric/Behavioral: Negative for behavioral problems and dysphoric mood. The patient is not nervous/anxious.        Objective:   Physical Exam  Constitutional: He is oriented to person, place, and time. He appears well-developed.  Blood pressure low normal  HENT:  Head: Normocephalic.  Right Ear: External ear normal.  Left Ear: External ear normal.  Eyes: Conjunctivae and EOM are normal.  Neck: Normal range of motion.  Cardiovascular: Normal rate and normal heart sounds.   Pedal pulses not easily palpable except for a full right dorsalis pedis pulse  Pulmonary/Chest: Breath sounds normal.  Abdominal: Bowel sounds are normal.  Musculoskeletal: Normal range of motion. He exhibits no edema or tenderness.  Neurological: He is alert and oriented to person, place, and time.  Psychiatric: He has a normal mood and affect. His behavior is normal.          Assessment & Plan:   PAD with stable left leg claudication.  PMR.  Will check a sedimentation rate.  Hopefully, to further down titrate prednisone Dyslipidemia.  Continue statin therapy  Check ESR CPX 6 months Set of vascular surgery follow-up.  At that time  [facility]

## 2017-06-17 ENCOUNTER — Other Ambulatory Visit: Payer: Self-pay | Admitting: Internal Medicine

## 2017-07-08 ENCOUNTER — Encounter: Payer: Self-pay | Admitting: Internal Medicine

## 2017-08-06 ENCOUNTER — Other Ambulatory Visit: Payer: Self-pay | Admitting: Internal Medicine

## 2017-08-24 ENCOUNTER — Other Ambulatory Visit (INDEPENDENT_AMBULATORY_CARE_PROVIDER_SITE_OTHER): Payer: Medicare HMO

## 2017-08-24 DIAGNOSIS — M353 Polymyalgia rheumatica: Secondary | ICD-10-CM | POA: Diagnosis not present

## 2017-08-24 LAB — SEDIMENTATION RATE: Sed Rate: 25 mm/hr — ABNORMAL HIGH (ref 0–20)

## 2017-08-27 ENCOUNTER — Telehealth: Payer: Self-pay | Admitting: Internal Medicine

## 2017-08-27 NOTE — Telephone Encounter (Signed)
Pt would like blood work results °

## 2017-08-27 NOTE — Telephone Encounter (Signed)
Please advise 

## 2017-08-30 MED ORDER — PREDNISONE 2.5 MG PO TABS: ORAL_TABLET | ORAL | 3 refills | Status: DC

## 2017-08-30 NOTE — Telephone Encounter (Signed)
Notify patient his sedimentation rate was the upper limits of normal.  Repeat 3 months and consider dose reduction at that time

## 2017-08-30 NOTE — Telephone Encounter (Signed)
informed patient his sedimentation rate was the upper limits of normal.  Repeat 3 months and consider dose reduction at that time. Pt verbalized understanding.

## 2017-11-24 ENCOUNTER — Encounter: Payer: Medicare HMO | Admitting: Internal Medicine

## 2017-11-29 ENCOUNTER — Encounter: Payer: Self-pay | Admitting: Internal Medicine

## 2017-11-29 ENCOUNTER — Ambulatory Visit (INDEPENDENT_AMBULATORY_CARE_PROVIDER_SITE_OTHER): Payer: Medicare HMO | Admitting: Internal Medicine

## 2017-11-29 VITALS — BP 102/62 | HR 88 | Temp 98.3°F | Ht 72.0 in | Wt 171.0 lb

## 2017-11-29 DIAGNOSIS — I70211 Atherosclerosis of native arteries of extremities with intermittent claudication, right leg: Secondary | ICD-10-CM

## 2017-11-29 DIAGNOSIS — Z0000 Encounter for general adult medical examination without abnormal findings: Secondary | ICD-10-CM

## 2017-11-29 DIAGNOSIS — E7800 Pure hypercholesterolemia, unspecified: Secondary | ICD-10-CM

## 2017-11-29 DIAGNOSIS — I739 Peripheral vascular disease, unspecified: Secondary | ICD-10-CM

## 2017-11-29 DIAGNOSIS — M353 Polymyalgia rheumatica: Secondary | ICD-10-CM | POA: Diagnosis not present

## 2017-11-29 NOTE — Patient Instructions (Signed)
Follow-up with vascular surgery/Dr. Scot Dock as discussed   Limit your sodium (Salt) intake    It is important that you exercise regularly, at least 20 minutes 3 to 4 times per week.  If you develop chest pain or shortness of breath seek  medical attention.  Please check your blood pressure on a regular basis.  If it is consistently greater than 140/90, please make an office appointment.

## 2017-11-29 NOTE — Progress Notes (Signed)
Subjective:    Patient ID: Brandon Dixon, male    DOB: [birth year], 72 y.o.   MRN: [id number]  HPI  72 year old patient who is seen today for an annual health assessment and subsequent Medicare wellness visit He has a history of chronic stable left leg claudication. Denies any cardiopulmonary complaints.  He does have some right anterior chest wall pain that is aggravated by cough and sneezing He has a history of allergic rhinitis and also polymyalgia rheumatica.  He is maintained on low-dose prednisone.   Past Medical History:  Diagnosis Date  . ALLERGIC RHINITIS 09/26/2007  . BENIGN PROSTATIC HYPERTROPHY 05/27/2007  . COLONIC POLYPS, HX OF 05/27/2007  . DERMATITIS 08/25/2010  . GERD 05/27/2007  . HYPERCHOLESTEROLEMIA 09/26/2007  . Peripheral vascular disease (HCC)      Social History   Socioeconomic History  . Marital status: Married    Spouse name: Not on file  . Number of children: Not on file  . Years of education: Not on file  . Highest education level: Not on file  Social Needs  . Financial resource strain: Not on file  . Food insecurity - worry: Not on file  . Food insecurity - inability: Not on file  . Transportation needs - medical: Not on file  . Transportation needs - non-medical: Not on file  Occupational History  . Not on file  Tobacco Use  . Smoking status: Former Smoker    Last attempt to quit: 10/19/2001    Years since quitting: 16.1  . Smokeless tobacco: Never Used  Substance and Sexual Activity  . Alcohol use: Yes    Alcohol/week: 3.6 oz    Types: 6 Cans of beer per week  . Drug use: No  . Sexual activity: Not on file  Other Topics Concern  . Not on file  Social History Narrative  . Not on file    Past Surgical History:  Procedure Laterality Date  . LOWER EXTREMITY ANGIOGRAM Left 04/30/2014   Procedure: LOWER EXTREMITY ANGIOGRAM;  Surgeon: Chuck Hint, MD;  Location: [facility] CATH LAB;  Service: Cardiovascular;  Laterality: Left;  . ROTATOR  CUFF REPAIR    . VASECTOMY      Family History  Problem Relation Age of Onset  . Heart disease Mother   . Heart attack Mother   . Cancer Father        lung ca  . Hypertension Sister   . Diabetes Brother   . Hypertension Sister   . Diabetes Brother   . Diabetes Brother   . Diabetes Brother   . Diabetes Brother   . Diabetes Daughter     No Known Allergies  Current Outpatient Medications on File Prior to Visit  Medication Sig Dispense Refill  . aspirin EC 81 MG tablet Take 81 mg by mouth daily.    Marland Kitchen atorvastatin (LIPITOR) 80 MG tablet TAKE 1 TABLET EVERY DAY 90 tablet 3  . Calcium 600-200 MG-UNIT per tablet Take 1 tablet by mouth daily.    Marland Kitchen EPINEPHrine 0.3 mg/0.3 mL IJ SOAJ injection Inject 0.3 mLs (0.3 mg total) into the muscle once. 1 Device 1  . fluticasone (FLONASE) 50 MCG/ACT nasal spray PLACE 2 SPRAYS INTO BOTH NOSTRILS DAILY. 48 g 1  . predniSONE (DELTASONE) 2.5 MG tablet TAKE 1 TABLET IN THE MORNING  AND TAKE 1/2 TABLET  IN  THE  AFTERNOON 135 tablet 3  . sildenafil (REVATIO) 20 MG tablet 2 tablets daily as needed 90 tablet 2  .  tamsulosin (FLOMAX) 0.4 MG CAPS capsule Take 1 capsule (0.4 mg total) by mouth daily after supper. 90 capsule 3  . triamcinolone cream (KENALOG) 0.1 % Apply 1 application topically 2 (two) times daily. 180 g 1   No current facility-administered medications on file prior to visit.     BP 102/62 (BP Location: Right Arm, Patient Position: Sitting, Cuff Size: Normal)   Pulse 88   Temp 98.3 F (36.8 C) (Oral)   Ht 6' (1.829 m)   Wt 171 lb (77.6 kg)   SpO2 96%   BMI 23.19 kg/m   Subsequent  Medicare wellness visit  1. Risk factors, based on past  M,S,F history.  Cardiovascular risk factors include dyslipidemia.  He has known peripheral arterial disease and stable left leg claudication  2.  Physical activities: No activity restrictions.  Is quite active on a farm walks daily  3.  Depression/mood:  No history of major depression or mood  disorder 4.  Hearing: No deficits   5.  ADL's: Independent in all aspects of daily living  6.  Fall risk: Low  7.  Home safety: No problems identified  8.  Height weight, and visual acuity; height and weight stable no change in visual acuity  9.  Counseling: Continue heart healthy diet and exercise regimen  10. Lab orders based on risk factors: Laboratory update including sedimentation rate will be reviewed will review a lipid profile  11. Referral : Cardiovascular surgical follow-up  12. Care plan: Continue efforts at aggressive risk factor modification  13. Cognitive assessment: Alert and oriented with normal affect.  No cognitive dysfunction  14. Screening: Patient provided with a written and personalized 5-10 year screening schedule in the AVS.    15. Provider List Update: Vascular surgery primary care      Review of Systems  Constitutional: Negative for appetite change, chills, fatigue and fever.  HENT: Negative for congestion, dental problem, ear pain, hearing loss, sore throat, tinnitus, trouble swallowing and voice change.   Eyes: Negative for pain, discharge and visual disturbance.  Respiratory: Negative for cough, chest tightness, wheezing and stridor.   Cardiovascular: Positive for chest pain. Negative for palpitations and leg swelling.       Stable left leg claudication  Gastrointestinal: Negative for abdominal distention, abdominal pain, blood in stool, constipation, diarrhea, nausea and vomiting.  Genitourinary: Negative for difficulty urinating, discharge, flank pain, genital sores, hematuria and urgency.  Musculoskeletal: Negative for arthralgias, back pain, gait problem, joint swelling, myalgias and neck stiffness.  Skin: Negative for rash.  Neurological: Negative for dizziness, syncope, speech difficulty, weakness, numbness and headaches.  Hematological: Negative for adenopathy. Does not bruise/bleed easily.  Psychiatric/Behavioral: Negative for behavioral  problems and dysphoric mood. The patient is not nervous/anxious.        Objective:   Physical Exam  Constitutional: He appears well-developed and well-nourished.  HENT:  Head: Normocephalic and atraumatic.  Right Ear: External ear normal.  Left Ear: External ear normal.  Nose: Nose normal.  Mouth/Throat: Oropharynx is clear and moist.  Eyes: Conjunctivae and EOM are normal. Pupils are equal, round, and reactive to light. No scleral icterus.  Neck: Normal range of motion. Neck supple. No JVD present. No thyromegaly present.  Right supraclavicular bruit  Cardiovascular: Regular rhythm and normal heart sounds. Exam reveals no gallop and no friction rub.  No murmur heard. Bilateral femoral bruits The right dorsalis pedis pulse full.  Other pedal pulses not easily palpable  Pulmonary/Chest: Effort normal and breath sounds normal.  He exhibits no tenderness.  Abdominal: Soft. Bowel sounds are normal. He exhibits no distension and no mass. There is no tenderness.  Genitourinary: Prostate normal and penis normal.  Musculoskeletal: Normal range of motion. He exhibits no edema or tenderness.  Lymphadenopathy:    He has no cervical adenopathy.  Neurological: He is alert. He has normal reflexes. No cranial nerve deficit. Coordination normal.  Skin: Skin is warm and dry. No rash noted.  Psychiatric: He has a normal mood and affect. His behavior is normal.          Assessment & Plan:   Preventive health examination Subsequent Medicare wellness visit PAD with stable left leg claudication.  Follow-up vascular surgery with ABIs Dyslipidemia continue statin therapy PMR.  Will review a sed rate.  If this is normal will decrease prednisone to a every other day regimen  Follow-up 6 months  Akari [employer] Homero Fellers

## 2017-11-30 LAB — CBC WITH DIFFERENTIAL/PLATELET
Basophils Absolute: 0.1 10*3/uL (ref 0.0–0.1)
Basophils Relative: 1.1 % (ref 0.0–3.0)
Eosinophils Absolute: 0.2 10*3/uL (ref 0.0–0.7)
Eosinophils Relative: 1.9 % (ref 0.0–5.0)
HCT: 43 % (ref 39.0–52.0)
Hemoglobin: 14.2 g/dL (ref 13.0–17.0)
Lymphocytes Relative: 19.5 % (ref 12.0–46.0)
Lymphs Abs: 1.9 10*3/uL (ref 0.7–4.0)
MCHC: 32.9 g/dL (ref 30.0–36.0)
MCV: 87 fl (ref 78.0–100.0)
Monocytes Absolute: 0.9 10*3/uL (ref 0.1–1.0)
Monocytes Relative: 9.3 % (ref 3.0–12.0)
Neutro Abs: 6.7 10*3/uL (ref 1.4–7.7)
Neutrophils Relative %: 68.2 % (ref 43.0–77.0)
Platelets: 252 10*3/uL (ref 150.0–400.0)
RBC: 4.95 Mil/uL (ref 4.22–5.81)
RDW: 14.9 % (ref 11.5–15.5)
WBC: 9.8 10*3/uL (ref 4.0–10.5)

## 2017-11-30 LAB — COMPREHENSIVE METABOLIC PANEL
ALT: 15 U/L (ref 0–53)
AST: 22 U/L (ref 0–37)
Albumin: 4.2 g/dL (ref 3.5–5.2)
Alkaline Phosphatase: 121 U/L — ABNORMAL HIGH (ref 39–117)
BUN: 12 mg/dL (ref 6–23)
CO2: 33 mEq/L — ABNORMAL HIGH (ref 19–32)
Calcium: 9.8 mg/dL (ref 8.4–10.5)
Chloride: 98 mEq/L (ref 96–112)
Creatinine, Ser: 0.79 mg/dL (ref 0.40–1.50)
GFR: 124.13 mL/min (ref >60.00)
Glucose, Bld: 88 mg/dL (ref 70–99)
Potassium: 4.6 mEq/L (ref 3.5–5.1)
Sodium: 140 mEq/L (ref 135–145)
Total Bilirubin: 0.5 mg/dL (ref 0.2–1.2)
Total Protein: 8.1 g/dL (ref 6.0–8.3)

## 2017-11-30 LAB — LIPID PANEL
Cholesterol: 164 mg/dL (ref 0–200)
HDL: 65.8 mg/dL (ref >39.00)
LDL Cholesterol: 87 mg/dL (ref 0–99)
NonHDL: 98.54
Total CHOL/HDL Ratio: 2
Triglycerides: 60 mg/dL (ref 0.0–149.0)
VLDL: 12 mg/dL (ref 0.0–40.0)

## 2017-11-30 LAB — TSH: TSH: 1.29 u[IU]/mL (ref 0.35–4.50)

## 2017-11-30 LAB — SEDIMENTATION RATE: Sed Rate: 11 mm/hr (ref 0–20)

## 2017-12-20 ENCOUNTER — Other Ambulatory Visit: Payer: Self-pay

## 2017-12-20 DIAGNOSIS — I70211 Atherosclerosis of native arteries of extremities with intermittent claudication, right leg: Secondary | ICD-10-CM

## 2017-12-20 DIAGNOSIS — I739 Peripheral vascular disease, unspecified: Secondary | ICD-10-CM

## 2018-01-12 ENCOUNTER — Ambulatory Visit (HOSPITAL_COMMUNITY)
Admission: RE | Admit: 2018-01-12 | Discharge: 2018-01-12 | Disposition: A | Discharge status: Home / Self Care | Payer: Medicare HMO | Source: Ambulatory Visit | Attending: Vascular Surgery | Admitting: Vascular Surgery

## 2018-01-12 ENCOUNTER — Encounter: Payer: Self-pay | Admitting: Vascular Surgery

## 2018-01-12 ENCOUNTER — Ambulatory Visit: Payer: Medicare HMO | Admitting: Vascular Surgery

## 2018-01-12 ENCOUNTER — Other Ambulatory Visit: Payer: Self-pay

## 2018-01-12 VITALS — BP 116/70 | HR 87 | Temp 97.8°F | Resp 16 | Ht 72.0 in | Wt 173.0 lb

## 2018-01-12 DIAGNOSIS — I739 Peripheral vascular disease, unspecified: Secondary | ICD-10-CM

## 2018-01-12 DIAGNOSIS — I70211 Atherosclerosis of native arteries of extremities with intermittent claudication, right leg: Secondary | ICD-10-CM | POA: Diagnosis not present

## 2018-01-12 NOTE — Progress Notes (Signed)
Patient name: Brandon Dixon MRN: [id number] DOB: [birth year] Sex: male   REASON FOR CONSULT:    Peripheral vascular disease.  HPI:   Brandon Dixon is a pleasant 72 y.o. male, who I last saw in August 2015.  He was seen at that time with left lower extremity claudication.  We had discussed conservative treatment however he wished to pursue arteriography.  Arteriogram back then showed no significant inflow disease on the left.  He had a left superficial femoral artery occlusion with reconstitution of the above-knee popliteal artery.  It looks like he had a target just above the knee for bypass potentially.  Below that he had occlusion of the below-knee popliteal artery and reconstitution of his anterior tibial artery.  I saw him back after his arteriogram and he just come back from [place] and his symptoms had improved.  He was walking up to a mile a day.  Therefore we continue conservative treatment he was then lost to follow-up.  Since I saw him last he continues to have left calf claudication.  This occurs at about 600-800 feet.  He feels that over the last year his symptoms have gradually gotten better.  He has no symptoms on the right side.  He denies any history of rest pain or nonhealing ulcers.  He is on aspirin and is on a statin.  Past Medical History:  Diagnosis Date  . ALLERGIC RHINITIS 09/26/2007  . BENIGN PROSTATIC HYPERTROPHY 05/27/2007  . COLONIC POLYPS, HX OF 05/27/2007  . DERMATITIS 08/25/2010  . GERD 05/27/2007  . HYPERCHOLESTEROLEMIA 09/26/2007  . Peripheral vascular disease ([place])     Family History  Problem Relation Age of Onset  . Heart disease Mother   . Heart attack Mother   . Cancer Father        lung ca  . Hypertension Sister   . Diabetes Brother   . Hypertension Sister   . Diabetes Brother   . Diabetes Brother   . Diabetes Brother   . Diabetes Brother   . Diabetes Daughter     SOCIAL HISTORY: He quit smoking in 2003. Social History   Socioeconomic  History  . Marital status: Married    Spouse name: Not on file  . Number of children: Not on file  . Years of education: Not on file  . Highest education level: Not on file  Occupational History  . Not on file  Social Needs  . Financial resource strain: Not on file  . Food insecurity:    Worry: Not on file    Inability: Not on file  . Transportation needs:    Medical: Not on file    Non-medical: Not on file  Tobacco Use  . Smoking status: Former Smoker    Last attempt to quit: 10/19/2001    Years since quitting: 16.2  . Smokeless tobacco: Never Used  Substance and Sexual Activity  . Alcohol use: Yes    Alcohol/week: 3.6 oz    Types: 6 Cans of beer per week  . Drug use: No  . Sexual activity: Not on file  Lifestyle  . Physical activity:    Days per week: Not on file    Minutes per session: Not on file  . Stress: Not on file  Relationships  . Social connections:    Talks on phone: Not on file    Gets together: Not on file    Attends religious service: Not on file    Active member of  club or organization: Not on file    Attends meetings of clubs or organizations: Not on file    Relationship status: Not on file  . Intimate partner violence:    Fear of current or ex partner: Not on file    Emotionally abused: Not on file    Physically abused: Not on file    Forced sexual activity: Not on file  Other Topics Concern  . Not on file  Social History Narrative  . Not on file    No Known Allergies  Current Outpatient Medications  Medication Sig Dispense Refill  . aspirin EC 81 MG tablet Take 81 mg by mouth daily.    Marland Kitchen atorvastatin (LIPITOR) 80 MG tablet TAKE 1 TABLET EVERY DAY 90 tablet 3  . Calcium 600-200 MG-UNIT per tablet Take 1 tablet by mouth daily.    Marland Kitchen EPINEPHrine 0.3 mg/0.3 mL IJ SOAJ injection Inject 0.3 mLs (0.3 mg total) into the muscle once. 1 Device 1  . fluticasone (FLONASE) 50 MCG/ACT nasal spray PLACE 2 SPRAYS INTO BOTH NOSTRILS DAILY. 48 g 1  .  predniSONE (DELTASONE) 2.5 MG tablet TAKE 1 TABLET IN THE MORNING  AND TAKE 1/2 TABLET  IN  THE  AFTERNOON (Patient taking differently: TAKE 1 TABLET IN THE MORNING  EVERY OTHER DAY.) 135 tablet 3  . sildenafil (REVATIO) 20 MG tablet 2 tablets daily as needed 90 tablet 2  . tamsulosin (FLOMAX) 0.4 MG CAPS capsule Take 1 capsule (0.4 mg total) by mouth daily after supper. 90 capsule 3  . triamcinolone cream (KENALOG) 0.1 % Apply 1 application topically 2 (two) times daily. 180 g 1   No current facility-administered medications for this visit.     REVIEW OF SYSTEMS:  [X]  denotes positive finding, [ ]  denotes negative finding Cardiac  Comments:  Chest pain or chest pressure:    Shortness of breath upon exertion:    Short of breath when lying flat:    Irregular heart rhythm:        Vascular    Pain in calf, thigh, or hip brought on by ambulation: x   Pain in feet at night that wakes you up from your sleep:     Blood clot in your veins:    Leg swelling:         Pulmonary    Oxygen at home:    Productive cough:     Wheezing:         Neurologic    Sudden weakness in arms or legs:     Sudden numbness in arms or legs:     Sudden onset of difficulty speaking or slurred speech:    Temporary loss of vision in one eye:     Problems with dizziness:         Gastrointestinal    Blood in stool:     Vomited blood:         Genitourinary    Burning when urinating:     Blood in urine:        Psychiatric    Major depression:         Hematologic    Bleeding problems:    Problems with blood clotting too easily:        Skin    Rashes or ulcers:        Constitutional    Fever or chills:     PHYSICAL EXAM:   Vitals:   01/12/18 1448  Weight: 173 lb (78.5 kg)  Height:  6' (1.829 m)    GENERAL: The patient is a well-nourished male, in no acute distress. The vital signs are documented above. CARDIAC: There is a regular rate and rhythm.  VASCULAR: I do not detect carotid bruits. On  the right side he has a palpable femoral, popliteal, dorsalis pedis pulse. On the left side he has a palpable femoral pulse.  I cannot palpate popliteal or pedal pulses. He has no significant lower extremity swelling. He has some hyperpigmentation bilaterally consistent with chronic venous insufficiency. PULMONARY: There is good air exchange bilaterally without wheezing or rales. ABDOMEN: Soft and non-tender with normal pitched bowel sounds.  I do not palpate an abdominal aortic aneurysm. MUSCULOSKELETAL: There are no major deformities or cyanosis. NEUROLOGIC: No focal weakness or paresthesias are detected. SKIN: There are no ulcers or rashes noted. PSYCHIATRIC: The patient has a normal affect.  DATA:    ARTERIAL DOPPLER STUDY: I have independently interpreted his arterial Doppler study today.  On the left side, which is the symptomatic side, there is a monophasic dorsalis pedis and posterior tibial signal.  ABI is 62%.  Toe pressure is 49 mmHg.  On the right side there is a biphasic dorsalis pedis and posterior tibial signal.  ABI is 100%.  Toe pressure is 144 mmHg.   MEDICAL ISSUES:   INFRAINGUINAL ARTERIAL OCCLUSIVE DISEASE: This patient has stable claudication secondary to infrainguinal arterial occlusive disease on the left.  Based on his previous arteriogram in 2015, he was not a candidate for an endovascular approach.  We have discussed continued conservative treatment.  I encouraged him to maintain his structured walking program.  We have also discussed the importance of nutrition.  Fortunately, he is not a smoker.  We also discussed the potential use of cilostazol.  He is agreeable to continue with conservative treatment.  If his symptoms progressed and certainly we can consider arteriography and reevaluate him for a left lower extremity bypass.  Back in 2015 his vein was marginal and we would also probably have to repeat his vein map.  I ordered follow-up ABIs in 1 year and I will see  him back at that time.  He knows to call sooner if he has problems.  Deitra Mayo [facility] [phone]

## 2018-02-10 ENCOUNTER — Ambulatory Visit (INDEPENDENT_AMBULATORY_CARE_PROVIDER_SITE_OTHER): Payer: Medicare HMO | Admitting: Adult Health

## 2018-02-10 ENCOUNTER — Encounter: Payer: Self-pay | Admitting: Adult Health

## 2018-02-10 VITALS — BP 120/70 | Temp 98.4°F | Wt 170.0 lb

## 2018-02-10 DIAGNOSIS — M79675 Pain in left toe(s): Secondary | ICD-10-CM | POA: Diagnosis not present

## 2018-02-10 NOTE — Progress Notes (Signed)
Subjective:    Patient ID: Brandon Dixon, male    DOB: 22-Jun-1946, 72 y.o.   MRN: [id number]  HPI  72 year old male who  has a past medical history of ALLERGIC RHINITIS (09/26/2007), BENIGN PROSTATIC HYPERTROPHY (05/27/2007), COLONIC POLYPS, HX OF (05/27/2007), DERMATITIS (08/25/2010), GERD (05/27/2007), HYPERCHOLESTEROLEMIA (09/26/2007), and Peripheral vascular disease ([name]).  He is a patient of Dr. Raliegh Dixon who I am seeing today for an acute issue of left great toe pain.  Reports pain has been present for approximately 2 weeks, pain is worse sometimes more than others but is worse with weightbearing activity.  Reports no trauma to the right great toe, has not noticed any redness warmth or swelling.  Was out in [place] approximately 2 weeks ago, right before the pain started and walked multiple miles on multiple days.  Review of Systems See HPI   Past Medical History:  Diagnosis Date  . ALLERGIC RHINITIS 09/26/2007  . BENIGN PROSTATIC HYPERTROPHY 05/27/2007  . COLONIC POLYPS, HX OF 05/27/2007  . DERMATITIS 08/25/2010  . GERD 05/27/2007  . HYPERCHOLESTEROLEMIA 09/26/2007  . Peripheral vascular disease ([place])     Social History   Socioeconomic History  . Marital status: Married    Spouse name: Not on file  . Number of children: Not on file  . Years of education: Not on file  . Highest education level: Not on file  Occupational History  . Not on file  Social Needs  . Financial resource strain: Not on file  . Food insecurity:    Worry: Not on file    Inability: Not on file  . Transportation needs:    Medical: Not on file    Non-medical: Not on file  Tobacco Use  . Smoking status: Former Smoker    Last attempt to quit: 10/19/2001    Years since quitting: 16.3  . Smokeless tobacco: Never Used  Substance and Sexual Activity  . Alcohol use: Yes    Alcohol/week: 3.6 oz    Types: 6 Cans of beer per week  . Drug use: No  . Sexual activity: Not on file  Lifestyle  . Physical activity:    Days per  week: Not on file    Minutes per session: Not on file  . Stress: Not on file  Relationships  . Social connections:    Talks on phone: Not on file    Gets together: Not on file    Attends religious service: Not on file    Active member of club or organization: Not on file    Attends meetings of clubs or organizations: Not on file    Relationship status: Not on file  . Intimate partner violence:    Fear of current or ex partner: Not on file    Emotionally abused: Not on file    Physically abused: Not on file    Forced sexual activity: Not on file  Other Topics Concern  . Not on file  Social History Narrative  . Not on file    Past Surgical History:  Procedure Laterality Date  . LOWER EXTREMITY ANGIOGRAM Left 04/30/2014   Procedure: LOWER EXTREMITY ANGIOGRAM;  Surgeon: Angelia Mould, MD;  Location[facility] CATH LAB;  Service: Cardiovascular;  Laterality: Left;  . ROTATOR CUFF REPAIR    . VASECTOMY      Family History  Problem Relation Age of Onset  . Heart disease Mother   . Heart attack Mother   . Cancer Father  lung ca  . Hypertension Sister   . Diabetes Brother   . Hypertension Sister   . Diabetes Brother   . Diabetes Brother   . Diabetes Brother   . Diabetes Brother   . Diabetes Daughter     No Known Allergies  Current Outpatient Medications on File Prior to Visit  Medication Sig Dispense Refill  . aspirin EC 81 MG tablet Take 81 mg by mouth daily.    Marland Kitchen atorvastatin (LIPITOR) 80 MG tablet TAKE 1 TABLET EVERY DAY 90 tablet 3  . Calcium 600-200 MG-UNIT per tablet Take 1 tablet by mouth daily.    Marland Kitchen EPINEPHrine 0.3 mg/0.3 mL IJ SOAJ injection Inject 0.3 mLs (0.3 mg total) into the muscle once. 1 Device 1  . fluticasone (FLONASE) 50 MCG/ACT nasal spray PLACE 2 SPRAYS INTO BOTH NOSTRILS DAILY. 48 g 1  . predniSONE (DELTASONE) 2.5 MG tablet TAKE 1 TABLET IN THE MORNING  AND TAKE 1/2 TABLET  IN  THE  AFTERNOON (Patient taking differently: TAKE 1 TABLET IN THE  MORNING  EVERY OTHER DAY.) 135 tablet 3  . sildenafil (REVATIO) 20 MG tablet 2 tablets daily as needed 90 tablet 2  . tamsulosin (FLOMAX) 0.4 MG CAPS capsule Take 1 capsule (0.4 mg total) by mouth daily after supper. 90 capsule 3  . triamcinolone cream (KENALOG) 0.1 % Apply 1 application topically 2 (two) times daily. 180 g 1   No current facility-administered medications on file prior to visit.     BP 120/70   Temp 98.4 F (36.9 C) (Oral)   Wt 170 lb (77.1 kg)   BMI 23.06 kg/m       Objective:   Physical Exam  Constitutional: He appears well-developed and well-nourished. No distress.  Cardiovascular: Exam reveals no gallop.  Pulmonary/Chest: Effort normal and breath sounds normal.  Skin: Skin is warm and dry. No rash noted. He is not diaphoretic. No erythema. No pallor.  Area of toenail at distal tip that appears to have pulled away from bed of toe.  No foreign body noticed.  No signs of infection.  Negative for gout-like symptoms  Psychiatric: He has a normal mood and affect. His behavior is normal. Judgment and thought content normal.  Nursing note and vitals reviewed.     Assessment & Plan:  1. Great toe pain, left -Advised Motrin for pain relief can take up to 800 mg at a time.  Pain will eventually get worse as his toenail grows out.  Follow-up with any signs of infection  Dorothyann Peng, NP

## 2018-02-25 ENCOUNTER — Encounter: Payer: Self-pay | Admitting: Internal Medicine

## 2018-03-04 ENCOUNTER — Encounter: Payer: Self-pay | Admitting: Internal Medicine

## 2018-03-07 ENCOUNTER — Other Ambulatory Visit: Payer: Self-pay | Admitting: Internal Medicine

## 2018-03-09 ENCOUNTER — Encounter: Payer: Self-pay | Admitting: Internal Medicine

## 2018-03-09 ENCOUNTER — Ambulatory Visit (INDEPENDENT_AMBULATORY_CARE_PROVIDER_SITE_OTHER): Payer: Medicare HMO | Admitting: Internal Medicine

## 2018-03-09 VITALS — BP 90/70 | HR 75 | Temp 98.1°F | Wt 170.0 lb

## 2018-03-09 DIAGNOSIS — Z23 Encounter for immunization: Secondary | ICD-10-CM

## 2018-03-09 DIAGNOSIS — S90851D Superficial foreign body, right foot, subsequent encounter: Secondary | ICD-10-CM | POA: Diagnosis not present

## 2018-03-09 MED ORDER — NAPROXEN 500 MG PO TABS: 500.0000 mg | ORAL_TABLET | Freq: Two times a day (BID) | ORAL | 2 refills | Status: DC

## 2018-03-09 NOTE — Progress Notes (Signed)
Subjective:    Patient ID: Brandon Dixon, male    DOB: 04/23/1946, 72 y.o.   MRN: [id number]  HPI  72 year old patient who has history of PMR.  Prednisone therapy has been down titrated to 2.5 mg every other day 3 months ago when his sedimentation rate was normal.  He states that he has intermittent pain involving the dorsal aspect of his left wrist from a ganglion cyst.  For the past couple weeks he has had increasing pain involving the nondominant left wrist that has intensified over the past 2 days.  He was seen last month for pain involving the left toe which was related to toenail trauma and not joint inflammation.  He states that he sustained a puncture wound to the right foot about 3 weeks ago.  Past Medical History:  Diagnosis Date  . ALLERGIC RHINITIS 09/26/2007  . BENIGN PROSTATIC HYPERTROPHY 05/27/2007  . COLONIC POLYPS, HX OF 05/27/2007  . DERMATITIS 08/25/2010  . GERD 05/27/2007  . HYPERCHOLESTEROLEMIA 09/26/2007  . Peripheral vascular disease ([place])      Social History   Socioeconomic History  . Marital status: Married    Spouse name: Not on file  . Number of children: Not on file  . Years of education: Not on file  . Highest education level: Not on file  Occupational History  . Not on file  Social Needs  . Financial resource strain: Not on file  . Food insecurity:    Worry: Not on file    Inability: Not on file  . Transportation needs:    Medical: Not on file    Non-medical: Not on file  Tobacco Use  . Smoking status: Former Smoker    Last attempt to quit: 10/19/2001    Years since quitting: 16.3  . Smokeless tobacco: Never Used  Substance and Sexual Activity  . Alcohol use: Yes    Alcohol/week: 3.6 oz    Types: 6 Cans of beer per week  . Drug use: No  . Sexual activity: Not on file  Lifestyle  . Physical activity:    Days per week: Not on file    Minutes per session: Not on file  . Stress: Not on file  Relationships  . Social connections:    Talks on  phone: Not on file    Gets together: Not on file    Attends religious service: Not on file    Active member of club or organization: Not on file    Attends meetings of clubs or organizations: Not on file    Relationship status: Not on file  . Intimate partner violence:    Fear of current or ex partner: Not on file    Emotionally abused: Not on file    Physically abused: Not on file    Forced sexual activity: Not on file  Other Topics Concern  . Not on file  Social History Narrative  . Not on file    Past Surgical History:  Procedure Laterality Date  . LOWER EXTREMITY ANGIOGRAM Left 04/30/2014   Procedure: LOWER EXTREMITY ANGIOGRAM;  Surgeon: Angelia Mould, MD;  Location: [facility] CATH LAB;  Service: Cardiovascular;  Laterality: Left;  . ROTATOR CUFF REPAIR    . VASECTOMY      Family History  Problem Relation Age of Onset  . Heart disease Mother   . Heart attack Mother   . Cancer Father        lung ca  . Hypertension Sister   .  Diabetes Brother   . Hypertension Sister   . Diabetes Brother   . Diabetes Brother   . Diabetes Brother   . Diabetes Brother   . Diabetes Daughter     No Known Allergies  Current Outpatient Medications on File Prior to Visit  Medication Sig Dispense Refill  . aspirin EC 81 MG tablet Take 81 mg by mouth daily.    Marland Kitchen atorvastatin (LIPITOR) 80 MG tablet TAKE 1 TABLET EVERY DAY 90 tablet 3  . Calcium 600-200 MG-UNIT per tablet Take 1 tablet by mouth daily.    Marland Kitchen EPINEPHrine 0.3 mg/0.3 mL IJ SOAJ injection Inject 0.3 mLs (0.3 mg total) into the muscle once. 1 Device 1  . fluticasone (FLONASE) 50 MCG/ACT nasal spray PLACE 2 SPRAYS INTO BOTH NOSTRILS DAILY. 48 g 1  . predniSONE (DELTASONE) 2.5 MG tablet TAKE 1 TABLET IN THE MORNING  AND TAKE 1/2 TABLET  IN  THE  AFTERNOON (Patient taking differently: TAKE 1 TABLET IN THE MORNING  EVERY OTHER DAY.) 135 tablet 3  . sildenafil (REVATIO) 20 MG tablet 2 tablets daily as needed 90 tablet 2  . tamsulosin  (FLOMAX) 0.4 MG CAPS capsule Take 1 capsule (0.4 mg total) by mouth daily after supper. 90 capsule 3  . triamcinolone cream (KENALOG) 0.1 % Apply 1 application topically 2 (two) times daily. 180 g 1   No current facility-administered medications on file prior to visit.     BP 90/70 (BP Location: Right Arm, Patient Position: Sitting, Cuff Size: Large)   Pulse 75   Temp 98.1 F (36.7 C) (Oral)   Wt 170 lb (77.1 kg)   SpO2 96%   BMI 23.06 kg/m      Review of Systems     Objective:   Physical Exam  Constitutional: He appears well-developed and well-nourished. No distress.  Afebrile.  No distress  Musculoskeletal:  Tenderness involving the left wrist area with some minimal soft tissue swelling.  No erythema.  Left wrist perhaps was slightly warmer compared to the right maximal tenderness was over the radial aspect of the wrist;  pain aggravated slightly by movement  Skin:  Small healing abrasion without inflammatory aspects involving the plantar aspect of the right first toe near the MTP joint          Assessment & Plan:   Left wrist tendinitis.  Low suspicion for a septic process in spite of recent puncture wound.  Will place in a wrist splint and treat with naproxen  We will update tetanus  Nyoka Cowden

## 2018-03-09 NOTE — Patient Instructions (Signed)
Tendinitis Tendinitis is inflammation of a tendon. A tendon is a strong cord of tissue that connects muscle to bone. Tendinitis can affect any tendon, but it most commonly affects the shoulder tendon (rotator cuff), ankle tendon (Achilles tendon), elbow tendon (triceps tendon), or one of the tendons in the wrist. What are the causes? This condition may be caused by:  Overusing a tendon or muscle. This is common.  Age-related wear and tear.  Injury.  Inflammatory conditions, such as arthritis.  Certain medicines.  What increases the risk? This condition is more likely to develop in people who do activities that involve repetitive motions. What are the signs or symptoms? Symptoms of this condition may include:  Pain.  Tenderness.  Mild swelling.  How is this diagnosed? This condition is diagnosed with a physical exam. You may also have tests, such as:  Ultrasound. This uses sound waves to make an image of your affected area.  MRI.  How is this treated? This condition may be treated by resting, icing, applying pressure (compression), and raising (elevating) the area above the level of your heart. This is known as RICE therapy. Treatment may also include:  Medicines to help reduce inflammation or to help reduce pain.  Exercises or physical therapy to strengthen and stretch the tendon.  A brace or splint.  Surgery (rare).  Follow these instructions at home:  If you have a splint or brace:  Wear the splint or brace as told by your health care provider. Remove it only as told by your health care provider.  Loosen the splint or brace if your fingers or toes tingle, become numb, or turn cold and blue.  Do not take baths, swim, or use a hot tub until your health care provider approves. Ask your health care provider if you can take showers. You may only be allowed to take sponge baths for bathing.  Do not let your splint or brace get wet if it is not waterproof. ? If your  splint or brace is not waterproof, cover it with a watertight plastic bag when you take a bath or a shower.  Keep the splint or brace clean. Managing pain, stiffness, and swelling  If directed, apply ice to the affected area. ? Put ice in a plastic bag. ? Place a towel between your skin and the bag. ? Leave the ice on for 20 minutes, 2-3 times a day.  If directed, apply heat to the affected area as often as told by your health care provider. Use the heat source that your health care provider recommends, such as a moist heat pack or a heating pad. ? Place a towel between your skin and the heat source. ? Leave the heat on for 20-30 minutes. ? Remove the heat if your skin turns bright red. This is especially important if you are unable to feel pain, heat, or cold. You may have a greater risk of getting burned.  Move the fingers or toes of the affected limb often, if this applies. This can help to prevent stiffness and lessen swelling.  If directed, elevate the affected area above the level of your heart while you are sitting or lying down. Driving  Do not drive or operate heavy machinery while taking prescription pain medicine.  Ask your health care provider when it is safe to drive if you have a splint or brace on any part of your arm or leg. Activity  Return to your normal activities as told by your health care   provider. Ask your health care provider what activities are safe for you.  Rest the affected area as told by your health care provider.  Avoid using the affected area while you are experiencing symptoms of tendinitis.  Do exercises as told by your health care provider. General instructions  If you have a splint, do not put pressure on any part of the splint until it is fully hardened. This may take several hours.  Wear an elastic bandage or compression wrap only as told by your health care provider.  Take over-the-counter and prescription medicines only as told by your  health care provider.  Keep all follow-up visits as told by your health care provider. This is important. Contact a health care provider if:  Your symptoms do not improve.  You develop new, unexplained problems, such as numbness in your hands. This information is not intended to replace advice given to you by your health care provider. Make sure you discuss any questions you have with your health care provider. Document Released: 10/02/2000 Document Revised: 06/04/2016 Document Reviewed: 07/08/2015 Elsevier Interactive Patient Education  2018 Elsevier Inc.  

## 2018-03-16 ENCOUNTER — Telehealth: Payer: Self-pay | Admitting: Internal Medicine

## 2018-03-17 ENCOUNTER — Other Ambulatory Visit: Payer: Self-pay

## 2018-03-17 MED ORDER — TAMSULOSIN HCL 0.4 MG PO CAPS: ORAL_CAPSULE | ORAL | 0 refills | Status: DC

## 2018-03-17 NOTE — Telephone Encounter (Signed)
Pt is asking for the rx to be sent to  [facility] ([facility]), [place] [phone] (Phone) [phone] (Fax)    Pt is leaving  [place] go [place] and wants these before he leaves

## 2018-03-17 NOTE — Telephone Encounter (Signed)
Medication sent to [facility] for a 30 day Rx. Pt will call back at the end of the month to send in Rx 90 days to [facility].

## 2018-04-08 ENCOUNTER — Telehealth: Payer: Self-pay | Admitting: Internal Medicine

## 2018-04-08 DIAGNOSIS — S6990XA Unspecified injury of unspecified wrist, hand and finger(s), initial encounter: Secondary | ICD-10-CM

## 2018-04-08 NOTE — Telephone Encounter (Signed)
Copied from [place]. Topic: Quick Communication - See Telephone Encounter >> Apr 08, 2018  8:41 AM Boyd Kerbs wrote: CRM for notification. See Telephone encounter for: 04/08/18.  Pt. Had wrist check and thought it was sprained, but not any better. Asking if can get xrayed or referral

## 2018-04-08 NOTE — Telephone Encounter (Signed)
Okay for orders? Please advise 

## 2018-04-08 NOTE — Telephone Encounter (Signed)
Pt seen 03/09/18, wrist is not getting any better. Requesting an xray or referral to Ortho.  Please advise.

## 2018-04-11 NOTE — Telephone Encounter (Signed)
Okay for orthopedic referral if unimproved today

## 2018-04-12 NOTE — Telephone Encounter (Signed)
Referral placed.

## 2018-04-14 NOTE — Telephone Encounter (Signed)
Pt was referred to [facility] on [address] in [place] - wants a referral sent to [facility]) on [address] in [place] instead.   Please advise Dr Raliegh Ip. Thanks.

## 2018-04-14 NOTE — Telephone Encounter (Signed)
Referral has been updated

## 2018-04-14 NOTE — Telephone Encounter (Addendum)
Relation to pt: self  Call back number:[phone]   Reason for call:  Patient would like to referral placed with [facility] [id number], [place], [place] [phone], patient would like to be advised when referral is placed due to symtopms not improbving, please advise

## 2018-04-14 NOTE — Telephone Encounter (Signed)
Brandon Dixon/Brandon Dixon, can you resend referral to requested location? Thanks!

## 2018-04-18 DIAGNOSIS — H5210 Myopia, unspecified eye: Secondary | ICD-10-CM | POA: Diagnosis not present

## 2018-04-20 DIAGNOSIS — M25532 Pain in left wrist: Secondary | ICD-10-CM | POA: Diagnosis not present

## 2018-05-20 DIAGNOSIS — L218 Other seborrheic dermatitis: Secondary | ICD-10-CM | POA: Diagnosis not present

## 2018-05-27 ENCOUNTER — Other Ambulatory Visit: Payer: Self-pay | Admitting: *Deleted

## 2018-05-27 NOTE — Telephone Encounter (Signed)
Fax refill request received from [facility] for naproxen 500 mg tablets. Med initially rx'd for tendonitis in May. Please advise if okay for refill.

## 2018-05-30 ENCOUNTER — Encounter: Payer: Self-pay | Admitting: Internal Medicine

## 2018-05-30 ENCOUNTER — Ambulatory Visit (INDEPENDENT_AMBULATORY_CARE_PROVIDER_SITE_OTHER): Payer: Medicare HMO | Admitting: Internal Medicine

## 2018-05-30 VITALS — BP 80/60 | HR 81 | Temp 98.5°F | Wt 173.0 lb

## 2018-05-30 DIAGNOSIS — M353 Polymyalgia rheumatica: Secondary | ICD-10-CM | POA: Diagnosis not present

## 2018-05-30 DIAGNOSIS — E7800 Pure hypercholesterolemia, unspecified: Secondary | ICD-10-CM

## 2018-05-30 DIAGNOSIS — I739 Peripheral vascular disease, unspecified: Secondary | ICD-10-CM | POA: Diagnosis not present

## 2018-05-30 LAB — SEDIMENTATION RATE: Sed Rate: 84 mm/hr — ABNORMAL HIGH (ref 0–20)

## 2018-05-30 MED ORDER — NAPROXEN 500 MG PO TABS: 500.0000 mg | ORAL_TABLET | Freq: Two times a day (BID) | ORAL | 2 refills | Status: DC

## 2018-05-30 MED ORDER — TAMSULOSIN HCL 0.4 MG PO CAPS: ORAL_CAPSULE | ORAL | 3 refills | Status: DC

## 2018-05-30 MED ORDER — FLUTICASONE PROPIONATE 50 MCG/ACT NA SUSP: NASAL | 4 refills | Status: DC

## 2018-05-30 MED ORDER — ATORVASTATIN CALCIUM 80 MG PO TABS: 80.0000 mg | ORAL_TABLET | Freq: Every day | ORAL | 3 refills | Status: DC

## 2018-05-30 NOTE — Progress Notes (Signed)
Subjective:    Patient ID: Brandon Dixon, male    DOB: 1946/02/22, 72 y.o.   MRN: [id number]  HPI  72 year old patient who has a history of PAD with stable left leg claudication. This remains stable and he has no pain with usual daily activities.  He does have predictable calf pain after prolonged walking.  This is alleviated by rest within 1 minute he has a history of dyslipidemia and remains on high intensity statin therapy.  he has polymyalgia rheumatica and has done well on a low dose every other day regimen of prednisone.  Past Medical History:  Diagnosis Date  . ALLERGIC RHINITIS 09/26/2007  . BENIGN PROSTATIC HYPERTROPHY 05/27/2007  . COLONIC POLYPS, HX OF 05/27/2007  . DERMATITIS 08/25/2010  . GERD 05/27/2007  . HYPERCHOLESTEROLEMIA 09/26/2007  . Peripheral vascular disease ([place])      Social History   Socioeconomic History  . Marital status: Married    Spouse name: Not on file  . Number of children: Not on file  . Years of education: Not on file  . Highest education level: Not on file  Occupational History  . Not on file  Social Needs  . Financial resource strain: Not on file  . Food insecurity:    Worry: Not on file    Inability: Not on file  . Transportation needs:    Medical: Not on file    Non-medical: Not on file  Tobacco Use  . Smoking status: Former Smoker    Last attempt to quit: 10/19/2001    Years since quitting: 16.6  . Smokeless tobacco: Never Used  Substance and Sexual Activity  . Alcohol use: Yes    Alcohol/week: 6.0 standard drinks    Types: 6 Cans of beer per week  . Drug use: No  . Sexual activity: Not on file  Lifestyle  . Physical activity:    Days per week: Not on file    Minutes per session: Not on file  . Stress: Not on file  Relationships  . Social connections:    Talks on phone: Not on file    Gets together: Not on file    Attends religious service: Not on file    Active member of club or organization: Not on file    Attends meetings  of clubs or organizations: Not on file    Relationship status: Not on file  . Intimate partner violence:    Fear of current or ex partner: Not on file    Emotionally abused: Not on file    Physically abused: Not on file    Forced sexual activity: Not on file  Other Topics Concern  . Not on file  Social History Narrative  . Not on file    Past Surgical History:  Procedure Laterality Date  . LOWER EXTREMITY ANGIOGRAM Left 04/30/2014   Procedure: LOWER EXTREMITY ANGIOGRAM;  Surgeon: Angelia Mould, MD;  Location: [facility] CATH LAB;  Service: Cardiovascular;  Laterality: Left;  . ROTATOR CUFF REPAIR    . VASECTOMY      Family History  Problem Relation Age of Onset  . Heart disease Mother   . Heart attack Mother   . Cancer Father        lung ca  . Hypertension Sister   . Diabetes Brother   . Hypertension Sister   . Diabetes Brother   . Diabetes Brother   . Diabetes Brother   . Diabetes Brother   . Diabetes Daughter  No Known Allergies  Current Outpatient Medications on File Prior to Visit  Medication Sig Dispense Refill  . aspirin EC 81 MG tablet Take 81 mg by mouth daily.    . Calcium 600-200 MG-UNIT per tablet Take 1 tablet by mouth daily.    Marland Kitchen EPINEPHrine 0.3 mg/0.3 mL IJ SOAJ injection Inject 0.3 mLs (0.3 mg total) into the muscle once. 1 Device 1  . naproxen (NAPROSYN) 500 MG tablet Take 1 tablet (500 mg total) by mouth 2 (two) times daily with a meal. 20 tablet 2  . predniSONE (DELTASONE) 2.5 MG tablet TAKE 1 TABLET IN THE MORNING  AND TAKE 1/2 TABLET  IN  THE  AFTERNOON (Patient taking differently: TAKE 1 TABLET IN THE MORNING  EVERY OTHER DAY.) 135 tablet 3  . sildenafil (REVATIO) 20 MG tablet 2 tablets daily as needed 90 tablet 2  . triamcinolone cream (KENALOG) 0.1 % Apply 1 application topically 2 (two) times daily. 180 g 1   No current facility-administered medications on file prior to visit.     BP (!) 80/60 (BP Location: Left Arm, Patient Position:  Sitting, Cuff Size: Normal)   Pulse 81   Temp 98.5 F (36.9 C) (Oral)   Wt 173 lb (78.5 kg)   SpO2 97%   BMI 23.46 kg/m     Review of Systems  Constitutional: Negative for appetite change, chills, fatigue and fever.  HENT: Negative for congestion, dental problem, ear pain, hearing loss, sore throat, tinnitus, trouble swallowing and voice change.   Eyes: Negative for pain, discharge and visual disturbance.  Respiratory: Negative for cough, chest tightness, wheezing and stridor.   Cardiovascular: Negative for chest pain, palpitations and leg swelling.       Stable left leg claudication  Gastrointestinal: Negative for abdominal distention, abdominal pain, blood in stool, constipation, diarrhea, nausea and vomiting.  Genitourinary: Negative for difficulty urinating, discharge, flank pain, genital sores, hematuria and urgency.  Musculoskeletal: Negative for arthralgias, back pain, gait problem, joint swelling, myalgias and neck stiffness.  Skin: Negative for rash.  Neurological: Negative for dizziness, syncope, speech difficulty, weakness, numbness and headaches.  Hematological: Negative for adenopathy. Does not bruise/bleed easily.  Psychiatric/Behavioral: Negative for behavioral problems and dysphoric mood. The patient is not nervous/anxious.        Objective:   Physical Exam  Constitutional: He is oriented to person, place, and time. He appears well-developed. No distress.  HENT:  Head: Normocephalic.  Right Ear: External ear normal.  Left Ear: External ear normal.  Eyes: Conjunctivae and EOM are normal.  Neck: Normal range of motion.  Faint brief  left carotid bruit  Cardiovascular: Normal rate and normal heart sounds.  Left femoral bruit Left pedal pulses absent Intact right dorsalis pedis pulse  Pulmonary/Chest: Breath sounds normal.  Abdominal: Bowel sounds are normal.  Musculoskeletal: Normal range of motion. He exhibits no edema or tenderness.  Neurological: He is  alert and oriented to person, place, and time.  Psychiatric: He has a normal mood and affect. His behavior is normal.          Assessment & Plan:   Left leg claudication.  Remains clinically stable Dyslipidemia continue high intensity statin therapy PMR.  Will check a sed rate.  If this is normal will discontinue prednisone therapy  The patient will follow-up with a new provider in 4 to 6 months  [name]

## 2018-05-30 NOTE — Patient Instructions (Signed)
Return in 6 months for a comprehensive annual evaluation  Limit your sodium (Salt) intake

## 2018-06-03 ENCOUNTER — Telehealth: Payer: Self-pay | Admitting: Family Medicine

## 2018-06-03 NOTE — Telephone Encounter (Signed)
I called the pt and informed him of the message below.  He stated he was not told anything about the Naproxen and wanted to have Dr Burnice Logan review this.  Message sent to Dr Burnice Logan.

## 2018-06-03 NOTE — Telephone Encounter (Signed)
Based on records it seems like he is taking prednisone for polymyalgia rheumatic. I am not sure about naproxen, most likely to treat leg pain/arthralgias.  He can discontinue naproxen if he wants to. For now recommend continuing with prednisone and to follow with PCP if he still would like to discontinue.  Thanks, BJ

## 2018-06-03 NOTE — Telephone Encounter (Signed)
Copied from [place] [id number]. Topic: Quick Communication - See Telephone Encounter >> Jun 03, 2018  2:36 PM Hewitt Shorts wrote: Pt was told that this medication Natroxen was called in for him and he would like to know why he needs to be taking this and also does he need to continue the prednisone  Every other day   Best number [phone]

## 2018-06-06 NOTE — Telephone Encounter (Signed)
I called the pt and informed him of the message below. 

## 2018-06-06 NOTE — Telephone Encounter (Signed)
Patient should now be taken prednisone daily.  Discontinue naproxen

## 2018-07-01 ENCOUNTER — Other Ambulatory Visit: Payer: Self-pay

## 2018-07-04 ENCOUNTER — Ambulatory Visit (INDEPENDENT_AMBULATORY_CARE_PROVIDER_SITE_OTHER): Payer: Medicare HMO | Admitting: Internal Medicine

## 2018-07-04 ENCOUNTER — Other Ambulatory Visit: Payer: Self-pay | Admitting: Internal Medicine

## 2018-07-04 ENCOUNTER — Encounter: Payer: Self-pay | Admitting: Internal Medicine

## 2018-07-04 VITALS — BP 100/70 | HR 68 | Temp 98.0°F | Wt 171.8 lb

## 2018-07-04 DIAGNOSIS — M353 Polymyalgia rheumatica: Secondary | ICD-10-CM

## 2018-07-04 LAB — SEDIMENTATION RATE: Sed Rate: 56 mm/hr — ABNORMAL HIGH (ref 0–20)

## 2018-07-04 MED ORDER — PREDNISONE 2.5 MG PO TABS: ORAL_TABLET | ORAL | 3 refills | Status: DC

## 2018-07-04 NOTE — Progress Notes (Signed)
Subjective:    Patient ID: Brandon Dixon, male    DOB: 12/23/1945, 72 y.o.   MRN: [id number]  HPI  72 year old patient who is seen today for follow-up of PMR.  A number of months ago a sedimentation rate was normal.  Last month a sedimentation rate had increased to 84, and prednisone therapy was increased to 2.5 mg daily rather than every other day.  He actually states that he has noted increasing early morning stiffness since the increase daily dose.  Denies any visual symptoms or other constitutional complaints  Past Medical History:  Diagnosis Date  . ALLERGIC RHINITIS 09/26/2007  . BENIGN PROSTATIC HYPERTROPHY 05/27/2007  . COLONIC POLYPS, HX OF 05/27/2007  . DERMATITIS 08/25/2010  . GERD 05/27/2007  . HYPERCHOLESTEROLEMIA 09/26/2007  . Peripheral vascular disease ([place])      Social History   Socioeconomic History  . Marital status: Married    Spouse name: Not on file  . Number of children: Not on file  . Years of education: Not on file  . Highest education level: Not on file  Occupational History  . Not on file  Social Needs  . Financial resource strain: Not on file  . Food insecurity:    Worry: Not on file    Inability: Not on file  . Transportation needs:    Medical: Not on file    Non-medical: Not on file  Tobacco Use  . Smoking status: Former Smoker    Last attempt to quit: 10/19/2001    Years since quitting: 16.7  . Smokeless tobacco: Never Used  Substance and Sexual Activity  . Alcohol use: Yes    Alcohol/week: 6.0 standard drinks    Types: 6 Cans of beer per week  . Drug use: No  . Sexual activity: Not on file  Lifestyle  . Physical activity:    Days per week: Not on file    Minutes per session: Not on file  . Stress: Not on file  Relationships  . Social connections:    Talks on phone: Not on file    Gets together: Not on file    Attends religious service: Not on file    Active member of club or organization: Not on file    Attends meetings of clubs or  organizations: Not on file    Relationship status: Not on file  . Intimate partner violence:    Fear of current or ex partner: Not on file    Emotionally abused: Not on file    Physically abused: Not on file    Forced sexual activity: Not on file  Other Topics Concern  . Not on file  Social History Narrative  . Not on file    Past Surgical History:  Procedure Laterality Date  . LOWER EXTREMITY ANGIOGRAM Left 04/30/2014   Procedure: LOWER EXTREMITY ANGIOGRAM;  Surgeon: Angelia Mould, MD;  Location: [facility] CATH LAB;  Service: Cardiovascular;  Laterality: Left;  . ROTATOR CUFF REPAIR    . VASECTOMY      Family History  Problem Relation Age of Onset  . Heart disease Mother   . Heart attack Mother   . Cancer Father        lung ca  . Hypertension Sister   . Diabetes Brother   . Hypertension Sister   . Diabetes Brother   . Diabetes Brother   . Diabetes Brother   . Diabetes Brother   . Diabetes Daughter     No Known Allergies  Current Outpatient Medications on File Prior to Visit  Medication Sig Dispense Refill  . aspirin EC 81 MG tablet Take 81 mg by mouth daily.    Marland Kitchen atorvastatin (LIPITOR) 80 MG tablet Take 1 tablet (80 mg total) by mouth daily. 90 tablet 3  . Calcium 600-200 MG-UNIT per tablet Take 1 tablet by mouth daily.    Marland Kitchen EPINEPHrine 0.3 mg/0.3 mL IJ SOAJ injection Inject 0.3 mLs (0.3 mg total) into the muscle once. 1 Device 1  . fluticasone (FLONASE) 50 MCG/ACT nasal spray PLACE 2 SPRAYS INTO BOTH NOSTRILS DAILY. 48 g 4  . predniSONE (DELTASONE) 2.5 MG tablet TAKE 1 TABLET IN THE MORNING  AND TAKE 1/2 TABLET  IN  THE  AFTERNOON (Patient taking differently: TAKE 1 TABLET IN THE MORNING  EVERY OTHER DAY.) 135 tablet 3  . sildenafil (REVATIO) 20 MG tablet 2 tablets daily as needed 90 tablet 2  . tamsulosin (FLOMAX) 0.4 MG CAPS capsule TAKE 1 CAPSULE  DAILY AFTER SUPPER. 90 capsule 3  . triamcinolone cream (KENALOG) 0.1 % Apply 1 application topically 2 (two) times  daily. 180 g 1  . naproxen (NAPROSYN) 500 MG tablet Take 1 tablet (500 mg total) by mouth 2 (two) times daily with a meal. (Patient not taking: Reported on 07/04/2018) 20 tablet 2   No current facility-administered medications on file prior to visit.     BP 100/70 (BP Location: Right Arm, Patient Position: Sitting, Cuff Size: Normal)   Pulse 68   Temp 98 F (36.7 C) (Oral)   Wt 171 lb 12.8 oz (77.9 kg)   SpO2 96%   BMI 23.30 kg/m     Review of Systems  Constitutional: Negative for appetite change, chills, fatigue and fever.  HENT: Negative for congestion, dental problem, ear pain, hearing loss, sore throat, tinnitus, trouble swallowing and voice change.   Eyes: Negative for pain, discharge and visual disturbance.  Respiratory: Negative for cough, chest tightness, wheezing and stridor.   Cardiovascular: Negative for chest pain, palpitations and leg swelling.  Gastrointestinal: Negative for abdominal distention, abdominal pain, blood in stool, constipation, diarrhea, nausea and vomiting.  Genitourinary: Negative for difficulty urinating, discharge, flank pain, genital sores, hematuria and urgency.  Musculoskeletal: Positive for arthralgias. Negative for back pain, gait problem, joint swelling, myalgias and neck stiffness.  Skin: Negative for rash.  Neurological: Negative for dizziness, syncope, speech difficulty, weakness, numbness and headaches.  Hematological: Negative for adenopathy. Does not bruise/bleed easily.  Psychiatric/Behavioral: Negative for behavioral problems and dysphoric mood. The patient is not nervous/anxious.        Objective:   Physical Exam  Constitutional: He appears well-developed and well-nourished. No distress.          Assessment & Plan:   PMR.  Will check a sedimentation rate.  Symptoms have intensified slightly even after dose escalation.  Follow-up 1 to 2 months with new provider  [name]

## 2018-07-04 NOTE — Patient Instructions (Signed)
Polymyalgia Rheumatica Polymyalgia rheumatica (PMR) is an inflammatory disorder that causes aching and stiffness in your muscles and joints. Sometimes, PMR leads to a more dangerous condition (temporal arteritis or giant cell arteritis), which can cause vision loss. What are the causes? The exact cause of PMR is not known. What increases the risk? This condition is more likely to develop in:  Females.  People who are 72 years of age or older.  Caucasians.  What are the signs or symptoms?  Pain and stiffness are the main symptoms of PMR. Symptoms may start slowly or suddenly. The symptoms:  May be worse after inactivity and in the morning.  May affect your: ? Hips, buttocks, and thighs. ? Neck, arms, and shoulders. This can make it hard to raise your arms above your head. ? Hands and wrists.  Other symptoms include:  Fever.  Tiredness.  Weakness.  Decreased appetite. This may lead to weight loss.  How is this diagnosed? This condition is diagnosed with a medical history and physical exam. You may need to see a health care provider who specializes in diseases of the joint, muscles, and bones (rheumatologist). You may also have tests, including:  Blood tests.  X-rays.  How is this treated? PMR usually goes away without treatment, but it may take years for that to happen. In the meantime, your health care provider may recommend low-dose steroids to help manage your symptoms of pain and stiffness. Regular exercise and rest will also help your symptoms. Follow these instructions at home:  Take over-the-counter and prescription medicines only as told by your health care provider.  Make sure to get enough rest and sleep.  Eat a healthy and nutritious diet.  Try to exercise most days of the week. Ask your health care provider what type of exercise is best for you.  Keep all follow-up visits as told by your health are provider. This is important. Contact a health care  provider if:  Your symptoms are not controlled with medicine.  You have side effects from steroids. These may include: ? Weight gain. ? Swelling. ? Insomnia. ? Mood changes. ? Bruising. ? High blood sugar readings, if you have diabetes. ? Higher than normal blood pressure readings, if you monitor your blood pressure. Get help right away if:  You develop symptoms of temporal arteritis, such as: ? A change in vision. ? Severe headache. ? Scalp pain. ? Jaw pain. This information is not intended to replace advice given to you by your health care provider. Make sure you discuss any questions you have with your health care provider. Document Released: 11/12/2004 Document Revised: 03/12/2016 Document Reviewed: 04/17/2015 Elsevier Interactive Patient Education  2018 Elsevier Inc.  

## 2018-07-07 ENCOUNTER — Other Ambulatory Visit: Payer: Self-pay

## 2018-07-12 ENCOUNTER — Telehealth: Payer: Self-pay | Admitting: Internal Medicine

## 2018-07-12 DIAGNOSIS — E7800 Pure hypercholesterolemia, unspecified: Secondary | ICD-10-CM

## 2018-07-25 ENCOUNTER — Ambulatory Visit: Payer: Self-pay | Admitting: *Deleted

## 2018-07-25 NOTE — Telephone Encounter (Signed)
Pt calling to request clarification of instructions on how to take prescription of Prednisone 2.5 mg tab. Pt states when he received his current prescription bottle the instructions read to take one tab in the morning and 1/2 tab in the afternoon. Advised pt that current prescription of prednisone states to take two 2.5mg  tabs in the morning. Notified pt that it appears the pharmacy filled the old prescription on file for the medication. Pt verbalized understanding and states he will take two 2.5mg  tabs in the morning as noted on current prescription.   Reason for Disposition . Caller has medication question only, adult not sick, and triager answers question  Answer Assessment - Initial Assessment Questions 1. SYMPTOMS: "Do you have any symptoms?"     No symptoms 2. SEVERITY: If symptoms are present, ask "Are they mild, moderate or severe?"     n/a  Protocols used: MEDICATION QUESTION CALL-A-AH

## 2018-08-08 NOTE — Telephone Encounter (Signed)
Pt states he is out of medication and was wanting to see if the medication can be re-ordered with [facility].

## 2018-08-09 ENCOUNTER — Other Ambulatory Visit: Payer: Self-pay | Admitting: Family Medicine

## 2018-08-09 ENCOUNTER — Other Ambulatory Visit: Payer: Self-pay | Admitting: *Deleted

## 2018-08-09 DIAGNOSIS — E7800 Pure hypercholesterolemia, unspecified: Secondary | ICD-10-CM

## 2018-08-09 MED ORDER — ATORVASTATIN CALCIUM 80 MG PO TABS: 80.0000 mg | ORAL_TABLET | Freq: Every day | ORAL | 1 refills | Status: DC

## 2018-08-09 NOTE — Telephone Encounter (Signed)
Brandon Dixon, the pt has an appt to establish with you on 11-08-17.  Can you fill?

## 2018-08-10 ENCOUNTER — Other Ambulatory Visit: Payer: Self-pay

## 2018-08-10 DIAGNOSIS — E7800 Pure hypercholesterolemia, unspecified: Secondary | ICD-10-CM

## 2018-08-10 MED ORDER — ATORVASTATIN CALCIUM 80 MG PO TABS: 80.0000 mg | ORAL_TABLET | Freq: Every day | ORAL | 0 refills | Status: DC

## 2018-08-10 NOTE — Telephone Encounter (Signed)
Ok for 90 days  

## 2018-08-10 NOTE — Telephone Encounter (Signed)
Rx refilled. No further action needed!

## 2018-08-10 NOTE — Telephone Encounter (Signed)
Sent to the pharmacy by e-scribe. 

## 2018-09-05 ENCOUNTER — Telehealth: Payer: Self-pay | Admitting: Internal Medicine

## 2018-09-05 NOTE — Telephone Encounter (Signed)
Copied from [place] [id number]. Topic: Quick Communication - Rx Refill/Question >> Sep 05, 2018 11:41 AM Margot Ables wrote: Medication: predniSONE (DELTASONE) 2.5 MG tablet - pt states the RX sent to [facility] is not good - he does not use this pharmacy - he uses [facility] order and needs 90 day supply - they do not charge him a copay for this - pt asking for RX to be sent in  Pt only uses [facility] for acute RXs  Has the patient contacted their pharmacy? Yes - no RX on file at [facility] (last fill was for previous dosage - updated dose was sent to wrong pharmacy 07/04/18)  Preferred Pharmacy (with phone number or street name): [place], [place] [phone] (Phone) [phone] (Fax)

## 2018-09-06 ENCOUNTER — Other Ambulatory Visit: Payer: Self-pay

## 2018-09-06 MED ORDER — PREDNISONE 2.5 MG PO TABS: ORAL_TABLET | ORAL | 3 refills | Status: DC

## 2018-09-06 NOTE — Telephone Encounter (Signed)
Patient has appointment with C Nafziger  11/08/18. Can he review Rx to forward it to [facility] for patient- was written for patient before he left.

## 2018-11-08 ENCOUNTER — Encounter: Payer: Medicare HMO | Admitting: Adult Health

## 2018-11-16 ENCOUNTER — Encounter: Payer: Self-pay | Admitting: Adult Health

## 2018-11-16 ENCOUNTER — Ambulatory Visit (INDEPENDENT_AMBULATORY_CARE_PROVIDER_SITE_OTHER): Payer: Medicare HMO | Admitting: Adult Health

## 2018-11-16 VITALS — BP 118/62 | Temp 98.7°F | Wt 179.0 lb

## 2018-11-16 DIAGNOSIS — Z0000 Encounter for general adult medical examination without abnormal findings: Secondary | ICD-10-CM | POA: Diagnosis not present

## 2018-11-16 DIAGNOSIS — N400 Enlarged prostate without lower urinary tract symptoms: Secondary | ICD-10-CM | POA: Diagnosis not present

## 2018-11-16 DIAGNOSIS — J0141 Acute recurrent pansinusitis: Secondary | ICD-10-CM

## 2018-11-16 DIAGNOSIS — E7800 Pure hypercholesterolemia, unspecified: Secondary | ICD-10-CM

## 2018-11-16 MED ORDER — DOXYCYCLINE HYCLATE 100 MG PO CAPS: 100.0000 mg | ORAL_CAPSULE | Freq: Two times a day (BID) | ORAL | 0 refills | Status: DC

## 2018-11-16 NOTE — Progress Notes (Signed)
Patient presents to clinic today to establish care. He is a pleasant 73 year old who  has a past medical history of ALLERGIC RHINITIS (09/26/2007), BENIGN PROSTATIC HYPERTROPHY (05/27/2007), COLONIC POLYPS, HX OF (05/27/2007), DERMATITIS (08/25/2010), GERD (05/27/2007), HYPERCHOLESTEROLEMIA (09/26/2007), and Peripheral vascular disease ([place]).  He is a patient of Dr. Raliegh Ip who is transferring care to me   Acute Concerns: Establish Care  URI -this has been present for 2 to 3 weeks.  He reports productive cough, sinus pain and pressure, postnasal drip, and rhinorrhea.  He denies fevers or chills.  Does not feel as though symptoms are any better or any worse.  He has not been using any over-the-counter medications  Chronic Issues: Hyperlipidemia - Takes 80 mg lipitor and ASA 81 mg  Lab Results  Component Value Date   CHOL 164 11/29/2017   HDL 65.80 11/29/2017   LDLCALC 87 11/29/2017   LDLDIRECT 193.1 09/22/2006   TRIG 60.0 11/29/2017   CHOLHDL 2 11/29/2017   BPH - Takes Flomax 0.4 - feels well controlled   ED- uses viagra PRN   PVD - left leg-  is followed by vascular surgery yearly.   Health Maintenance: Dental -- Routine Vision -- Routine  Immunizations -- UTd Colonoscopy -- Due in 2021    Past Medical History:  Diagnosis Date  . ALLERGIC RHINITIS 09/26/2007  . BENIGN PROSTATIC HYPERTROPHY 05/27/2007  . COLONIC POLYPS, HX OF 05/27/2007  . DERMATITIS 08/25/2010  . GERD 05/27/2007  . HYPERCHOLESTEROLEMIA 09/26/2007  . Peripheral vascular disease [facility])     Past Surgical History:  Procedure Laterality Date  . LOWER EXTREMITY ANGIOGRAM Left 04/30/2014   Procedure: LOWER EXTREMITY ANGIOGRAM;  Surgeon: Angelia Mould, MD;  Location: [facility] CATH LAB;  Service: Cardiovascular;  Laterality: Left;  . ROTATOR CUFF REPAIR    . VASECTOMY      Current Outpatient Medications on File Prior to Visit  Medication Sig Dispense Refill  . aspirin EC 81 MG tablet Take 81 mg by mouth daily.    Marland Kitchen  atorvastatin (LIPITOR) 80 MG tablet Take 1 tablet (80 mg total) by mouth daily. 90 tablet 0  . Calcium 600-200 MG-UNIT per tablet Take 1 tablet by mouth daily.    Marland Kitchen EPINEPHrine 0.3 mg/0.3 mL IJ SOAJ injection Inject 0.3 mLs (0.3 mg total) into the muscle once. 1 Device 1  . fluticasone (FLONASE) 50 MCG/ACT nasal spray PLACE 2 SPRAYS INTO BOTH NOSTRILS DAILY. 48 g 4  . naproxen (NAPROSYN) 500 MG tablet Take 1 tablet (500 mg total) by mouth 2 (two) times daily with a meal. 20 tablet 2  . predniSONE (DELTASONE) 2.5 MG tablet Take 2 tablets every morning 135 tablet 3  . sildenafil (REVATIO) 20 MG tablet 2 tablets daily as needed 90 tablet 2  . tamsulosin (FLOMAX) 0.4 MG CAPS capsule TAKE 1 CAPSULE  DAILY AFTER SUPPER. 90 capsule 3  . triamcinolone cream (KENALOG) 0.1 % Apply 1 application topically 2 (two) times daily. 180 g 1   No current facility-administered medications on file prior to visit.     No Known Allergies  Family History  Problem Relation Age of Onset  . Heart disease Mother   . Heart attack Mother   . Cancer Father        lung ca  . Hypertension Sister   . Diabetes Brother   . Hypertension Sister   . Diabetes Brother   . Diabetes Brother   . Diabetes Brother   . Diabetes Brother   .  Diabetes Daughter     Social History   Socioeconomic History  . Marital status: Married    Spouse name: Not on file  . Number of children: Not on file  . Years of education: Not on file  . Highest education level: Not on file  Occupational History  . Not on file  Social Needs  . Financial resource strain: Not on file  . Food insecurity:    Worry: Not on file    Inability: Not on file  . Transportation needs:    Medical: Not on file    Non-medical: Not on file  Tobacco Use  . Smoking status: Former Smoker    Last attempt to quit: 10/19/2001    Years since quitting: 17.0  . Smokeless tobacco: Never Used  Substance and Sexual Activity  . Alcohol use: Yes    Alcohol/week: 6.0  standard drinks    Types: 6 Cans of beer per week  . Drug use: No  . Sexual activity: Not on file  Lifestyle  . Physical activity:    Days per week: Not on file    Minutes per session: Not on file  . Stress: Not on file  Relationships  . Social connections:    Talks on phone: Not on file    Gets together: Not on file    Attends religious service: Not on file    Active member of club or organization: Not on file    Attends meetings of clubs or organizations: Not on file    Relationship status: Not on file  . Intimate partner violence:    Fear of current or ex partner: Not on file    Emotionally abused: Not on file    Physically abused: Not on file    Forced sexual activity: Not on file  Other Topics Concern  . Not on file  Social History Narrative  . Not on file    Review of Systems  Constitutional: Negative.   HENT: Positive for congestion and sinus pain. Negative for ear discharge, ear pain and sore throat.   Eyes: Negative.   Respiratory: Positive for cough.   Cardiovascular: Negative.   Gastrointestinal: Negative.   Genitourinary: Negative.   Musculoskeletal: Positive for joint pain (arthritis in left knee).  Skin: Negative.   Neurological: Negative.   Psychiatric/Behavioral: Negative.   All other systems reviewed and are negative.     BP 118/62   Temp 98.7 F (37.1 C)   Wt 179 lb (81.2 kg)   BMI 24.28 kg/m   Physical Exam Vitals signs and nursing note reviewed.  Constitutional:      Appearance: Normal appearance. He is normal weight. He is not ill-appearing.  HENT:     Head: Normocephalic and atraumatic.     Right Ear: Tympanic membrane, ear canal and external ear normal. There is no impacted cerumen.     Left Ear: Tympanic membrane, ear canal and external ear normal. There is no impacted cerumen.     Nose: Congestion and rhinorrhea present.     Right Turbinates: Enlarged and swollen.     Left Turbinates: Enlarged and swollen.     Right Sinus:  Maxillary sinus tenderness and frontal sinus tenderness present.     Left Sinus: Maxillary sinus tenderness and frontal sinus tenderness present.     Mouth/Throat:     Mouth: Mucous membranes are moist.     Pharynx: Oropharyngeal exudate present.  Eyes:     General: No scleral icterus.  Right eye: No discharge.        Left eye: No discharge.     Extraocular Movements: Extraocular movements intact.     Conjunctiva/sclera: Conjunctivae normal.     Pupils: Pupils are equal, round, and reactive to light.  Neck:     Musculoskeletal: Normal range of motion and neck supple.  Cardiovascular:     Rate and Rhythm: Normal rate and regular rhythm.     Pulses: Normal pulses.     Heart sounds: Normal heart sounds.  Pulmonary:     Effort: Pulmonary effort is normal.     Breath sounds: Normal breath sounds.  Abdominal:     General: Abdomen is flat. Bowel sounds are normal.     Palpations: Abdomen is soft.  Musculoskeletal: Normal range of motion.        General: Swelling (trace edema to left knee) and tenderness present. No deformity.  Skin:    General: Skin is warm and dry.     Capillary Refill: Capillary refill takes less than 2 seconds.  Neurological:     General: No focal deficit present.     Mental Status: He is alert and oriented to person, place, and time.  Psychiatric:        Mood and Affect: Mood normal.        Behavior: Behavior normal.        Thought Content: Thought content normal.        Judgment: Judgment normal.     Assessment/Plan:  1. Encounter for medical examination to establish care - Has CPE scheduled in two weeks  - Follow up sooner if needed - Continue to stay active and eat healthy   2. Acute recurrent pansinusitis  - doxycycline (VIBRAMYCIN) 100 MG capsule; Take 1 capsule (100 mg total) by mouth 2 (two) times daily.  Dispense: 14 capsule; Refill: 0  3. Benign prostatic hyperplasia without lower urinary tract symptoms - continue with flomax  4.  HYPERCHOLESTEROLEMIA - continue with statin   Dorothyann Peng

## 2018-11-29 ENCOUNTER — Encounter: Payer: Self-pay | Admitting: Adult Health

## 2018-11-29 ENCOUNTER — Ambulatory Visit (INDEPENDENT_AMBULATORY_CARE_PROVIDER_SITE_OTHER): Payer: Medicare HMO | Admitting: Adult Health

## 2018-11-29 VITALS — BP 118/78 | Temp 98.3°F | Ht 72.0 in | Wt 177.0 lb

## 2018-11-29 DIAGNOSIS — Z122 Encounter for screening for malignant neoplasm of respiratory organs: Secondary | ICD-10-CM

## 2018-11-29 DIAGNOSIS — M353 Polymyalgia rheumatica: Secondary | ICD-10-CM | POA: Diagnosis not present

## 2018-11-29 DIAGNOSIS — I739 Peripheral vascular disease, unspecified: Secondary | ICD-10-CM | POA: Diagnosis not present

## 2018-11-29 DIAGNOSIS — Z0000 Encounter for general adult medical examination without abnormal findings: Secondary | ICD-10-CM

## 2018-11-29 DIAGNOSIS — N400 Enlarged prostate without lower urinary tract symptoms: Secondary | ICD-10-CM | POA: Diagnosis not present

## 2018-11-29 DIAGNOSIS — E7800 Pure hypercholesterolemia, unspecified: Secondary | ICD-10-CM

## 2018-11-29 LAB — COMPREHENSIVE METABOLIC PANEL
ALT: 15 U/L (ref 0–53)
AST: 23 U/L (ref 0–37)
Albumin: 4.1 g/dL (ref 3.5–5.2)
Alkaline Phosphatase: 95 U/L (ref 39–117)
BUN: 16 mg/dL (ref 6–23)
CO2: 31 mEq/L (ref 19–32)
Calcium: 9.5 mg/dL (ref 8.4–10.5)
Chloride: 101 mEq/L (ref 96–112)
Creatinine, Ser: 0.88 mg/dL (ref 0.40–1.50)
GFR: 102.83 mL/min (ref >60.00)
Glucose, Bld: 104 mg/dL — ABNORMAL HIGH (ref 70–99)
Potassium: 4.7 mEq/L (ref 3.5–5.1)
Sodium: 139 mEq/L (ref 135–145)
Total Bilirubin: 0.5 mg/dL (ref 0.2–1.2)
Total Protein: 7.2 g/dL (ref 6.0–8.3)

## 2018-11-29 LAB — CBC WITH DIFFERENTIAL/PLATELET
Basophils Absolute: 0.1 10*3/uL (ref 0.0–0.1)
Basophils Relative: 0.7 % (ref 0.0–3.0)
Eosinophils Absolute: 0.1 10*3/uL (ref 0.0–0.7)
Eosinophils Relative: 1.4 % (ref 0.0–5.0)
HCT: 44.6 % (ref 39.0–52.0)
Hemoglobin: 14.6 g/dL (ref 13.0–17.0)
Lymphocytes Relative: 13.2 % (ref 12.0–46.0)
Lymphs Abs: 1.3 10*3/uL (ref 0.7–4.0)
MCHC: 32.8 g/dL (ref 30.0–36.0)
MCV: 88.2 fl (ref 78.0–100.0)
Monocytes Absolute: 0.4 10*3/uL (ref 0.1–1.0)
Monocytes Relative: 4.5 % (ref 3.0–12.0)
Neutro Abs: 7.9 10*3/uL — ABNORMAL HIGH (ref 1.4–7.7)
Neutrophils Relative %: 80.2 % — ABNORMAL HIGH (ref 43.0–77.0)
Platelets: 237 10*3/uL (ref 150.0–400.0)
RBC: 5.05 Mil/uL (ref 4.22–5.81)
RDW: 14.8 % (ref 11.5–15.5)
WBC: 9.9 10*3/uL (ref 4.0–10.5)

## 2018-11-29 LAB — LIPID PANEL
Cholesterol: 158 mg/dL (ref 0–200)
HDL: 64.2 mg/dL (ref >39.00)
LDL Cholesterol: 80 mg/dL (ref 0–99)
NonHDL: 93.96
Total CHOL/HDL Ratio: 2
Triglycerides: 69 mg/dL (ref 0.0–149.0)
VLDL: 13.8 mg/dL (ref 0.0–40.0)

## 2018-11-29 LAB — TSH: TSH: 1.18 u[IU]/mL (ref 0.35–4.50)

## 2018-11-29 LAB — SEDIMENTATION RATE: Sed Rate: 48 mm/hr — ABNORMAL HIGH (ref 0–20)

## 2018-11-29 LAB — PSA: PSA: 0.66 ng/mL (ref 0.10–4.00)

## 2018-11-29 MED ORDER — TAMSULOSIN HCL 0.4 MG PO CAPS: ORAL_CAPSULE | ORAL | 3 refills | Status: DC

## 2018-11-29 MED ORDER — PREDNISONE 2.5 MG PO TABS: ORAL_TABLET | ORAL | 3 refills | Status: DC

## 2018-11-29 MED ORDER — ATORVASTATIN CALCIUM 80 MG PO TABS: 80.0000 mg | ORAL_TABLET | Freq: Every day | ORAL | 3 refills | Status: DC

## 2018-11-29 MED ORDER — ATORVASTATIN CALCIUM 80 MG PO TABS: 80.0000 mg | ORAL_TABLET | Freq: Every day | ORAL | 0 refills | Status: DC

## 2018-11-29 NOTE — Progress Notes (Signed)
Subjective:    Patient ID: Brandon Dixon, male    DOB: [birth year], 73 y.o.   MRN: [id number]  HPI Patient presents for yearly preventative medicine examination. He is a pleasant 73 year old male who  has a past medical history of ALLERGIC RHINITIS (09/26/2007), BENIGN PROSTATIC HYPERTROPHY (05/27/2007), COLONIC POLYPS, HX OF (05/27/2007), DERMATITIS (08/25/2010), GERD (05/27/2007), HYPERCHOLESTEROLEMIA (09/26/2007), and Peripheral vascular disease ([place]).  Hyperlipidemia-prescribed Lipitor 80 mg in a daily 81 mg aspirin.  He denies myalgias.  Last lipid panel within normal limits Lab Results  Component Value Date   CHOL 164 11/29/2017   HDL 65.80 11/29/2017   LDLCALC 87 11/29/2017   LDLDIRECT 193.1 09/22/2006   TRIG 60.0 11/29/2017   CHOLHDL 2 11/29/2017   BPH -Flomax 0.4 mg daily.  Feels well controlled.  ED -uses Viagra as needed  PVD -in his left leg, is followed by vascular surgery on a yearly basis  Polymyalgia Rheumatica - is currently prescribed prednisone 5 mg daily. He denies symptoms and would like to cut back on dose if possible .  Former smoker - has never had low dose CT done   All immunizations and health maintenance protocols were reviewed with the patient and needed orders were placed - vaccinations are up to date   Appropriate screening laboratory values were ordered for the patient including screening of hyperlipidemia, renal function and hepatic function. If indicated by BPH, a PSA was ordered.  Medication reconciliation,  past medical history, social history, problem list and allergies were reviewed in detail with the patient  Goals were established with regard to weight loss, exercise, and  diet in compliance with medications  End of life planning was discussed. He has an advanced directive and living will.   He is up-to-date on routine screening colonoscopy and participates in routine dental and vision screens   Review of Systems  Constitutional: Negative.     HENT: Negative.   Eyes: Negative.   Respiratory: Negative.   Cardiovascular: Negative.   Gastrointestinal: Negative.   Endocrine: Negative.   Genitourinary: Negative.   Musculoskeletal: Negative.   Skin: Negative.   Allergic/Immunologic: Negative.   Neurological: Negative.   Hematological: Negative.   Psychiatric/Behavioral: Negative.   All other systems reviewed and are negative.  Past Medical History:  Diagnosis Date  . ALLERGIC RHINITIS 09/26/2007  . BENIGN PROSTATIC HYPERTROPHY 05/27/2007  . COLONIC POLYPS, HX OF 05/27/2007  . DERMATITIS 08/25/2010  . GERD 05/27/2007  . HYPERCHOLESTEROLEMIA 09/26/2007  . Peripheral vascular disease ([place])     Social History   Socioeconomic History  . Marital status: Married    Spouse name: Not on file  . Number of children: Not on file  . Years of education: Not on file  . Highest education level: Not on file  Occupational History  . Not on file  Social Needs  . Financial resource strain: Not on file  . Food insecurity:    Worry: Not on file    Inability: Not on file  . Transportation needs:    Medical: Not on file    Non-medical: Not on file  Tobacco Use  . Smoking status: Former Smoker    Last attempt to quit: 10/19/2001    Years since quitting: 17.1  . Smokeless tobacco: Never Used  Substance and Sexual Activity  . Alcohol use: Yes    Alcohol/week: 6.0 standard drinks    Types: 6 Cans of beer per week  . Drug use: No  . Sexual activity: Not  on file  Lifestyle  . Physical activity:    Days per week: Not on file    Minutes per session: Not on file  . Stress: Not on file  Relationships  . Social connections:    Talks on phone: Not on file    Gets together: Not on file    Attends religious service: Not on file    Active member of club or organization: Not on file    Attends meetings of clubs or organizations: Not on file    Relationship status: Not on file  . Intimate partner violence:    Fear of current or ex partner:  Not on file    Emotionally abused: Not on file    Physically abused: Not on file    Forced sexual activity: Not on file  Other Topics Concern  . Not on file  Social History Narrative   Works as a Physicist, medical    Married    Three daughters     Past Surgical History:  Procedure Laterality Date  . LOWER EXTREMITY ANGIOGRAM Left 04/30/2014   Procedure: LOWER EXTREMITY ANGIOGRAM;  Surgeon: Angelia Mould, MD;  Location: [facility] CATH LAB;  Service: Cardiovascular;  Laterality: Left;  . ROTATOR CUFF REPAIR    . VASECTOMY      Family History  Problem Relation Age of Onset  . Heart disease Mother   . Heart attack Mother        58   . Cancer Father        lung ca  . Hypertension Sister   . Diabetes Brother   . Hypertension Sister   . Diabetes Brother   . Diabetes Brother   . Diabetes Brother   . Diabetes Brother   . Diabetes Daughter     No Known Allergies  Current Outpatient Medications on File Prior to Visit  Medication Sig Dispense Refill  . aspirin EC 81 MG tablet Take 81 mg by mouth daily.    . Calcium 600-200 MG-UNIT per tablet Take 1 tablet by mouth daily.    Marland Kitchen EPINEPHrine 0.3 mg/0.3 mL IJ SOAJ injection Inject 0.3 mLs (0.3 mg total) into the muscle once. 1 Device 1  . fluticasone (FLONASE) 50 MCG/ACT nasal spray PLACE 2 SPRAYS INTO BOTH NOSTRILS DAILY. 48 g 4  . naproxen (NAPROSYN) 500 MG tablet Take 1 tablet (500 mg total) by mouth 2 (two) times daily with a meal. 20 tablet 2  . sildenafil (REVATIO) 20 MG tablet 2 tablets daily as needed 90 tablet 2  . triamcinolone cream (KENALOG) 0.1 % Apply 1 application topically 2 (two) times daily. 180 g 1   No current facility-administered medications on file prior to visit.     BP 118/78   Temp 98.3 F (36.8 C)   Ht 6' (1.829 m)   Wt 177 lb (80.3 kg)   BMI 24.01 kg/m       Objective:   Physical Exam Vitals signs and nursing note reviewed.  Constitutional:      General: He is not in acute distress.     Appearance: Normal appearance. He is well-developed and normal weight. He is not diaphoretic.  HENT:     Head: Normocephalic and atraumatic.     Right Ear: Tympanic membrane, ear canal and external ear normal. There is no impacted cerumen.     Left Ear: Tympanic membrane, ear canal and external ear normal.     Nose: Nose normal. No congestion or rhinorrhea.  Mouth/Throat:     Mouth: Mucous membranes are dry.     Pharynx: Oropharynx is clear. No oropharyngeal exudate or posterior oropharyngeal erythema.  Eyes:     General:        Right eye: No discharge.        Left eye: No discharge.     Conjunctiva/sclera: Conjunctivae normal.     Pupils: Pupils are equal, round, and reactive to light.  Neck:     Thyroid: No thyromegaly.     Trachea: No tracheal deviation.  Cardiovascular:     Rate and Rhythm: Normal rate and regular rhythm.     Pulses:          Dorsalis pedis pulses are 1+ on the left side.       Posterior tibial pulses are 1+ on the left side.     Heart sounds: Normal heart sounds. No murmur. No friction rub. No gallop.   Pulmonary:     Effort: Pulmonary effort is normal. No respiratory distress.     Breath sounds: Normal breath sounds. No stridor. No wheezing, rhonchi or rales.  Chest:     Chest wall: No tenderness.  Abdominal:     General: Bowel sounds are normal. There is no distension.     Palpations: Abdomen is soft. There is no mass.     Tenderness: There is no abdominal tenderness. There is no right CVA tenderness, left CVA tenderness, guarding or rebound.     Hernia: No hernia is present.  Musculoskeletal: Normal range of motion.  Feet:     Left foot:     Skin integrity: Skin integrity normal.  Lymphadenopathy:     Cervical: No cervical adenopathy.  Skin:    General: Skin is warm and dry.     Coloration: Skin is not pale.     Findings: No erythema or rash.  Neurological:     General: No focal deficit present.     Mental Status: He is alert and oriented to  person, place, and time. Mental status is at baseline.     Cranial Nerves: No cranial nerve deficit.     Coordination: Coordination normal.  Psychiatric:        Thought Content: Thought content normal.        Judgment: Judgment normal.       Assessment & Plan:  1. Routine general medical examination at a health care facility - Continue to stay active and eat healthy - Follow up in one year or sooner if needed - CBC with Differential/Platelet - Comprehensive metabolic panel - Lipid panel - TSH  2. Benign prostatic hyperplasia without lower urinary tract symptoms - well controlled. No change in medications  - PSA - tamsulosin (FLOMAX) 0.4 MG CAPS capsule; TAKE 1 CAPSULE  DAILY AFTER SUPPER.  Dispense: 90 capsule; Refill: 3  3. HYPERCHOLESTEROLEMIA Continue with lipitor and asa  - CBC with Differential/Platelet - Comprehensive metabolic panel - Lipid panel - TSH - atorvastatin (LIPITOR) 80 MG tablet; Take 1 tablet (80 mg total) by mouth daily.  Dispense: 90 tablet; Refill: 3  4. PVD (peripheral vascular disease) ([place]) - Continue follow up with vascular surgery  - CBC with Differential/Platelet - Comprehensive metabolic panel - Lipid panel - TSH  5. Polymyalgia rheumatica ([place]) - I am ok with him cutting back to 2.5 mg daily to see hoe he does  - Sedimentation Rate - predniSONE (DELTASONE) 2.5 MG tablet; Take 2 tablets every morning  Dispense: 135 tablet; Refill: 3  6. Encounter for screening for malignant neoplasm of respiratory organs  - CT CHEST LUNG CA SCREEN LOW DOSE W/O CM; Future   Dorothyann Peng, NP

## 2018-12-01 ENCOUNTER — Other Ambulatory Visit: Payer: Self-pay | Admitting: Adult Health

## 2018-12-01 NOTE — Progress Notes (Unsigned)
Referral placed for Low Dose CT scan for Lung cancer Screening. Referral must be placed to Lung Cancer Screening not Radiology or Pulmonary.   Spoke with Tommi Rumps and explained reasoning behind placing new order. Doroteo Glassman, RN (Pulmonary LCS) aware that we are sending over new order.   ---  Spoke with Langley Gauss again and we figured out that he does not qualify for the program d/t his smoking history -- quit greater than 15 years ago.   Pt will just get a LD CT scan -- will have to figure out if [facility] will pay for this, a lot of time they will deny coverage if not being seen through the program.   Tommi Rumps states that he is going to reach out to the patient to see what he wants to do as far as the scan goes.   Will hold off on ordering the CT until we know from the patient what he wants to do.   Varney Daily, CMA

## 2018-12-08 ENCOUNTER — Telehealth: Payer: Self-pay | Admitting: Adult Health

## 2018-12-08 NOTE — Telephone Encounter (Signed)
Referral Notes  Number of Notes: 2  Type Date User Summary Attachment  General 12/01/2018 11:06 AM Christie Beckers, RN - -  Note   Spoke with Varney Daily, CMA at [facility] office to clarify referral for this pt. I advised her that due to pt quitting smoking in 2003 he would not qualify for lung cancer screening.  She will let the provider know.  Referral cancelled.

## 2018-12-08 NOTE — Telephone Encounter (Signed)
Pt notified that screen was cancelled.  Nothing further needed.

## 2018-12-08 NOTE — Telephone Encounter (Signed)
Copied from [place] [id number]. Topic: Quick Communication - See Telephone Encounter >> Dec 08, 2018  8:41 AM Rayann Heman wrote: CRM for notification. See Telephone encounter for: 12/08/18. Pt called and states that no one has called about lab or upcoming CT. Pt would like a call back from the nurse regarding. Please advise.

## 2019-05-19 ENCOUNTER — Telehealth: Payer: Self-pay | Admitting: Adult Health

## 2019-05-19 NOTE — Telephone Encounter (Signed)
Patient states Dr Raliegh Ip used to do a Sed rate test periodically for his Prednisone dosage, however he has not had one since February.  He is requesting to get this done.  Please call cell phone number first.

## 2019-05-23 ENCOUNTER — Other Ambulatory Visit: Payer: Self-pay | Admitting: Adult Health

## 2019-05-23 DIAGNOSIS — M353 Polymyalgia rheumatica: Secondary | ICD-10-CM

## 2019-05-23 NOTE — Telephone Encounter (Signed)
Order entered

## 2019-05-23 NOTE — Telephone Encounter (Signed)
Spoke to the pt and scheduled him for lab visit on 05/24/2019.  Gave # to call when he arrives to the parking lot.  Nothing further needed.

## 2019-05-24 ENCOUNTER — Other Ambulatory Visit: Payer: Self-pay

## 2019-05-24 ENCOUNTER — Other Ambulatory Visit (INDEPENDENT_AMBULATORY_CARE_PROVIDER_SITE_OTHER): Payer: Medicare HMO

## 2019-05-24 DIAGNOSIS — M353 Polymyalgia rheumatica: Secondary | ICD-10-CM | POA: Diagnosis not present

## 2019-05-24 LAB — SEDIMENTATION RATE: Sed Rate: 30 mm/hr — ABNORMAL HIGH (ref 0–20)

## 2019-06-06 DIAGNOSIS — H5210 Myopia, unspecified eye: Secondary | ICD-10-CM | POA: Diagnosis not present

## 2019-06-21 ENCOUNTER — Encounter: Payer: Self-pay | Admitting: Adult Health

## 2019-06-22 ENCOUNTER — Other Ambulatory Visit: Payer: Self-pay

## 2019-06-22 ENCOUNTER — Telehealth (INDEPENDENT_AMBULATORY_CARE_PROVIDER_SITE_OTHER): Payer: Medicare HMO | Admitting: Adult Health

## 2019-06-22 DIAGNOSIS — R61 Generalized hyperhidrosis: Secondary | ICD-10-CM | POA: Diagnosis not present

## 2019-06-22 NOTE — Progress Notes (Signed)
Virtual Visit via Video Note  I connected with Brandon Dixon on 06/22/19 at  3:30 PM EDT by a video enabled telemedicine application and verified that I am speaking with the correct person using two identifiers.  Location patient: home Location provider:work or home office Persons participating in the virtual visit: patient, provider  I discussed the limitations of evaluation and management by telemedicine and the availability of in person appointments. The patient expressed understanding and agreed to proceed.   HPI: 73 year old male who is being evaluated today for excessive sweating.  He reports that his symptoms started out 6 months ago.  Reports that when he goes outside he is has excessive sweating with very little exertion.  There have been days when he has had to change clothes multiple times.  He denies excessive sweating when he is in his home.  Feels as though this is throughout his body but especially apparent in his underarms.  He denies night sweats, fevers, chills, shortness of breath, or chest pain.  His appetite is good and has not had any weight loss.  Shortly about the same time he reports trouble sleeping.  He is able to fall asleep but then when he wakes up he is unable to go back to sleep.  He denies nocturia.  He denies increased urination but does endorse increased thirst.   ROS: See pertinent positives and negatives per HPI.  Past Medical History:  Diagnosis Date  . ALLERGIC RHINITIS 09/26/2007  . BENIGN PROSTATIC HYPERTROPHY 05/27/2007  . COLONIC POLYPS, HX OF 05/27/2007  . DERMATITIS 08/25/2010  . GERD 05/27/2007  . HYPERCHOLESTEROLEMIA 09/26/2007  . Peripheral vascular disease [facility])     Past Surgical History:  Procedure Laterality Date  . LOWER EXTREMITY ANGIOGRAM Left 04/30/2014   Procedure: LOWER EXTREMITY ANGIOGRAM;  Surgeon: Angelia Mould, MD;  Location[facility] CATH LAB;  Service: Cardiovascular;  Laterality: Left;  . ROTATOR CUFF REPAIR    . VASECTOMY       Family History  Problem Relation Age of Onset  . Heart disease Mother   . Heart attack Mother        34   . Cancer Father        lung ca  . Hypertension Sister   . Diabetes Brother   . Hypertension Sister   . Diabetes Brother   . Diabetes Brother   . Diabetes Brother   . Diabetes Brother   . Diabetes Daughter      Current Outpatient Medications:  .  aspirin EC 81 MG tablet, Take 81 mg by mouth daily., Disp: , Rfl:  .  atorvastatin (LIPITOR) 80 MG tablet, Take 1 tablet (80 mg total) by mouth daily., Disp: 90 tablet, Rfl: 3 .  Calcium 600-200 MG-UNIT per tablet, Take 1 tablet by mouth daily., Disp: , Rfl:  .  EPINEPHrine 0.3 mg/0.3 mL IJ SOAJ injection, Inject 0.3 mLs (0.3 mg total) into the muscle once., Disp: 1 Device, Rfl: 1 .  fluticasone (FLONASE) 50 MCG/ACT nasal spray, PLACE 2 SPRAYS INTO BOTH NOSTRILS DAILY., Disp: 48 g, Rfl: 4 .  naproxen (NAPROSYN) 500 MG tablet, Take 1 tablet (500 mg total) by mouth 2 (two) times daily with a meal., Disp: 20 tablet, Rfl: 2 .  predniSONE (DELTASONE) 2.5 MG tablet, Take 2 tablets every morning, Disp: 135 tablet, Rfl: 3 .  sildenafil (REVATIO) 20 MG tablet, 2 tablets daily as needed, Disp: 90 tablet, Rfl: 2 .  tamsulosin (FLOMAX) 0.4 MG CAPS capsule, TAKE 1 CAPSULE  DAILY AFTER SUPPER., Disp: 90 capsule, Rfl: 3 .  triamcinolone cream (KENALOG) 0.1 %, Apply 1 application topically 2 (two) times daily., Disp: 180 g, Rfl: 1  EXAM:  VITALS per patient if applicable:  GENERAL: alert, oriented, appears well and in no acute distress  HEENT: atraumatic, conjunttiva clear, no obvious abnormalities on inspection of external nose and ears  NECK: normal movements of the head and neck  LUNGS: on inspection no signs of respiratory distress, breathing rate appears normal, no obvious gross SOB, gasping or wheezing  CV: no obvious cyanosis  MS: moves all visible extremities without noticeable abnormality  PSYCH/NEURO: pleasant and  cooperative, no obvious depression or anxiety, speech and thought processing grossly intact  ASSESSMENT AND PLAN:  Discussed the following assessment and plan:  1. Hyperhidrosis -He is on 5 mg of prednisone daily for polymyalgia rheumatica.  Will check labs to rule out diabetes thyroid disease, or infection.  Can consider CT in the future. - CBC with Differential/Platelet; Future - TSH; Future - HIV Antibody (routine testing w rflx); Future - CMP; Future - C-reactive Protein; Future  I discussed the assessment and treatment plan with the patient. The patient was provided an opportunity to ask questions and all were answered. The patient agreed with the plan and demonstrated an understanding of the instructions.   The patient was advised to call back or seek an in-person evaluation if the symptoms worsen or if the condition fails to improve as anticipated.   Dorothyann Peng, NP

## 2019-06-23 ENCOUNTER — Other Ambulatory Visit: Payer: Self-pay

## 2019-06-23 ENCOUNTER — Other Ambulatory Visit (INDEPENDENT_AMBULATORY_CARE_PROVIDER_SITE_OTHER): Payer: Medicare HMO

## 2019-06-23 DIAGNOSIS — R61 Generalized hyperhidrosis: Secondary | ICD-10-CM | POA: Diagnosis not present

## 2019-06-23 LAB — CBC WITH DIFFERENTIAL/PLATELET
Basophils Absolute: 0 10*3/uL (ref 0.0–0.1)
Basophils Relative: 0.5 % (ref 0.0–3.0)
Eosinophils Absolute: 0.2 10*3/uL (ref 0.0–0.7)
Eosinophils Relative: 2.7 % (ref 0.0–5.0)
HCT: 42.6 % (ref 39.0–52.0)
Hemoglobin: 14.1 g/dL (ref 13.0–17.0)
Lymphocytes Relative: 22.3 % (ref 12.0–46.0)
Lymphs Abs: 2 10*3/uL (ref 0.7–4.0)
MCHC: 33.1 g/dL (ref 30.0–36.0)
MCV: 88.7 fl (ref 78.0–100.0)
Monocytes Absolute: 0.6 10*3/uL (ref 0.1–1.0)
Monocytes Relative: 7.3 % (ref 3.0–12.0)
Neutro Abs: 5.9 10*3/uL (ref 1.4–7.7)
Neutrophils Relative %: 67.2 % (ref 43.0–77.0)
Platelets: 225 10*3/uL (ref 150.0–400.0)
RBC: 4.8 Mil/uL (ref 4.22–5.81)
RDW: 15.1 % (ref 11.5–15.5)
WBC: 8.8 10*3/uL (ref 4.0–10.5)

## 2019-06-23 LAB — COMPREHENSIVE METABOLIC PANEL
ALT: 15 U/L (ref 0–53)
AST: 22 U/L (ref 0–37)
Albumin: 4.1 g/dL (ref 3.5–5.2)
Alkaline Phosphatase: 107 U/L (ref 39–117)
BUN: 12 mg/dL (ref 6–23)
CO2: 30 mEq/L (ref 19–32)
Calcium: 9.6 mg/dL (ref 8.4–10.5)
Chloride: 102 mEq/L (ref 96–112)
Creatinine, Ser: 0.89 mg/dL (ref 0.40–1.50)
GFR: 101.33 mL/min (ref >60.00)
Glucose, Bld: 88 mg/dL (ref 70–99)
Potassium: 4 mEq/L (ref 3.5–5.1)
Sodium: 140 mEq/L (ref 135–145)
Total Bilirubin: 0.4 mg/dL (ref 0.2–1.2)
Total Protein: 7.4 g/dL (ref 6.0–8.3)

## 2019-06-23 LAB — C-REACTIVE PROTEIN: CRP: <1 mg/dL (ref 0.5–20.0)

## 2019-06-23 LAB — TSH: TSH: 0.88 u[IU]/mL (ref 0.35–4.50)

## 2019-06-24 LAB — HIV ANTIBODY (ROUTINE TESTING W REFLEX): HIV 1&2 Ab, 4th Generation: NONREACTIVE

## 2019-06-27 ENCOUNTER — Telehealth: Payer: Self-pay | Admitting: Family Medicine

## 2019-06-27 NOTE — Telephone Encounter (Signed)
Pt notified that his lab work is normal and the next step would be a CT scan of his abdomen and chest.  Pt will call his insurance to see if any money will be required before the test is performed and how much it will cost.  Pt states he will call back if he wants to proceed.  Nothing further needed.

## 2019-06-27 NOTE — Telephone Encounter (Unsigned)
Copied from [place] [id number]. Topic: General - Other >> Jun 27, 2019 12:52 PM Rayann Heman wrote: Reason for CRM: pt called and stated that he missed a call regarding labs. Please call back.

## 2019-06-29 ENCOUNTER — Other Ambulatory Visit: Payer: Self-pay | Admitting: Adult Health

## 2019-06-29 DIAGNOSIS — G459 Transient cerebral ischemic attack, unspecified: Secondary | ICD-10-CM

## 2019-06-29 NOTE — Telephone Encounter (Signed)
Spoke to the pt.  He would like for ultrasound to be ordered for his carotid arteries.  Please place order.

## 2019-06-29 NOTE — Telephone Encounter (Signed)
Pt stated unless its necessary for him to have the CT he want to wait b/c of the money he has to pay upfront. Please call pt to advise

## 2019-06-29 NOTE — Telephone Encounter (Signed)
I am ok with waiting.   I just got a letter from his eye doctor though and he would like me to schedule an ultrasound of his carotid arteries in his neck do to his vision problems. This will look for blockages in his arteries.   Is he ok with this?

## 2019-07-17 ENCOUNTER — Telehealth: Payer: Self-pay | Admitting: Adult Health

## 2019-07-17 NOTE — Telephone Encounter (Signed)
Per  Solmon Ice this  order for  US Carotid Duplex Bilateral need to be changed to VAS [id number] thank you

## 2019-07-18 ENCOUNTER — Other Ambulatory Visit: Payer: Self-pay | Admitting: Adult Health

## 2019-07-18 DIAGNOSIS — G459 Transient cerebral ischemic attack, unspecified: Secondary | ICD-10-CM

## 2019-07-18 NOTE — Telephone Encounter (Signed)
Order fixed.  

## 2019-07-21 ENCOUNTER — Ambulatory Visit (HOSPITAL_COMMUNITY)
Admission: RE | Admit: 2019-07-21 | Discharge: 2019-07-21 | Disposition: A | Discharge status: Home / Self Care | Payer: Medicare HMO | Source: Ambulatory Visit | Attending: Cardiology | Admitting: Cardiology

## 2019-07-21 ENCOUNTER — Other Ambulatory Visit: Payer: Self-pay

## 2019-07-21 DIAGNOSIS — G459 Transient cerebral ischemic attack, unspecified: Secondary | ICD-10-CM | POA: Insufficient documentation

## 2019-11-13 ENCOUNTER — Other Ambulatory Visit: Payer: Self-pay

## 2019-11-13 ENCOUNTER — Ambulatory Visit (INDEPENDENT_AMBULATORY_CARE_PROVIDER_SITE_OTHER): Payer: Medicare HMO | Admitting: Family Medicine

## 2019-11-13 ENCOUNTER — Encounter: Payer: Self-pay | Admitting: Family Medicine

## 2019-11-13 VITALS — BP 136/82 | HR 79 | Temp 98.0°F | Ht 72.0 in | Wt 177.0 lb

## 2019-11-13 DIAGNOSIS — R292 Abnormal reflex: Secondary | ICD-10-CM

## 2019-11-13 DIAGNOSIS — M5416 Radiculopathy, lumbar region: Secondary | ICD-10-CM

## 2019-11-13 MED ORDER — PREDNISONE 10 MG PO TABS: ORAL_TABLET | ORAL | 0 refills | Status: DC

## 2019-11-13 MED ORDER — METHOCARBAMOL 500 MG PO TABS: 500.0000 mg | ORAL_TABLET | Freq: Three times a day (TID) | ORAL | 0 refills | Status: DC | PRN

## 2019-11-13 NOTE — Progress Notes (Signed)
Subjective:     Patient ID: Brandon Dixon, male   DOB: [birth year], 74 y.o.   MRN: [id number]  HPI Brandon Dixon seen with low back pain with radiation down the right lower extremity.  Onset Saturday.  He was starting a chain saw when he felt a sudden sharp pain in his right lower lumbar area.  Since that time his had some pain radiating down into the thigh area and numbness involving the leg and middle toe.  He has had some weakness with things like going downstairs.  No urine or stool incontinence.  Pain is 6-8 out of 10 severity and worse when walking and moving and better and holding completely still.  He has tried some ice and heat with minimal relief.  Having difficulty getting comfortable at night.  He takes low-dose prednisone 2.5 mg daily for polymyalgia rheumatica.  No history of diabetes.  Denies prior history of back difficulties.  No recent reported appetite or weight changes.  No fever.  Past Medical History:  Diagnosis Date  . ALLERGIC RHINITIS 09/26/2007  . BENIGN PROSTATIC HYPERTROPHY 05/27/2007  . COLONIC POLYPS, HX OF 05/27/2007  . DERMATITIS 08/25/2010  . GERD 05/27/2007  . HYPERCHOLESTEROLEMIA 09/26/2007  . Peripheral vascular disease [facility])    Past Surgical History:  Procedure Laterality Date  . LOWER EXTREMITY ANGIOGRAM Left 04/30/2014   Procedure: LOWER EXTREMITY ANGIOGRAM;  Surgeon: Angelia Mould, MD;  Location: [facility] CATH LAB;  Service: Cardiovascular;  Laterality: Left;  . ROTATOR CUFF [place]      reports that he quit smoking about 18 years ago. He has never used smokeless tobacco. He reports current alcohol use of about 6.0 standard drinks of alcohol per week. He reports that he does not use drugs. family history includes Cancer in his father; Diabetes in his brother, brother, brother, brother, brother, and daughter; Heart attack in his mother; Heart disease in his mother; Hypertension in his sister and sister. No Known Allergies  Wt Readings from Last 3  Encounters:  11/13/19 177 lb (80.3 kg)  11/29/18 177 lb (80.3 kg)  11/16/18 179 lb (81.2 kg)     Review of Systems  Constitutional: Negative for activity change, appetite change and fever.  Respiratory: Negative for cough and shortness of breath.   Cardiovascular: Negative for chest pain and leg swelling.  Gastrointestinal: Negative for abdominal pain and vomiting.  Genitourinary: Negative for decreased urine volume, difficulty urinating, dysuria, flank pain and hematuria.  Musculoskeletal: Positive for back pain. Negative for joint swelling.  Neurological: Positive for weakness and numbness.  Hematological: Negative for adenopathy. Does not bruise/bleed easily.       Objective:   Physical Exam Vitals reviewed.  Constitutional:      General: He is not in acute distress.    Appearance: He is well-developed.  Neck:     Thyroid: No thyromegaly.  Cardiovascular:     Rate and Rhythm: Normal rate and regular rhythm.     Heart sounds: Normal heart sounds. No murmur.  Pulmonary:     Effort: Pulmonary effort is normal. No respiratory distress.     Breath sounds: Normal breath sounds. No wheezing or rales.  Skin:    Findings: No rash.  Neurological:     Mental Status: He is alert and oriented to person, place, and time.     Cranial Nerves: No cranial nerve deficit.     Deep Tendon Reflexes: Reflexes are normal and symmetric.     Comments:  He has essentially absent right knee reflex and 2+ on the left.  2+ left ankle reflex and 1+ on the right  He does seem to have intact strength with plantarflexion, dorsiflexion, and knee extension bilaterally  Subjective decreased sensation to touch right leg compared to left        Assessment:     Patient presents with acute onset Saturday of right lumbar back pain.  He has radiculitis/radiculopathy symptoms of numbness and weakness and objective finding of loss of right knee reflex and diminished right ankle reflex along with some sensory  impairment.    Plan:     -Reviewed red flags to watch for such as loss of urine or stool incontinence  -Recommend start prednisone 40 mg daily and taper and after taper will resume his 2.5 mg daily prednisone  -Because of his use of Robaxin 500 mg nightly for muscle spasm.  He is aware this may cause some sedation  -Recommend follow-up with primary in 1 to 2 weeks to reassess especially if not improving with the above.  May need MRI to further assess given neurologic findings above  Eulas Post MD [facility]

## 2019-11-13 NOTE — Patient Instructions (Addendum)
Sciatica Rehab Ask your health care provider which exercises are safe for you. Do exercises exactly as told by your health care provider and adjust them as directed. It is normal to feel mild stretching, pulling, tightness, or discomfort as you do these exercises. Stop right away if you feel sudden pain or your pain gets worse. Do not begin these exercises until told by your health care provider. Stretching and range-of-motion exercises These exercises warm up your muscles and joints and improve the movement and flexibility of your hips and back. These exercises also help to relieve pain, numbness, and tingling. Sciatic nerve glide 1. Sit in a chair with your head facing down toward your chest. Place your hands behind your back. Let your shoulders slump forward. 2. Slowly straighten one of your legs while you tilt your head back as if you are looking toward the ceiling. Only straighten your leg as far as you can without making your symptoms worse. 3. Hold this position for __________ seconds. 4. Slowly return to the starting position. 5. Repeat with your other leg. Repeat __________ times. Complete this exercise __________ times a day. Knee to chest with hip adduction and internal rotation  1. Lie on your back on a firm surface with both legs straight. 2. Bend one of your knees and move it up toward your chest until you feel a gentle stretch in your lower back and buttock. Then, move your knee toward the shoulder that is on the opposite side from your leg. This is hip adduction and internal rotation. ? Hold your leg in this position by holding on to the front of your knee. 3. Hold this position for __________ seconds. 4. Slowly return to the starting position. 5. Repeat with your other leg. Repeat __________ times. Complete this exercise __________ times a day. Prone extension on elbows  1. Lie on your abdomen on a firm surface. A bed may be too soft for this exercise. 2. Prop yourself up on  your elbows. 3. Use your arms to help lift your chest up until you feel a gentle stretch in your abdomen and your lower back. ? This will place some of your body weight on your elbows. If this is uncomfortable, try stacking pillows under your chest. ? Your hips should stay down, against the surface that you are lying on. Keep your hip and back muscles relaxed. 4. Hold this position for __________ seconds. 5. Slowly relax your upper body and return to the starting position. Repeat __________ times. Complete this exercise __________ times a day. Strengthening exercises These exercises build strength and endurance in your back. Endurance is the ability to use your muscles for a long time, even after they get tired. Pelvic tilt This exercise strengthens the muscles that lie deep in the abdomen. 1. Lie on your back on a firm surface. Bend your knees and keep your feet flat on the floor. 2. Tense your abdominal muscles. Tip your pelvis up toward the ceiling and flatten your lower back into the floor. ? To help with this exercise, you may place a small towel under your lower back and try to push your back into the towel. 3. Hold this position for __________ seconds. 4. Let your muscles relax completely before you repeat this exercise. Repeat __________ times. Complete this exercise __________ times a day. Alternating arm and leg raises  1. Get on your hands and knees on a firm surface. If you are on a hard floor, you may want to use   padding, such as an exercise mat, to cushion your knees. 2. Line up your arms and legs. Your hands should be directly below your shoulders, and your knees should be directly below your hips. 3. Lift your left leg behind you. At the same time, raise your right arm and straighten it in front of you. ? Do not lift your leg higher than your hip. ? Do not lift your arm higher than your shoulder. ? Keep your abdominal and back muscles tight. ? Keep your hips facing the  ground. ? Do not arch your back. ? Keep your balance carefully, and do not hold your breath. 4. Hold this position for __________ seconds. 5. Slowly return to the starting position. 6. Repeat with your right leg and your left arm. Repeat __________ times. Complete this exercise __________ times a day. Posture and body mechanics Good posture and healthy body mechanics can help to relieve stress in your body's tissues and joints. Body mechanics refers to the movements and positions of your body while you do your daily activities. Posture is part of body mechanics. Good posture means:  Your spine is in its natural S-curve position (neutral).  Your shoulders are pulled back slightly.  Your head is not tipped forward. Follow these guidelines to improve your posture and body mechanics in your everyday activities. Standing   When standing, keep your spine neutral and your feet about hip width apart. Keep a slight bend in your knees. Your ears, shoulders, and hips should line up.  When you do a task in which you stand in one place for a long time, place one foot up on a stable object that is 2-4 inches (5-10 cm) high, such as a footstool. This helps keep your spine neutral. Sitting   When sitting, keep your spine neutral and keep your feet flat on the floor. Use a footrest, if necessary, and keep your thighs parallel to the floor. Avoid rounding your shoulders, and avoid tilting your head forward.  When working at a desk or a computer, keep your desk at a height where your hands are slightly lower than your elbows. Slide your chair under your desk so you are close enough to maintain good posture.  When working at a computer, place your monitor at a height where you are looking straight ahead and you do not have to tilt your head forward or downward to look at the screen. Resting  When lying down and resting, avoid positions that are most painful for you.  If you have pain with activities  such as sitting, bending, stooping, or squatting, lie in a position in which your body does not bend very much. For example, avoid curling up on your side with your arms and knees near your chest (fetal position).  If you have pain with activities such as standing for a long time or reaching with your arms, lie with your spine in a neutral position and bend your knees slightly. Try the following positions: ? Lying on your side with a pillow between your knees. ? Lying on your back with a pillow under your knees. Lifting   When lifting objects, keep your feet at least shoulder width apart and tighten your abdominal muscles.  Bend your knees and hips and keep your spine neutral. It is important to lift using the strength of your legs, not your back. Do not lock your knees straight out.  Always ask for help to lift heavy or awkward objects. This information is not   intended to replace advice given to you by your health care provider. Make sure you discuss any questions you have with your health care provider. Document Revised: 01/27/2019 Document Reviewed: 10/27/2018 Elsevier Patient Education  2020 [employer]. Lumbosacral Radiculopathy Lumbosacral radiculopathy is a condition that involves the spinal nerves and nerve roots in the low back and bottom of the spine. The condition develops when these nerves and nerve roots move out of place or become inflamed and cause symptoms. What are the causes? This condition may be caused by:  Pressure from a disk that bulges out of place (herniated disk). A disk is a plate of soft cartilage that separates bones in the spine.  Disk changes that occur with age (disk degeneration).  A narrowing of the bones of the lower back (spinal stenosis).  A tumor.  An infection.  An injury that places sudden pressure on the disks that cushion the bones of your lower spine. What increases the risk? You are more likely to develop this condition if:  You are a  male who is 59-3 years old.  You are a male who is 77-70 years old.  You use improper technique when lifting things.  You are overweight or live a sedentary lifestyle.  Your work requires frequent lifting.  You smoke.  You do repetitive activities that strain the spine. What are the signs or symptoms? Symptoms of this condition include:  Pain that goes down from your back into your legs (sciatica), usually on one side of the body. This is the most common symptom. The pain may be worse with sitting, coughing, or sneezing.  Pain and numbness in your legs.  Muscle weakness.  Tingling.  Loss of bladder control or bowel control. How is this diagnosed? This condition may be diagnosed based on:  Your symptoms and medical history.  A physical exam. If the pain is lasting, you may have tests, such as:  MRI scan.  X-ray.  CT scan.  A type of X-ray used to examine the spinal canal after injecting a dye into your spine (myelogram).  A test to measure how electrical impulses move through a nerve (nerve conduction study). How is this treated? Treatment may depend on the cause of the condition and may include:  Working with a physical therapist.  Taking pain medicine.  Applying heat and ice to affected areas.  Doing stretches to improve flexibility.  Doing exercises to strengthen back muscles.  Having chiropractic spinal manipulation.  Using transcutaneous electrical nerve stimulation (TENS) therapy.  Getting a steroid injection in the spine. In some cases, no treatment is needed. If the condition is long-lasting (chronic), or if symptoms are severe, treatment may involve surgery or lifestyle changes, such as following a weight-loss plan. Follow these instructions at home: Activity  Avoid bending and other activities that make the problem worse.  Maintain a proper position when standing or sitting: ? When standing, keep your upper back and neck straight, with  your shoulders pulled back. Avoid slouching. ? When sitting, keep your back straight and relax your shoulders. Do not round your shoulders or pull them backward.  Do not sit or stand in one place for long periods of time.  Take brief periods of rest throughout the day. This will reduce your pain. It is usually better to rest by lying down or standing, not sitting.  When you are resting for longer periods, mix in some mild activity or stretching between periods of rest. This will help to prevent stiffness  and pain.  Get regular exercise. Ask your health care provider what activities are safe for you. If you were shown how to do any exercises or stretches, do them as directed by your health care provider.  Do not lift anything that is heavier than 10 lb (4.5 kg) or the limit that you are told by your health care provider. Always use proper lifting technique, which includes: ? Bending your knees. ? Keeping the load close to your body. ? Avoiding twisting. Managing pain  If directed, put ice on the affected area: ? Put ice in a plastic bag. ? Place a towel between your skin and the bag. ? Leave the ice on for 20 minutes, 2-3 times a day.  If directed, apply heat to the affected area as often as told by your health care provider. Use the heat source that your health care provider recommends, such as a moist heat pack or a heating pad. ? Place a towel between your skin and the heat source. ? Leave the heat on for 20-30 minutes. ? Remove the heat if your skin turns bright red. This is especially important if you are unable to feel pain, heat, or cold. You may have a greater risk of getting burned.  Take over-the-counter and prescription medicines only as told by your health care provider. General instructions  Sleep on a firm mattress in a comfortable position. Try lying on your side with your knees slightly bent. If you lie on your back, put a pillow under your knees.  Do not drive or use  heavy machinery while taking prescription pain medicine.  If your health care provider prescribed a diet or exercise program, follow it as directed.  Keep all follow-up visits as told by your health care provider. This is important. Contact a health care provider if:  Your pain does not improve over time, even when taking pain medicines. Get help right away if:  You develop severe pain.  Your pain suddenly gets worse.  You develop increasing weakness in your legs.  You lose the ability to control your bladder or bowel.  You have difficulty walking or balancing.  You have a fever. Summary  Lumbosacral radiculopathy is a condition that occurs when the spinal nerves and nerve roots in the lower part of the spine move out of place or become inflamed and cause symptoms.  Symptoms include pain, numbness, and tingling that go down from your back into your legs (sciatica), muscle weakness, and loss of bladder control or bowel control.  If directed, apply ice or heat to the affected area as told by your health care provider.  Follow instructions about activity, rest, and proper lifting technique. This information is not intended to replace advice given to you by your health care provider. Make sure you discuss any questions you have with your health care provider. Document Revised: 09/23/2017 Document Reviewed: 09/23/2017 Elsevier Patient Education  [employer].

## 2019-11-16 ENCOUNTER — Other Ambulatory Visit: Payer: Self-pay

## 2019-11-16 ENCOUNTER — Telehealth: Payer: Self-pay | Admitting: Adult Health

## 2019-11-16 ENCOUNTER — Encounter: Payer: Self-pay | Admitting: Adult Health

## 2019-11-16 ENCOUNTER — Telehealth (INDEPENDENT_AMBULATORY_CARE_PROVIDER_SITE_OTHER): Payer: Medicare HMO | Admitting: Adult Health

## 2019-11-16 DIAGNOSIS — M5416 Radiculopathy, lumbar region: Secondary | ICD-10-CM | POA: Diagnosis not present

## 2019-11-16 MED ORDER — TIZANIDINE HCL 2 MG PO CAPS: 2.0000 mg | ORAL_CAPSULE | Freq: Every day | ORAL | 0 refills | Status: DC

## 2019-11-16 MED ORDER — TRAMADOL HCL 50 MG PO TABS: 50.0000 mg | ORAL_TABLET | Freq: Two times a day (BID) | ORAL | 0 refills | Status: DC | PRN

## 2019-11-16 NOTE — Telephone Encounter (Signed)
Pt received a call from Gwenyth Ober about his appt today 11/16/19. Per Tillie Rung she will be giving him a call back when she is available. Nothing further

## 2019-11-16 NOTE — Progress Notes (Signed)
Virtual Visit via Telephone Note  I connected with Brandon Dixon on 11/16/19 at  1:30 PM EST by telephone and verified that I am speaking with the correct person using two identifiers.   I discussed the limitations, risks, security and privacy concerns of performing an evaluation and management service by telephone and the availability of in person appointments. I also discussed with the patient that there may be a patient responsible charge related to this service. The patient expressed understanding and agreed to proceed.  Location patient: home Location provider: work or home office Participants present for the call: patient, provider Patient did not have a visit in the prior 7 days to address this/these issue(s).   History of Present Illness: 74 year old male who was seen by another provider 3 days ago with low back pain with radiating pain down the right lower extremity.  Aggravating injury was starting a chain saw when he felt a sudden sharp pain in his right lower lumbar area.  Since that time he had some pain rating down the right thigh and numbness involving the leg and middle toe.  He did endorse some weakness with things like going down stairs.  He denied urine or stool incontinence.  Pain is a 6-8 out of 10 in severity and worse while walking or moving.  Was using ice and heat with minimal relief and having difficulty getting comfortable at night.  He was started on a 40 mg prednisone taper x 8 days prescribed a course of Robaxin.  He started the prednisone day that he was seeing.  Unfortunately has had no improvement in the intensity or quality of his low back pain and continues to have radiating pain down the right leg.  Feels as though the Robaxin is not really doing anything.  He is also tried gentle stretching exercises but feels as though this aggravates his discomfort   Observations/Objective: Patient sounds cheerful and well on the phone. I do not appreciate any SOB. Speech  and thought processing are grossly intact. Patient reported vitals:  Assessment and Plan: 1. Right lumbar radiculopathy -Have him DC Robaxin.  I would like him to finish his prednisone.  Will send in tramadol that he can take in the morning and late afternoon and then Zanaflex that he can take in the evening before bed.  He was advised that both of these medications can make him sleepy and not take them together.  We will have him follow-up early next week and if no improvement will order MRI - traMADol (ULTRAM) 50 MG tablet; Take 1 tablet (50 mg total) by mouth 2 (two) times daily as needed for up to 5 days.  Dispense: 10 tablet; Refill: 0 - tizanidine (ZANAFLEX) 2 MG capsule; Take 1 capsule (2 mg total) by mouth at bedtime.  Dispense: 5 capsule; Refill: 0   Follow Up Instructions:  I did not refer this patient for an OV in the next 24 hours for this/these issue(s).  I discussed the assessment and treatment plan with the patient. The patient was provided an opportunity to ask questions and all were answered. The patient agreed with the plan and demonstrated an understanding of the instructions.   The patient was advised to call back or seek an in-person evaluation if the symptoms worsen or if the condition fails to improve as anticipated.  I provided 15 minutes of non-face-to-face time during this encounter.   Dorothyann Peng, NP

## 2019-11-21 ENCOUNTER — Other Ambulatory Visit: Payer: Self-pay

## 2019-11-21 ENCOUNTER — Telehealth (INDEPENDENT_AMBULATORY_CARE_PROVIDER_SITE_OTHER): Payer: Medicare HMO | Admitting: Adult Health

## 2019-11-21 ENCOUNTER — Encounter: Payer: Self-pay | Admitting: Adult Health

## 2019-11-21 DIAGNOSIS — M5416 Radiculopathy, lumbar region: Secondary | ICD-10-CM

## 2019-11-21 MED ORDER — TRAMADOL HCL 50 MG PO TABS: 50.0000 mg | ORAL_TABLET | Freq: Three times a day (TID) | ORAL | 0 refills | Status: DC

## 2019-11-21 NOTE — Progress Notes (Signed)
Virtual Visit via Telephone Note  I connected with Brandon Dixon on 11/21/19 at  4:00 PM EST by telephone and verified that I am speaking with the correct person using two identifiers.   I discussed the limitations, risks, security and privacy concerns of performing an evaluation and management service by telephone and the availability of in person appointments. I also discussed with the patient that there may be a patient responsible charge related to this service. The patient expressed understanding and agreed to proceed.  Location patient: home Location provider: work or home office Participants present for the call: patient, provider Patient did not have a visit in the prior 7 days to address this/these issue(s).   History of Present Illness: Follow-up appointment for low back pain with radiating pain down the right lower extremity.  He was originally seen on 11/13/2019 by another provider and scribed 40 mg prednisone taper x8 days and a course of Robaxin.  Unfortunately he had no improvement in the intensity or quality of his low back pain on follow-up visit on 11/16/2019.  At this visit he was switched from Robaxin to Zanaflex and given a course of tramadol for pain relief.  Today he reports that he continues to have low back pain with radiating pain and numbness down his right leg.  Tramadol helps ease the pain but it never goes away completely.  Pain is more pronounced when standing and he has trouble getting comfortable at night.  Denies any issues with bowel or bladder   Observations/Objective: Patient sounds cheerful and well on the phone. I do not appreciate any SOB. Speech and thought processing are grossly intact. Patient reported vitals:  Assessment and Plan: 1. Right lumbar radiculopathy -We will order MRI of lumbar spine.  Send in another course of tramadol to help ease his discomfort.  May need to consider referral to neurosurgery in the future - MR Lumbar Spine Wo Contrast;  Future - traMADol (ULTRAM) 50 MG tablet; Take 1 tablet (50 mg total) by mouth 3 (three) times daily for 15 days.  Dispense: 45 tablet; Refill: 0   Follow Up Instructions:   I did not refer this patient for an OV in the next 24 hours for this/these issue(s).  I discussed the assessment and treatment plan with the patient. The patient was provided an opportunity to ask questions and all were answered. The patient agreed with the plan and demonstrated an understanding of the instructions.   The patient was advised to call back or seek an in-person evaluation if the symptoms worsen or if the condition fails to improve as anticipated.  I provided 15 minutes of non-face-to-face time during this encounter.   Dorothyann Peng, NP

## 2019-11-23 ENCOUNTER — Telehealth: Payer: Self-pay | Admitting: Adult Health

## 2019-11-23 NOTE — Telephone Encounter (Signed)
Pt called to check on his MRI he informs that it has been two weeks and he is not feeling any better and he needs MRI done as soon as possible. He would like a phone call back with information on what to expect. [phone])

## 2019-11-23 NOTE — Telephone Encounter (Signed)
Sent pt a Mychart message informing of update.

## 2019-11-24 NOTE — Telephone Encounter (Signed)
Is there something I can do?

## 2019-11-28 ENCOUNTER — Other Ambulatory Visit: Payer: Self-pay

## 2019-11-28 ENCOUNTER — Ambulatory Visit
Admission: RE | Admit: 2019-11-28 | Discharge: 2019-11-28 | Disposition: A | Discharge status: Home / Self Care | Payer: Medicare HMO | Source: Ambulatory Visit | Attending: Adult Health | Admitting: Adult Health

## 2019-11-28 DIAGNOSIS — M48061 Spinal stenosis, lumbar region without neurogenic claudication: Secondary | ICD-10-CM | POA: Diagnosis not present

## 2019-11-28 DIAGNOSIS — M5416 Radiculopathy, lumbar region: Secondary | ICD-10-CM

## 2019-11-28 IMAGING — MR MR LUMBAR SPINE W/O CM
4 of 5 series · 21 of 48 positions shown · non-contrast
Comparison: None.

CLINICAL DATA: Low back and right buttock and leg pain for 10 days.

EXAM:
MRI LUMBAR SPINE WITHOUT CONTRAST
TECHNIQUE: Multiplanar, multisequence MR imaging of the lumbar spine was
performed. No intravenous contrast was administered.

[Series 3: T2 · sagittal · 4.0mm · 0.44mm/px · 7 of 15 slices shown (1 of 2)]
[im 1/15]
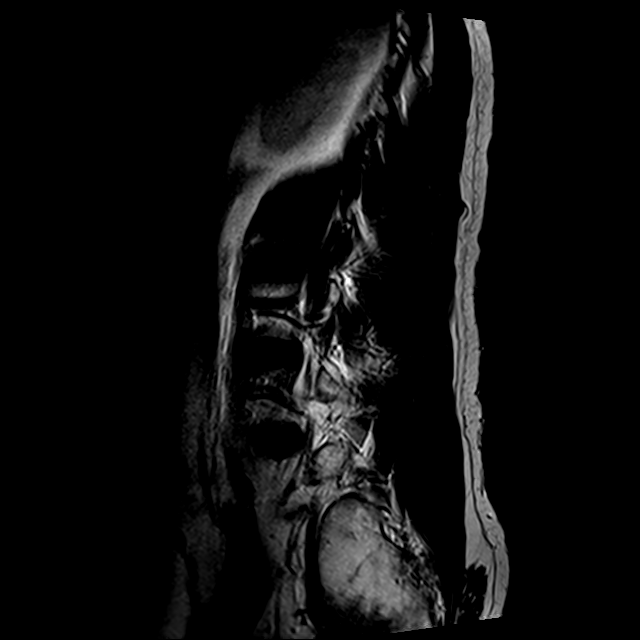
[im 3/15]
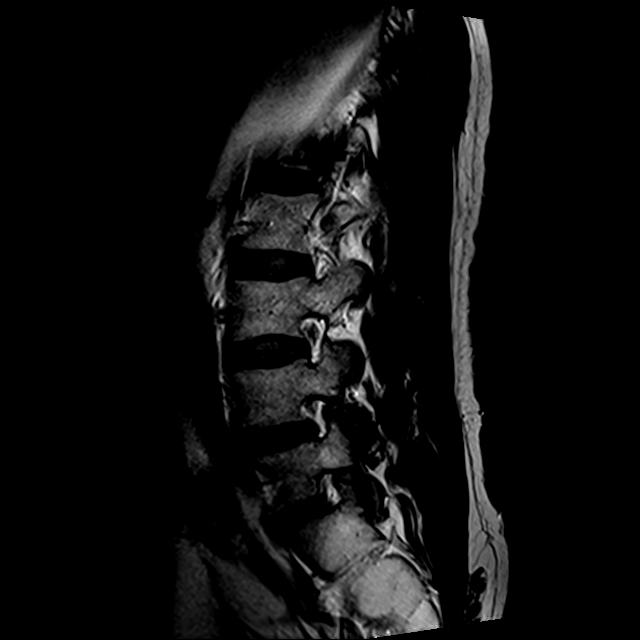
[im 5/15]
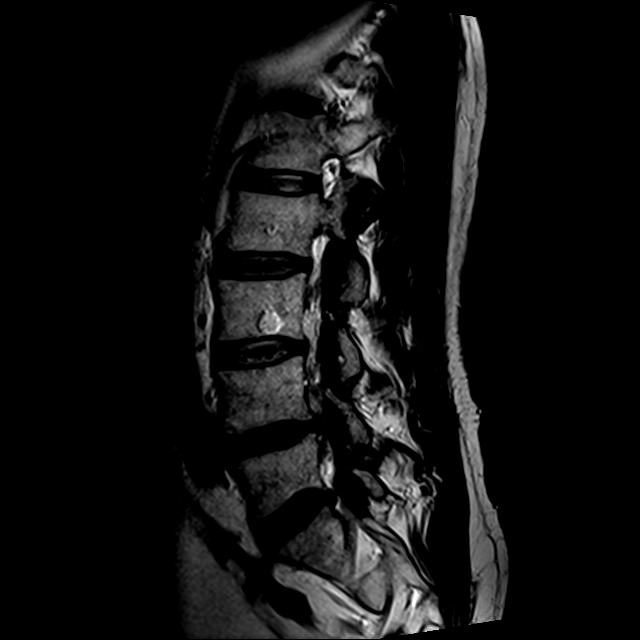
[im 8/15]
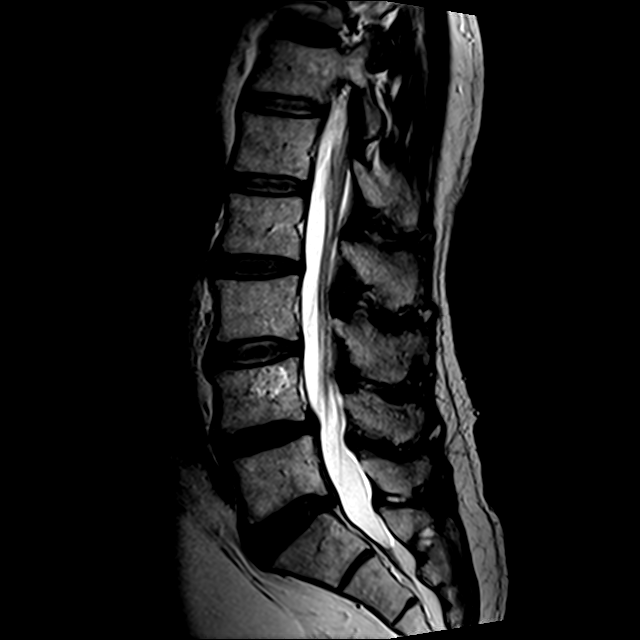
[im 10/15]
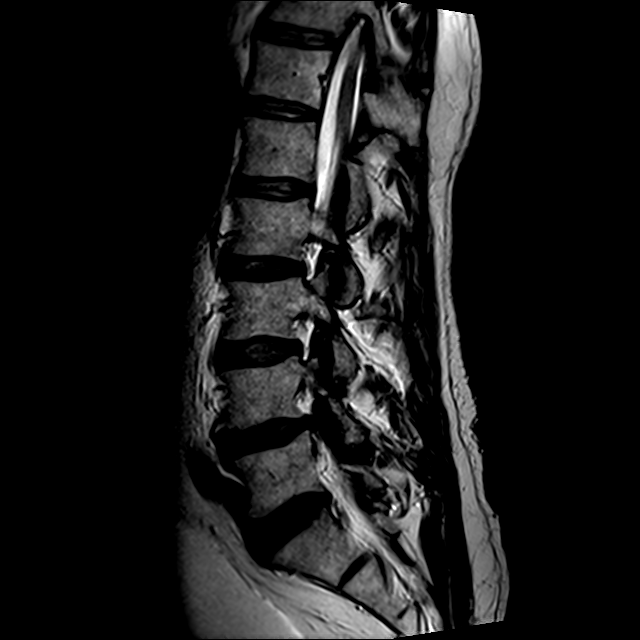
[im 12/15]
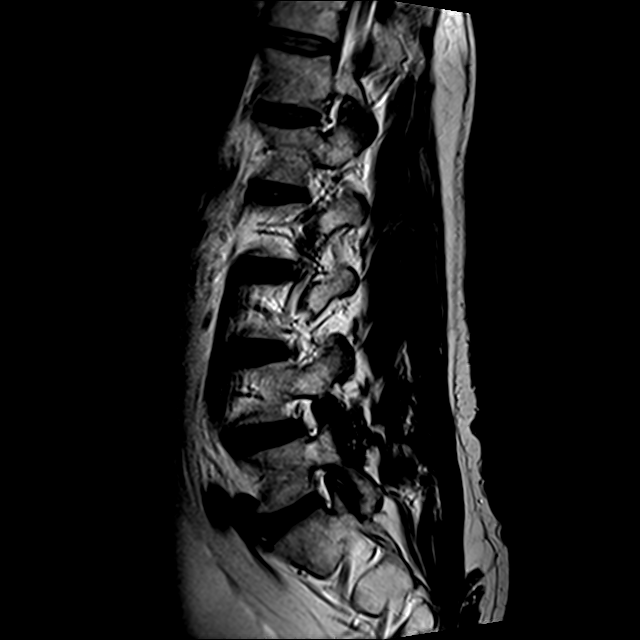
[im 15/15]
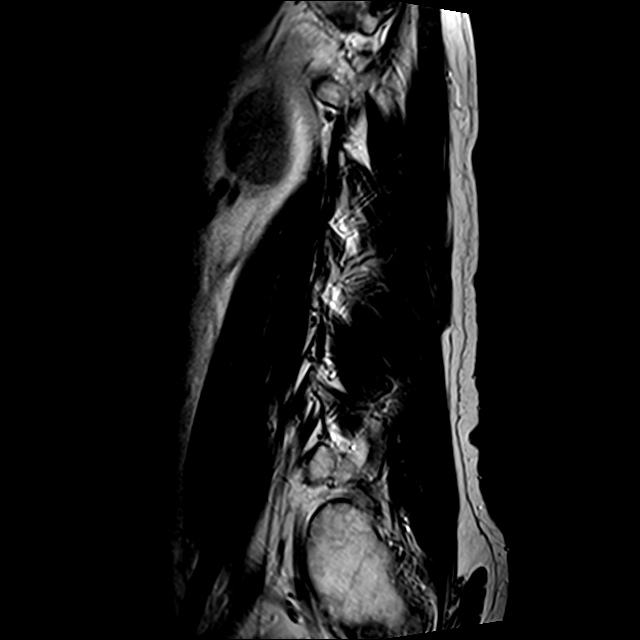

[Series 4: T1 · sagittal · 4.0mm · 0.55mm/px · 3 of 15 slices shown (1 of 2)]
[im 3/15]
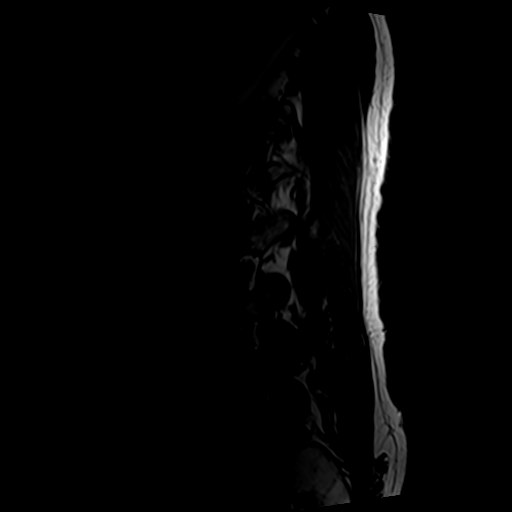
[im 8/15]
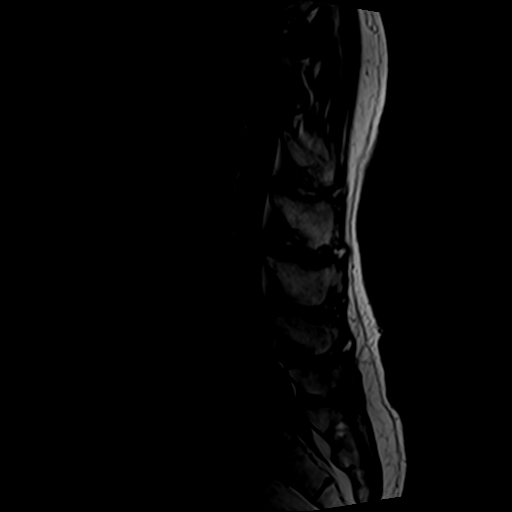
[im 12/15]
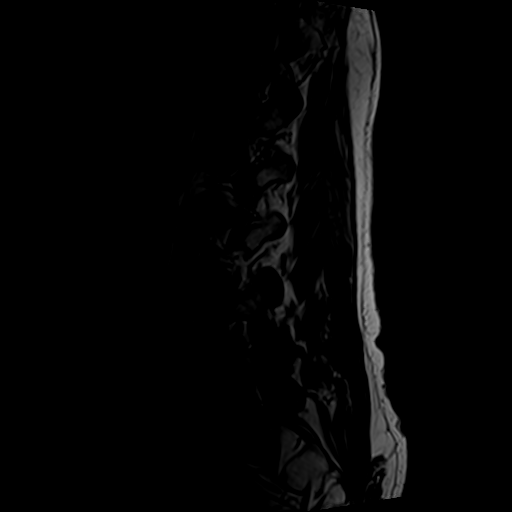

[Series 5: T1 · axial · 4.0mm · 0.37mm/px · z∈[-110,+47]mm · 3 of 33 slices shown (2 of 2)]
[im 5/33]
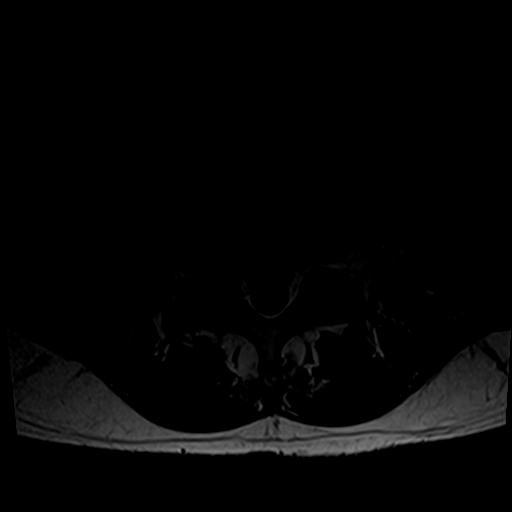
[im 18/33]
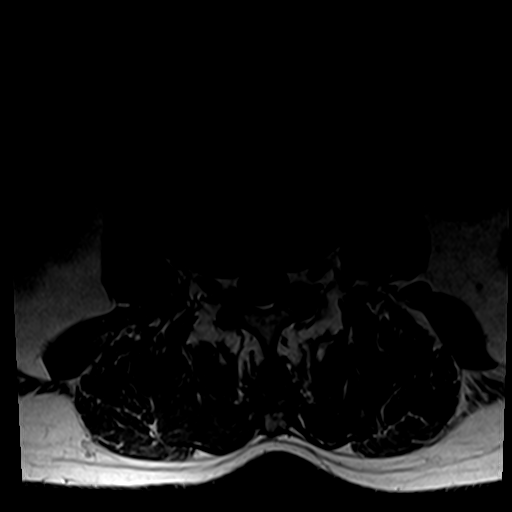
[im 28/33]
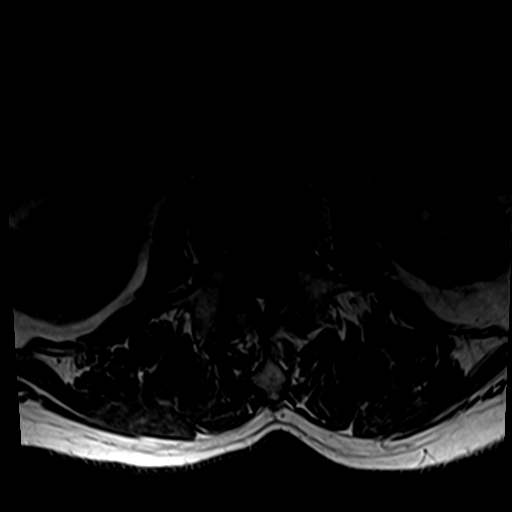

[Series 7: T2 · axial · 4.0mm · 0.74mm/px · z∈[-130,+106]mm · 8 of 33 slices shown (2 of 2)]
[im 1/33]
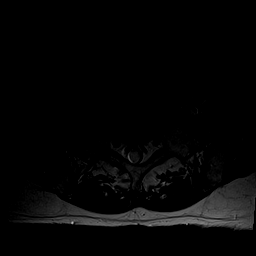
[im 5/33]
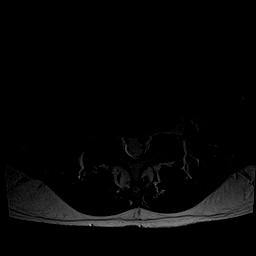
[im 10/33]
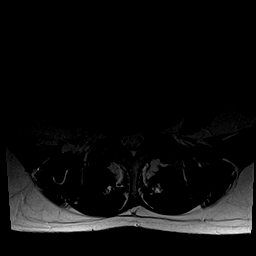
[im 15/33]
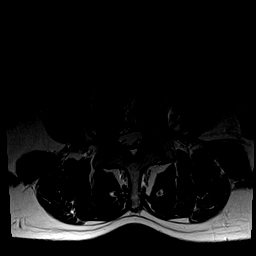
[im 18/33]
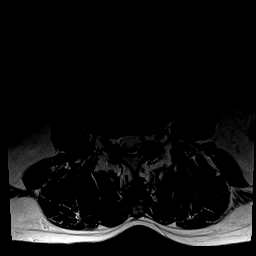
[im 23/33]
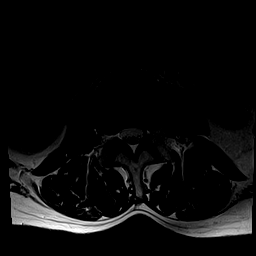
[im 28/33]
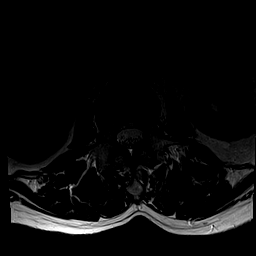
[im 33/33]
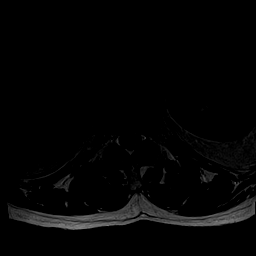

[21 of 48 positions shown; findings below may reference images not displayed]

FINDINGS: Segmentation: There are five lumbar type vertebral bodies. The last
full intervertebral disc space is labeled L5-S1.

Alignment: Right convex lumbar scoliosis but normal alignment in the
sagittal plane.

Vertebrae: Multi septated cystic appearing lesions in L3 and L4
possibly intraosseous ganglia or atypical hemangiomas. No worrisome
bone lesions or acute fracture.

Conus medullaris and cauda equina: Conus extends to the L1-2 level.
Conus and cauda equina appear normal.

Paraspinal and other soft tissues: No significant paraspinal or
retroperitoneal findings.

Disc levels:

L1-2: No significant findings. Mild facet disease.

L2-3: Mild facet disease but no disc protrusions, spinal or
foraminal stenosis.

L3-4: Mild facet disease but no disc protrusions, spinal or
foraminal stenosis.

L4-5: There is a moderate-sized right paracentral disc extrusion
with disc material coursing up behind the L4 vertebral body. There
is moderate mass effect on the right side of the thecal sac and on
the right L5 nerve root in the lateral recess. The exiting L4 nerve
roots appear normal. Moderate facet disease.

L5-S1: Mild to moderate facet disease but no disc protrusions,
spinal or foraminal stenosis.
IMPRESSION: Moderate-sized right paracentral disc extrusion at L4-5 with mass
effect on the thecal sac and on the right L5 nerve root in the
lateral recess.

## 2019-11-29 ENCOUNTER — Telehealth: Payer: Self-pay | Admitting: Adult Health

## 2019-11-29 DIAGNOSIS — M5126 Other intervertebral disc displacement, lumbar region: Secondary | ICD-10-CM

## 2019-11-29 NOTE — Telephone Encounter (Signed)
Updated patient on MRI of lumbar spine   IMPRESSION: Moderate-sized right paracentral disc extrusion at L4-5 with mass effect on the thecal sac and on the right L5 nerve root in the lateral recess.  Will send to [facility]

## 2019-11-30 ENCOUNTER — Other Ambulatory Visit: Payer: Self-pay

## 2019-11-30 DIAGNOSIS — R030 Elevated blood-pressure reading, without diagnosis of hypertension: Secondary | ICD-10-CM | POA: Diagnosis not present

## 2019-11-30 DIAGNOSIS — M5416 Radiculopathy, lumbar region: Secondary | ICD-10-CM | POA: Diagnosis not present

## 2019-12-01 ENCOUNTER — Encounter: Payer: Self-pay | Admitting: Adult Health

## 2019-12-01 ENCOUNTER — Ambulatory Visit (INDEPENDENT_AMBULATORY_CARE_PROVIDER_SITE_OTHER): Payer: Medicare HMO | Admitting: Adult Health

## 2019-12-01 VITALS — BP 120/80 | HR 85 | Temp 97.8°F | Ht 71.75 in | Wt 174.0 lb

## 2019-12-01 DIAGNOSIS — Z1211 Encounter for screening for malignant neoplasm of colon: Secondary | ICD-10-CM | POA: Diagnosis not present

## 2019-12-01 DIAGNOSIS — Z0000 Encounter for general adult medical examination without abnormal findings: Secondary | ICD-10-CM | POA: Diagnosis not present

## 2019-12-01 DIAGNOSIS — E7800 Pure hypercholesterolemia, unspecified: Secondary | ICD-10-CM

## 2019-12-01 DIAGNOSIS — I739 Peripheral vascular disease, unspecified: Secondary | ICD-10-CM

## 2019-12-01 DIAGNOSIS — M353 Polymyalgia rheumatica: Secondary | ICD-10-CM

## 2019-12-01 DIAGNOSIS — N400 Enlarged prostate without lower urinary tract symptoms: Secondary | ICD-10-CM | POA: Diagnosis not present

## 2019-12-01 LAB — COMPREHENSIVE METABOLIC PANEL
ALT: 22 U/L (ref 0–53)
AST: 26 U/L (ref 0–37)
Albumin: 4.3 g/dL (ref 3.5–5.2)
Alkaline Phosphatase: 123 U/L — ABNORMAL HIGH (ref 39–117)
BUN: 17 mg/dL (ref 6–23)
CO2: 32 mEq/L (ref 19–32)
Calcium: 9.9 mg/dL (ref 8.4–10.5)
Chloride: 100 mEq/L (ref 96–112)
Creatinine, Ser: 0.93 mg/dL (ref 0.40–1.50)
GFR: 96.2 mL/min (ref >60.00)
Glucose, Bld: 99 mg/dL (ref 70–99)
Potassium: 4.4 mEq/L (ref 3.5–5.1)
Sodium: 139 mEq/L (ref 135–145)
Total Bilirubin: 0.7 mg/dL (ref 0.2–1.2)
Total Protein: 7.7 g/dL (ref 6.0–8.3)

## 2019-12-01 LAB — CBC WITH DIFFERENTIAL/PLATELET
Basophils Absolute: 0.1 10*3/uL (ref 0.0–0.1)
Basophils Relative: 0.7 % (ref 0.0–3.0)
Eosinophils Absolute: 0.3 10*3/uL (ref 0.0–0.7)
Eosinophils Relative: 3.4 % (ref 0.0–5.0)
HCT: 47.6 % (ref 39.0–52.0)
Hemoglobin: 15.7 g/dL (ref 13.0–17.0)
Lymphocytes Relative: 21.9 % (ref 12.0–46.0)
Lymphs Abs: 2.1 10*3/uL (ref 0.7–4.0)
MCHC: 33 g/dL (ref 30.0–36.0)
MCV: 89 fl (ref 78.0–100.0)
Monocytes Absolute: 0.8 10*3/uL (ref 0.1–1.0)
Monocytes Relative: 8.8 % (ref 3.0–12.0)
Neutro Abs: 6.2 10*3/uL (ref 1.4–7.7)
Neutrophils Relative %: 65.2 % (ref 43.0–77.0)
Platelets: 209 10*3/uL (ref 150.0–400.0)
RBC: 5.35 Mil/uL (ref 4.22–5.81)
RDW: 14 % (ref 11.5–15.5)
WBC: 9.5 10*3/uL (ref 4.0–10.5)

## 2019-12-01 LAB — LIPID PANEL
Cholesterol: 186 mg/dL (ref 0–200)
HDL: 63.1 mg/dL (ref >39.00)
LDL Cholesterol: 103 mg/dL — ABNORMAL HIGH (ref 0–99)
NonHDL: 122.6
Total CHOL/HDL Ratio: 3
Triglycerides: 96 mg/dL (ref 0.0–149.0)
VLDL: 19.2 mg/dL (ref 0.0–40.0)

## 2019-12-01 LAB — TSH: TSH: 1.01 u[IU]/mL (ref 0.35–4.50)

## 2019-12-01 LAB — C-REACTIVE PROTEIN: CRP: <1 mg/dL (ref 0.5–20.0)

## 2019-12-01 LAB — PSA: PSA: 0.8 ng/mL (ref 0.10–4.00)

## 2019-12-01 LAB — SEDIMENTATION RATE: Sed Rate: 38 mm/hr — ABNORMAL HIGH (ref 0–20)

## 2019-12-01 MED ORDER — ATORVASTATIN CALCIUM 80 MG PO TABS: 80.0000 mg | ORAL_TABLET | Freq: Every day | ORAL | 3 refills | Status: DC

## 2019-12-01 MED ORDER — TAMSULOSIN HCL 0.4 MG PO CAPS: ORAL_CAPSULE | ORAL | 3 refills | Status: DC

## 2019-12-01 MED ORDER — PREDNISONE 2.5 MG PO TABS: ORAL_TABLET | ORAL | 3 refills | Status: DC

## 2019-12-01 NOTE — Progress Notes (Signed)
Subjective:    Patient ID: Brandon Dixon, male    DOB: January 10, 1946, 74 y.o.   MRN: [id number]  HPI Patient presents for yearly preventative medicine examination. He is a pleasant 74 year old male who  has a past medical history of ALLERGIC RHINITIS (09/26/2007), BENIGN PROSTATIC HYPERTROPHY (05/27/2007), COLONIC POLYPS, HX OF (05/27/2007), DERMATITIS (08/25/2010), GERD (05/27/2007), HYPERCHOLESTEROLEMIA (09/26/2007), and Peripheral vascular disease ([place]).  Hyperlipidemia -currently prescribed Lipitor 80 mg and aspirin 81 mg daily.  He denies myalgia or fatigue Lab Results  Component Value Date   CHOL 158 11/29/2018   HDL 64.20 11/29/2018   LDLCALC 80 11/29/2018   LDLDIRECT 193.1 09/22/2006   TRIG 69.0 11/29/2018   CHOLHDL 2 11/29/2018   BPH -uses Flomax 0.4 mg daily.  Denies any symptoms currently  PVD -followed by vascular surgery on a yearly basis due to peripheral vascular disease in the left leg.  He denies any complaints today  Polymyalgia Rheumatica -is currently prescribed prednisone 5 mg daily.    Low back pain -has been present since 11/13/2019.  Back pain started when he was starting his chainsaw and felt a sudden sharp pain in the right lower lumbar area.Initially was placed on a prednisone taper and Robaxin.  On follow-up he continued to have pain and felt as though the Robaxin was not working for him.  He was then prescribed tramadol and Zanaflex and reports that the tramadol helps ease the pain but does not get rid of it completely.  Most recently had an MRI of the lumbar spine 3 days ago which showed:  IMPRESSION: Moderate-sized right paracentral disc extrusion at L4-5 with mass effect on the thecal sac and on the right L5 nerve root in the lateral recess.  He was subsequently referred to neurosurgery for further evaluation. He reports that he was seen yesterday at [facility]. It sounds like they are going to refer him to [place] for epidural  injection? I have not received the report yet.   All immunizations and health maintenance protocols were reviewed with the patient and needed orders were placed.  Appropriate screening laboratory values were ordered for the patient including screening of hyperlipidemia, renal function and hepatic function. If indicated by BPH, a PSA was ordered.   Medication reconciliation,  past medical history, social history, problem list and allergies were reviewed in detail with the patient  Goals were established with regard to weight loss, exercise, and  diet in compliance with medications  End of life planning was discussed.  He is due for colonoscopy   Review of Systems  Constitutional: Negative.   HENT: Negative.   Eyes: Negative.   Respiratory: Negative.   Cardiovascular: Negative.   Gastrointestinal: Negative.   Endocrine: Negative.   Genitourinary: Negative.   Musculoskeletal: Positive for back pain.  Skin: Negative.   Allergic/Immunologic: Negative.   Neurological: Positive for numbness (right leg).  Hematological: Negative.   Psychiatric/Behavioral: Negative.   All other systems reviewed and are negative.  Past Medical History:  Diagnosis Date  . ALLERGIC RHINITIS 09/26/2007  . BENIGN PROSTATIC HYPERTROPHY 05/27/2007  . COLONIC POLYPS, HX OF 05/27/2007  . DERMATITIS 08/25/2010  . GERD 05/27/2007  . HYPERCHOLESTEROLEMIA 09/26/2007  . Peripheral vascular disease ([place])     Social History   Socioeconomic History  . Marital status: Married    Spouse name: Not on file  . Number of children: Not on file  . Years of education: Not on file  . Highest education  level: Not on file  Occupational History  . Not on file  Tobacco Use  . Smoking status: Former Smoker    Quit date: 10/19/2001    Years since quitting: 18.1  . Smokeless tobacco: Never Used  Substance and Sexual Activity  . Alcohol use: Yes    Alcohol/week: 6.0 standard drinks    Types: 6 Cans of beer per week  . Drug  use: No  . Sexual activity: Not on file  Other Topics Concern  . Not on file  Social History Narrative   Works as a Physicist, medical    Married    Three daughters    Social Determinants of Radio broadcast assistant Strain:   . Difficulty of Paying Living Expenses: Not on file  Food Insecurity:   . Worried About Charity fundraiser in the Last Year: Not on file  . Ran Out of Food in the Last Year: Not on file  Transportation Needs:   . Lack of Transportation (Medical): Not on file  . Lack of Transportation (Non-Medical): Not on file  Physical Activity:   . Days of Exercise per Week: Not on file  . Minutes of Exercise per Session: Not on file  Stress:   . Feeling of Stress : Not on file  Social Connections:   . Frequency of Communication with Friends and Family: Not on file  . Frequency of Social Gatherings with Friends and Family: Not on file  . Attends Religious Services: Not on file  . Active Member of Clubs or Organizations: Not on file  . Attends Archivist Meetings: Not on file  . Marital Status: Not on file  Intimate Partner Violence:   . Fear of Current or Ex-Partner: Not on file  . Emotionally Abused: Not on file  . Physically Abused: Not on file  . Sexually Abused: Not on file    Past Surgical History:  Procedure Laterality Date  . LOWER EXTREMITY ANGIOGRAM Left 04/30/2014   Procedure: LOWER EXTREMITY ANGIOGRAM;  Surgeon: Angelia Mould, MD;  Location: [facility] CATH LAB;  Service: Cardiovascular;  Laterality: Left;  . ROTATOR CUFF REPAIR    . VASECTOMY      Family History  Problem Relation Age of Onset  . Heart disease Mother   . Heart attack Mother        64   . Cancer Father        lung ca  . Hypertension Sister   . Diabetes Brother   . Hypertension Sister   . Diabetes Brother   . Diabetes Brother   . Diabetes Brother   . Diabetes Brother   . Diabetes Daughter     No Known Allergies  Current Outpatient Medications on File Prior to  Visit  Medication Sig Dispense Refill  . aspirin EC 81 MG tablet Take 81 mg by mouth daily.    Marland Kitchen atorvastatin (LIPITOR) 80 MG tablet Take 1 tablet (80 mg total) by mouth daily. 90 tablet 3  . Calcium 600-200 MG-UNIT per tablet Take 1 tablet by mouth daily.    Marland Kitchen EPINEPHrine 0.3 mg/0.3 mL IJ SOAJ injection Inject 0.3 mLs (0.3 mg total) into the muscle once. 1 Device 1  . fluticasone (FLONASE) 50 MCG/ACT nasal spray PLACE 2 SPRAYS INTO BOTH NOSTRILS DAILY. 48 g 4  . naproxen (NAPROSYN) 500 MG tablet Take 1 tablet (500 mg total) by mouth 2 (two) times daily with a meal. 20 tablet 2  . predniSONE (DELTASONE) 2.5 MG  tablet Take 2 tablets every morning 135 tablet 3  . sildenafil (REVATIO) 20 MG tablet 2 tablets daily as needed 90 tablet 2  . tamsulosin (FLOMAX) 0.4 MG CAPS capsule TAKE 1 CAPSULE  DAILY AFTER SUPPER. 90 capsule 3  . traMADol (ULTRAM) 50 MG tablet Take 1 tablet (50 mg total) by mouth 3 (three) times daily for 15 days. 45 tablet 0  . triamcinolone cream (KENALOG) 0.1 % Apply 1 application topically 2 (two) times daily. 180 g 1   No current facility-administered medications on file prior to visit.    There were no vitals taken for this visit.      Objective:   Physical Exam Vitals and nursing note reviewed.  Constitutional:      General: He is not in acute distress.    Appearance: He is well-developed. He is not diaphoretic.  HENT:     Head: Normocephalic and atraumatic.     Right Ear: Tympanic membrane, ear canal and external ear normal. There is no impacted cerumen.     Left Ear: Tympanic membrane, ear canal and external ear normal. There is no impacted cerumen.     Nose: Nose normal. No congestion or rhinorrhea.     Mouth/Throat:     Mouth: Mucous membranes are moist.     Pharynx: Oropharynx is clear. No oropharyngeal exudate or posterior oropharyngeal erythema.  Eyes:     General:        Right eye: No discharge.        Left eye: No discharge.     Conjunctiva/sclera:  Conjunctivae normal.     Pupils: Pupils are equal, round, and reactive to light.  Neck:     Thyroid: No thyromegaly.     Trachea: No tracheal deviation.  Cardiovascular:     Rate and Rhythm: Normal rate and regular rhythm.     Heart sounds: Normal heart sounds. No murmur. No friction rub. No gallop.   Pulmonary:     Effort: Pulmonary effort is normal. No respiratory distress.     Breath sounds: Normal breath sounds. No stridor. No wheezing, rhonchi or rales.  Chest:     Chest wall: No tenderness.  Abdominal:     General: Bowel sounds are normal. There is no distension.     Palpations: Abdomen is soft. There is no mass.     Tenderness: There is no abdominal tenderness. There is no right CVA tenderness, left CVA tenderness, guarding or rebound.     Hernia: No hernia is present.  Musculoskeletal:        General: No swelling, tenderness, deformity or signs of injury. Normal range of motion.     Right lower leg: No edema.     Left lower leg: No edema.  Lymphadenopathy:     Cervical: No cervical adenopathy.  Skin:    General: Skin is warm and dry.     Capillary Refill: Capillary refill takes less than 2 seconds.     Coloration: Skin is not jaundiced or pale.     Findings: No bruising, erythema, lesion or rash.  Neurological:     General: No focal deficit present.     Mental Status: He is alert and oriented to person, place, and time.     Cranial Nerves: No cranial nerve deficit.     Coordination: Coordination normal.  Psychiatric:        Mood and Affect: Mood normal.        Behavior: Behavior normal.  Thought Content: Thought content normal.        Judgment: Judgment normal.       Assessment & Plan:  1. Routine general medical examination at a health care facility - Follow up in one year or sooner if needed - Continue to stay active and eat a heart healthy diet  - CBC with Differential/Platelet - Comprehensive metabolic panel - Lipid panel - TSH  2. Benign prostatic  hyperplasia without lower urinary tract symptoms  - PSA - tamsulosin (FLOMAX) 0.4 MG CAPS capsule; TAKE 1 CAPSULE  DAILY AFTER SUPPER.  Dispense: 90 capsule; Refill: 3  3. HYPERCHOLESTEROLEMIA  - CBC with Differential/Platelet - Comprehensive metabolic panel - Lipid panel - TSH - atorvastatin (LIPITOR) 80 MG tablet; Take 1 tablet (80 mg total) by mouth daily.  Dispense: 90 tablet; Refill: 3  4. Polymyalgia rheumatica (HCC)  - CBC with Differential/Platelet - Comprehensive metabolic panel - Lipid panel - TSH - Sedimentation Rate - C-reactive Protein - predniSONE (DELTASONE) 2.5 MG tablet; Take 2 tablets every morning  Dispense: 135 tablet; Refill: 3  5. Left leg claudication (HCC) - Follow up with vein and vascular as directed  6. Colon cancer screening  - Ambulatory referral to Gastroenterology   Dorothyann Peng, NP

## 2019-12-11 ENCOUNTER — Telehealth: Payer: Self-pay | Admitting: Adult Health

## 2019-12-11 DIAGNOSIS — M5416 Radiculopathy, lumbar region: Secondary | ICD-10-CM

## 2019-12-11 NOTE — Telephone Encounter (Signed)
traMADol HCl 50 mg    Sent to:  [place], [place] Phone:  [phone]  Fax:  [phone]

## 2019-12-11 NOTE — Telephone Encounter (Signed)
Okay for refill? Please advise 

## 2019-12-11 NOTE — Telephone Encounter (Signed)
pt is requesting a refill on  tramadol 50  mg called in at the  [facility] that his preferred pharmacy as well. pt would like a call  when RX ready  contact  [phone]

## 2019-12-11 NOTE — Telephone Encounter (Signed)
I have updated the pharmacy in the patients chart.  [facility] [address] Phone:  [phone]  Fax:  [phone]      The patient doesn't use [facility]

## 2019-12-12 ENCOUNTER — Other Ambulatory Visit: Payer: Self-pay | Admitting: Adult Health

## 2019-12-12 ENCOUNTER — Other Ambulatory Visit: Payer: Self-pay

## 2019-12-12 DIAGNOSIS — M5416 Radiculopathy, lumbar region: Secondary | ICD-10-CM

## 2019-12-12 MED ORDER — TRAMADOL HCL 50 MG PO TABS: 50.0000 mg | ORAL_TABLET | Freq: Three times a day (TID) | ORAL | 0 refills | Status: DC | PRN

## 2019-12-12 NOTE — Telephone Encounter (Signed)
Script sent to pharmacy.

## 2019-12-12 NOTE — Telephone Encounter (Signed)
Ok to send in script tramadol take every 8 hours PRN, 60 tabs, 0 refills

## 2019-12-12 NOTE — Telephone Encounter (Signed)
Called pharmacy and pt has picked up Rx. Nothing further.

## 2019-12-12 NOTE — Telephone Encounter (Signed)
Patient called back stating that the Rx is not at the pharmacy.  I verified the pharmacy and address and everything is correct.  Please advise

## 2019-12-12 NOTE — Telephone Encounter (Signed)
Call pt and advised him that the refill was sent in today

## 2019-12-28 ENCOUNTER — Encounter: Payer: Self-pay | Admitting: Adult Health

## 2020-01-04 DIAGNOSIS — Z86010 Personal history of colonic polyps: Secondary | ICD-10-CM | POA: Diagnosis not present

## 2020-01-04 DIAGNOSIS — K449 Diaphragmatic hernia without obstruction or gangrene: Secondary | ICD-10-CM | POA: Diagnosis not present

## 2020-01-04 DIAGNOSIS — F458 Other somatoform disorders: Secondary | ICD-10-CM | POA: Diagnosis not present

## 2020-01-04 DIAGNOSIS — Z1211 Encounter for screening for malignant neoplasm of colon: Secondary | ICD-10-CM | POA: Diagnosis not present

## 2020-01-30 ENCOUNTER — Other Ambulatory Visit: Payer: Self-pay | Admitting: Gastroenterology

## 2020-01-30 DIAGNOSIS — R1310 Dysphagia, unspecified: Secondary | ICD-10-CM

## 2020-02-08 ENCOUNTER — Ambulatory Visit
Admission: RE | Admit: 2020-02-08 | Discharge: 2020-02-08 | Disposition: A | Discharge status: Home / Self Care | Payer: Medicare HMO | Source: Ambulatory Visit | Attending: Gastroenterology | Admitting: Gastroenterology

## 2020-02-08 DIAGNOSIS — R1310 Dysphagia, unspecified: Secondary | ICD-10-CM

## 2020-02-08 DIAGNOSIS — K449 Diaphragmatic hernia without obstruction or gangrene: Secondary | ICD-10-CM | POA: Diagnosis not present

## 2020-02-08 IMAGING — RF DG ESOPHAGUS
7 series · 14 of 24 positions shown · non-contrast
Comparison: None.

CLINICAL DATA: Dysphasia.

EXAM:
ESOPHOGRAM / BARIUM SWALLOW / BARIUM TABLET STUDY
TECHNIQUE: Combined double contrast and single contrast examination performed
using effervescent crystals, thick barium liquid, and thin barium
liquid. The patient was observed with fluoroscopy swallowing a 13 mm
barium sulphate tablet.
FLUOROSCOPY TIME:  Fluoroscopy Time:  2 minutes
Radiation Exposure Index (if provided by the fluoroscopic device):
Not available
Number of Acquired Spot Images: 0

[Series 1: sequence · 2 of 51 frames shown (1 of 5)]
[frame 8/51]
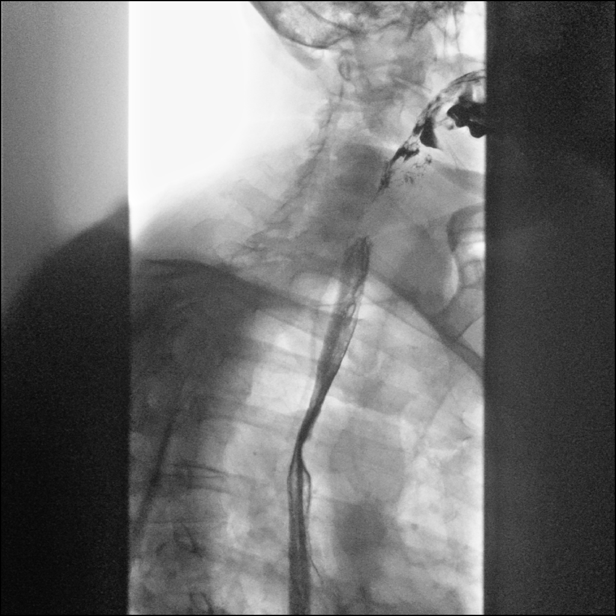
[frame 39/51]
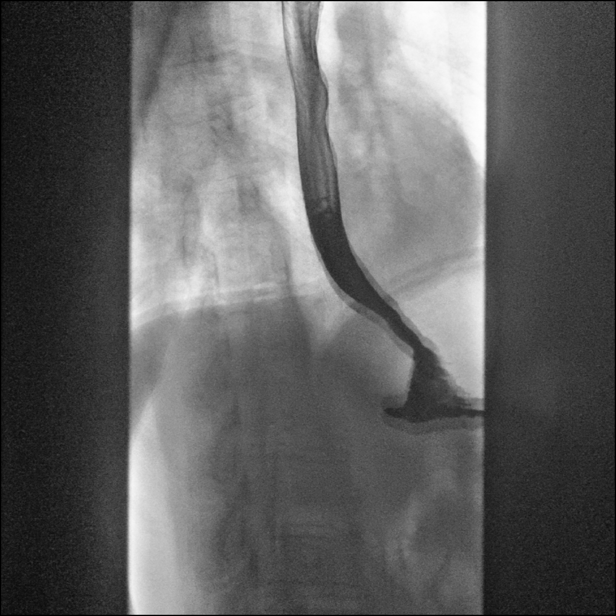

[Series 2: one shot · 1 of 3 slices shown (1 of 2)]
[im 1/3]
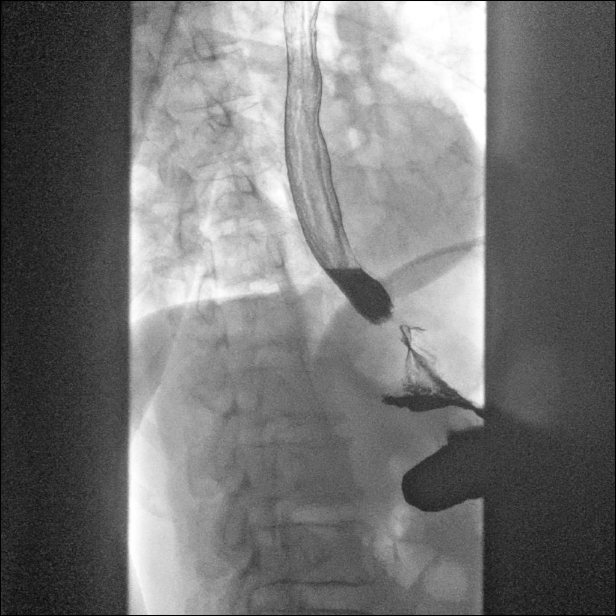

[Series 3: sequence · 3 of 103 frames shown (2 of 5)]
[frame 10/103]
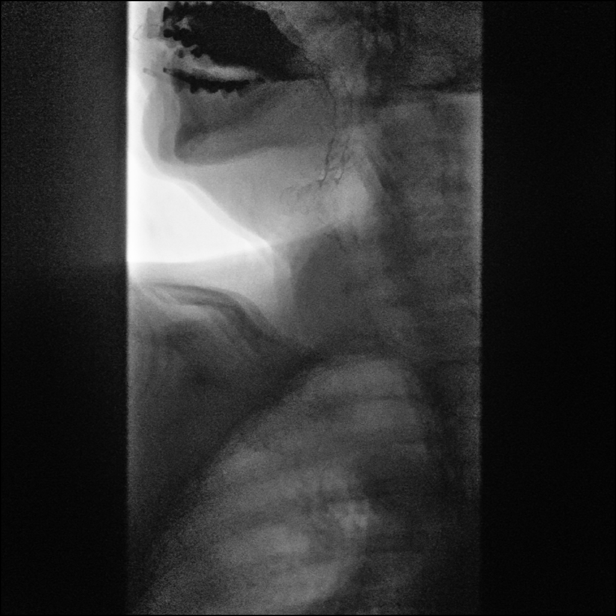
[frame 16/103]
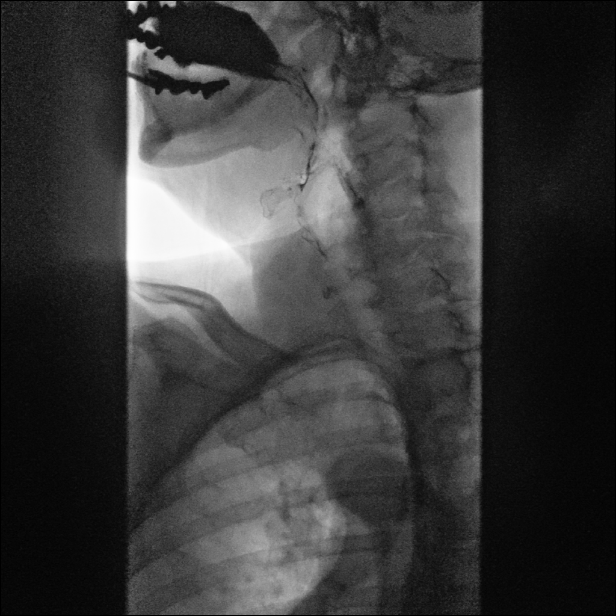
[frame 88/103]
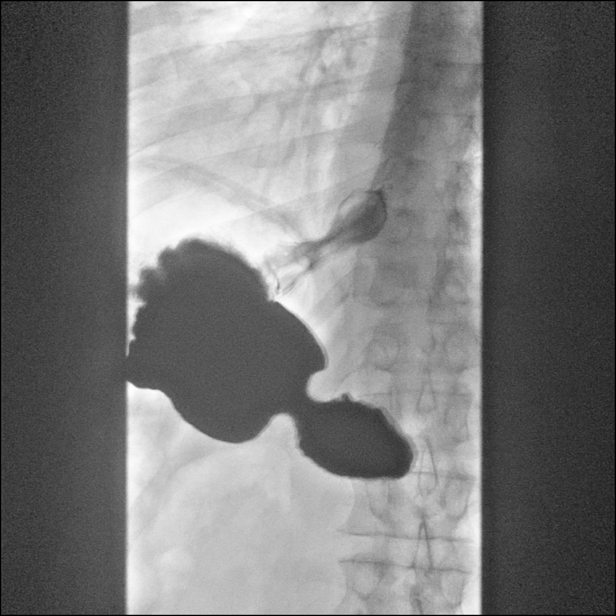

[Series 4: one shot · 2 of 3 slices shown (2 of 2)]
[im 2/3]
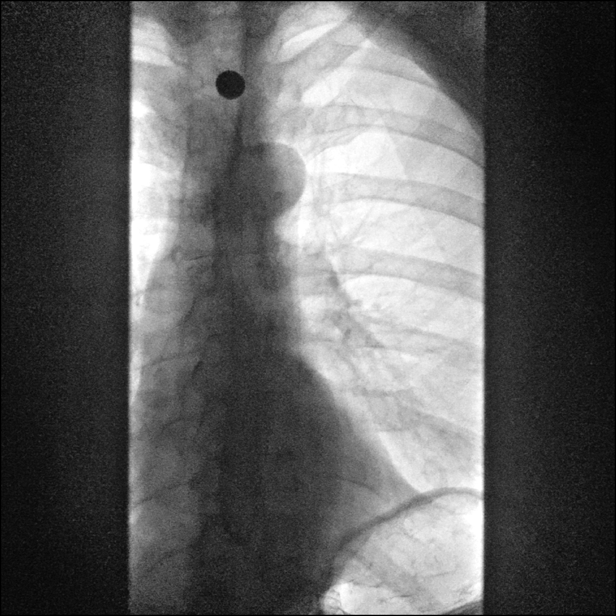
[im 3/3]
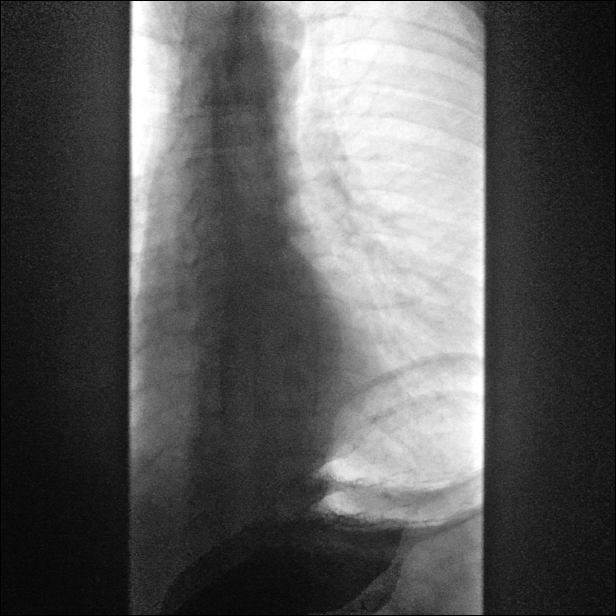

[Series 5: sequence · 2 of 49 frames shown (3 of 5)]
[frame 8/49]
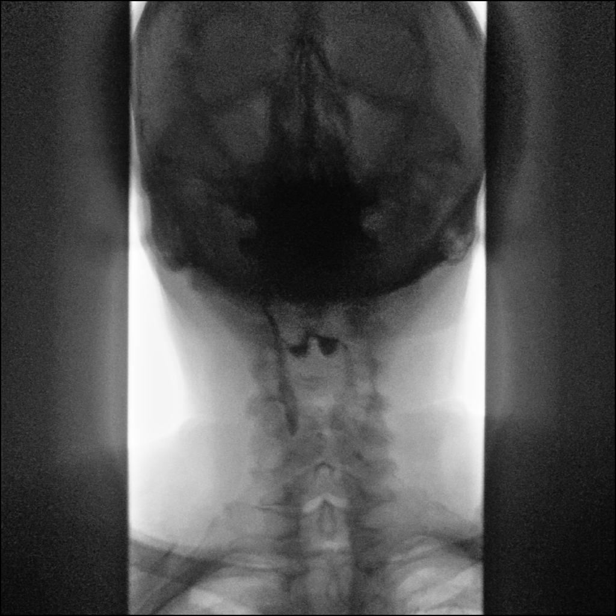
[frame 42/49]
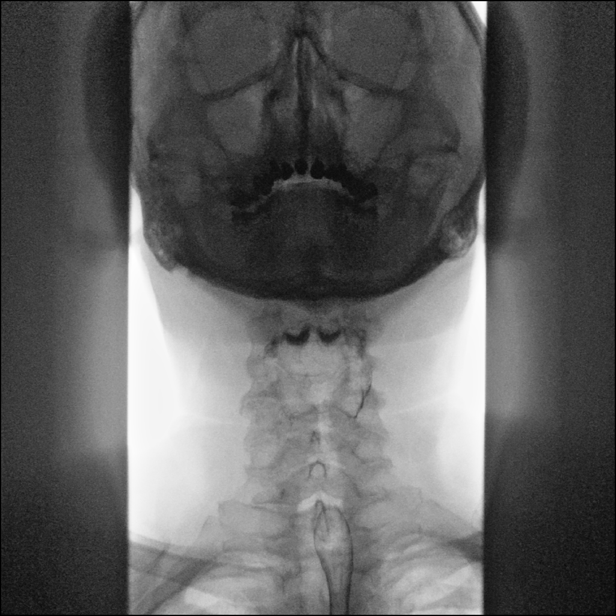

[Series 6: sequence · 2 of 50 frames shown (4 of 5)]
[frame 26/50]
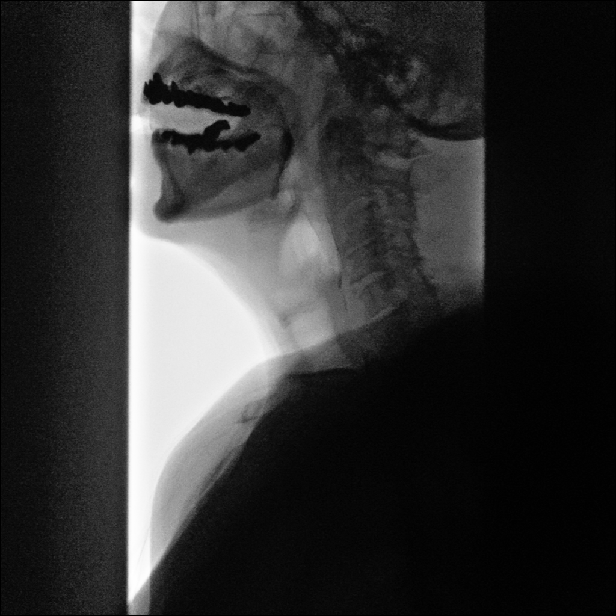
[frame 43/50]
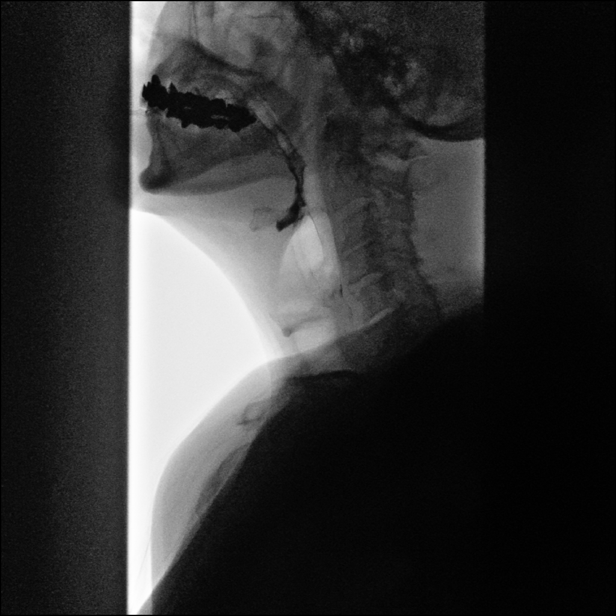

[Series 7: sequence · 2 of 39 frames shown (5 of 5)]
[frame 6/39]
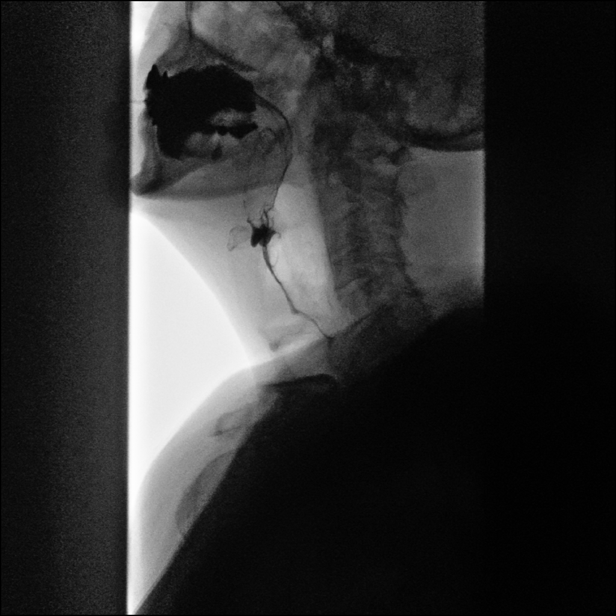
[frame 34/39]
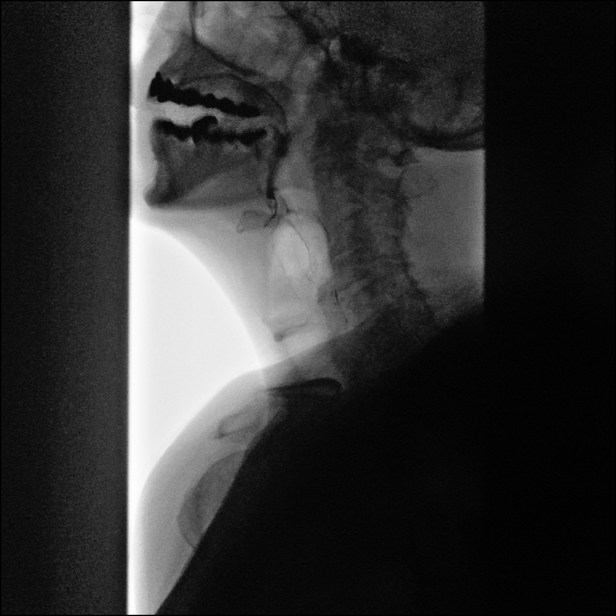

[14 of 24 positions shown; findings below may reference images not displayed]

FINDINGS: The esophagus is patent. No stricture or mass identified. A 13 mm
barium tablet was administered which easily passed through the
esophagus and into the stomach. The motility of the esophagus is
unremarkable. Tiny hiatal hernia noted. No reflux visualized. The
swallowing mechanism was evaluated. Premature spillage to the
vallecula was noted without aspiration.
IMPRESSION: 1. Tiny hiatal hernia.  No reflux.
2. Premature spillage to the vallecula was noted without aspiration.

## 2020-02-14 ENCOUNTER — Encounter: Payer: Self-pay | Admitting: Family Medicine

## 2020-02-19 ENCOUNTER — Encounter: Payer: Self-pay | Admitting: Gastroenterology

## 2020-02-19 DIAGNOSIS — K635 Polyp of colon: Secondary | ICD-10-CM | POA: Diagnosis not present

## 2020-02-19 DIAGNOSIS — Z1211 Encounter for screening for malignant neoplasm of colon: Secondary | ICD-10-CM | POA: Diagnosis not present

## 2020-02-19 DIAGNOSIS — D122 Benign neoplasm of ascending colon: Secondary | ICD-10-CM | POA: Diagnosis not present

## 2020-02-19 DIAGNOSIS — Z86010 Personal history of colonic polyps: Secondary | ICD-10-CM | POA: Diagnosis not present

## 2020-02-19 DIAGNOSIS — F458 Other somatoform disorders: Secondary | ICD-10-CM | POA: Diagnosis not present

## 2020-02-19 LAB — HM COLONOSCOPY

## 2020-02-20 ENCOUNTER — Encounter: Payer: Self-pay | Admitting: Family Medicine

## 2020-05-14 ENCOUNTER — Ambulatory Visit
Admission: EM | Admit: 2020-05-14 | Discharge: 2020-05-14 | Disposition: A | Discharge status: Home / Self Care | Payer: Medicare HMO | Attending: Physician Assistant | Admitting: Physician Assistant

## 2020-05-14 DIAGNOSIS — T23261A Burn of second degree of back of right hand, initial encounter: Secondary | ICD-10-CM | POA: Diagnosis not present

## 2020-05-14 MED ORDER — SILVER SULFADIAZINE 1 % EX CREA: 1.0000 "application " | TOPICAL_CREAM | Freq: Every day | CUTANEOUS | 0 refills | Status: DC
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
## 2020-05-14 NOTE — Discharge Instructions (Addendum)
Dress area with silvadene daily to prevent infection. Try to avoid opening blisters if more fills up. Ice compress 10-33mins at a time, 2-3 times a day. Monitor for spreading redness, warmth, fever, pus like drainage.

## 2020-05-17 ENCOUNTER — Encounter: Payer: Self-pay | Admitting: Adult Health

## 2020-05-17 ENCOUNTER — Ambulatory Visit (INDEPENDENT_AMBULATORY_CARE_PROVIDER_SITE_OTHER): Payer: Medicare HMO | Admitting: Adult Health

## 2020-05-17 ENCOUNTER — Other Ambulatory Visit: Payer: Self-pay

## 2020-05-17 VITALS — BP 130/70 | HR 83 | Temp 98.0°F | Wt 174.6 lb

## 2020-05-17 DIAGNOSIS — X030XXD Exposure to flames in controlled fire, not in building or structure, subsequent encounter: Secondary | ICD-10-CM | POA: Diagnosis not present

## 2020-05-17 DIAGNOSIS — T23191D Burn of first degree of multiple sites of right wrist and hand, subsequent encounter: Secondary | ICD-10-CM

## 2020-05-17 DIAGNOSIS — M353 Polymyalgia rheumatica: Secondary | ICD-10-CM | POA: Diagnosis not present

## 2020-05-17 MED ORDER — SILVER SULFADIAZINE 1 % EX CREA: 1.0000 "application " | TOPICAL_CREAM | Freq: Every day | CUTANEOUS | 0 refills | Status: DC
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
## 2020-05-18 LAB — C-REACTIVE PROTEIN: CRP: 2.8 mg/L (ref <8.0)

## 2020-05-18 LAB — SEDIMENTATION RATE

## 2020-05-18 LAB — SED RATE MANUAL WEST RFLX: SED RATE BY MODIFIED WESTERGREN,MANUAL: 2 mm/h (ref 0–20)

## 2020-05-19 ENCOUNTER — Encounter: Payer: Self-pay | Admitting: Adult Health

## 2020-05-23 ENCOUNTER — Telehealth: Payer: Self-pay | Admitting: Adult Health

## 2020-05-23 NOTE — Telephone Encounter (Signed)
Pt said he is running out of silver sulfADIAZINE (SILVADENE) 1 % cream   And the pharmacist said he cannot get a refill until 08/21  Pharmacy: [facility] [place] [facility] [place]  [address] ([place]) [place] [address]  Phone:  [phone] Fax:  [phone]   Please advise

## 2020-05-24 ENCOUNTER — Encounter: Payer: Self-pay | Admitting: Adult Health

## 2020-05-24 NOTE — Telephone Encounter (Signed)
Insurance

## 2020-05-24 NOTE — Telephone Encounter (Signed)
I am not sure how expensive the large jar of the silvadine cream is with cash pay but the smaller jar I can send in would be about $10 with the Goodrx card

## 2020-05-24 NOTE — Telephone Encounter (Signed)
Tried reaching the pt X2.  Phone will ring with no answer and will then turn into a busy signal.  Will try again at a later time.

## 2020-05-24 NOTE — Telephone Encounter (Signed)
Because the pharmacy does not have any or insurance will not pay for it?

## 2020-05-27 NOTE — Telephone Encounter (Signed)
Spoke to the pt and he informed me that he was able to go by [facility] this weekend and pick up 2 more jars of cream.  Nothing further needed at this time.

## 2020-05-29 ENCOUNTER — Ambulatory Visit: Payer: Medicare HMO | Admitting: Adult Health

## 2020-06-06 ENCOUNTER — Ambulatory Visit (INDEPENDENT_AMBULATORY_CARE_PROVIDER_SITE_OTHER): Payer: Medicare HMO | Admitting: Adult Health

## 2020-06-06 ENCOUNTER — Encounter: Payer: Self-pay | Admitting: Adult Health

## 2020-06-06 ENCOUNTER — Other Ambulatory Visit: Payer: Self-pay

## 2020-06-06 VITALS — BP 124/82 | HR 73 | Temp 97.5°F | Ht 71.75 in | Wt 175.0 lb

## 2020-06-06 DIAGNOSIS — W57XXXA Bitten or stung by nonvenomous insect and other nonvenomous arthropods, initial encounter: Secondary | ICD-10-CM

## 2020-06-06 DIAGNOSIS — S40862A Insect bite (nonvenomous) of left upper arm, initial encounter: Secondary | ICD-10-CM

## 2020-06-06 DIAGNOSIS — T23191D Burn of first degree of multiple sites of right wrist and hand, subsequent encounter: Secondary | ICD-10-CM | POA: Diagnosis not present
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
## 2020-06-06 NOTE — Addendum Note (Signed)
Addended by: Marrion Coy on: 06/06/2020 01:31 PM   Modules accepted: Orders

## 2020-06-06 NOTE — Addendum Note (Signed)
Addended by: Marrion Coy on: 06/06/2020 01:32 PM   Modules accepted: Orders

## 2020-06-07 LAB — B. BURGDORFI ANTIBODIES: B burgdorferi Ab IgG+IgM: <0.9 index

## 2020-06-17 ENCOUNTER — Telehealth: Payer: Self-pay | Admitting: Adult Health

## 2020-06-17 NOTE — Progress Notes (Signed)
°  Chronic Care Management   Note  06/17/2020 Name: Brandon Dixon MRN: [id number] DOB: 1946-08-01  Brandon Dixon is a 74 y.o. year old male who is a primary care patient of Dorothyann Peng, NP. I reached out to [name] by phone today in response to a referral sent by Brandon Dixon PCP, Dorothyann Peng, NP.   Mr. Frangos was given information about Chronic Care Management services today including:  1. CCM service includes personalized support from designated clinical staff supervised by his physician, including individualized plan of care and coordination with other care providers 2. 24/7 contact phone numbers for assistance for urgent and routine care needs. 3. Service will only be billed when office clinical staff spend 20 minutes or more in a month to coordinate care. 4. Only one practitioner may furnish and bill the service in a calendar month. 5. The patient may stop CCM services at any time (effective at the end of the month) by phone call to the office staff.   Patient wishes to consider information provided and/or speak with a member of the care team before deciding about enrollment in care management services.   Follow up plan:   Carley Perdue [employer] Scheduler

## 2020-06-18 ENCOUNTER — Telehealth: Payer: Self-pay | Admitting: Adult Health

## 2020-06-18 NOTE — Progress Notes (Signed)
  Chronic Care Management   Note  06/18/2020 Name: Brandon Dixon MRN: [id number] DOB: 12/09/1945  Brandon Dixon is a 74 y.o. year old male who is a primary care patient of Dorothyann Peng, NP. I reached out to [name] by phone today in response to a referral sent by Mr. [place] PCP, Dorothyann Peng, NP.   Mr. Stefan was given information about Chronic Care Management services today including:  1. CCM service includes personalized support from designated clinical staff supervised by his physician, including individualized plan of care and coordination with other care providers 2. 24/7 contact phone numbers for assistance for urgent and routine care needs. 3. Service will only be billed when office clinical staff spend 20 minutes or more in a month to coordinate care. 4. Only one practitioner may furnish and bill the service in a calendar month. 5. The patient may stop CCM services at any time (effective at the end of the month) by phone call to the office staff.   Patient agreed to services and verbal consent obtained.   Follow up plan:   Carley Perdue [employer] Scheduler

## 2020-06-18 NOTE — Progress Notes (Signed)
  Chronic Care Management   Outreach Note  06/18/2020 Name: Brandon Dixon MRN: [id number] DOB: October 03, 1946  Referred by: Dorothyann Peng, NP Reason for referral : No chief complaint on file.   An unsuccessful telephone outreach was attempted today. The patient was referred to the pharmacist for assistance with care management and care coordination.   Follow Up Plan:   Carley Perdue [employer] Scheduler

## 2020-06-18 NOTE — Telephone Encounter (Signed)
Left message for patient to schedule Annual Wellness Visit.  Please schedule with Nurse Health Advisor Shannon Crews, RN at [facility]  

## 2020-06-26 ENCOUNTER — Other Ambulatory Visit: Payer: Self-pay

## 2020-06-26 ENCOUNTER — Ambulatory Visit (INDEPENDENT_AMBULATORY_CARE_PROVIDER_SITE_OTHER): Payer: Medicare HMO

## 2020-06-26 VITALS — BP 118/76 | HR 75 | Temp 98.3°F | Resp 15 | Ht 68.0 in | Wt 173.1 lb

## 2020-06-26 DIAGNOSIS — Z0000 Encounter for general adult medical examination without abnormal findings: Secondary | ICD-10-CM

## 2020-06-26 NOTE — Patient Instructions (Addendum)
Brandon Dixon , Thank you for taking time to come for your Medicare Wellness Visit. I appreciate your ongoing commitment to your health goals. Please review the following plan we discussed and let me know if I can assist you in the future.   Screening recommendations/referrals: Colonoscopy: No longer required  Recommended yearly ophthalmology/optometry visit for glaucoma screening and checkup Recommended yearly dental visit for hygiene and checkup  Vaccinations: Influenza vaccine: Currently due, you may receive this at your pharmacy or in our office  Pneumococcal vaccine: Completed series Tdap vaccine: Up to date, next due  03/09/2028 Shingles vaccine: Currently due, please contact your pharmacy to discuss cost and to receive the vaccines    Advanced directives: Please bring a copy of your advanced directives to our office at your next office visit so that we may scan it into your chart  Conditions/risks identified: None   Next appointment: 07/24/2020 @ 11:00 am via telephone with Pharmacist at [facility] 74 Years and Older, Male Preventive care refers to lifestyle choices and visits with your health care provider that can promote health and wellness. What does preventive care include?  A yearly physical exam. This is also called an annual well check.  Dental exams once or twice a year.  Routine eye exams. Ask your health care provider how often you should have your eyes checked.  Personal lifestyle choices, including:  Daily care of your teeth and gums.  Regular physical activity.  Eating a healthy diet.  Avoiding tobacco and drug use.  Limiting alcohol use.  Practicing safe sex.  Taking low doses of aspirin every day.  Taking vitamin and mineral supplements as recommended by your health care provider. What happens during an annual well check? The services and screenings done by your health care provider during your annual well check will depend on  your age, overall health, lifestyle risk factors, and family history of disease. Counseling  Your health care provider may ask you questions about your:  Alcohol use.  Tobacco use.  Drug use.  Emotional well-being.  Home and relationship well-being.  Sexual activity.  Eating habits.  History of falls.  Memory and ability to understand (cognition).  Work and work Statistician. Screening  You may have the following tests or measurements:  Height, weight, and BMI.  Blood pressure.  Lipid and cholesterol levels. These may be checked every 5 years, or more frequently if you are over 20 years old.  Skin check.  Lung cancer screening. You may have this screening every year starting at age 35 if you have a 30-pack-year history of smoking and currently smoke or have quit within the past 15 years.  Fecal occult blood test (FOBT) of the stool. You may have this test every year starting at age 74.  Flexible sigmoidoscopy or colonoscopy. You may have a sigmoidoscopy every 5 years or a colonoscopy every 10 years starting at age 41.  Prostate cancer screening. Recommendations will vary depending on your family history and other risks.  Hepatitis C blood test.  Hepatitis B blood test.  Sexually transmitted disease (STD) testing.  Diabetes screening. This is done by checking your blood sugar (glucose) after you have not eaten for a while (fasting). You may have this done every 1-3 years.  Abdominal aortic aneurysm (AAA) screening. You may need this if you are a current or former smoker.  Osteoporosis. You may be screened starting at age 74 if you are at high risk. Talk with your health care  provider about your test results, treatment options, and if necessary, the need for more tests. Vaccines  Your health care provider may recommend certain vaccines, such as:  Influenza vaccine. This is recommended every year.  Tetanus, diphtheria, and acellular pertussis (Tdap, Td) vaccine.  You may need a Td booster every 10 years.  Zoster vaccine. You may need this after age 74.  Pneumococcal 13-valent conjugate (PCV13) vaccine. One dose is recommended after age 14.  Pneumococcal polysaccharide (PPSV23) vaccine. One dose is recommended after age 38. Talk to your health care provider about which screenings and vaccines you need and how often you need them. This information is not intended to replace advice given to you by your health care provider. Make sure you discuss any questions you have with your health care provider. Document Released: 11/01/2015 Document Revised: 06/24/2016 Document Reviewed: 08/06/2015 Elsevier Interactive Patient Education  2017 [place] Prevention in the Home Falls can cause injuries. They can happen to people of all ages. There are many things you can do to make your home safe and to help prevent falls. What can I do on the outside of my home?  Regularly fix the edges of walkways and driveways and fix any cracks.  Remove anything that might make you trip as you walk through a door, such as a raised step or threshold.  Trim any bushes or trees on the path to your home.  Use bright outdoor lighting.  Clear any walking paths of anything that might make someone trip, such as rocks or tools.  Regularly check to see if handrails are loose or broken. Make sure that both sides of any steps have handrails.  Any raised decks and porches should have guardrails on the edges.  Have any leaves, snow, or ice cleared regularly.  Use sand or salt on walking paths during winter.  Clean up any spills in your garage right away. This includes oil or grease spills. What can I do in the bathroom?  Use night lights.  Install grab bars by the toilet and in the tub and shower. Do not use towel bars as grab bars.  Use non-skid mats or decals in the tub or shower.  If you need to sit down in the shower, use a plastic, non-slip stool.  Keep the  floor dry. Clean up any water that spills on the floor as soon as it happens.  Remove soap buildup in the tub or shower regularly.  Attach bath mats securely with double-sided non-slip rug tape.  Do not have throw rugs and other things on the floor that can make you trip. What can I do in the bedroom?  Use night lights.  Make sure that you have a light by your bed that is easy to reach.  Do not use any sheets or blankets that are too big for your bed. They should not hang down onto the floor.  Have a firm chair that has side arms. You can use this for support while you get dressed.  Do not have throw rugs and other things on the floor that can make you trip. What can I do in the kitchen?  Clean up any spills right away.  Avoid walking on wet floors.  Keep items that you use a lot in easy-to-reach places.  If you need to reach something above you, use a strong step stool that has a grab bar.  Keep electrical cords out of the way.  Do not use floor polish  or wax that makes floors slippery. If you must use wax, use non-skid floor wax.  Do not have throw rugs and other things on the floor that can make you trip. What can I do with my stairs?  Do not leave any items on the stairs.  Make sure that there are handrails on both sides of the stairs and use them. Fix handrails that are broken or loose. Make sure that handrails are as long as the stairways.  Check any carpeting to make sure that it is firmly attached to the stairs. Fix any carpet that is loose or worn.  Avoid having throw rugs at the top or bottom of the stairs. If you do have throw rugs, attach them to the floor with carpet tape.  Make sure that you have a light switch at the top of the stairs and the bottom of the stairs. If you do not have them, ask someone to add them for you. What else can I do to help prevent falls?  Wear shoes that:  Do not have high heels.  Have rubber bottoms.  Are comfortable and fit  you well.  Are closed at the toe. Do not wear sandals.  If you use a stepladder:  Make sure that it is fully opened. Do not climb a closed stepladder.  Make sure that both sides of the stepladder are locked into place.  Ask someone to hold it for you, if possible.  Clearly mark and make sure that you can see:  Any grab bars or handrails.  First and last steps.  Where the edge of each step is.  Use tools that help you move around (mobility aids) if they are needed. These include:  Canes.  Walkers.  Scooters.  Crutches.  Turn on the lights when you go into a dark area. Replace any light bulbs as soon as they burn out.  Set up your furniture so you have a clear path. Avoid moving your furniture around.  If any of your floors are uneven, fix them.  If there are any pets around you, be aware of where they are.  Review your medicines with your doctor. Some medicines can make you feel dizzy. This can increase your chance of falling. Ask your doctor what other things that you can do to help prevent falls. This information is not intended to replace advice given to you by your health care provider. Make sure you discuss any questions you have with your health care provider. Document Released: 08/01/2009 Document Revised: 03/12/2016 Document Reviewed: 11/09/2014 Elsevier Interactive Patient Education  2017 [employer].

## 2020-06-26 NOTE — Progress Notes (Addendum)
Subjective:   Brandon Dixon is a 74 y.o. male who presents for Medicare Annual/Subsequent preventive examination.  Review of Systems    N/A Cardiac Risk Factors include: advanced age (>64men, >31 women);male gender     Objective:    Today's Vitals   06/26/20 1115  BP: 118/76  Pulse: 75  Resp: 15  Temp: 98.3 F (36.8 C)  TempSrc: Oral  SpO2: 95%  Weight: 173 lb 2 oz (78.5 kg)  Height: 5\' 8"  (1.727 m)   Body mass index is 26.32 kg/m.  Advanced Directives 06/26/2020 01/12/2018 05/23/2014 04/30/2014  Does Patient Have a Medical Advance Directive? Yes Yes Patient has advance directive, copy in chart Patient has advance directive, copy not in chart  Type of Advance Directive [place];Living will Living will;Healthcare Power of Attorney - Living will;Healthcare Power of Attorney  Does patient want to make changes to medical advance directive? No - Patient declined - - No change requested  Copy of [place] in Chart? No - copy requested - - -    Current Medications (verified) Outpatient Encounter Medications as of 06/26/2020  Medication Sig  . aspirin EC 81 MG tablet Take 81 mg by mouth daily.  Marland Kitchen atorvastatin (LIPITOR) 80 MG tablet Take 1 tablet (80 mg total) by mouth daily.  . fluticasone (FLONASE) 50 MCG/ACT nasal spray PLACE 2 SPRAYS INTO BOTH NOSTRILS DAILY.  Marland Kitchen predniSONE (DELTASONE) 2.5 MG tablet Take 2 tablets every morning  . tamsulosin (FLOMAX) 0.4 MG CAPS capsule TAKE 1 CAPSULE  DAILY AFTER SUPPER.  Marland Kitchen triamcinolone cream (KENALOG) 0.1 % Apply 1 application topically 2 (two) times daily.  . Calcium 600-200 MG-UNIT per tablet Take 1 tablet by mouth daily. (Patient not taking: Reported on 06/26/2020)  . EPINEPHrine 0.3 mg/0.3 mL IJ SOAJ injection Inject 0.3 mLs (0.3 mg total) into the muscle once. (Patient not taking: Reported on 06/26/2020)  . FLUZONE HIGH-DOSE QUADRIVALENT 0.7 ML SUSY  (Patient not taking: Reported on 06/26/2020)  . naproxen  (NAPROSYN) 500 MG tablet Take 1 tablet (500 mg total) by mouth 2 (two) times daily with a meal. (Patient not taking: Reported on 06/26/2020)  . PNEUMOVAX 23 25 MCG/0.5ML injection  (Patient not taking: Reported on 06/26/2020)  . sildenafil (REVATIO) 20 MG tablet 2 tablets daily as needed (Patient not taking: Reported on 06/26/2020)  . silver sulfADIAZINE (SILVADENE) 1 % cream Apply 1 application topically daily. (Patient not taking: Reported on 06/26/2020)   No facility-administered encounter medications on file as of 06/26/2020.    Allergies (verified) Patient has no known allergies.   History: Past Medical History:  Diagnosis Date  . ALLERGIC RHINITIS 09/26/2007  . BENIGN PROSTATIC HYPERTROPHY 05/27/2007  . COLONIC POLYPS, HX OF 05/27/2007  . DERMATITIS 08/25/2010  . GERD 05/27/2007  . HYPERCHOLESTEROLEMIA 09/26/2007  . Peripheral vascular disease [facility])    Past Surgical History:  Procedure Laterality Date  . LOWER EXTREMITY ANGIOGRAM Left 04/30/2014   Procedure: LOWER EXTREMITY ANGIOGRAM;  Surgeon: Angelia Mould, MD;  Location: [facility] CATH LAB;  Service: Cardiovascular;  Laterality: Left;  . ROTATOR CUFF REPAIR    . VASECTOMY     Family History  Problem Relation Age of Onset  . Heart disease Mother   . Heart attack Mother        75   . Cancer Father        lung ca  . Hypertension Sister   . Diabetes Brother   . Hypertension Sister   .  Diabetes Brother   . Diabetes Brother   . Diabetes Brother   . Diabetes Brother   . Diabetes Daughter    Social History   Socioeconomic History  . Marital status: Married    Spouse name: Not on file  . Number of children: Not on file  . Years of education: Not on file  . Highest education level: Not on file  Occupational History  . Not on file  Tobacco Use  . Smoking status: Former Smoker    Quit date: 10/19/2001    Years since quitting: 18.6  . Smokeless tobacco: Never Used  Substance and Sexual Activity  . Alcohol use: Yes     Alcohol/week: 6.0 standard drinks    Types: 6 Cans of beer per week  . Drug use: No  . Sexual activity: Not on file  Other Topics Concern  . Not on file  Social History Narrative   Works as a Physicist, medical    Married    Three daughters    Social Determinants of Radio broadcast assistant Strain: Low Risk   . Difficulty of Paying Living Expenses: Not hard at all  Food Insecurity: No Food Insecurity  . Worried About Charity fundraiser in the Last Year: Never true  . Ran Out of Food in the Last Year: Never true  Transportation Needs: No Transportation Needs  . Lack of Transportation (Medical): No  . Lack of Transportation (Non-Medical): No  Physical Activity: Inactive  . Days of Exercise per Week: 0 days  . Minutes of Exercise per Session: 0 min  Stress: No Stress Concern Present  . Feeling of Stress : Not at all  Social Connections: Moderately Integrated  . Frequency of Communication with Friends and Family: More than three times a week  . Frequency of Social Gatherings with Friends and Family: Not on file  . Attends Religious Services: More than 4 times per year  . Active Member of Clubs or Organizations: No  . Attends Archivist Meetings: Never  . Marital Status: Married    Tobacco Counseling Counseling given: Not Answered   Clinical Intake:  Pre-visit preparation completed: Yes  Pain : No/denies pain     Nutritional Status: BMI 25 -29 Overweight Nutritional Risks: None Diabetes: No  How often do you need to have someone help you when you read instructions, pamphlets, or other written materials from your doctor or pharmacy?: 1 - Never What is the last grade level you completed in school?: 1 year of college  Diabetic?No  Interpreter Needed?: No  Information entered by :: [place] of Daily Living In your present state of health, do you have any difficulty performing the following activities: 06/26/2020 12/01/2019  Hearing? N N    Vision? N N  Difficulty concentrating or making decisions? N N  Walking or climbing stairs? N Y  Comment - walking due to bac prob  Dressing or bathing? N N  Doing errands, shopping? N N  Preparing Food and eating ? N -  Using the Toilet? N -  In the past six months, have you accidently leaked urine? N -  Do you have problems with loss of bowel control? N -  Managing your Medications? N -  Housekeeping or managing your Housekeeping? N -  Some recent data might be hidden    Patient Care Team: Dorothyann Peng, NP as PCP - General (Family Medicine) Juanita Craver, MD as Consulting Physician (Gastroenterology) Erskin Burnet, [facility]  as Pharmacist Warehouse manager)  Indicate any recent Medical Services you may have received from other than [facility] providers in the past year (date may be approximate).     Assessment:   This is a routine wellness examination for [place].  Hearing/Vision screen  Hearing Screening   125Hz  250Hz  500Hz  1000Hz  2000Hz  3000Hz  4000Hz  6000Hz  8000Hz   Right ear:           Left ear:           Vision Screening Comments: Patient states gets eyes checked annually   Dietary issues and exercise activities discussed: Current Exercise Habits: The patient has a physically strenuous job, but has no regular exercise apart from work.  Goals    . Patient Stated     I will continue to play golf.      Depression Screen PHQ 2/9 Scores 06/26/2020 12/01/2019 11/29/2018 11/29/2017 11/20/2016 11/19/2015 11/15/2014  PHQ - 2 Score 0 0 0 0 1 0 0  PHQ- 9 Score 0 - - - - - -    Fall Risk Fall Risk  06/26/2020 12/01/2019 11/29/2018 11/29/2017 11/20/2016  Falls in the past year? 0 0 0 Yes No  Number falls in past yr: 0 0 - - -  Injury with Fall? 0 0 - - -  Risk for fall due to : - - - - -  Follow up Falls evaluation completed;Falls prevention discussed - - - -    Any stairs in or around the home? Yes  If so, are there any without handrails? No  Home free of loose throw rugs in walkways, pet  beds, electrical cords, etc? Yes  Adequate lighting in your home to reduce risk of falls? Yes   ASSISTIVE DEVICES UTILIZED TO PREVENT FALLS:  Life alert? No  Use of a cane, walker or w/c? No  Grab bars in the bathroom? No  Shower chair or bench in shower? No  Elevated toilet seat or a handicapped toilet? No   TIMED UP AND GO:  Was the test performed? Yes .  Length of time to ambulate 10 feet: 8  sec.   Gait steady and fast without use of assistive device  Cognitive Function:     6CIT Screen 06/26/2020  What Year? 0 points  What month? 0 points  What time? 0 points  Count back from 20 0 points  Months in reverse 0 points  Repeat phrase 2 points  Total Score 2    Immunizations Immunization History  Administered Date(s) Administered  . Influenza Split 08/30/2013  . Influenza Whole 07/30/2010  . Influenza, High Dose Seasonal PF 07/20/2014, 07/02/2015, 07/15/2016, 07/04/2019  . Influenza-Unspecified 08/05/2017, 07/22/2018  . Pneumococcal Conjugate-13 11/14/2013, 07/22/2018  . Pneumococcal Polysaccharide-23 10/23/2011, 07/22/2018, 07/04/2019  . Tdap 03/09/2018    TDAP status: Up to date Flu Vaccine status: Declined, Education has been provided regarding the importance of this vaccine but patient still declined. Advised may receive this vaccine at local pharmacy or Health Dept. Aware to provide a copy of the vaccination record if obtained from local pharmacy or Health Dept. Verbalized acceptance and understanding. Pneumococcal vaccine status: Up to date Covid-19 vaccine status: Completed vaccines  Qualifies for Shingles Vaccine? Yes   Zostavax completed No   Shingrix Completed?: No.    Education has been provided regarding the importance of this vaccine. Patient has been advised to call insurance company to determine out of pocket expense if they have not yet received this vaccine. Advised may also receive vaccine at local pharmacy  or Health Dept. Verbalized acceptance and  understanding.  Screening Tests Health Maintenance  Topic Date Due  . COVID-19 Vaccine (1) Never done  . INFLUENZA VACCINE  05/19/2020  . TETANUS/TDAP  03/09/2028  . Hepatitis C Screening  Completed  . PNA vac Low Risk Adult  Completed    Health Maintenance  Health Maintenance Due  Topic Date Due  . COVID-19 Vaccine (1) Never done  . INFLUENZA VACCINE  05/19/2020    Colorectal cancer screening: Completed 03/18/2020. Repeat every 0 years  Lung Cancer Screening: (Low Dose CT Chest recommended if Age 85-80 years, 30 pack-year currently smoking OR have quit w/in 15years.) does not qualify.   Lung Cancer Screening Referral: N/A  Additional Screening:  Hepatitis C Screening: does qualify; Completed 11/19/2015   Vision Screening: Recommended annual ophthalmology exams for early detection of glaucoma and other disorders of the eye. Is the patient up to date with their annual eye exam?  Yes  Who is the provider or what is the name of the office in which the patient attends annual eye exams? [facility] If pt is not established with a provider, would they like to be referred to a provider to establish care? No .   Dental Screening: Recommended annual dental exams for proper oral hygiene  Community Resource Referral / Chronic Care Management: CRR required this visit?  No   CCM required this visit?  No      Plan:     I have personally reviewed and noted the following in the patient's chart:   . Medical and social history . Use of alcohol, tobacco or illicit drugs  . Current medications and supplements . Functional ability and status . Nutritional status . Physical activity . Advanced directives . List of other physicians . Hospitalizations, surgeries, and ER visits in previous 12 months . Vitals . Screenings to include cognitive, depression, and falls . Referrals and appointments  In addition, I have reviewed and discussed with patient certain preventive  protocols, quality metrics, and best practice recommendations. A written personalized care plan for preventive services as well as general preventive health recommendations were provided to patient.     Ofilia Neas, LPN   01/25/1790   Nurse Notes: None

## 2020-07-03 DIAGNOSIS — H5210 Myopia, unspecified eye: Secondary | ICD-10-CM | POA: Diagnosis not present

## 2020-07-04 ENCOUNTER — Other Ambulatory Visit: Payer: Self-pay | Admitting: *Deleted

## 2020-07-04 MED ORDER — FLUTICASONE PROPIONATE 50 MCG/ACT NA SUSP: NASAL | 4 refills | Status: DC

## 2020-07-19 ENCOUNTER — Other Ambulatory Visit: Payer: Self-pay | Admitting: Adult Health

## 2020-07-19 DIAGNOSIS — E7800 Pure hypercholesterolemia, unspecified: Secondary | ICD-10-CM

## 2020-07-19 DIAGNOSIS — I70211 Atherosclerosis of native arteries of extremities with intermittent claudication, right leg: Secondary | ICD-10-CM
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
## 2020-07-23 ENCOUNTER — Telehealth: Payer: Self-pay | Admitting: Adult Health

## 2020-07-23 NOTE — Progress Notes (Signed)
Called "home" number, that is patients wife's number. Please call cell phone number to reach patient.     Chronic Care Management   Note  07/23/2020 Name: Brandon Dixon MRN: [id number] DOB: Feb 20, 1946  Brandon Dixon is a 74 y.o. year old male who is a primary care patient of Dorothyann Peng, NP. I reached out to [name] by phone today in response to a referral sent by [name] PCP, Dorothyann Peng, NP.   [name] was given information about Chronic Care Management services today including:  1. CCM service includes personalized support from designated clinical staff supervised by his physician, including individualized plan of care and coordination with other care providers 2. 24/7 contact phone numbers for assistance for urgent and routine care needs. 3. Service will only be billed when office clinical staff spend 20 minutes or more in a month to coordinate care. 4. Only one practitioner may furnish and bill the service in a calendar month. 5. The patient may stop CCM services at any time (effective at the end of the month) by phone call to the office staff.   Patient agreed to services and verbal consent obtained.   Follow up plan:   Carley Perdue [facility] Scheduler

## 2020-07-24 ENCOUNTER — Telehealth: Payer: Medicare HMO

## 2020-08-30 DIAGNOSIS — Z20822 Contact with and (suspected) exposure to covid-19: Secondary | ICD-10-CM | POA: Diagnosis not present

## 2020-09-19 ENCOUNTER — Telehealth: Payer: Self-pay | Admitting: Pharmacist

## 2020-09-19 ENCOUNTER — Telehealth: Payer: Medicare HMO

## 2020-09-19 NOTE — Chronic Care Management (AMB) (Signed)
° ° °  Chronic Care Management Pharmacy Assistant   Name: Brandon Dixon  MRN: [id number] DOB: 01-24-1946  Reason for Encounter: Medication Review/Initial Questions for Pharmacist visit on 09-23-2020  Patient Questions: 1. Have you seen any other providers since your last visit? No 2. Any changes in your medications or health? No 3. Any side effects from any medications? No 4. Do you have any symptoms or problems not managed by your medications? No 5. Any concerns about your health right now? No 6. Has your provider asked that you check blood pressure, blood sugar, or follow a special diet at home? No 7. Do you get any type of exercise regularly?  a. Daily  8. Can you think of a goal you would like to reach for your health?  a. He about like to lose about 5-10 lbs. 9. Do you have any problems getting your medications? 10. Is there anything that you would like to discuss during the appointment? No  The patient was asked to please bring medications, blood pressure/ blood sugar log, and supplements to His appointment.    PCP : Dorothyann Peng, NP  Allergies:  No Known Allergies  Medications: Outpatient Encounter Medications as of 09/19/2020  Medication Sig   aspirin EC 81 MG tablet Take 81 mg by mouth daily.   atorvastatin (LIPITOR) 80 MG tablet Take 1 tablet (80 mg total) by mouth daily.   EPINEPHrine 0.3 mg/0.3 mL IJ SOAJ injection Inject 0.3 mLs (0.3 mg total) into the muscle once. (Patient not taking: Reported on 06/26/2020)   fluticasone (FLONASE) 50 MCG/ACT nasal spray PLACE 2 SPRAYS INTO BOTH NOSTRILS DAILY.   predniSONE (DELTASONE) 2.5 MG tablet Take 2 tablets every morning   tamsulosin (FLOMAX) 0.4 MG CAPS capsule TAKE 1 CAPSULE  DAILY AFTER SUPPER.   triamcinolone cream (KENALOG) 0.1 % Apply 1 application topically 2 (two) times daily.   No facility-administered encounter medications on file as of 09/19/2020.    Current Diagnosis: Patient Active Problem List    Diagnosis Date Noted   Atherosclerosis of native arteries of extremity with intermittent claudication ([place]) 04/18/2014   Left leg claudication ([place]) 04/05/2014   Polymyalgia rheumatica ([place]) 02/26/2014   DERMATITIS 08/25/2010   HYPERCHOLESTEROLEMIA 09/26/2007   Allergic rhinitis 09/26/2007   GERD 05/27/2007   BPH (benign prostatic hyperplasia) 05/27/2007   History of colonic polyps 05/27/2007    Goals Addressed   None     Follow-Up:  Pharmacist Review   Maia Breslow, [place] Assistant [phone]

## 2020-09-23 ENCOUNTER — Ambulatory Visit: Payer: Medicare HMO | Admitting: Pharmacist

## 2020-09-23 DIAGNOSIS — E7800 Pure hypercholesterolemia, unspecified: Secondary | ICD-10-CM

## 2020-09-23 DIAGNOSIS — M353 Polymyalgia rheumatica: Secondary | ICD-10-CM
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
## 2020-09-23 NOTE — Patient Instructions (Addendum)
Hi Brandon Dixon!  It was lovely to get to speak with you over the phone today! As we discussed, definitely keep up the good work with exercising and eating healthy. I attached some more information that might be helpful to lower your cholesterol. Below is a summary of the topics we discussed.   Please give me a call if you have any questions or need anything before our touch base in 6 months!  Best, Maddie  Jeni Salles, PharmD [facility] Clinical Pharmacist [place] [facility] [place]    Visit Information  Goals Addressed            This Visit's Progress   . Pharmacy Care Plan       CARE PLAN ENTRY (see longitudinal plan of care for additional care plan information)  Current Barriers:  . Chronic Disease Management support, education, and care coordination needs related to Hyperlipidemia, BPH, Allergic Rhinitis, and polymyalgia rheumatica   BPH Lab Results  Component Value Date   PSA 0.80 12/01/2019   PSA 0.66 11/29/2018   PSA 0.61 10/23/2011   . Pharmacist Clinical Goal(s): o Over the next 180 days, patient will work with PharmD and providers to managed symptoms of enlarged prostate . Current regimen:  o Tamsulosin 0.4 mg 1 capsule daily after supper . Interventions: o Discussed consistent administration 30 minutes to 1 hour after a meal to help with absorption . Patient self care activities - Over the next 180 days, patient will: o Continue current medication  Hyperlipidemia Lab Results  Component Value Date/Time   LDLCALC 103 (H) 12/01/2019 10:30 AM   LDLDIRECT 193.1 09/22/2006 10:54 AM   . Pharmacist Clinical Goal(s): o Over the next 180 days, patient will work with PharmD and providers to achieve LDL goal < 100 . Current regimen:  o Atorvastatin 80 mg 1 tablet daily . Interventions: o Discussed lowering cholesterol through diet by: Marland Kitchen Limiting foods with cholesterol such as liver and other organ meats, egg yolks, shrimp, and whole milk dairy  products . Avoiding saturated fats and trans fats and incorporating healthier fats, such as lean meat, nuts, and unsaturated oils like canola and olive oils . Eating foods with soluble fiber such as whole-grain cereals such as oatmeal and oat bran, fruits such as apples, bananas, oranges, pears, and prunes, legumes such as kidney beans, lentils, chick peas, black-eyed peas, and lima beans, and green leafy vegetables . Limiting alcohol intake o Discussed recommendations for moderate aerobic exercise for 150 minutes/week spread out over 5 days for heart healthy lifestyle  . Patient self care activities - Over the next 180 days, patient will: o Continue current medication  Allergic rhinitis . Pharmacist Clinical Goal(s): o Over the next 180 days, patient will work with PharmD and providers to manage symptoms of allergies . Current regimen:  . Flonase 50 mcg/act nasal spray 2 sprays in both nostrils daily . Epipen as needed . Interventions: o Discussed avoiding allergy triggers . Patient self care activities - Over the next 180 days, patient will: o Continue current medications  Polymyalgia rheumatica . Pharmacist Clinical Goal(s) o Over the next 180 days, patient will work with PharmD and providers to manage symptoms of polymyalgia rheumatica . Current regimen:  o Prednisone 2.5 mg 1 tablet every morning  . Interventions: o Discussed supplementing with calcium and vitamin D with long term use of prednisone . Patient self care activities - Over the next 180 days, patient will: o Continue current medication   Medication management . Pharmacist  Clinical Goal(s): o Over the next 180 days, patient will work with PharmD and providers to maintain optimal medication adherence . Current pharmacy: [facility] mail order pharmacy . Interventions o Comprehensive medication review performed. o Continue current medication management strategy . Patient self care activities - Over the next 180 days,  patient will: o Take medications as prescribed o Report any questions or concerns to PharmD and/or provider(s)  Initial goal documentation        [name] was given information about Chronic Care Management services today including:  1. CCM service includes personalized support from designated clinical staff supervised by his physician, including individualized plan of care and coordination with other care providers 2. 24/7 contact phone numbers for assistance for urgent and routine care needs. 3. Standard insurance, coinsurance, copays and deductibles apply for chronic care management only during months in which we provide at least 20 minutes of these services. Most insurances cover these services at 100%, however patients may be responsible for any copay, coinsurance and/or deductible if applicable. This service may help you avoid the need for more expensive face-to-face services. 4. Only one practitioner may furnish and bill the service in a calendar month. 5. The patient may stop CCM services at any time (effective at the end of the month) by phone call to the office staff.  Patient agreed to services and verbal consent obtained.   The patient verbalized understanding of instructions, educational materials, and care plan provided today and agreed to receive a mailed copy of patient instructions, educational materials, and care plan.  Telephone follow up appointment with pharmacy team member scheduled for: 6 months  Viona Gilmore, [facility]  Cholesterol Content in Foods Cholesterol is a waxy, fat-like substance that helps to carry fat in the blood. The body needs cholesterol in small amounts, but too much cholesterol can cause damage to the arteries and heart. Most people should eat less than 200 milligrams (mg) of cholesterol a day. Foods with cholesterol  Cholesterol is found in animal-based foods, such as meat, seafood, and dairy. Generally, low-fat dairy and lean meats have less  cholesterol than full-fat dairy and fatty meats. The milligrams of cholesterol per serving (mg per serving) of common cholesterol-containing foods are listed below. Meat and other proteins  Egg -- one large whole egg has 186 mg.  Veal shank -- 4 oz has 141 mg.  Lean ground [place] (93% lean) -- 4 oz has 118 mg.  Fat-trimmed lamb loin -- 4 oz has 106 mg.  Lean ground beef (90% lean) -- 4 oz has 100 mg.  Lobster -- 3.5 oz has 90 mg.  Pork loin chops -- 4 oz has 86 mg.  Canned salmon -- 3.5 oz has 83 mg.  Fat-trimmed beef top loin -- 4 oz has 78 mg.  Frankfurter -- 1 frank (3.5 oz) has 77 mg.  Crab -- 3.5 oz has 71 mg.  Roasted chicken without skin, white meat -- 4 oz has 66 mg.  Light bologna -- 2 oz has 45 mg.  Deli-cut [place] -- 2 oz has 31 mg.  Canned tuna -- 3.5 oz has 31 mg.  Berniece Salines -- 1 oz has 29 mg.  Oysters and mussels (raw) -- 3.5 oz has 25 mg.  Mackerel -- 1 oz has 22 mg.  Trout -- 1 oz has 20 mg.  Pork sausage -- 1 link (1 oz) has 17 mg.  Salmon -- 1 oz has 16 mg.  Tilapia -- 1 oz has 14 mg. Dairy  Soft-serve ice cream --  cup (4 oz) has 103 mg.  Whole-milk yogurt -- 1 cup (8 oz) has 29 mg.  Cheddar cheese -- 1 oz has 28 mg.  American cheese -- 1 oz has 28 mg.  Whole milk -- 1 cup (8 oz) has 23 mg.  2% milk -- 1 cup (8 oz) has 18 mg.  Cream cheese -- 1 tablespoon (Tbsp) has 15 mg.  Cottage cheese --  cup (4 oz) has 14 mg.  Low-fat (1%) milk -- 1 cup (8 oz) has 10 mg.  Sour cream -- 1 Tbsp has 8.5 mg.  Low-fat yogurt -- 1 cup (8 oz) has 8 mg.  Nonfat Greek yogurt -- 1 cup (8 oz) has 7 mg.  Half-and-half cream -- 1 Tbsp has 5 mg. Fats and oils  Cod liver oil -- 1 tablespoon (Tbsp) has 82 mg.  Butter -- 1 Tbsp has 15 mg.  Lard -- 1 Tbsp has 14 mg.  Bacon grease -- 1 Tbsp has 14 mg.  Mayonnaise -- 1 Tbsp has 5-10 mg.  Margarine -- 1 Tbsp has 3-10 mg. Exact amounts of cholesterol in these foods may vary depending on specific  ingredients and brands. Foods without cholesterol Most plant-based foods do not have cholesterol unless you combine them with a food that has cholesterol. Foods without cholesterol include:  Grains and cereals.  Vegetables.  Fruits.  Vegetable oils, such as olive, canola, and sunflower oil.  Legumes, such as peas, beans, and lentils.  Nuts and seeds.  Egg whites. Summary  The body needs cholesterol in small amounts, but too much cholesterol can cause damage to the arteries and heart.  Most people should eat less than 200 milligrams (mg) of cholesterol a day. This information is not intended to replace advice given to you by your health care provider. Make sure you discuss any questions you have with your health care provider. Document Revised: 09/17/2017 Document Reviewed: 06/01/2017 Elsevier Patient Education  [place].

## 2020-10-20 DIAGNOSIS — Z20822 Contact with and (suspected) exposure to covid-19: Secondary | ICD-10-CM | POA: Diagnosis not present

## 2020-12-01 ENCOUNTER — Other Ambulatory Visit: Payer: Self-pay | Admitting: Adult Health

## 2020-12-01 DIAGNOSIS — E7800 Pure hypercholesterolemia, unspecified: Secondary | ICD-10-CM

## 2020-12-01 DIAGNOSIS — N400 Enlarged prostate without lower urinary tract symptoms: Secondary | ICD-10-CM

## 2020-12-03 ENCOUNTER — Other Ambulatory Visit: Payer: Self-pay

## 2020-12-04 ENCOUNTER — Encounter: Payer: Self-pay | Admitting: Adult Health

## 2020-12-04 ENCOUNTER — Ambulatory Visit (INDEPENDENT_AMBULATORY_CARE_PROVIDER_SITE_OTHER): Payer: Medicare HMO | Admitting: Adult Health

## 2020-12-04 VITALS — BP 150/90 | Temp 98.1°F | Ht 72.75 in | Wt 180.0 lb

## 2020-12-04 DIAGNOSIS — E7800 Pure hypercholesterolemia, unspecified: Secondary | ICD-10-CM

## 2020-12-04 DIAGNOSIS — I739 Peripheral vascular disease, unspecified: Secondary | ICD-10-CM

## 2020-12-04 DIAGNOSIS — M353 Polymyalgia rheumatica: Secondary | ICD-10-CM | POA: Diagnosis not present

## 2020-12-04 DIAGNOSIS — H539 Unspecified visual disturbance: Secondary | ICD-10-CM | POA: Diagnosis not present

## 2020-12-04 DIAGNOSIS — R030 Elevated blood-pressure reading, without diagnosis of hypertension: Secondary | ICD-10-CM

## 2020-12-04 DIAGNOSIS — I70211 Atherosclerosis of native arteries of extremities with intermittent claudication, right leg: Secondary | ICD-10-CM | POA: Diagnosis not present

## 2020-12-04 DIAGNOSIS — Z0000 Encounter for general adult medical examination without abnormal findings: Secondary | ICD-10-CM

## 2020-12-04 DIAGNOSIS — N400 Enlarged prostate without lower urinary tract symptoms: Secondary | ICD-10-CM

## 2020-12-04 LAB — LIPID PANEL
Cholesterol: 161 mg/dL (ref 0–200)
HDL: 60.2 mg/dL (ref >39.00)
LDL Cholesterol: 80 mg/dL (ref 0–99)
NonHDL: 101.27
Total CHOL/HDL Ratio: 3
Triglycerides: 108 mg/dL (ref 0.0–149.0)
VLDL: 21.6 mg/dL (ref 0.0–40.0)

## 2020-12-04 LAB — COMPREHENSIVE METABOLIC PANEL
ALT: 24 U/L (ref 0–53)
AST: 28 U/L (ref 0–37)
Albumin: 4.2 g/dL (ref 3.5–5.2)
Alkaline Phosphatase: 98 U/L (ref 39–117)
BUN: 14 mg/dL (ref 6–23)
CO2: 31 mEq/L (ref 19–32)
Calcium: 10 mg/dL (ref 8.4–10.5)
Chloride: 100 mEq/L (ref 96–112)
Creatinine, Ser: 0.94 mg/dL (ref 0.40–1.50)
GFR: 79.79 mL/min (ref >60.00)
Glucose, Bld: 95 mg/dL (ref 70–99)
Potassium: 4.5 mEq/L (ref 3.5–5.1)
Sodium: 138 mEq/L (ref 135–145)
Total Bilirubin: 0.5 mg/dL (ref 0.2–1.2)
Total Protein: 7.6 g/dL (ref 6.0–8.3)

## 2020-12-04 LAB — SEDIMENTATION RATE: Sed Rate: 24 mm/hr — ABNORMAL HIGH (ref 0–20)

## 2020-12-04 LAB — CBC WITH DIFFERENTIAL/PLATELET
Basophils Absolute: 0.1 10*3/uL (ref 0.0–0.1)
Basophils Relative: 0.6 % (ref 0.0–3.0)
Eosinophils Absolute: 0.3 10*3/uL (ref 0.0–0.7)
Eosinophils Relative: 2.8 % (ref 0.0–5.0)
HCT: 44.7 % (ref 39.0–52.0)
Hemoglobin: 15 g/dL (ref 13.0–17.0)
Lymphocytes Relative: 19.4 % (ref 12.0–46.0)
Lymphs Abs: 1.8 10*3/uL (ref 0.7–4.0)
MCHC: 33.6 g/dL (ref 30.0–36.0)
MCV: 89 fl (ref 78.0–100.0)
Monocytes Absolute: 0.8 10*3/uL (ref 0.1–1.0)
Monocytes Relative: 8 % (ref 3.0–12.0)
Neutro Abs: 6.5 10*3/uL (ref 1.4–7.7)
Neutrophils Relative %: 69.2 % (ref 43.0–77.0)
Platelets: 225 10*3/uL (ref 150.0–400.0)
RBC: 5.03 Mil/uL (ref 4.22–5.81)
RDW: 13.2 % (ref 11.5–15.5)
WBC: 9.4 10*3/uL (ref 4.0–10.5)

## 2020-12-04 LAB — C-REACTIVE PROTEIN: CRP: <1 mg/dL (ref 0.5–20.0)

## 2020-12-04 LAB — PSA: PSA: 0.87 ng/mL (ref 0.10–4.00)

## 2020-12-04 LAB — TSH: TSH: 1.47 u[IU]/mL (ref 0.35–4.50)

## 2020-12-04 MED ORDER — EPINEPHRINE 0.3 MG/0.3ML IJ SOAJ: 0.3000 mg | Freq: Once | INTRAMUSCULAR | 1 refills | Status: AC

## 2020-12-04 NOTE — Patient Instructions (Signed)
It was great seeing you today   Please follow up with vascular surgery   We will follow up with you regarding your blood work   I will see you back in 1 year or sooner if needed

## 2020-12-04 NOTE — Telephone Encounter (Signed)
Appt on 12/04/20.  Will hold

## 2020-12-04 NOTE — Progress Notes (Signed)
Subjective:    Patient ID: Brandon Dixon, male    DOB: 1946/07/17, 75 y.o.   MRN: [id number]  HPI Patient presents for yearly preventative medicine examination. He is a pleasant 75 year old male who  has a past medical history of ALLERGIC RHINITIS (09/26/2007), BENIGN PROSTATIC HYPERTROPHY (05/27/2007), COLONIC POLYPS, HX OF (05/27/2007), DERMATITIS (08/25/2010), GERD (05/27/2007), HYPERCHOLESTEROLEMIA (09/26/2007), and Peripheral vascular disease ([place]).  Hyperlipidemia-is currently prescribed Lipitor 80 mg and aspirin 81 mg daily.  He denies myalgia or fatigue Lab Results  Component Value Date   CHOL 161 12/04/2020   HDL 60.20 12/04/2020   LDLCALC 80 12/04/2020   LDLDIRECT 193.1 09/22/2006   TRIG 108.0 12/04/2020   CHOLHDL 3 12/04/2020   BPH-uses Flomax 0.4 mg daily.  He denies any symptoms currently  PVD-is followed by vascular surgery on a yearly basis due to peripheral vascular disease in the left leg.  He denies any complaints today  Polymyalgia rheumatica-is currently prescribed prednisone 2.5  mg daily.  He feels well controlled on this.  Denies joint pain, muscle stiffness or fatigue  Lab Results  Component Value Date   CRP <1.0 12/04/2020   Elevated Blood pressure reading -today in the office his blood pressure is 150/80.  Past his blood pressure has been fairly normal.  Does not check at home.  Denies symptoms of hypertension  BP Readings from Last 3 Encounters:  12/04/20 (!) 150/90  06/26/20 118/76  06/06/20 124/82   Cloudy Vision -he reports that for an unknown amount of time he will have intermittent episodes of what he describes as "it looks like the house is filled with smoke", his visual disturbance lasts few minutes at the most and then resolve.  Last happened about a week ago.  Denies any other visual disturbances.  Does report that he was seen by his eye doctor but did not do any testing and they advised him to follow-up with his PCP  All immunizations and health  maintenance protocols were reviewed with the patient and needed orders were placed.  Appropriate screening laboratory values were ordered for the patient including screening of hyperlipidemia, renal function and hepatic function. If indicated by BPH, a PSA was ordered.  Medication reconciliation,  past medical history, social history, problem list and allergies were reviewed in detail with the patient  Goals were established with regard to weight loss, exercise, and  diet in compliance with medications  He is up-to-date on routine colon cancer screening  Review of Systems  Constitutional: Negative.   HENT: Negative.   Eyes: Positive for visual disturbance.  Respiratory: Negative.   Cardiovascular: Negative.   Gastrointestinal: Negative.   Endocrine: Negative.   Genitourinary: Negative.   Musculoskeletal: Positive for arthralgias.  Skin: Negative.   Allergic/Immunologic: Negative.   Neurological: Negative.   Hematological: Negative.   Psychiatric/Behavioral: Negative.   All other systems reviewed and are negative.  Past Medical History:  Diagnosis Date  . ALLERGIC RHINITIS 09/26/2007  . BENIGN PROSTATIC HYPERTROPHY 05/27/2007  . COLONIC POLYPS, HX OF 05/27/2007  . DERMATITIS 08/25/2010  . GERD 05/27/2007  . HYPERCHOLESTEROLEMIA 09/26/2007  . Peripheral vascular disease ([place])     Social History   Socioeconomic History  . Marital status: Married    Spouse name: Not on file  . Number of children: Not on file  . Years of education: Not on file  . Highest education level: Not on file  Occupational History  . Not on file  Tobacco Use  . Smoking  status: Former Smoker    Quit date: 10/19/2001    Years since quitting: 19.1  . Smokeless tobacco: Never Used  Substance and Sexual Activity  . Alcohol use: Yes    Alcohol/week: 6.0 standard drinks    Types: 6 Cans of beer per week  . Drug use: No  . Sexual activity: Not on file  Other Topics Concern  . Not on file  Social History  Narrative   Works as a Physicist, medical    Married    Three daughters    Social Determinants of Radio broadcast assistant Strain: Low Risk   . Difficulty of Paying Living Expenses: Not hard at all  Food Insecurity: No Food Insecurity  . Worried About Charity fundraiser in the Last Year: Never true  . Ran Out of Food in the Last Year: Never true  Transportation Needs: No Transportation Needs  . Lack of Transportation (Medical): No  . Lack of Transportation (Non-Medical): No  Physical Activity: Inactive  . Days of Exercise per Week: 0 days  . Minutes of Exercise per Session: 0 min  Stress: No Stress Concern Present  . Feeling of Stress : Not at all  Social Connections: Moderately Integrated  . Frequency of Communication with Friends and Family: More than three times a week  . Frequency of Social Gatherings with Friends and Family: Not on file  . Attends Religious Services: More than 4 times per year  . Active Member of Clubs or Organizations: No  . Attends Archivist Meetings: Never  . Marital Status: Married  Human resources officer Violence: Not At Risk  . Fear of Current or Ex-Partner: No  . Emotionally Abused: No  . Physically Abused: No  . Sexually Abused: No    Past Surgical History:  Procedure Laterality Date  . LOWER EXTREMITY ANGIOGRAM Left 04/30/2014   Procedure: LOWER EXTREMITY ANGIOGRAM;  Surgeon: Angelia Mould, MD;  Location: [facility] CATH LAB;  Service: Cardiovascular;  Laterality: Left;  . ROTATOR CUFF REPAIR    . VASECTOMY      Family History  Problem Relation Age of Onset  . Heart disease Mother   . Heart attack Mother        2   . Cancer Father        lung ca  . Hypertension Sister   . Diabetes Brother   . Hypertension Sister   . Diabetes Brother   . Diabetes Brother   . Diabetes Brother   . Diabetes Brother   . Diabetes Daughter     No Known Allergies  Current Outpatient Medications on File Prior to Visit  Medication Sig Dispense  Refill  . aspirin EC 81 MG tablet Take 81 mg by mouth daily.    Marland Kitchen atorvastatin (LIPITOR) 80 MG tablet Take 1 tablet (80 mg total) by mouth daily. 90 tablet 3  . fluticasone (FLONASE) 50 MCG/ACT nasal spray PLACE 2 SPRAYS INTO BOTH NOSTRILS DAILY. 48 g 4  . hydrocortisone cream 1 % Apply 1 application topically 2 (two) times daily as needed for itching. For legs    . predniSONE (DELTASONE) 2.5 MG tablet Take 2 tablets every morning (Patient taking differently: Take 1 tablets every morning) 135 tablet 3  . tamsulosin (FLOMAX) 0.4 MG CAPS capsule TAKE 1 CAPSULE  DAILY AFTER SUPPER. 90 capsule 3  . triamcinolone cream (KENALOG) 0.1 % Apply 1 application topically 2 (two) times daily. 180 g 1   No current facility-administered medications on file  prior to visit.    BP (!) 150/90   Temp 98.1 F (36.7 C)   Ht 6' 0.75" (1.848 m) Comment: WITH SHOES  Wt 180 lb (81.6 kg)   BMI 23.91 kg/m       Objective:   Physical Exam Vitals and nursing note reviewed.  Constitutional:      General: He is not in acute distress.    Appearance: Normal appearance. He is well-developed and normal weight.  HENT:     Head: Normocephalic and atraumatic.     Right Ear: Tympanic membrane, ear canal and external ear normal. There is no impacted cerumen.     Left Ear: Tympanic membrane, ear canal and external ear normal. There is no impacted cerumen.     Nose: Nose normal. No congestion or rhinorrhea.     Mouth/Throat:     Mouth: Mucous membranes are moist.     Pharynx: Oropharynx is clear. No oropharyngeal exudate or posterior oropharyngeal erythema.  Eyes:     General:        Right eye: No discharge.        Left eye: No discharge.     Extraocular Movements: Extraocular movements intact.     Conjunctiva/sclera: Conjunctivae normal.     Pupils: Pupils are equal, round, and reactive to light.  Neck:     Vascular: No carotid bruit.     Trachea: No tracheal deviation.  Cardiovascular:     Rate and Rhythm:  Normal rate and regular rhythm.     Pulses: Normal pulses.     Heart sounds: Normal heart sounds. No murmur heard. No friction rub. No gallop.   Pulmonary:     Effort: Pulmonary effort is normal. No respiratory distress.     Breath sounds: Normal breath sounds. No stridor. No wheezing, rhonchi or rales.  Chest:     Chest wall: No tenderness.  Abdominal:     General: Abdomen is flat. Bowel sounds are normal. There is no distension.     Palpations: Abdomen is soft. There is no mass.     Tenderness: There is no abdominal tenderness. There is no right CVA tenderness, left CVA tenderness, guarding or rebound.     Hernia: No hernia is present.  Musculoskeletal:        General: No swelling, tenderness, deformity or signs of injury. Normal range of motion.     Right lower leg: No edema.     Left lower leg: No edema.  Lymphadenopathy:     Cervical: No cervical adenopathy.  Skin:    General: Skin is warm and dry.     Capillary Refill: Capillary refill takes less than 2 seconds.     Coloration: Skin is not jaundiced or pale.     Findings: No bruising, erythema, lesion or rash.  Neurological:     General: No focal deficit present.     Mental Status: He is alert and oriented to person, place, and time.     Cranial Nerves: No cranial nerve deficit.     Sensory: No sensory deficit.     Motor: No weakness.     Coordination: Coordination normal.     Gait: Gait normal.     Deep Tendon Reflexes: Reflexes normal.  Psychiatric:        Mood and Affect: Mood normal.        Behavior: Behavior normal.        Thought Content: Thought content normal.        Judgment: Judgment  normal.       Assessment & Plan:  1. Routine general medical examination at a health care facility - Follow up in one year or sooner if needed - Continue to stay active and eat healthy  - CBC with Differential/Platelet; Future - Comprehensive metabolic panel; Future - Lipid panel; Future - TSH; Future - EPINEPHrine 0.3  mg/0.3 mL IJ SOAJ injection; Inject 0.3 mg into the muscle once for 1 dose.  Dispense: 1 each; Refill: 1 - TSH - Lipid panel - Comprehensive metabolic panel - CBC with Differential/Platelet  2. Polymyalgia rheumatica (HCC) - Consider dose change of prednisone  - CBC with Differential/Platelet; Future - Comprehensive metabolic panel; Future - Lipid panel; Future - TSH; Future - Sedimentation Rate; Future - C-reactive Protein; Future - C-reactive Protein - Sedimentation Rate - TSH - Lipid panel - Comprehensive metabolic panel - CBC with Differential/Platelet  3. Atherosclerosis of native artery of right lower extremity with intermittent claudication ([place]) - Consider increase in statin  - CBC with Differential/Platelet; Future - Comprehensive metabolic panel; Future - Lipid panel; Future - TSH; Future - TSH - Lipid panel - Comprehensive metabolic panel - CBC with Differential/Platelet  4. HYPERCHOLESTEROLEMIA - Consider increase in statin  - CBC with Differential/Platelet; Future - Comprehensive metabolic panel; Future - Lipid panel; Future - TSH; Future - TSH - Lipid panel - Comprehensive metabolic panel - CBC with Differential/Platelet  5. Benign prostatic hyperplasia without lower urinary tract symptoms  - PSA; Future - PSA  6. PVD (peripheral vascular disease) ([place]) - Follow up with Vascular as directed - CBC with Differential/Platelet; Future - Comprehensive metabolic panel; Future - Lipid panel; Future - TSH; Future - TSH - Lipid panel - Comprehensive metabolic panel - CBC with Differential/Platelet  7. Elevated blood pressure reading -We will have him monitor his blood pressure at home over the next week and he will send me his results via MyChart.  Consider starting on low-dose calcium channel blocker  8. Vision disturbance -Discussed possible etiologies with the patient.  Since his symptoms are intermittent, I think an MRI of the head would have  low yield.  We will have him follow-up with his eye doctor for further testing.  Dorothyann Peng, NP

## 2020-12-05 NOTE — Telephone Encounter (Signed)
Sent to the pharmacy by e-scribe. 

## 2020-12-13 ENCOUNTER — Telehealth: Payer: Self-pay | Admitting: Adult Health

## 2020-12-13 MED ORDER — OLMESARTAN MEDOXOMIL 5 MG PO TABS: 5.0000 mg | ORAL_TABLET | Freq: Every day | ORAL | 0 refills | Status: DC

## 2020-12-13 NOTE — Telephone Encounter (Signed)
Pt is calling in with blood pressure readings and pt is stating that he has had a pulse rate as low as 63 (175/91) 12/12/2020 11:17pm and the high BP reading is 178/89 Hr 66 12/12/2020  11:23 pm.  Pt would like to have a call back to let him know the plan that Tommi Rumps would like to take b/c he stated that he has never had high blood pressure readings before.  Pt stated that he will add more to his mychart or when he gets a call back from the office.  2/21      2:28 p,m  138/78   Hr 71  7:48 pm  157/78    Hr 79 2/22   12:38 am   139/88  Hr 79     5:07 a.m  140/76   Hr  76    12:30 p.m. 154/73  Hr 77     12:35pm  140/75   Hr 77 2/23  7:38 pm  154/83  Hr 82      12:08 am  154/84  Hr 74    12:13 pm 140/78  Hr 76

## 2020-12-13 NOTE — Telephone Encounter (Signed)
Spoke to patient after he sent me his blood pressure readings, they have been trending slightly higher than we would like at home.  He is okay with going on blood pressure medication.  We will start him on Benicar 5 mg.  We reviewed side effects of this medication.  He will send me his blood pressure readings in 2 weeks

## 2020-12-17 ENCOUNTER — Encounter: Payer: Self-pay | Admitting: Adult Health

## 2021-01-02 ENCOUNTER — Telehealth: Payer: Self-pay | Admitting: Pharmacist

## 2021-01-02 NOTE — Chronic Care Management (AMB) (Addendum)
Chronic Care Management Pharmacy Assistant   Name: Brandon Dixon  MRN: [id number] DOB: 1945/11/14   Reason for Encounter: Hyperlipidemia Adherence Call    Recent office visits:  12/04/20-Nafziger, Tommi Rumps, NP ([place]).  Recent consult visits:  None.  Hospital visits:  None in previous 6 months   Medication changes indicated: None.  Medications: Outpatient Encounter Medications as of 01/02/2021  Medication Sig   aspirin EC 81 MG tablet Take 81 mg by mouth daily.   atorvastatin (LIPITOR) 80 MG tablet TAKE 1 TABLET (80 MG TOTAL) BY MOUTH DAILY.   fluticasone (FLONASE) 50 MCG/ACT nasal spray PLACE 2 SPRAYS INTO BOTH NOSTRILS DAILY.   hydrocortisone cream 1 % Apply 1 application topically 2 (two) times daily as needed for itching. For legs   olmesartan (BENICAR) 5 MG tablet Take 1 tablet (5 mg total) by mouth daily.   predniSONE (DELTASONE) 2.5 MG tablet Take 2 tablets every morning (Patient taking differently: Take 1 tablets every morning)   tamsulosin (FLOMAX) 0.4 MG CAPS capsule TAKE 1 CAPSULE  DAILY AFTER SUPPER.   triamcinolone cream (KENALOG) 0.1 % Apply 1 application topically 2 (two) times daily.   No facility-administered encounter medications on file as of 01/02/2021.    01/02/2021 Name: MYRICK MCNAIRY MRN: [id number] DOB: [birth year] MARTAVIOUS HARTEL is a 75 y.o. year old male who is a primary care patient of Dorothyann Peng, NP.  Comprehensive medication review performed; Spoke to patient regarding cholesterol  Lipid Panel    Component Value Date/Time   CHOL 161 12/04/2020 1039   TRIG 108.0 12/04/2020 1039   TRIG 87 09/22/2006 1054   HDL 60.20 12/04/2020 1039   LDLCALC 80 12/04/2020 1039   LDLDIRECT 193.1 09/22/2006 1054    10-year ASCVD risk score: The 10-year ASCVD risk score Mikey Bussing DC Brooke Bonito., et al., 2013) is: 15.6%   Values used to calculate the score:     Age: 75 years     Sex: Male     Is Non-Hispanic African American: Yes     Diabetic: No     Tobacco smoker:  No     Systolic Blood Pressure: 177 mmHg     Is BP treated: No     HDL Cholesterol: 60.2 mg/dL     Total Cholesterol: 161 mg/dL  Current antihyperlipidemic regimen:  Atorvastatin 80 mg 1 tablet daily  Previous antihyperlipidemic medications tried: None.  ASCVD risk enhancing conditions: age >8   What recent interventions/DTPs have been made by any provider to improve Cholesterol control since last CPP Visit:  Patient reports he is taking his medication as directed.  Any recent hospitalizations or ED visits since last visit with CPP? No   What diet changes have been made to improve Cholesterol?  Patient reports he drinks 2 to 3 cups of coffee in the morning, eats cereal (cheerios), cheese toast, and oatmeal. For lunch, ham lettuce and tomato sandwich, soups (vegetable soups in the winter), cut back from eating fried foods. Drinking between 16 to 24 ounces of water daily.   What exercise is being done to improve Cholesterol?  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  Adherence Review: Does the patient have >5 day gap between last estimated fill dates? No    Star Rating Drugs: Atorvastatin 80 MG tablet: #90 DS, last filled on 12/05/20. Olmesartan 5 MG tablet:#90 DS, last filled on 12/13/20 from [facility].  Jeni Salles, CPP Notified.  Raynelle Highland, [place] Pharmacist Assistant [phone] CCM Total Time:22 Minutes

## 2021-01-03 ENCOUNTER — Telehealth: Payer: Self-pay

## 2021-01-03 NOTE — Telephone Encounter (Signed)
Patient is s/p angio in 2015 by CSD - last seen in 2019. He was scheduled for a yearly follow up with ABIs, but did not keep appt. He calls today with some numbness and cramping in bilateral legs mostly while walking. Says he can walk through it, then legs become numb and tingly. Denies rest pain. Spoke with MN - decided patient did not require new referral. Placed patient on CSD schedule for vascular follow up and ABIs. Advised patient to keep yearly appts so we could monitor PAD progression.

## 2021-02-05 ENCOUNTER — Encounter (HOSPITAL_COMMUNITY): Payer: Medicare HMO

## 2021-02-05 ENCOUNTER — Ambulatory Visit: Payer: Medicare HMO | Admitting: Vascular Surgery

## 2021-02-10 ENCOUNTER — Other Ambulatory Visit: Payer: Self-pay

## 2021-02-10 DIAGNOSIS — I70211 Atherosclerosis of native arteries of extremities with intermittent claudication, right leg: Secondary | ICD-10-CM

## 2021-02-19 ENCOUNTER — Ambulatory Visit: Payer: Medicare HMO | Admitting: Vascular Surgery

## 2021-02-19 ENCOUNTER — Other Ambulatory Visit: Payer: Self-pay

## 2021-02-19 ENCOUNTER — Ambulatory Visit (HOSPITAL_COMMUNITY)
Admission: RE | Admit: 2021-02-19 | Discharge: 2021-02-19 | Disposition: A | Discharge status: Home / Self Care | Payer: Medicare HMO | Source: Ambulatory Visit | Attending: Vascular Surgery | Admitting: Vascular Surgery

## 2021-02-19 ENCOUNTER — Encounter: Payer: Self-pay | Admitting: Vascular Surgery

## 2021-02-19 VITALS — BP 114/73 | HR 78 | Temp 98.2°F | Resp 20 | Ht 72.75 in | Wt 175.0 lb

## 2021-02-19 DIAGNOSIS — I70211 Atherosclerosis of native arteries of extremities with intermittent claudication, right leg: Secondary | ICD-10-CM | POA: Insufficient documentation

## 2021-02-19 NOTE — Progress Notes (Signed)
REASON FOR VISIT:   Follow-up of peripheral vascular disease.  MEDICAL ISSUES:   PERIPHERAL VASCULAR DISEASE: This patient has known severe infrainguinal arterial occlusive disease.  He has stable claudication of the left lower extremity.  Based on his previous arteriogram way back in 2015 he would require a tibial bypass.  Therefore we have been managing this conservatively.  He is very motivated and walks daily.  He can walk about an 8 of a mile before experiencing symptoms.  He denies any history of rest pain or nonhealing wounds.  For this reason we are agreeing that we should follow this conservatively for now.  I have ordered follow-up ABIs in 1 year and also left great saphenous vein map at that time.  If his symptoms progress significantly that then certainly we could consider arteriography and reconsider a tibial bypass.  However currently his symptoms are stable.  He is on aspirin and is on a statin.  He is not a smoker.   HPI:   Brandon Dixon is a pleasant 75 y.o. male who I last saw approximately 3 years ago with peripheral vascular disease.  At that time he had left lower extremity claudication.  When I saw him in 2015, we discussed conservative treatment however he wished to pursue arteriography.  His arteriogram back in 2015 showed that he had a superficial femoral artery occlusion on the left with reconstitution of the above-knee popliteal artery.  He then had occlusion of the popliteal artery below the knee and then reconstituted his anterior tibial artery.  I would did not think he was a candidate for endovascular approach and felt that if his symptoms progressed he would require likely a tibial bypass.  Since I saw him last, he continues to have stable left calf claudication.  He can walk about an 8 mile before experiencing symptoms.  His symptoms are brought on by ambulation and relieved with rest.  He has pain in his left calf only.  He denies any hip or thigh claudication.   Has had no symptoms on the right side.  He is not a smoker.  He is on aspirin and is on a statin.  He denies any history of rest pain or nonhealing ulcers.  He has no significant cardiac history.  Past Medical History:  Diagnosis Date  . ALLERGIC RHINITIS 09/26/2007  . BENIGN PROSTATIC HYPERTROPHY 05/27/2007  . COLONIC POLYPS, HX OF 05/27/2007  . DERMATITIS 08/25/2010  . GERD 05/27/2007  . HYPERCHOLESTEROLEMIA 09/26/2007  . Peripheral vascular disease ([place])     Family History  Problem Relation Age of Onset  . Heart disease Mother   . Heart attack Mother        13   . Cancer Father        lung ca  . Hypertension Sister   . Diabetes Brother   . Hypertension Sister   . Diabetes Brother   . Diabetes Brother   . Diabetes Brother   . Diabetes Brother   . Diabetes Daughter     SOCIAL HISTORY: Social History   Tobacco Use  . Smoking status: Former Smoker    Quit date: 10/19/2001    Years since quitting: 19.3  . Smokeless tobacco: Never Used  Substance Use Topics  . Alcohol use: Yes    Alcohol/week: 6.0 standard drinks    Types: 6 Cans of beer per week    No Known Allergies  Current Outpatient Medications  Medication Sig Dispense Refill  . aspirin  EC 81 MG tablet Take 81 mg by mouth daily.    Marland Kitchen atorvastatin (LIPITOR) 80 MG tablet TAKE 1 TABLET (80 MG TOTAL) BY MOUTH DAILY. 90 tablet 3  . fluticasone (FLONASE) 50 MCG/ACT nasal spray PLACE 2 SPRAYS INTO BOTH NOSTRILS DAILY. 48 g 4  . hydrocortisone cream 1 % Apply 1 application topically 2 (two) times daily as needed for itching. For legs    . olmesartan (BENICAR) 5 MG tablet Take 1 tablet (5 mg total) by mouth daily. 90 tablet 0  . predniSONE (DELTASONE) 2.5 MG tablet Take 2 tablets every morning (Patient taking differently: Take 1 tablets every morning) 135 tablet 3  . tamsulosin (FLOMAX) 0.4 MG CAPS capsule TAKE 1 CAPSULE  DAILY AFTER SUPPER. 90 capsule 3  . triamcinolone cream (KENALOG) 0.1 % Apply 1 application topically 2  (two) times daily. 180 g 1   No current facility-administered medications for this visit.    REVIEW OF SYSTEMS:  [X]  denotes positive finding, [ ]  denotes negative finding Cardiac  Comments:  Chest pain or chest pressure:    Shortness of breath upon exertion:    Short of breath when lying flat:    Irregular heart rhythm:        Vascular    Pain in calf, thigh, or hip brought on by ambulation:    Pain in feet at night that wakes you up from your sleep:     Blood clot in your veins:    Leg swelling:         Pulmonary    Oxygen at home:    Productive cough:     Wheezing:         Neurologic    Sudden weakness in arms or legs:     Sudden numbness in arms or legs:     Sudden onset of difficulty speaking or slurred speech:    Temporary loss of vision in one eye:     Problems with dizziness:         Gastrointestinal    Blood in stool:     Vomited blood:         Genitourinary    Burning when urinating:     Blood in urine:        Psychiatric    Major depression:         Hematologic    Bleeding problems:    Problems with blood clotting too easily:        Skin    Rashes or ulcers:        Constitutional    Fever or chills:     PHYSICAL EXAM:   Vitals:   02/19/21 1537  BP: 114/73  Pulse: 78  Resp: 20  Temp: 98.2 F (36.8 C)  SpO2: 95%  Weight: 175 lb (79.4 kg)  Height: 6' 0.75" (1.848 m)    GENERAL: The patient is a well-nourished male, in no acute distress. The vital signs are documented above. CARDIAC: There is a regular rate and rhythm.  VASCULAR: I do not detect carotid bruits. On the right side he has a palpable femoral, popliteal, and dorsalis pedis pulse. On the left side, he has a normal femoral pulse.  I cannot palpate a popliteal or pedal pulses. He has no significant lower extremity swelling. PULMONARY: There is good air exchange bilaterally without wheezing or rales. ABDOMEN: Soft and non-tender with normal pitched bowel sounds.  I do not palpate  an aneurysm. MUSCULOSKELETAL: There are no major deformities or  cyanosis. NEUROLOGIC: No focal weakness or paresthesias are detected. SKIN: There are no ulcers or rashes noted. PSYCHIATRIC: The patient has a normal affect.  DATA:    ARTERIAL DOPPLER STUDY: I have independently interpreted his arterial Doppler study today.  On the right side there is a triphasic dorsalis pedis and posterior tibial signal.  ABIs 94%.  Toe pressures 88 mmHg.  The ABI on the right is down slightly from 100% 3 years ago.  On the left side there is a monophasic dorsalis pedis and posterior tibial signal.  ABIs 47%.  Toe pressures 48 mmHg.  The ABI on the left is down slightly from 62% 3 years ago.  Deitra Mayo [facility] [phone]

## 2021-03-20 ENCOUNTER — Telehealth: Payer: Self-pay | Admitting: Pharmacist

## 2021-03-20 ENCOUNTER — Other Ambulatory Visit: Payer: Self-pay | Admitting: Adult Health

## 2021-03-20 NOTE — Chronic Care Management (AMB) (Signed)
Date- Patient called to remind of appointment with Watt Climes on 06.03.2022 at  10:00 am   No answer, left message of appointment date, time and type of appointment (either telephone or in person). Left message to have all medications, supplements, blood pressure and/or blood sugar logs available during appointment and to return call if need to reschedule.   Star Rating Drug: Medication Dispensed Quantity Pharmacy  Olmesartan 5 mg 02.17.2022 90 [facility]  Atorvastatin 80 mg 02.17.2022 90 [facility]    Any gaps in medications fill history? No   Maia Breslow, [place] Pharmacist Assistant [phone]

## 2021-03-21 ENCOUNTER — Other Ambulatory Visit: Payer: Self-pay | Admitting: Adult Health

## 2021-03-21 ENCOUNTER — Ambulatory Visit (INDEPENDENT_AMBULATORY_CARE_PROVIDER_SITE_OTHER): Payer: Medicare HMO | Admitting: Pharmacist

## 2021-03-21 DIAGNOSIS — E7800 Pure hypercholesterolemia, unspecified: Secondary | ICD-10-CM | POA: Diagnosis not present

## 2021-03-21 DIAGNOSIS — R030 Elevated blood-pressure reading, without diagnosis of hypertension: Secondary | ICD-10-CM

## 2021-03-21 DIAGNOSIS — Z7952 Long term (current) use of systemic steroids: Secondary | ICD-10-CM

## 2021-03-21 DIAGNOSIS — N400 Enlarged prostate without lower urinary tract symptoms: Secondary | ICD-10-CM | POA: Diagnosis not present

## 2021-03-21 NOTE — Patient Instructions (Signed)
Hi Tracker,  It was great to get to speak with you again! Below is a summary of some of the topics we discussed.   Please reach out to me if you have any questions or need anything before our follow up!  Best, Maddie  Jeni Salles, PharmD, [place] [facility] [place]  Visit Information  Goals Addressed   None    Patient Care Plan: CCM Pharmacy Care Plan    Problem Identified: Problem: Hypertension, Hyperlipidemia, BPH, Allergic Rhinitis and Polymyalgia rheumatica     Long-Range Goal: Patient-Specific Goal   Start Date: 03/21/2021  Expected End Date: 03/21/2022  This Visit's Progress: On track  Priority: High  Note:   Current Barriers:  . Unable to independently monitor therapeutic efficacy  Pharmacist Clinical Goal(s):  Marland Kitchen Patient will achieve adherence to monitoring guidelines and medication adherence to achieve therapeutic efficacy through collaboration with PharmD and provider.   Interventions: . 1:1 collaboration with Dorothyann Peng, NP regarding development and update of comprehensive plan of care as evidenced by provider attestation and co-signature . Inter-disciplinary care team collaboration (see longitudinal plan of care) . Comprehensive medication review performed; medication list updated in electronic medical record  Hypertension (BP goal <140/90) -Controlled -Current treatment: . 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 Aspirin 81 mg 1 tablet daily -Medications previously tried: none  -Recommended to continue current medication  BPH (Goal: minimize symptoms of enlarged prostate) -Controlled -Current treatment  . Tamsulosin 0.4 mg 1 capsule daily after supper -Medications previously tried: none  -Recommended to continue current medication Counseled on if missing doses, can still take a few hours later  Polymyalgia rheumatica (Goal: minimize symptoms) -Controlled -Current treatment  . No medications -Medications previously tried: prednisone  -Recommended to continue as is  Allergic rhinitis (Goal: minimize symptoms) -Controlled -Current treatment   Flonase 50 mcg/act nasal spray 2 sprays in both nostrils daily as needed  Epipen as needed -Medications previously tried: none  -Recommended to continue current medication   Health Maintenance -Vaccine gaps: none -Current therapy:  . Calcium and vit D 600 mg - 400 units 1 tablet daily  -Educated on Cost vs benefit of each product must be carefully weighed by individual consumer -Patient is satisfied with current therapy and denies issues -Recommended to continue current medication  Patient Goals/Self-Care Activities . Patient will:  - take medications as prescribed check blood  pressure weekly, document, and provide at future appointments target a minimum of 150 minutes of moderate intensity exercise weekly  Follow  Up Plan: Telephone follow up appointment with care management team member scheduled for: 6 months       Patient verbalizes understanding of instructions provided today and agrees to view in [place].  Telephone follow up appointment with pharmacy team member scheduled for: 6 months  Viona Gilmore, [facility]

## 2021-03-21 NOTE — Progress Notes (Signed)
Chronic Care Management Pharmacy Note  03/21/2021 Name:  Brandon Dixon MRN:  [id number] DOB:  07-24-1946  Summary: BP at goal < 140/90 per home and recent office readings LDL at goal < 100  Recommendations/Changes made from today's visit: -Removed prednisone from medication list (stopped after PCP visit) -Recommend DEXA due to long term steroid use -Recommended moving Benicar to before bedtime for ASCVD benefits  Plan: -Follow up BP assessment in 6 months  Subjective: Brandon Dixon is an 75 y.o. year old male who is a primary patient of Dorothyann Peng, NP.  The CCM team was consulted for assistance with disease management and care coordination needs.    Engaged with patient by telephone for follow up visit in response to provider referral for pharmacy case management and/or care coordination services.   Consent to Services:  The patient was given information about Chronic Care Management services, agreed to services, and gave verbal consent prior to initiation of services.  Please see initial visit note for detailed documentation.   Patient Care Team: Dorothyann Peng, NP as PCP - General (Family Medicine) Juanita Craver, MD as Consulting Physician (Gastroenterology) Viona Gilmore, [facility] as Pharmacist (Pharmacist)  Recent office visits: 12/13/20 Telephone encounter: Prescribed Benicar 5 mg daily for elevated BP readings.  12/04/20 Dorothyann Peng, NP: Patient presented for his annual exam. BP was elevated in office.  Recent consult visits: 02/19/21 Deitra Mayo, MD (vasc surgery): Patient presented for initial visit.  Hospital visits: None in previous 6 months   Objective:  Lab Results  Component Value Date   CREATININE 0.94 12/04/2020   BUN 14 12/04/2020   GFR 79.79 12/04/2020   GFRNONAA 100.39 09/26/2010   GFRAA 110 09/25/2008   NA 138 12/04/2020   K 4.5 12/04/2020   CALCIUM 10.0 12/04/2020   CO2 31 12/04/2020   GLUCOSE 95 12/04/2020    Lab Results   Component Value Date/Time   GFR 79.79 12/04/2020 10:39 AM   GFR 96.20 12/01/2019 10:30 AM    Last diabetic Eye exam: No results found for: HMDIABEYEEXA  Last diabetic Foot exam: No results found for: HMDIABFOOTEX   Lab Results  Component Value Date   CHOL 161 12/04/2020   HDL 60.20 12/04/2020   LDLCALC 80 12/04/2020   LDLDIRECT 193.1 09/22/2006   TRIG 108.0 12/04/2020   CHOLHDL 3 12/04/2020    Hepatic Function Latest Ref Rng & Units 12/04/2020 12/01/2019 06/23/2019  Total Protein 6.0 - 8.3 g/dL 7.6 7.7 7.4  Albumin 3.5 - 5.2 g/dL 4.2 4.3 4.1  AST 0 - 37 U/L '28 26 22  ' ALT 0 - 53 U/L '24 22 15  ' Alk Phosphatase 39 - 117 U/L 98 123(H) 107  Total Bilirubin 0.2 - 1.2 mg/dL 0.5 0.7 0.4  Bilirubin, Direct 0.0 - 0.3 mg/dL - - -    Lab Results  Component Value Date/Time   TSH 1.47 12/04/2020 10:39 AM   TSH 1.01 12/01/2019 10:30 AM    CBC Latest Ref Rng & Units 12/04/2020 12/01/2019 06/23/2019  WBC 4.0 - 10.5 K/uL 9.4 9.5 8.8  Hemoglobin 13.0 - 17.0 g/dL 15.0 15.7 14.1  Hematocrit 39.0 - 52.0 % 44.7 47.6 42.6  Platelets 150.0 - 400.0 K/uL 225.0 209.0 225.0    No results found for: VD25OH  Clinical ASCVD: No  The 10-year ASCVD risk score Mikey Bussing DC Jr., et al., 2013) is: 9.8%   Values used to calculate the score:     Age: 34 years     Sex:  Male     Is Non-Hispanic African American: Yes     Diabetic: No     Tobacco smoker: No     Systolic Blood Pressure: 165 mmHg     Is BP treated: No     HDL Cholesterol: 60.2 mg/dL     Total Cholesterol: 161 mg/dL    Depression screen [facility] 2/9 12/04/2020 06/26/2020 12/01/2019  Decreased Interest 0 0 0  Down, Depressed, Hopeless 0 0 0  PHQ - 2 Score 0 0 0  Altered sleeping - 0 -  Tired, decreased energy - 0 -  Change in appetite - 0 -  Feeling bad or failure about yourself  - 0 -  Trouble concentrating - 0 -  Moving slowly or fidgety/restless - 0 -  Suicidal thoughts - 0 -  PHQ-9 Score - 0 -  Difficult doing work/chores - Not difficult at all  -      Social History   Tobacco Use  Smoking Status Former Smoker  . Quit date: 10/19/2001  . Years since quitting: 19.4  Smokeless Tobacco Never Used   BP Readings from Last 3 Encounters:  02/19/21 114/73  12/04/20 (!) 150/90  06/26/20 118/76   Pulse Readings from Last 3 Encounters:  02/19/21 78  06/26/20 75  06/06/20 73   Wt Readings from Last 3 Encounters:  02/19/21 175 lb (79.4 kg)  12/04/20 180 lb (81.6 kg)  06/26/20 173 lb 2 oz (78.5 kg)   BMI Readings from Last 3 Encounters:  02/19/21 23.25 kg/m  12/04/20 23.91 kg/m  06/26/20 26.32 kg/m    Assessment/Interventions: Review of patient past medical history, allergies, medications, health status, including review of consultants reports, laboratory and other test data, was performed as part of comprehensive evaluation and provision of chronic care management services.   SDOH:  (Social Determinants of Health) assessments and interventions performed: No  SDOH Screenings   Alcohol Screen: Low Risk   . Last Alcohol Screening Score (AUDIT): 3  Depression (PHQ2-9): Low Risk   . PHQ-2 Score: 0  Financial Resource Strain: Low Risk   . Difficulty of Paying Living Expenses: Not hard at all  Food Insecurity: No Food Insecurity  . Worried About Charity fundraiser in the Last Year: Never true  . Ran Out of Food in the Last Year: Never true  Housing: Low Risk   . Last Housing Risk Score: 0  Physical Activity: Inactive  . Days of Exercise per Week: 0 days  . Minutes of Exercise per Session: 0 min  Social Connections: Moderately Integrated  . Frequency of Communication with Friends and Family: More than three times a week  . Frequency of Social Gatherings with Friends and Family: Not on file  . Attends Religious Services: More than 4 times per year  . Active Member of Clubs or Organizations: No  . Attends Archivist Meetings: Never  . Marital Status: Married  Stress: No Stress Concern Present  . Feeling of  Stress : Not at all  Tobacco Use: Medium Risk  . Smoking Tobacco Use: Former Smoker  . Smokeless Tobacco Use: Never Used  Transportation Needs: No Transportation Needs  . Lack of Transportation (Medical): No  . Lack of Transportation (Non-Medical): No    CCM Care Plan  No Known Allergies  Medications Reviewed Today    Reviewed by Viona Gilmore, [facility] (Pharmacist) on 03/21/21 at 1051  Med List Status: <None>  Medication Order Taking? Sig Documenting Provider Last Dose Status Informant  aspirin EC 81 MG tablet [id number] Yes Take 81 mg by mouth daily. [provider] Taking Active Self  atorvastatin (LIPITOR) 80 MG tablet [id number] Yes TAKE 1 TABLET (80 MG TOTAL) BY MOUTH DAILY. Nafziger, Tommi Rumps, NP Taking Active   calcium carbonate (CALCIUM 600) 600 MG TABS tablet [id number] Yes Take 600 mg by mouth daily with breakfast. [provider] Taking Active   fluticasone (FLONASE) 50 MCG/ACT nasal spray [id number] Yes PLACE 2 SPRAYS INTO BOTH NOSTRILS DAILY. Nafziger, Tommi Rumps, NP Taking Active   hydrocortisone cream 1 % [id number]  Apply 1 application topically 2 (two) times daily as needed for itching. For legs [provider]  Active   olmesartan (BENICAR) 5 MG tablet [id number] Yes TAKE 1 TABLET EVERY DAY Nafziger, Cory, NP Taking Active   tamsulosin (FLOMAX) 0.4 MG CAPS capsule [id number] Yes TAKE 1 CAPSULE  DAILY AFTER SUPPER. Nafziger, Tommi Rumps, NP Taking Active   triamcinolone cream (KENALOG) 0.1 % [id number]  Apply 1 application topically 2 (two) times daily. [name], MD  Active           Patient Active Problem List   Diagnosis Date Noted  . Atherosclerosis of native arteries of extremity with intermittent claudication ([place]) 04/18/2014  . Left leg claudication ([place]) 04/05/2014  . Polymyalgia rheumatica ([place]) 02/26/2014  . DERMATITIS 08/25/2010  . HYPERCHOLESTEROLEMIA 09/26/2007  . Allergic rhinitis 09/26/2007  . GERD 05/27/2007  . BPH (benign prostatic  hyperplasia) 05/27/2007  . History of colonic polyps 05/27/2007    Immunization History  Administered Date(s) Administered  . Influenza Split 08/30/2013  . Influenza Whole 07/30/2010  . Influenza, High Dose Seasonal PF 07/20/2014, 07/02/2015, 07/15/2016, 07/04/2019  . Influenza-Unspecified 08/05/2017, 07/22/2018, 08/19/2020  . PFIZER(Purple Top)SARS-COV-2 Vaccination 12/01/2019, 12/26/2019, 07/19/2020, 02/09/2021  . Pneumococcal Conjugate-13 11/14/2013, 07/22/2018  . Pneumococcal Polysaccharide-23 10/23/2011, 07/22/2018, 07/04/2019  . Tdap 03/09/2018  . Zoster Recombinat (Shingrix) 11/22/2020, 02/09/2021    Conditions to be addressed/monitored:  Hypertension, Hyperlipidemia, BPH, Allergic Rhinitis and Polymyalgia rheumatica  Care Plan : CCM Pharmacy Care Plan  Updates made by Viona Gilmore, [place] since 03/21/2021 12:00 AM    Problem: Problem: Hypertension, Hyperlipidemia, BPH, Allergic Rhinitis and Polymyalgia rheumatica     Long-Range Goal: Patient-Specific Goal   Start Date: 03/21/2021  Expected End Date: 03/21/2022  This Visit's Progress: On track  Priority: High  Note:   Current Barriers:  . Unable to independently monitor therapeutic efficacy  Pharmacist Clinical Goal(s):  Marland Kitchen Patient will achieve adherence to monitoring guidelines and medication adherence to achieve therapeutic efficacy through collaboration with PharmD and provider.   Interventions: . 1:1 collaboration with Dorothyann Peng, NP regarding development and update of comprehensive plan of care as evidenced by provider attestation and co-signature . Inter-disciplinary care team collaboration (see longitudinal plan of care) . Comprehensive medication review performed; medication list updated in electronic medical record  Hypertension (BP goal <140/90) -Controlled -Current treatment: . 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 Aspirin 81 mg 1 tablet daily -Medications previously tried: none  -Recommended to continue current medication  BPH (Goal: minimize symptoms of enlarged prostate) -Controlled -Current treatment  . Tamsulosin 0.4 mg 1 capsule daily after supper -Medications previously tried: none  -Recommended to continue current medication Counseled on if missing doses, can still take a few hours later  Polymyalgia rheumatica (Goal: minimize symptoms) -Controlled -Current treatment  . No medications -Medications previously tried: prednisone  -Recommended to continue as is  Allergic rhinitis (Goal: minimize symptoms) -Controlled -Current treatment   Flonase 50 mcg/act nasal spray 2 sprays in both nostrils daily as needed  Epipen as needed -Medications previously tried: none  -Recommended to continue current  medication   Health Maintenance -Vaccine gaps: none -Current therapy:  . Calcium and vit D 600 mg - 400 units 1 tablet daily  -Educated on Cost vs benefit of each product must be carefully weighed by individual consumer -Patient is satisfied with current therapy and denies issues -Recommended to continue current medication  Patient Goals/Self-Care Activities . Patient will:  - take medications as prescribed check blood pressure weekly, document, and provide at future appointments target a minimum of 150 minutes of moderate intensity exercise weekly  Follow Up Plan: Telephone follow up appointment with care management team member scheduled for: 6 months        Medication Assistance: None required.  Patient affirms current coverage meets needs.  Compliance/Adherence/Medication fill history: Care Gaps: Shingrix  Star-Rating Drugs: Atorvastatin 80 mg - last dispensed 12/05/20 for 90 ds Olmesartan 5 mg - last dispensed 12/16/20 for 90 ds  Patient's preferred pharmacy is:  [facility] ([facility] [place] [facility] [place] [address] [place] ([place]) [place] [address] Phone: [phone] Fax: [phone]  [place] [facility] [place], [place] [address] Phone: [phone] Fax: [phone]  Uses pill box? Yes Pt endorses 98% compliance - sometimes misses evening atorvastatin and tamsulosin when traveling  We discussed: Current pharmacy is preferred with insurance plan and patient is satisfied with pharmacy services Patient decided to: Continue current medication management strategy  Care Plan and Follow Up Patient Decision:  Patient agrees to Care Plan and Follow-up.  Plan: Telephone follow up appointment with care management team member scheduled for:  6 months  Jeni Salles, PharmD [place] Pharmacist [place] at [place] [phone]

## 2021-04-01 ENCOUNTER — Inpatient Hospital Stay: Admission: RE | Admit: 2021-04-01 | Payer: Medicare HMO | Source: Ambulatory Visit

## 2021-04-10 ENCOUNTER — Other Ambulatory Visit: Payer: Self-pay

## 2021-04-10 ENCOUNTER — Ambulatory Visit (INDEPENDENT_AMBULATORY_CARE_PROVIDER_SITE_OTHER)
Admission: RE | Admit: 2021-04-10 | Discharge: 2021-04-10 | Disposition: A | Discharge status: Home / Self Care | Payer: Medicare HMO | Source: Ambulatory Visit | Attending: Adult Health | Admitting: Adult Health

## 2021-04-10 DIAGNOSIS — Z7952 Long term (current) use of systemic steroids: Secondary | ICD-10-CM

## 2021-04-10 DIAGNOSIS — L218 Other seborrheic dermatitis: Secondary | ICD-10-CM | POA: Diagnosis not present

## 2021-04-17 ENCOUNTER — Other Ambulatory Visit: Payer: Medicare HMO

## 2021-06-02 ENCOUNTER — Telehealth: Payer: Self-pay | Admitting: Adult Health

## 2021-06-02 NOTE — Telephone Encounter (Signed)
Left message for patient 05/30/21  to call back and schedule Medicare Annual Wellness Visit (AWV) either virtually or in office.  Left both  my jabber number [phone] and office number    Last AWV 06/26/20  please schedule at anytime with [facility] Nurse Health Advisor 1 or 2   This should be a 45 minute visit.   [place] and will call back to schedule when he gets home

## 2021-06-04 ENCOUNTER — Telehealth: Payer: Self-pay | Admitting: Pharmacist

## 2021-06-04 NOTE — Chronic Care Management (AMB) (Signed)
    Chronic Care Management Pharmacy Assistant   Name: LINDAN BROTHERS  MRN: [id number] DOB: Jul 05, 1946  Reason for Encounter: General Adherence Call    Recent office visits:  None  Recent consult visits:  04-10-2021 - Patient presented to [facility] for  [facility] visits:  None in previous 6 months  Medications: Outpatient Encounter Medications as of 06/04/2021  Medication Sig   aspirin EC 81 MG tablet Take 81 mg by mouth daily.   atorvastatin (LIPITOR) 80 MG tablet TAKE 1 TABLET (80 MG TOTAL) BY MOUTH DAILY.   calcium carbonate (CALCIUM 600) 600 MG TABS tablet Take 600 mg by mouth daily with breakfast.   fluticasone (FLONASE) 50 MCG/ACT nasal spray PLACE 2 SPRAYS INTO BOTH NOSTRILS DAILY.   hydrocortisone cream 1 % Apply 1 application topically 2 (two) times daily as needed for itching. For legs   olmesartan (BENICAR) 5 MG tablet TAKE 1 TABLET EVERY DAY   tamsulosin (FLOMAX) 0.4 MG CAPS capsule TAKE 1 CAPSULE  DAILY AFTER SUPPER.   triamcinolone cream (KENALOG) 0.1 % Apply 1 application topically 2 (two) times daily.   No facility-administered encounter medications on file as of 06/04/2021.  Notes:  Attempted to reach on 8-17 busy tone Attempted to reach on 06-09-21 busy tone 3rd attempt to reach on 06-11-21 same busy tone will reach out to CPP 06-12-21 CPP noted she has no other contact information for patient will re- attempt this assessment in a couple of months he is low risk and due to follow up with Pharmacist in Dec.   Care Gaps: Flu Vaccine - Overdue CCM F/U Call - Scheduled for 09-19-21 10 am AWV - [place] of need to schedule   Star Rating Drugs: Olmesartan ( Benicar) 5 mg - Last filled 03-24-2021 90 DS Atorvastatin (Lipitor) 80 mg - Last filled 03-24-2021 90 DS   [place] Clinical Pharmacist Assistant [phone]

## 2021-07-09 DIAGNOSIS — H5203 Hypermetropia, bilateral: Secondary | ICD-10-CM | POA: Diagnosis not present

## 2021-07-12 ENCOUNTER — Ambulatory Visit (INDEPENDENT_AMBULATORY_CARE_PROVIDER_SITE_OTHER): Payer: Medicare HMO

## 2021-07-12 ENCOUNTER — Other Ambulatory Visit: Payer: Self-pay

## 2021-07-12 DIAGNOSIS — Z0000 Encounter for general adult medical examination without abnormal findings: Secondary | ICD-10-CM

## 2021-07-12 NOTE — Progress Notes (Signed)
I connected with  Brandon Dixon on 07/12/21 by a video enabled telemedicine application and verified that I am speaking with the correct person using two identifiers.   I discussed the limitations of evaluation and management by telemedicine. The patient expressed understanding and agreed to proceed.   Location of Patient: Home Location of Provider: Office     Subjective:   Brandon Dixon is a 75 y.o. male who presents for Medicare Annual/Subsequent preventive examination.  Review of Systems     Cardiac Risk Factors include: none     Objective:    There were no vitals filed for this visit. There is no height or weight on file to calculate BMI.  Advanced Directives 07/12/2021 06/26/2020 01/12/2018 05/23/2014 04/30/2014  Does Patient Have a Medical Advance Directive? Yes Yes Yes Patient has advance directive, copy in chart Patient has advance directive, copy not in chart  Type of Advance Directive [place];Living will [place];Living will Living will;Healthcare Power of Attorney - Living will;Healthcare Power of Attorney  Does patient want to make changes to medical advance directive? Yes (Inpatient - patient defers changing a medical advance directive and declines information at this time) No - Patient declined - - No change requested  Copy of [place] in Chart? Yes - validated most recent copy scanned in chart (See row information) No - copy requested - - -    Current Medications (verified) Outpatient Encounter Medications as of 07/12/2021  Medication Sig   aspirin EC 81 MG tablet Take 81 mg by mouth daily.   atorvastatin (LIPITOR) 80 MG tablet TAKE 1 TABLET (80 MG TOTAL) BY MOUTH DAILY.   calcium carbonate (CALCIUM 600) 600 MG TABS tablet Take 600 mg by mouth daily with breakfast.   fluticasone (FLONASE) 50 MCG/ACT nasal spray PLACE 2 SPRAYS INTO BOTH NOSTRILS DAILY.   hydrocortisone cream 1 % Apply 1 application topically 2  (two) times daily as needed for itching. For legs   olmesartan (BENICAR) 5 MG tablet TAKE 1 TABLET EVERY DAY   tamsulosin (FLOMAX) 0.4 MG CAPS capsule TAKE 1 CAPSULE  DAILY AFTER SUPPER.   triamcinolone cream (KENALOG) 0.1 % Apply 1 application topically 2 (two) times daily.   No facility-administered encounter medications on file as of 07/12/2021.    Allergies (verified) Patient has no known allergies.   History: Past Medical History:  Diagnosis Date   ALLERGIC RHINITIS 09/26/2007   BENIGN PROSTATIC HYPERTROPHY 05/27/2007   COLONIC POLYPS, HX OF 05/27/2007   DERMATITIS 08/25/2010   GERD 05/27/2007   HYPERCHOLESTEROLEMIA 09/26/2007   Peripheral vascular disease [facility])    Past Surgical History:  Procedure Laterality Date   LOWER EXTREMITY ANGIOGRAM Left 04/30/2014   Procedure: LOWER EXTREMITY ANGIOGRAM;  Surgeon: Angelia Mould, MD;  Location: [facility] CATH LAB;  Service: Cardiovascular;  Laterality: Left;   ROTATOR CUFF REPAIR     VASECTOMY     Family History  Problem Relation Age of Onset   Heart disease Mother    Heart attack Mother        22    Cancer Father        lung ca   Hypertension Sister    Diabetes Brother    Hypertension Sister    Diabetes Brother    Diabetes Brother    Diabetes Brother    Diabetes Brother    Diabetes Daughter    Social History   Socioeconomic History   Marital status: Married  Spouse name: Not on file   Number of children: Not on file   Years of education: Not on file   Highest education level: Not on file  Occupational History   Not on file  Tobacco Use   Smoking status: Former    Types: Cigarettes    Quit date: 10/19/2001    Years since quitting: 19.7   Smokeless tobacco: Never  Vaping Use   Vaping Use: Never used  Substance and Sexual Activity   Alcohol use: Yes    Alcohol/week: 6.0 standard drinks    Types: 6 Cans of beer per week   Drug use: No   Sexual activity: Not on file  Other Topics Concern   Not on file  Social  History Narrative   Works as a Physicist, medical    Married    Three daughters    Social Determinants of Radio broadcast assistant Strain: Low Risk    Difficulty of Paying Living Expenses: Not hard at all  Food Insecurity: Not on file  Transportation Needs: No Transportation Needs   Lack of Transportation (Medical): No   Lack of Transportation (Non-Medical): No  Physical Activity: Not on file  Stress: Not on file  Social Connections: Not on file    Tobacco Counseling Counseling given: Not Answered   Clinical Intake:           Diabetes: No  How often do you need to have someone help you when you read instructions, pamphlets, or other written materials from your doctor or pharmacy?: 1 - Never  Diabetic?No        Activities of Daily Living In your present state of health, do you have any difficulty performing the following activities: 07/12/2021  Hearing? N  Walking or climbing stairs? N  Dressing or bathing? N  Doing errands, shopping? N  Preparing Food and eating ? N  Using the Toilet? N  In the past six months, have you accidently leaked urine? N  Do you have problems with loss of bowel control? N  Managing your Medications? N  Managing your Finances? N  Housekeeping or managing your Housekeeping? N  Some recent data might be hidden    Patient Care Team: Dorothyann Peng, NP as PCP - General (Family Medicine) Juanita Craver, MD as Consulting Physician (Gastroenterology) Viona Gilmore, [facility] as Pharmacist (Pharmacist)  Indicate any recent Medical Services you may have received from other than [facility] providers in the past year (date may be approximate).     Assessment:   This is a routine wellness examination for [place].  Hearing/Vision screen No results found.  Dietary issues and exercise activities discussed: Current Exercise Habits: Home exercise routine, Type of exercise: walking, Time (Minutes): 20, Frequency (Times/Week): 7, Weekly Exercise  (Minutes/Week): 140, Intensity: Mild, Exercise limited by: None identified   Goals Addressed   None    Depression Screen PHQ 2/9 Scores 07/12/2021 12/04/2020 06/26/2020 12/01/2019 11/29/2018 11/29/2017 11/20/2016  PHQ - 2 Score 0 0 0 0 0 0 1  PHQ- 9 Score - - 0 - - - -    Fall Risk Fall Risk  07/12/2021 12/04/2020 06/26/2020 12/01/2019 11/29/2018  Falls in the past year? 0 0 0 0 0  Number falls in past yr: - - 0 0 -  Injury with Fall? 0 - 0 0 -  Risk for fall due to : - - - - -  Follow up - - Falls evaluation completed;Falls prevention discussed - -    FALL  RISK PREVENTION PERTAINING TO THE HOME:  Any stairs in or around the home? No  If so, are there any without handrails?  N/a Home free of loose throw rugs in walkways, pet beds, electrical cords, etc? Yes  Adequate lighting in your home to reduce risk of falls? Yes   ASSISTIVE DEVICES UTILIZED TO PREVENT FALLS:  Life alert? No  Use of a cane, walker or w/c? No  Grab bars in the bathroom? No  Shower chair or bench in shower? No  Elevated toilet seat or a handicapped toilet? No   TIMED UP AND GO:  Was the test performed?  N/A .  Length of time to ambulate 10 feet: N/A sec.    Cognitive Function:     6CIT Screen 07/12/2021 06/26/2020  What Year? 0 points 0 points  What month? 0 points 0 points  What time? 0 points 0 points  Count back from 20 0 points 0 points  Months in reverse 0 points 0 points  Repeat phrase 0 points 2 points  Total Score 0 2    Immunizations Immunization History  Administered Date(s) Administered   Influenza Split 08/30/2013   Influenza Whole 07/30/2010   Influenza, High Dose Seasonal PF 07/20/2014, 07/02/2015, 07/15/2016, 07/04/2019   Influenza-Unspecified 08/05/2017, 07/22/2018, 08/19/2020   PFIZER(Purple Top)SARS-COV-2 Vaccination 12/01/2019, 12/26/2019, 07/19/2020, 02/09/2021   Pneumococcal Conjugate-13 11/14/2013, 07/22/2018   Pneumococcal Polysaccharide-23 10/23/2011, 07/22/2018, 07/04/2019    Tdap 03/09/2018   Zoster Recombinat (Shingrix) 11/22/2020, 02/09/2021    TDAP status: Up to date  Flu Vaccine status: Due, Education has been provided regarding the importance of this vaccine. Advised may receive this vaccine at local pharmacy or Health Dept. Aware to provide a copy of the vaccination record if obtained from local pharmacy or Health Dept. Verbalized acceptance and understanding.  Pneumococcal vaccine status: Up to date  Covid-19 vaccine status: Completed vaccines  Qualifies for Shingles Vaccine? Yes   Zostavax completed Yes   Shingrix Completed?: Yes  Screening Tests Health Maintenance  Topic Date Due   INFLUENZA VACCINE  05/19/2021   TETANUS/TDAP  03/09/2028   COVID-19 Vaccine  Completed   Hepatitis C Screening  Completed   Zoster Vaccines- Shingrix  Completed   HPV VACCINES  Aged Out    Health Maintenance  Health Maintenance Due  Topic Date Due   INFLUENZA VACCINE  05/19/2021    Colorectal cancer screening: Type of screening: Colonoscopy. Completed 02/19/20. Repeat every 10 years Patient no longer needs colonoscopy  Lung Cancer Screening: (Low Dose CT Chest recommended if Age 28-80 years, 30 pack-year currently smoking OR have quit w/in 15years.) does not qualify.   Lung Cancer Screening Referral: N/A  Additional Screening:  Hepatitis C Screening: does not qualify; Completed 11/19/2015  Vision Screening: Recommended annual ophthalmology exams for early detection of glaucoma and other disorders of the eye. Is the patient up to date with their annual eye exam?  Yes  Who is the provider or what is the name of the office in which the patient attends annual eye exams? Dr. Delice Lesch in [place] on [address]. If pt is not established with a provider, would they like to be referred to a provider to establish care?  N/A .   Dental Screening: Recommended annual dental exams for proper oral hygiene  Community Resource Referral / Chronic Care  Management: CRR required this visit?  No   CCM required this visit?  No      Plan:     I have personally reviewed  and noted the following in the patient's chart:   Medical and social history Use of alcohol, tobacco or illicit drugs  Current medications and supplements including opioid prescriptions. Patient is not currently taking opioid prescriptions. Functional ability and status Nutritional status Physical activity Advanced directives List of other physicians Hospitalizations, surgeries, and ER visits in previous 12 months Vitals Screenings to include cognitive, depression, and falls Referrals and appointments  In addition, I have reviewed and discussed with patient certain preventive protocols, quality metrics, and best practice recommendations. A written personalized care plan for preventive services as well as general preventive health recommendations were provided to patient.   Non-Face to Face 45 minute visit   Brandon Dixon , Thank you for taking time to come for your Medicare Wellness Visit. I appreciate your ongoing commitment to your health goals. Please review the following plan we discussed and let me know if I can assist you in the future.   These are the goals we discussed:  Goals      Patient Stated     I will continue to play golf.        This is a list of the screening recommended for you and due dates:  Health Maintenance  Topic Date Due   Flu Shot  05/19/2021   Tetanus Vaccine  03/09/2028   COVID-19 Vaccine  Completed   Hepatitis C Screening: USPSTF Recommendation to screen - Ages 18-79 yo.  Completed   Zoster (Shingles) Vaccine  Completed   HPV Vaccine  Aged [address] Tilda Burrow, [place]   07/12/2021   Nurse Notes:

## 2021-07-21 ENCOUNTER — Encounter: Payer: Self-pay | Admitting: Family Medicine

## 2021-07-21 ENCOUNTER — Other Ambulatory Visit: Payer: Self-pay

## 2021-07-21 ENCOUNTER — Ambulatory Visit (INDEPENDENT_AMBULATORY_CARE_PROVIDER_SITE_OTHER): Payer: Medicare HMO | Admitting: Family Medicine

## 2021-07-21 VITALS — BP 126/80 | HR 90 | Temp 99.0°F | Wt 171.2 lb

## 2021-07-21 DIAGNOSIS — M353 Polymyalgia rheumatica: Secondary | ICD-10-CM

## 2021-07-21 DIAGNOSIS — M25511 Pain in right shoulder: Secondary | ICD-10-CM | POA: Diagnosis not present

## 2021-07-21 LAB — C-REACTIVE PROTEIN: CRP: 5 mg/dL (ref 0.5–20.0)

## 2021-07-21 LAB — SEDIMENTATION RATE: Sed Rate: 66 mm/hr — ABNORMAL HIGH (ref 0–20)

## 2021-07-21 MED ORDER — METHYLPREDNISOLONE ACETATE 80 MG/ML IJ SUSP
80.0000 mg | Freq: Once | INTRAMUSCULAR | Status: AC
  Administered 2021-07-21: 80 mg via INTRAMUSCULAR

## 2021-07-21 NOTE — Progress Notes (Signed)
   Subjective:    Patient ID: Brandon Dixon, male    DOB: 1946-07-22, 75 y.o.   MRN: [id number]  HPI Here for what he thinks is another flare of his polymyalgia rheumatica. For the past 2 weeks he has had stiffness over his entire body, and he has had pain in the right shoulder. He had been on a Prednisone taper earlier this year per Dorothyann Peng, his PCP. He was able to stop the Prednisone in February. The patient believes this flare was caused by his having to lift and carry his elderly dog around which has have health issues. He did have to euthanize the dog. Also he played golf last week which he thinks was a mistake. He had a rotator cuff repair in the right shoulder some years ago per Dr. Onnie Graham. He has been taking Ibuprofen with little relief.    Review of Systems  Constitutional: Negative.   Respiratory: Negative.    Cardiovascular: Negative.   Musculoskeletal:  Positive for arthralgias and myalgias.      Objective:   Physical Exam Constitutional:      General: He is not in acute distress.    Appearance: Normal appearance.  Cardiovascular:     Rate and Rhythm: Normal rate and regular rhythm.     Pulses: Normal pulses.     Heart sounds: Normal heart sounds.  Pulmonary:     Effort: Pulmonary effort is normal.     Breath sounds: Normal breath sounds.  Musculoskeletal:     Comments: Right shoulder is not tender, but his ROM is quite limited by pain. There is crepitus present as well   Neurological:     Mental Status: He is alert.          Assessment & Plan:  He is having another flare of his PMR ,and he may have re-injured the right shoulder.we will get a CRP and an ESR. He is given a shot of DepoMedrol. He will follow up with [facility] as needed. Alysia Penna, MD

## 2021-07-21 NOTE — Addendum Note (Signed)
Addended by: Wyvonne Lenz on: 07/21/2021 12:00 PM   Modules accepted: Orders

## 2021-09-09 ENCOUNTER — Other Ambulatory Visit: Payer: Self-pay | Admitting: Adult Health

## 2021-09-09 ENCOUNTER — Encounter: Payer: Self-pay | Admitting: Adult Health

## 2021-09-09 MED ORDER — PREDNISONE 2.5 MG PO TABS: 2.5000 mg | ORAL_TABLET | Freq: Every day | ORAL | 1 refills | Status: DC

## 2021-09-10 ENCOUNTER — Encounter (HOSPITAL_COMMUNITY): Payer: Self-pay | Admitting: Emergency Medicine

## 2021-09-10 ENCOUNTER — Emergency Department (HOSPITAL_COMMUNITY)
Admission: EM | Admit: 2021-09-10 | Discharge: 2021-09-10 | Disposition: A | Discharge status: Home / Self Care | Payer: Medicare HMO | Attending: Emergency Medicine | Admitting: Emergency Medicine

## 2021-09-10 ENCOUNTER — Other Ambulatory Visit: Payer: Self-pay

## 2021-09-10 DIAGNOSIS — M549 Dorsalgia, unspecified: Secondary | ICD-10-CM | POA: Insufficient documentation

## 2021-09-10 DIAGNOSIS — Z79899 Other long term (current) drug therapy: Secondary | ICD-10-CM | POA: Insufficient documentation

## 2021-09-10 DIAGNOSIS — Z7982 Long term (current) use of aspirin: Secondary | ICD-10-CM | POA: Diagnosis not present

## 2021-09-10 DIAGNOSIS — Z87891 Personal history of nicotine dependence: Secondary | ICD-10-CM | POA: Diagnosis not present

## 2021-09-10 DIAGNOSIS — R339 Retention of urine, unspecified: Secondary | ICD-10-CM | POA: Insufficient documentation

## 2021-09-10 DIAGNOSIS — R000 Tachycardia, unspecified: Secondary | ICD-10-CM | POA: Diagnosis not present

## 2021-09-10 LAB — CBC WITH DIFFERENTIAL/PLATELET
Abs Immature Granulocytes: 0.1 10*3/uL — ABNORMAL HIGH (ref 0.00–0.07)
Basophils Absolute: 0 10*3/uL (ref 0.0–0.1)
Basophils Relative: 0 %
Eosinophils Absolute: 0 10*3/uL (ref 0.0–0.5)
Eosinophils Relative: 0 %
HCT: 42.5 % (ref 39.0–52.0)
Hemoglobin: 13.9 g/dL (ref 13.0–17.0)
Immature Granulocytes: 1 %
Lymphocytes Relative: 7 %
Lymphs Abs: 1.3 10*3/uL (ref 0.7–4.0)
MCH: 29.1 pg (ref 26.0–34.0)
MCHC: 32.7 g/dL (ref 30.0–36.0)
MCV: 88.9 fL (ref 80.0–100.0)
Monocytes Absolute: 1.6 10*3/uL — ABNORMAL HIGH (ref 0.1–1.0)
Monocytes Relative: 9 %
Neutro Abs: 15.3 10*3/uL — ABNORMAL HIGH (ref 1.7–7.7)
Neutrophils Relative %: 83 %
Platelets: 294 10*3/uL (ref 150–400)
RBC: 4.78 MIL/uL (ref 4.22–5.81)
RDW: 13.6 % (ref 11.5–15.5)
WBC: 18.4 10*3/uL — ABNORMAL HIGH (ref 4.0–10.5)
nRBC: 0 % (ref 0.0–0.2)

## 2021-09-10 LAB — URINALYSIS, ROUTINE W REFLEX MICROSCOPIC
Bilirubin Urine: NEGATIVE
Glucose, UA: NEGATIVE mg/dL
Ketones, ur: NEGATIVE mg/dL
Nitrite: NEGATIVE
Protein, ur: 30 mg/dL — AB
Specific Gravity, Urine: 1.01 (ref 1.005–1.030)
WBC, UA: >50 WBC/hpf — ABNORMAL HIGH (ref 0–5)
pH: 6 (ref 5.0–8.0)

## 2021-09-10 LAB — COMPREHENSIVE METABOLIC PANEL
ALT: 16 U/L (ref 0–44)
AST: 27 U/L (ref 15–41)
Albumin: 3.3 g/dL — ABNORMAL LOW (ref 3.5–5.0)
Alkaline Phosphatase: 111 U/L (ref 38–126)
Anion gap: 10 (ref 5–15)
BUN: 13 mg/dL (ref 8–23)
CO2: 28 mmol/L (ref 22–32)
Calcium: 9.4 mg/dL (ref 8.9–10.3)
Chloride: 96 mmol/L — ABNORMAL LOW (ref 98–111)
Creatinine, Ser: 0.95 mg/dL (ref 0.61–1.24)
GFR, Estimated: >60 mL/min (ref >60)
Glucose, Bld: 104 mg/dL — ABNORMAL HIGH (ref 70–99)
Potassium: 3.9 mmol/L (ref 3.5–5.1)
Sodium: 134 mmol/L — ABNORMAL LOW (ref 135–145)
Total Bilirubin: 0.6 mg/dL (ref 0.3–1.2)
Total Protein: 7.9 g/dL (ref 6.5–8.1)

## 2021-09-10 MED ORDER — CEPHALEXIN 500 MG PO CAPS: 500.0000 mg | ORAL_CAPSULE | Freq: Two times a day (BID) | ORAL | 0 refills | Status: DC

## 2021-09-10 MED ORDER — SODIUM CHLORIDE 0.9 % IV SOLN
1.0000 g | Freq: Once | INTRAVENOUS | Status: AC
  Administered 2021-09-10: 1 g via INTRAVENOUS
  Filled 2021-09-10: qty 10

## 2021-09-10 MED ORDER — LACTATED RINGERS IV BOLUS
1000.0000 mL | Freq: Once | INTRAVENOUS | Status: AC
  Administered 2021-09-10: 1000 mL via INTRAVENOUS

## 2021-09-10 NOTE — ED Provider Notes (Signed)
[facility] EMERGENCY DEPARTMENT Provider Note   CSN: [id number] Arrival date & time: 09/10/21  1117     History Chief Complaint  Patient presents with   Urinary Retention    Brandon Dixon is a 75 y.o. male with a past medical history of BPH presenting today with a complaint of urinary retention.  Patient has not urinated since yesterday.  This happened in the past however it has been a long time.  Is on Flomax which she has been taking as prescribed.  Prior to retention he did not notice any dysuria or hematuria.  Denies abdominal pain, constipation or diarrhea.  Endorsing some back pain.  No history of kidney stone.  Past Medical History:  Diagnosis Date   ALLERGIC RHINITIS 09/26/2007   BENIGN PROSTATIC HYPERTROPHY 05/27/2007   COLONIC POLYPS, HX OF 05/27/2007   DERMATITIS 08/25/2010   GERD 05/27/2007   HYPERCHOLESTEROLEMIA 09/26/2007   Peripheral vascular disease [facility])     Patient Active Problem List   Diagnosis Date Noted   Atherosclerosis of native arteries of extremity with intermittent claudication ([place]) 04/18/2014   Left leg claudication ([place]) 04/05/2014   Polymyalgia rheumatica ([place]) 02/26/2014   DERMATITIS 08/25/2010   HYPERCHOLESTEROLEMIA 09/26/2007   Allergic rhinitis 09/26/2007   GERD 05/27/2007   BPH (benign prostatic hyperplasia) 05/27/2007   History of colonic polyps 05/27/2007    Past Surgical History:  Procedure Laterality Date   LOWER EXTREMITY ANGIOGRAM Left 04/30/2014   Procedure: LOWER EXTREMITY ANGIOGRAM;  Surgeon: Angelia Mould, MD;  Location: [facility] CATH LAB;  Service: Cardiovascular;  Laterality: Left;   ROTATOR CUFF REPAIR     VASECTOMY         Family History  Problem Relation Age of Onset   Heart disease Mother    Heart attack Mother        45    Cancer Father        lung ca   Hypertension Sister    Diabetes Brother    Hypertension Sister    Diabetes Brother    Diabetes Brother    Diabetes Brother    Diabetes  Brother    Diabetes Daughter     Social History   Tobacco Use   Smoking status: Former    Types: Cigarettes    Quit date: 10/19/2001    Years since quitting: 19.9   Smokeless tobacco: Never  Vaping Use   Vaping Use: Never used  Substance Use Topics   Alcohol use: Yes    Alcohol/week: 6.0 standard drinks    Types: 6 Cans of beer per week   Drug use: No    Home Medications Prior to Admission medications   Medication Sig Start Date End Date Taking? Authorizing Provider  aspirin EC 81 MG tablet Take 81 mg by mouth daily.    [provider]  atorvastatin (LIPITOR) 80 MG tablet TAKE 1 TABLET (80 MG TOTAL) BY MOUTH DAILY. 12/05/20   Nafziger, Tommi Rumps, NP  calcium carbonate (OS-CAL) 600 MG TABS tablet Take 600 mg by mouth daily with breakfast.    [provider]  fluticasone (FLONASE) 50 MCG/ACT nasal spray PLACE 2 SPRAYS INTO BOTH NOSTRILS DAILY. 07/04/20   Nafziger, Tommi Rumps, NP  hydrocortisone cream 1 % Apply 1 application topically 2 (two) times daily as needed for itching. For legs    [provider]  ketoconazole (NIZORAL) 2 % cream Apply 1 application topically daily.    [provider]  olmesartan (BENICAR) 5 MG tablet  TAKE 1 TABLET EVERY DAY 03/21/21   Nafziger, Tommi Rumps, NP  predniSONE (DELTASONE) 2.5 MG tablet Take 1 tablet (2.5 mg total) by mouth daily with breakfast. 09/09/21   Nafziger, Tommi Rumps, NP  tamsulosin (FLOMAX) 0.4 MG CAPS capsule TAKE 1 CAPSULE  DAILY AFTER SUPPER. 12/05/20   Nafziger, Tommi Rumps, NP  triamcinolone cream (KENALOG) 0.1 % Apply 1 application topically 2 (two) times daily. 03/26/16   [name], MD    Allergies    Patient has no known allergies.  Review of Systems   Review of Systems  Gastrointestinal:  Negative for abdominal pain, constipation, diarrhea and vomiting.  Genitourinary:  Positive for difficulty urinating. Negative for dysuria and hematuria.  Musculoskeletal:  Positive for back pain.  All other systems reviewed and  are negative.  Physical Exam Updated Vital Signs BP (!) 150/93 (BP Location: Right Arm)   Pulse (!) 122   Temp 99.2 F (37.3 C) (Oral)   Resp 15   SpO2 96%   Physical Exam Vitals and nursing note reviewed.  Constitutional:      Appearance: Normal appearance.  HENT:     Head: Normocephalic and atraumatic.  Eyes:     General: No scleral icterus.    Conjunctiva/sclera: Conjunctivae normal.  Pulmonary:     Effort: Pulmonary effort is normal. No respiratory distress.  Abdominal:     General: Abdomen is flat.     Palpations: Abdomen is soft.     Tenderness: There is no abdominal tenderness. There is no right CVA tenderness or left CVA tenderness.  Skin:    Findings: No rash.  Neurological:     Mental Status: He is alert.  Psychiatric:        Mood and Affect: Mood normal.    ED Results / Procedures / Treatments   Labs (all labs ordered are listed, but only abnormal results are displayed) Labs Reviewed  URINALYSIS, ROUTINE W REFLEX MICROSCOPIC - Abnormal; Notable for the following components:      Result Value   APPearance HAZY (*)    Hgb urine dipstick MODERATE (*)    Protein, ur 30 (*)    Leukocytes,Ua LARGE (*)    WBC, UA >50 (*)    Bacteria, UA MANY (*)    All other components within normal limits  COMPREHENSIVE METABOLIC PANEL - Abnormal; Notable for the following components:   Sodium 134 (*)    Chloride 96 (*)    Glucose, Bld 104 (*)    Albumin 3.3 (*)    All other components within normal limits  CBC WITH DIFFERENTIAL/PLATELET - Abnormal; Notable for the following components:   WBC 18.4 (*)    Neutro Abs 15.3 (*)    Monocytes Absolute 1.6 (*)    Abs Immature Granulocytes 0.10 (*)    All other components within normal limits  URINE CULTURE    EKG None  Radiology No results found.  Procedures Procedures   Medications Ordered in ED Medications  lactated ringers bolus 1,000 mL (1,000 mLs Intravenous New Bag/Given 09/10/21 1429)  cefTRIAXone  (ROCEPHIN) 1 g in sodium chloride 0.9 % 100 mL IVPB (0 g Intravenous Stopped 09/10/21 1425)    ED Course  I have reviewed the triage vital signs and the nursing notes.  Pertinent labs & imaging results that were available during my care of the patient were reviewed by me and considered in my medical decision making (see chart for details).    MDM Rules/Calculators/A&P Patient was evaluated by me.  He was  in no acute distress.  He has a history of BPH however has not had problems such as urinary retention in many years due to the Flomax.  Today 1500 cc were withdrawn from his bladder.  Also noted to have a leukocytosis of 18.5.  Urinalysis convincing of a urinary tract infection.  Patient has been tachycardic in the department today.  Also with a new right bundle branch block. He is afebrile however at this time we will treat him with a gram of Rocephin and some fluids to bring down his heart rate.  Plan will be to discharge him with antibiotics and a urology follow-up for his new indwelling Foley catheter.  This plan was discussed with the patient and his wife.  During discussion patient's heart rate down to the 90s.  At this time I believe he is stable for discharge.  Final Clinical Impression(s) / ED Diagnoses Final diagnoses:  Urinary retention    Rx / DC Orders Results and diagnoses were explained to the patient. Return precautions discussed in full. Patient had no additional questions and expressed complete understanding.   Darliss Ridgel 09/10/21 1533    Gareth Morgan, MD 09/11/21 [phone]

## 2021-09-10 NOTE — ED Provider Notes (Signed)
Emergency Medicine Provider Triage Evaluation Note  ELFEGO GIAMMARINO , a 75 y.o. male  was evaluated in triage.  Pt complains of urinary retention onset yesterday.  Patient has a history of enlarged prostate, he takes Flomax.  He is followed by his primary care for his enlarged prostate.  Patient has associated left lower abdominal spasms, urinary frequency.  He has not tried medications for symptoms.  Patient denies hematuria, nausea, vomiting, flank pain, chest pain, shortness of breath.   Review of Systems  Positive: Urinary retention Negative: Hematuria, flank pain  Physical Exam  BP (!) 150/93 (BP Location: Right Arm)   Pulse (!) 122   Temp 99.2 F (37.3 C) (Oral)   Resp 15   SpO2 96%  Gen:   Awake, no distress   Resp:  Normal effort  MSK:   Moves extremities without difficulty    Medical Decision Making  Medically screening exam initiated at 12:01 PM.  Appropriate orders placed.  KINSER FELLMAN was informed that the remainder of the evaluation will be completed by another provider, this initial triage assessment does not replace that evaluation, and the importance of remaining in the ED until their evaluation is complete.     [place] A, PA-C 09/10/21 1207    Lorelle Gibbs, DO 09/10/21 1622

## 2021-09-10 NOTE — Discharge Instructions (Addendum)
Please make sure to follow-up with the urologist about your visit today.  Information about Foley catheter is attached to your discharge papers.

## 2021-09-10 NOTE — ED Notes (Signed)
Bladder scan preformed and documented, EDP notified.

## 2021-09-10 NOTE — ED Triage Notes (Signed)
Patient c/o urinary retention/ frequency and left lower abdominal spasms onset of yesterday. Reports hx of enlarged prostate and takes flomax.

## 2021-09-10 NOTE — ED Notes (Signed)
Standard Leg Bag and urinal given to patient. Pt and significant other educated on foley care. Pt and significant other verified understanding of foley maintenance.

## 2021-09-12 LAB — URINE CULTURE: Culture: >=100000 — AB

## 2021-09-13 ENCOUNTER — Telehealth: Payer: Self-pay | Admitting: Emergency Medicine

## 2021-09-13 NOTE — Telephone Encounter (Signed)
Post ED Visit - Positive Culture Follow-up  Culture report reviewed by antimicrobial stewardship pharmacist: [place] Team []  Elenor Quinones, Pharm.D. []  Heide Guile, Pharm.D., BCPS AQ-ID []  Parks Neptune, Pharm.D., BCPS []  Alycia Rossetti, Pharm.D., BCPS []  [place], Pharm.D., BCPS, AAHIVP []  Legrand Como, Pharm.D., BCPS, AAHIVP []  Salome Arnt, PharmD, BCPS []  Johnnette Gourd, PharmD, BCPS []  Hughes Better, PharmD, BCPS [x]  Joetta Manners, PharmD []  Laqueta Linden, PharmD, BCPS []  Albertina Parr, PharmD  [place] Team []  Leodis Sias, PharmD []  Lindell Spar, PharmD []  Royetta Asal, PharmD []  Graylin Shiver, Rph []  Rema Fendt) Glennon Mac, PharmD []  Arlyn Dunning, PharmD []  Netta Cedars, PharmD []  Dia Sitter, PharmD []  Leone Haven, PharmD []  Gretta Arab, PharmD []  Theodis Shove, PharmD []  Peggyann Juba, PharmD []  Reuel Boom, PharmD   Positive urine culture Treated with Cephalexin, organism sensitive to the same and no further patient follow-up is required at this time.  Brandon Dixon 09/13/2021, 1:47 PM

## 2021-09-18 ENCOUNTER — Telehealth: Payer: Self-pay | Admitting: Pharmacist

## 2021-09-18 NOTE — Chronic Care Management (AMB) (Signed)
    Chronic Care Management Pharmacy Assistant   Name: Brandon Dixon  MRN: [id number] DOB: [birth year]  09/19/2021 APPOINTMENT REMINDER  Brandon Dixon was reminded to have all medications, supplements and any blood glucose and blood pressure readings available for review with Jeni Salles, Pharm. D, at his telephone visit on 09/19/2021 at 10:00.   Questions: Have you had any recent office visit or specialist visit outside of [place]? NO  Are there any concerns you would like to discuss during your office visit? Discomfort with catheter waiting to see Urology  Are you having any problems obtaining your medications? (Whether it pharmacy issues or cost) No  If patient has any PAP medications ask if they are having any problems getting their PAP medication or refill? No  Care Gaps: AWV - completed 07/12/2021 Last BP - 126/80 on 07/21/2021  Star Rating Drug: Olmesartan ( Benicar) 5 mg - Last filled 05/08/2021 90 DS at [facility] Atorvastatin (Lipitor) 80 mg - Last filled 05/08/2021 90 DS at [facility] dates verified with Abbie at [facility]  Any gaps in medications fill history? Yes  [place]  Clinical Pharmacist Assistant [phone]

## 2021-09-19 ENCOUNTER — Ambulatory Visit (INDEPENDENT_AMBULATORY_CARE_PROVIDER_SITE_OTHER): Payer: Medicare HMO | Admitting: Pharmacist

## 2021-09-19 VITALS — BP 119/69

## 2021-09-19 DIAGNOSIS — E7800 Pure hypercholesterolemia, unspecified: Secondary | ICD-10-CM

## 2021-09-19 DIAGNOSIS — M353 Polymyalgia rheumatica: Secondary | ICD-10-CM

## 2021-09-19 NOTE — Progress Notes (Signed)
Chronic Care Management Pharmacy Note  09/22/2021 Name:  Brandon Dixon MRN:  [id number] DOB:  September 12, 1946  Summary: BP at goal < 140/90 per home and recent office readings LDL at goal < 100  Recommendations/Changes made from today's visit: -Recommended continued BP monitoring at home -Consider PT for shoulder pain  Plan: -Follow up after discussion with PCP  Subjective: Brandon Dixon is an 75 y.o. year old male who is a primary patient of Dorothyann Peng, NP.  The CCM team was consulted for assistance with disease management and care coordination needs.    Engaged with patient by telephone for follow up visit in response to provider referral for pharmacy case management and/or care coordination services.   Consent to Services:  The patient was given information about Chronic Care Management services, agreed to services, and gave verbal consent prior to initiation of services.  Please see initial visit note for detailed documentation.   Patient Care Team: Dorothyann Peng, NP as PCP - General (Family Medicine) Juanita Craver, MD as Consulting Physician (Gastroenterology) Viona Gilmore, [facility] as Pharmacist (Pharmacist)  Recent office visits: 09/09/21 Patient message: Pt reported still having stiffness. Prescribed 2.5 mg of prednisone daily.  07/21/21 Alysia Penna, MD: Patient presented for polymyalgia rheumatica. Administered steroid injection in shoulder.  07/12/21 Diminique Brooks Sailors, CMA: Patient presented for AWV.  Recent consult visits: 07/09/21 Macarthur Critchley (optometry): Patient presented for eye exam. Unable to access notes.  02/19/21 Deitra Mayo, MD (vasc surgery): Patient presented for initial visit.  Hospital visits: 09/10/21 Patient presented to [facility] ED for urinary retention. Prescribed cephalexin BID. Follow up with urology.   Objective:  Lab Results  Component Value Date   CREATININE 0.95 09/10/2021   BUN 13 09/10/2021   GFR 79.79  12/04/2020   GFRNONAA >60 09/10/2021   GFRAA 110 09/25/2008   NA 134 (L) 09/10/2021   K 3.9 09/10/2021   CALCIUM 9.4 09/10/2021   CO2 28 09/10/2021   GLUCOSE 104 (H) 09/10/2021    Lab Results  Component Value Date/Time   GFR 79.79 12/04/2020 10:39 AM   GFR 96.20 12/01/2019 10:30 AM    Last diabetic Eye exam: No results found for: HMDIABEYEEXA  Last diabetic Foot exam: No results found for: HMDIABFOOTEX   Lab Results  Component Value Date   CHOL 161 12/04/2020   HDL 60.20 12/04/2020   LDLCALC 80 12/04/2020   LDLDIRECT 193.1 09/22/2006   TRIG 108.0 12/04/2020   CHOLHDL 3 12/04/2020    Hepatic Function Latest Ref Rng & Units 09/10/2021 12/04/2020 12/01/2019  Total Protein 6.5 - 8.1 g/dL 7.9 7.6 7.7  Albumin 3.5 - 5.0 g/dL 3.3(L) 4.2 4.3  AST 15 - 41 U/L _0 ALT 0 - 44 U/L _1 Alk Phosphatase 38 - 126 U/L 111 98 123(H)  Total Bilirubin 0.3 - 1.2 mg/dL 0.6 0.5 0.7  Bilirubin, Direct 0.0 - 0.3 mg/dL - - -    Lab Results  Component Value Date/Time   TSH 1.47 12/04/2020 10:39 AM   TSH 1.01 12/01/2019 10:30 AM    CBC Latest Ref Rng & Units 09/10/2021 12/04/2020 12/01/2019  WBC 4.0 - 10.5 K/uL 18.4(H) 9.4 9.5  Hemoglobin 13.0 - 17.0 g/dL 13.9 15.0 15.7  Hematocrit 39.0 - 52.0 % 42.5 44.7 47.6  Platelets 150 - 400 K/uL 294 225.0 209.0    No results found for: VD25OH  Clinical ASCVD: No  The 10-year ASCVD risk score (Arnett DK, et al.,  2019) is: 24.3%   Values used to calculate the score:     Age: 15 years     Sex: Male     Is Non-Hispanic African American: Yes     Diabetic: No     Tobacco smoker: Yes     Systolic Blood Pressure: 060 mmHg     Is BP treated: No     HDL Cholesterol: 60.2 mg/dL     Total Cholesterol: 161 mg/dL    Depression screen [facility] 2/9 07/12/2021 12/04/2020 06/26/2020  Decreased Interest 0 0 0  Down, Depressed, Hopeless 0 0 0  PHQ - 2 Score 0 0 0  Altered sleeping - - 0  Tired, decreased energy - - 0  Change in appetite - - 0  Feeling  bad or failure about yourself  - - 0  Trouble concentrating - - 0  Moving slowly or fidgety/restless - - 0  Suicidal thoughts - - 0  PHQ-9 Score - - 0  Difficult doing work/chores - - Not difficult at all      Social History   Tobacco Use  Smoking Status Former   Types: Cigarettes   Quit date: 10/19/2001   Years since quitting: 19.9  Smokeless Tobacco Never   BP Readings from Last 3 Encounters:  09/10/21 (!) 143/82  07/21/21 126/80  02/19/21 114/73   Pulse Readings from Last 3 Encounters:  09/10/21 (!) 118  07/21/21 90  02/19/21 78   Wt Readings from Last 3 Encounters:  07/21/21 171 lb 4 oz (77.7 kg)  02/19/21 175 lb (79.4 kg)  12/04/20 180 lb (81.6 kg)   BMI Readings from Last 3 Encounters:  07/21/21 22.75 kg/m  02/19/21 23.25 kg/m  12/04/20 23.91 kg/m    Assessment/Interventions: Review of patient past medical history, allergies, medications, health status, including review of consultants reports, laboratory and other test data, was performed as part of comprehensive evaluation and provision of chronic care management services.   SDOH:  (Social Determinants of Health) assessments and interventions performed: No  SDOH Screenings   Alcohol Screen: Not on file  Depression (PHQ2-9): Low Risk    PHQ-2 Score: 0  Financial Resource Strain: Low Risk    Difficulty of Paying Living Expenses: Not hard at all  Food Insecurity: Not on file  Housing: Not on file  Physical Activity: Not on file  Social Connections: Not on file  Stress: Not on file  Tobacco Use: Medium Risk   Smoking Tobacco Use: Former   Smokeless Tobacco Use: Never   Passive Exposure: Not on file  Transportation Needs: No Transportation Needs   Lack of Transportation (Medical): No   Lack of Transportation (Non-Medical): No    CCM Care Plan  No Known Allergies  Medications Reviewed Today     Reviewed by Viona Gilmore, [facility] (Pharmacist) on 09/19/21 at [place] List Status: <None>    Medication Order Taking? Sig Documenting Provider Last Dose Status Informant  aspirin EC 81 MG tablet [id number]  Take 81 mg by mouth daily. [provider]  Active Self  atorvastatin (LIPITOR) 80 MG tablet [id number]  TAKE 1 TABLET (80 MG TOTAL) BY MOUTH DAILY.  Patient taking differently: Take 80 mg by mouth every evening.   Nafziger, Tommi Rumps, NP  Active Self  calcium carbonate (OS-CAL) 600 MG TABS tablet [id number]  Take 600 mg by mouth daily with breakfast. [provider]  Active Self           Med Note (ABDAL-RAFI, MUHAMMAD I  Wed Sep 10, 2021  2:32 PM) On Hold.  cephALEXin (KEFLEX) 500 MG capsule [id number] Yes Take 1 capsule (500 mg total) by mouth 2 (two) times daily. Redwine, Madison A, PA-C Taking Active   fluticasone (FLONASE) 50 MCG/ACT nasal spray [id number]  PLACE 2 SPRAYS INTO BOTH NOSTRILS DAILY.  Patient taking differently: 2 sprays daily as needed for allergies.   Nafziger, Tommi Rumps, NP  Active Self  hydrocortisone cream 1 % [id number] Yes Apply 1 application topically daily as needed for itching. For legs [provider] Taking Active Self  hydrocortisone ointment 0.5 % [id number] Yes Apply 1 application topically daily as needed for itching. [provider] Taking Active Self  olmesartan (BENICAR) 5 MG tablet [id number]  TAKE 1 TABLET EVERY DAY  Patient taking differently: Take 5 mg by mouth every evening.   Nafziger, Tommi Rumps, NP  Active Self  predniSONE (DELTASONE) 2.5 MG tablet [id number] Yes Take 1 tablet (2.5 mg total) by mouth daily with breakfast. Dorothyann Peng, NP Taking Active Self  tamsulosin (FLOMAX) 0.4 MG CAPS capsule [id number]  TAKE 1 CAPSULE  DAILY AFTER SUPPER. Dorothyann Peng, NP  Active Self            Patient Active Problem List   Diagnosis Date Noted   Atherosclerosis of native arteries of extremity with intermittent claudication ([place]) 04/18/2014   Left leg claudication ([place]) 04/05/2014   Polymyalgia rheumatica ([place])  02/26/2014   DERMATITIS 08/25/2010   HYPERCHOLESTEROLEMIA 09/26/2007   Allergic rhinitis 09/26/2007   GERD 05/27/2007   BPH (benign prostatic hyperplasia) 05/27/2007   History of colonic polyps 05/27/2007    Immunization History  Administered Date(s) Administered   Influenza Split 08/30/2013   Influenza Whole 07/30/2010   Influenza, High Dose Seasonal PF 07/20/2014, 07/02/2015, 07/15/2016, 07/04/2019, 07/28/2021   Influenza-Unspecified 08/05/2017, 07/22/2018, 08/19/2020   PFIZER Comirnaty(Gray Top)Covid-19 Tri-Sucrose Vaccine 07/28/2021   PFIZER(Purple Top)SARS-COV-2 Vaccination 12/01/2019, 12/26/2019, 07/19/2020, 02/09/2021   Pneumococcal Conjugate-13 11/14/2013, 07/22/2018   Pneumococcal Polysaccharide-23 10/23/2011, 07/22/2018, 07/04/2019   Tdap 03/09/2018   Zoster Recombinat (Shingrix) 11/22/2020, 02/09/2021   Patient reports he has only been on prednisone for a week and is still having some back and shoulder pain but this has improved even with the low dose.  Patient is still having trouble with the catheter and has [facility] app[facility] scheduled for the 8th of December to get it removed.   Patient inquired about the results of his most recent EKG and if he needs to have a repeat one or if there is a plan for that. He denies feeling dizzy or lightheaded and reports the following recent pulse readings: 94, 82, 104, 73, 92, 91, 94, 82. He also thought his pain may be affecting this and doesn't know if he needs a repeat MRI for his shoulder pain or if PT would benefit him.   Conditions to be addressed/monitored:  Hypertension, Hyperlipidemia, BPH, Allergic Rhinitis and Polymyalgia rheumatica  Conditions addressed this visit: Hypertension, polymyalgia rheumatica, hyperlipidemia  Care Plan : CCM Pharmacy Care Plan  Updates made by Viona Gilmore, [place] since 09/22/2021 12:00 AM     Problem: Problem: Hypertension, Hyperlipidemia, BPH, Allergic Rhinitis and Polymyalgia  rheumatica      Long-Range Goal: Patient-Specific Goal   Start Date: 03/21/2021  Expected End Date: 03/21/2022  Recent Progress: On track  Priority: High  Note:   Current Barriers:  Unable to independently monitor therapeutic efficacy  Pharmacist Clinical Goal(s):  Patient will achieve adherence to monitoring guidelines and medication  adherence to achieve therapeutic efficacy through collaboration with PharmD and provider.   Interventions: 1:1 collaboration with Dorothyann Peng, NP regarding development and update of comprehensive plan of care as evidenced by provider attestation and co-signature Inter-disciplinary care team collaboration (see longitudinal plan of care) Comprehensive medication review performed; medication list updated in electronic medical record  Hypertension (BP goal <140/90) -Controlled -Current treatment: Olmesartan 5 mg 1 tablet daily  -Medications previously tried: none  -Current home readings: 119/69 HR 97; 136/62, 114/59 (arm cuff) -Current pulse: 94, 82, 104, 73, 92, 91, 94, 82 -Current dietary habits: limits salt intake -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 -Denies hypotensive/hypertensive symptoms -Educated on BP goals and benefits of medications for prevention of heart attack, stroke and kidney damage; Exercise goal of 150 minutes per week; Importance of home blood pressure monitoring; Proper BP monitoring technique; -Counseled to monitor BP at home weekly, document, and provide log at future appointments -Counseled on diet and exercise extensively Recommended to continue current medication  Hyperlipidemia: (LDL goal < 100) -Controlled -Current treatment: Atorvastatin 80 mg 1 tablet daily -Medications previously tried: none  -Current dietary patterns: did not discuss -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 -Educated on Cholesterol goals;  Benefits of statin for ASCVD risk reduction; Exercise  goal of 150 minutes per week; -Counseled on diet and exercise extensively Recommended to continue current medication  Arterial atherosclerosis (Goal: prevent heart events) -Controlled -Current treatment  Aspirin 81 mg 1 tablet daily -Medications previously tried: none  -Recommended to continue current medication  BPH (Goal: minimize symptoms of enlarged prostate) -Controlled -Current treatment  Tamsulosin 0.4 mg 1 capsule daily after supper -Medications previously tried: none  -Recommended to continue current medication Counseled on if missing doses, can still take a few hours later  Polymyalgia rheumatica (Goal: minimize symptoms) -Controlled -Current treatment  Prednisone 2.5 mg 1 tablet daily -Medications previously tried: none -Recommended to continue current medication  Allergic rhinitis (Goal: minimize symptoms) -Controlled -Current treatment  Flonase 50 mcg/act nasal spray 2 sprays in both nostrils daily as needed Epipen as needed -Medications previously tried: none  -Recommended to continue current medication   Health Maintenance -Vaccine gaps: none -Current therapy:  Calcium and vit D 600 mg - 400 units 1 tablet daily  -Educated on Cost vs benefit of each product must be carefully weighed by individual consumer -Patient is satisfied with current therapy and denies issues -Recommended to continue current medication  Patient Goals/Self-Care Activities Patient will:  - take medications as prescribed check blood pressure weekly, document, and provide at future appointments target a minimum of 150 minutes of moderate intensity exercise weekly  Follow Up Plan: The care management team will reach out to the patient again over the next 30 days.         Medication Assistance: None required.  Patient affirms current coverage meets needs.  Compliance/Adherence/Medication fill history: Care Gaps: Shingrix  Star-Rating Drugs: Olmesartan ( Benicar) 5 mg - Last  filled 05/08/2021 90 DS at [facility] Atorvastatin (Lipitor) 80 mg - Last filled 05/08/2021 90 DS at [facility]  Patient's preferred pharmacy is:  [facility] ([facility] [place] [address] [place] ([place]) [place] [address] Phone: [phone] Fax: [phone]  [place], [place] [address] Phone: [phone] Fax: [phone]  Uses pill box? Yes Pt endorses 98% compliance - sometimes misses evening atorvastatin and tamsulosin when  traveling  We discussed: Current pharmacy is preferred with insurance plan and patient is satisfied with pharmacy services Patient decided to: Continue current medication management strategy  Care Plan and Follow Up Patient Decision:  Patient agrees to Care Plan and Follow-up.  Plan: The care management team will reach out to the patient again over the next 30 days.  Jeni Salles, PharmD [facility] Clinical Pharmacist [place] [facility] [place]

## 2021-09-22 ENCOUNTER — Other Ambulatory Visit: Payer: Self-pay | Admitting: Adult Health

## 2021-09-22 NOTE — Patient Instructions (Signed)
Hi Brandon Dixon,  It was great to get to catch up with you! I will reach back out once I hear back from [facility].  Please reach out to me if you have any questions or need anything!  Best, Brandon Dixon  Brandon Dixon, PharmD, [place] at [place]   Visit Information   Goals Addressed   None    Patient Care Plan: CCM Pharmacy Care Plan     Problem Identified: Problem: Hypertension, Hyperlipidemia, BPH, Allergic Rhinitis and Polymyalgia rheumatica      Long-Range Goal: Patient-Specific Goal   Start Date: 03/21/2021  Expected End Date: 03/21/2022  Recent Progress: On track  Priority: High  Note:   Current Barriers:  Unable to independently monitor therapeutic efficacy  Pharmacist Clinical Goal(s):  Patient will achieve adherence to monitoring guidelines and medication adherence to achieve therapeutic efficacy through collaboration with PharmD and provider.   Interventions: 1:1 collaboration with Dorothyann Peng, NP regarding development and update of comprehensive plan of care as evidenced by provider attestation and co-signature Inter-disciplinary care team collaboration (see longitudinal plan of care) Comprehensive medication review performed; medication list updated in electronic medical record  Hypertension (BP goal <140/90) -Controlled -Current treatment: Olmesartan 5 mg 1 tablet daily  -Medications previously tried: none  -Current home readings: 119/69 HR 97; 136/62, 114/59 (arm cuff) -Current pulse: 94, 82, 104, 73, 92, 91, 94, 82 -Current dietary habits: limits salt intake -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 -Denies hypotensive/hypertensive symptoms -Educated on BP goals and benefits of medications for prevention of heart attack, stroke and kidney damage; Exercise goal of 150 minutes per week; Importance of home blood pressure monitoring; Proper BP monitoring technique; -Counseled to monitor BP  at home weekly, document, and provide log at future appointments -Counseled on diet and exercise extensively Recommended to continue current medication  Hyperlipidemia: (LDL goal < 100) -Controlled -Current treatment: Atorvastatin 80 mg 1 tablet daily -Medications previously tried: none  -Current dietary patterns: did not discuss -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 -Educated on Cholesterol goals;  Benefits of statin for ASCVD risk reduction; Exercise goal of 150 minutes per week; -Counseled on diet and exercise extensively Recommended to continue current medication  Arterial atherosclerosis (Goal: prevent heart events) -Controlled -Current treatment  Aspirin 81 mg 1 tablet daily -Medications previously tried: none  -Recommended to continue current medication  BPH (Goal: minimize symptoms of enlarged prostate) -Controlled -Current treatment  Tamsulosin 0.4 mg 1 capsule daily after supper -Medications previously tried: none  -Recommended to continue current medication Counseled on if missing doses, can still take a few hours later  Polymyalgia rheumatica (Goal: minimize symptoms) -Controlled -Current treatment  Prednisone 2.5 mg 1 tablet daily -Medications previously tried: none -Recommended to continue current medication  Allergic rhinitis (Goal: minimize symptoms) -Controlled -Current treatment  Flonase 50 mcg/act nasal spray 2 sprays in both nostrils daily as needed Epipen as needed -Medications previously tried: none  -Recommended to continue current medication   Health Maintenance -Vaccine gaps: none -Current therapy:  Calcium and vit D 600 mg - 400 units 1 tablet daily  -Educated on Cost vs benefit of each product must be carefully weighed by individual consumer -Patient is satisfied with current therapy and denies issues -Recommended to continue current medication  Patient Goals/Self-Care Activities Patient will:  - take  medications as prescribed check blood pressure weekly, document, and provide at future appointments target a minimum of 150 minutes of moderate intensity exercise weekly  Follow  Up Plan: The care management team will reach out to the patient again over the next 30 days.        Patient verbalizes understanding of instructions provided today and agrees to view in [place].  The pharmacy team will reach out to the patient again over the next 30 days.   Viona Gilmore, [facility]

## 2021-09-25 DIAGNOSIS — R338 Other retention of urine: Secondary | ICD-10-CM | POA: Diagnosis not present

## 2021-09-25 DIAGNOSIS — N3000 Acute cystitis without hematuria: Secondary | ICD-10-CM | POA: Diagnosis not present

## 2021-09-26 DIAGNOSIS — R338 Other retention of urine: Secondary | ICD-10-CM | POA: Diagnosis not present

## 2021-10-03 ENCOUNTER — Encounter: Payer: Self-pay | Admitting: Adult Health

## 2021-10-03 DIAGNOSIS — R338 Other retention of urine: Secondary | ICD-10-CM | POA: Diagnosis not present

## 2021-10-07 ENCOUNTER — Other Ambulatory Visit: Payer: Self-pay | Admitting: Adult Health

## 2021-10-07 DIAGNOSIS — N400 Enlarged prostate without lower urinary tract symptoms: Secondary | ICD-10-CM

## 2021-10-08 ENCOUNTER — Other Ambulatory Visit (INDEPENDENT_AMBULATORY_CARE_PROVIDER_SITE_OTHER): Payer: Medicare HMO

## 2021-10-08 DIAGNOSIS — N400 Enlarged prostate without lower urinary tract symptoms: Secondary | ICD-10-CM | POA: Diagnosis not present

## 2021-10-08 LAB — PSA: PSA: 3.51 ng/mL (ref 0.10–4.00)

## 2021-10-18 DIAGNOSIS — N400 Enlarged prostate without lower urinary tract symptoms: Secondary | ICD-10-CM | POA: Diagnosis not present

## 2021-10-18 DIAGNOSIS — I10 Essential (primary) hypertension: Secondary | ICD-10-CM

## 2021-10-18 DIAGNOSIS — E7800 Pure hypercholesterolemia, unspecified: Secondary | ICD-10-CM | POA: Diagnosis not present

## 2021-10-28 DIAGNOSIS — R338 Other retention of urine: Secondary | ICD-10-CM | POA: Diagnosis not present

## 2021-10-30 DIAGNOSIS — R338 Other retention of urine: Secondary | ICD-10-CM | POA: Diagnosis not present

## 2021-10-30 DIAGNOSIS — N319 Neuromuscular dysfunction of bladder, unspecified: Secondary | ICD-10-CM | POA: Diagnosis not present

## 2021-11-03 ENCOUNTER — Other Ambulatory Visit: Payer: Self-pay | Admitting: Urology

## 2021-11-04 DIAGNOSIS — R338 Other retention of urine: Secondary | ICD-10-CM | POA: Diagnosis not present

## 2021-11-04 DIAGNOSIS — N401 Enlarged prostate with lower urinary tract symptoms: Secondary | ICD-10-CM | POA: Diagnosis not present

## 2021-11-05 ENCOUNTER — Encounter: Payer: Self-pay | Admitting: Adult Health

## 2021-11-05 ENCOUNTER — Ambulatory Visit (INDEPENDENT_AMBULATORY_CARE_PROVIDER_SITE_OTHER): Payer: Medicare HMO | Admitting: Adult Health

## 2021-11-05 ENCOUNTER — Other Ambulatory Visit: Payer: Self-pay

## 2021-11-05 ENCOUNTER — Ambulatory Visit (INDEPENDENT_AMBULATORY_CARE_PROVIDER_SITE_OTHER): Payer: Medicare HMO

## 2021-11-05 VITALS — BP 128/80 | HR 83 | Temp 97.9°F | Ht 72.9 in | Wt 177.0 lb

## 2021-11-05 DIAGNOSIS — M25511 Pain in right shoulder: Secondary | ICD-10-CM

## 2021-11-05 DIAGNOSIS — G8929 Other chronic pain: Secondary | ICD-10-CM

## 2021-11-05 DIAGNOSIS — M545 Low back pain, unspecified: Secondary | ICD-10-CM

## 2021-11-05 LAB — POCT URINALYSIS DIPSTICK
Bilirubin, UA: NEGATIVE
Blood, UA: NEGATIVE
Glucose, UA: NEGATIVE
Ketones, UA: NEGATIVE
Leukocytes, UA: NEGATIVE
Nitrite, UA: NEGATIVE
Protein, UA: NEGATIVE
Spec Grav, UA: 1.01 (ref 1.010–1.025)
Urobilinogen, UA: 0.2 E.U./dL
pH, UA: 6.5 (ref 5.0–8.0)

## 2021-11-05 IMAGING — DX DG SHOULDER 2+V*R*
3 series · 3 of 3 positions shown · non-contrast
Comparison: Chronic right shoulder pain.

CLINICAL DATA: Chronic right shoulder pain.

EXAM:
RIGHT SHOULDER - 2+ VIEW

[shoulder internal rotation ap]
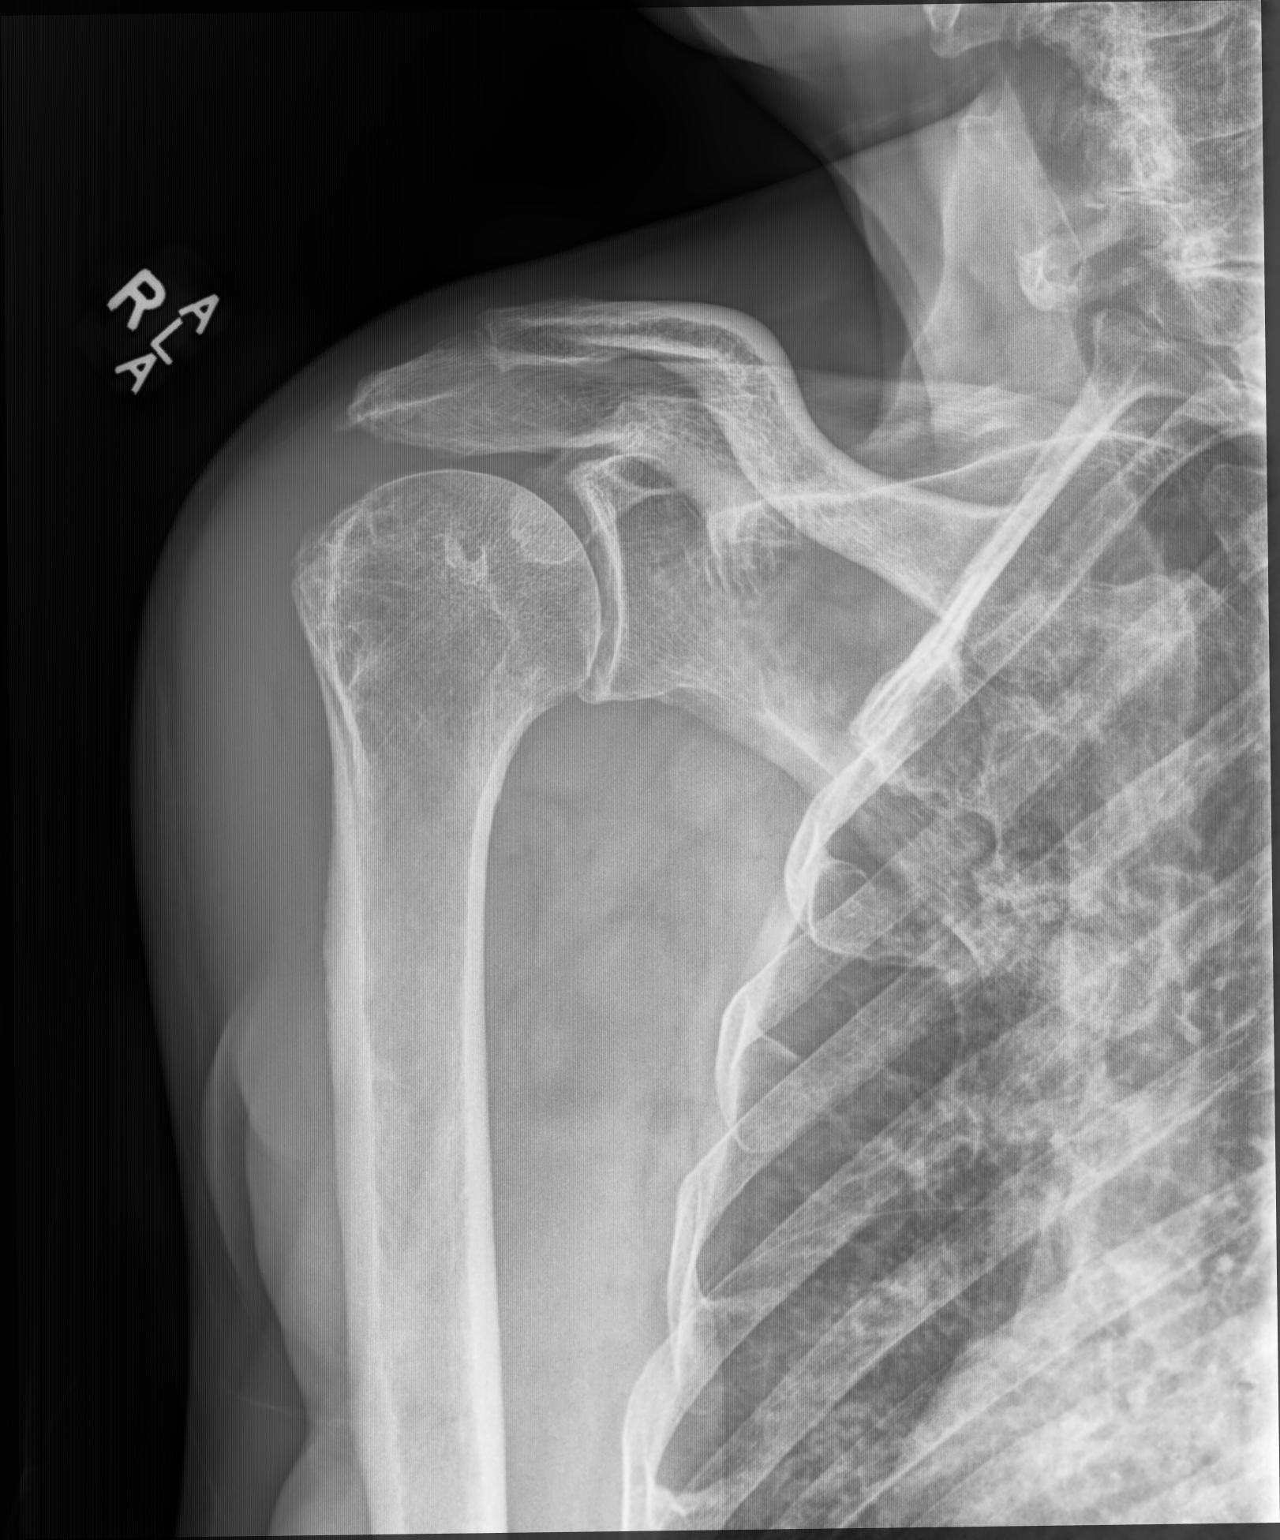

[shoulder (y view)]
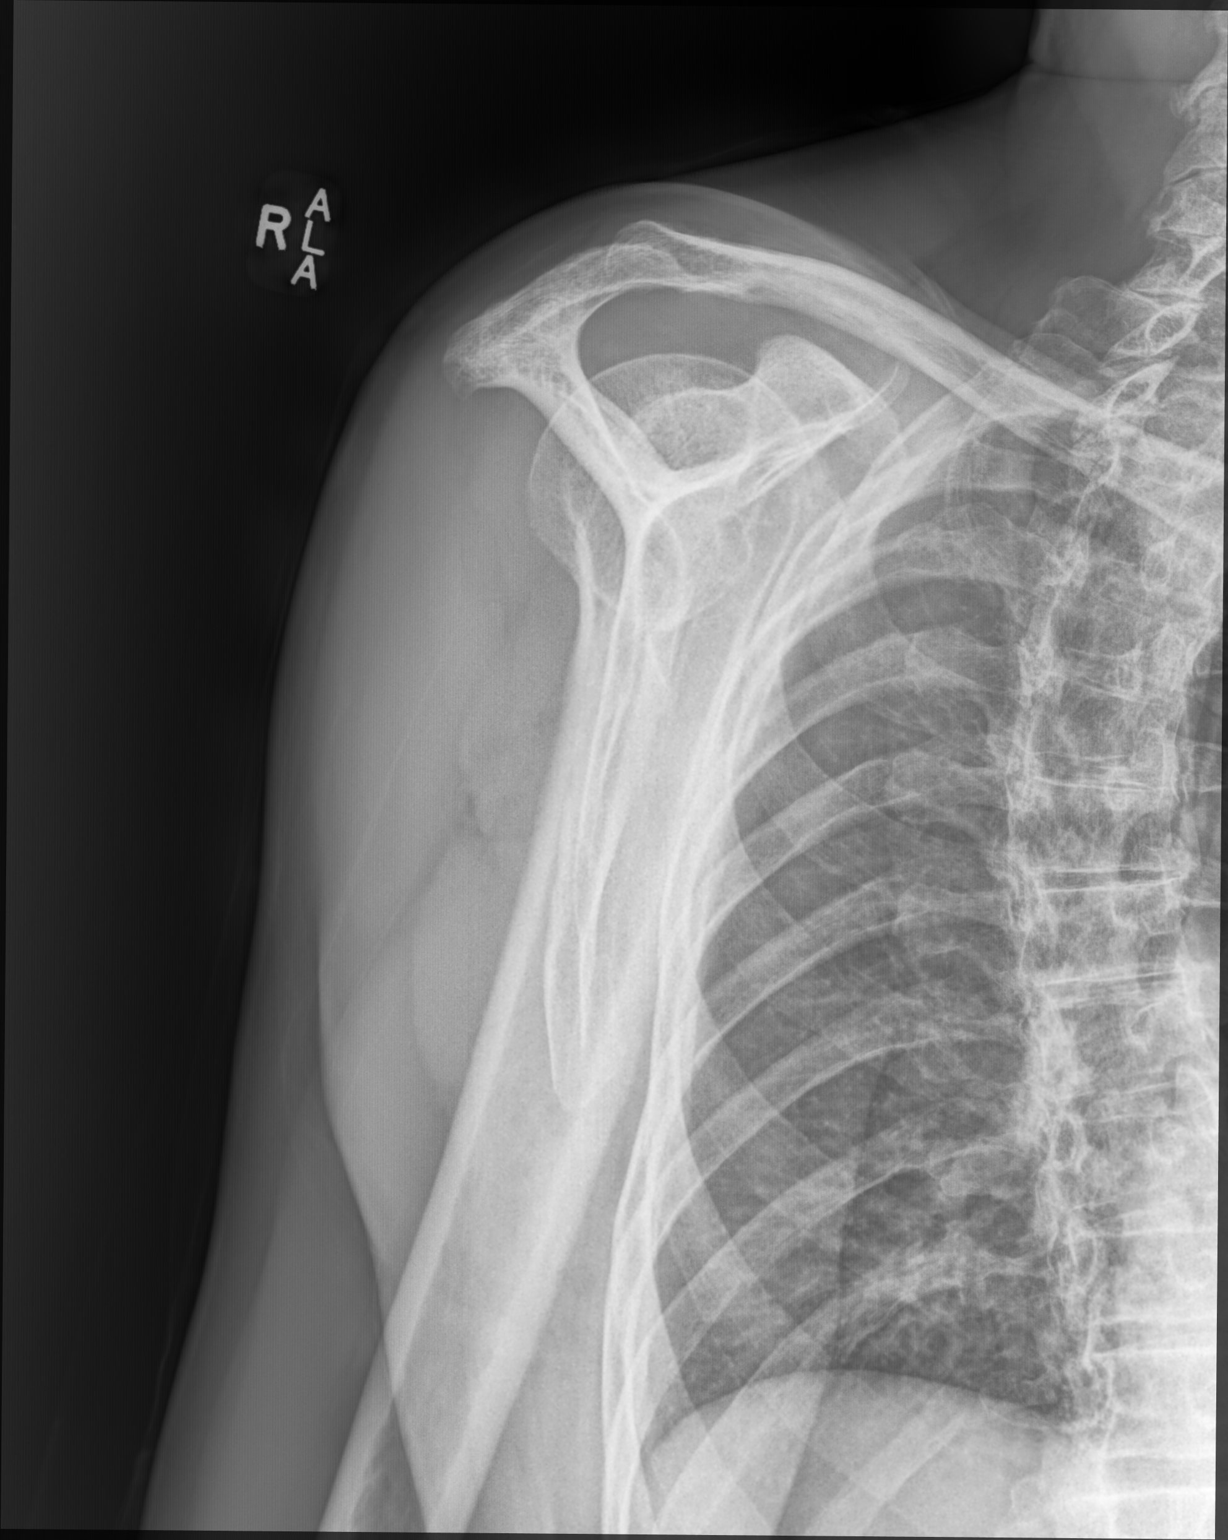

[shoulder (axial)]
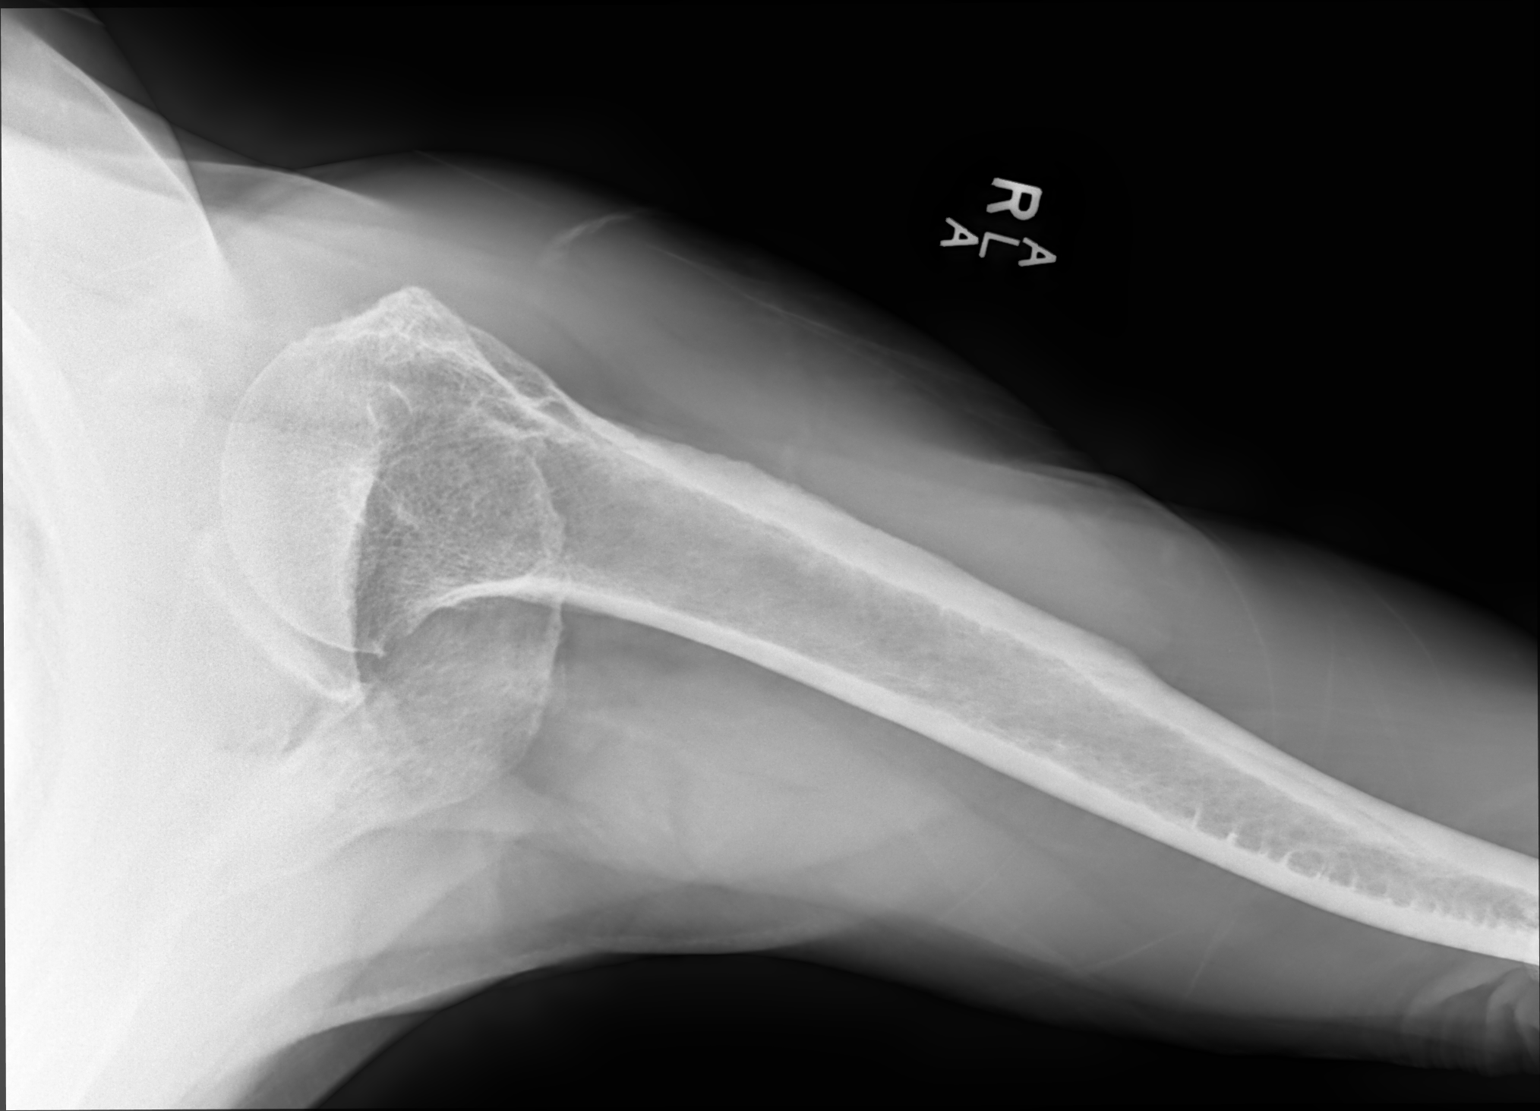

[3 of 3 positions shown; findings below may reference images not displayed]

FINDINGS: Mildly decreased bone mineralization. Mild-to-moderate glenohumeral
joint space narrowing. Mild peripheral degenerative osteophytosis.

Mild acromioclavicular joint space narrowing and peripheral
osteophytosis. No acute fracture is seen. No dislocation. The
visualized portion of the right lung is unremarkable.
IMPRESSION: Mild-to-moderate glenohumeral osteoarthritis.

## 2021-11-05 MED ORDER — METHYLPREDNISOLONE ACETATE 80 MG/ML IJ SUSP
80.0000 mg | Freq: Once | INTRAMUSCULAR | Status: AC
  Administered 2021-11-05: 80 mg via INTRA_ARTICULAR

## 2021-11-05 NOTE — Progress Notes (Signed)
Subjective:    Patient ID: Brandon Dixon, male    DOB: 07/10/1946, 76 y.o.   MRN: [id number]  HPI 76 year old male who  has a past medical history of ALLERGIC RHINITIS (09/26/2007), BENIGN PROSTATIC HYPERTROPHY (05/27/2007), COLONIC POLYPS, HX OF (05/27/2007), DERMATITIS (08/25/2010), GERD (05/27/2007), HYPERCHOLESTEROLEMIA (09/26/2007), and Peripheral vascular disease ([place]).  He presents to the office today for multiple issues.   His first complaint is that of right low back pain x a few days. Denies burning with urination or hematuria. Pain is felt as a dull pain.  Reports that it feels similar to when he had a UTI for which he was recently treated for with two rounds of antibiotics, Cipro finished in early December. He does self cath if he does not void enough on his own. He denies fevers or chills. Pain has been constant.   Also complains of chronic right shoulder pain. This has been an ongoing issue. He was last seen in the office in October 2022 at which time he was given an IM injection of depo medrol which lasted for a good 2-3 months. Has loss of ROM and a " sharp" pain with movement. Does have a history of previous rotator cuff repair of the right shoulder.    Review of Systems See HPI   Past Medical History:  Diagnosis Date   ALLERGIC RHINITIS 09/26/2007   BENIGN PROSTATIC HYPERTROPHY 05/27/2007   COLONIC POLYPS, HX OF 05/27/2007   DERMATITIS 08/25/2010   GERD 05/27/2007   HYPERCHOLESTEROLEMIA 09/26/2007   Peripheral vascular disease ([place])     Social History   Socioeconomic History   Marital status: Married    Spouse name: Not on file   Number of children: Not on file   Years of education: Not on file   Highest education level: Not on file  Occupational History   Not on file  Tobacco Use   Smoking status: Former    Types: Cigarettes    Quit date: 10/19/2001    Years since quitting: 20.0   Smokeless tobacco: Never  Vaping Use   Vaping Use: Never used  Substance and Sexual  Activity   Alcohol use: Yes    Alcohol/week: 6.0 standard drinks    Types: 6 Cans of beer per week   Drug use: No   Sexual activity: Not on file  Other Topics Concern   Not on file  Social History Narrative   Works as a Physicist, medical    Married    Three daughters    Social Determinants of Radio broadcast assistant Strain: Not on file  Food Insecurity: Not on file  Transportation Needs: Not on file  Physical Activity: Not on file  Stress: Not on file  Social Connections: Not on file  Intimate Partner Violence: Not on file    Past Surgical History:  Procedure Laterality Date   LOWER EXTREMITY ANGIOGRAM Left 04/30/2014   Procedure: LOWER EXTREMITY ANGIOGRAM;  Surgeon: Angelia Mould, MD;  Location: [facility] CATH LAB;  Service: Cardiovascular;  Laterality: Left;   ROTATOR CUFF REPAIR     VASECTOMY      Family History  Problem Relation Age of Onset   Heart disease Mother    Heart attack Mother        66    Cancer Father        lung ca   Hypertension Sister    Diabetes Brother    Hypertension Sister    Diabetes Brother  Diabetes Brother    Diabetes Brother    Diabetes Brother    Diabetes Daughter     No Known Allergies  Current Outpatient Medications on File Prior to Visit  Medication Sig Dispense Refill   aspirin EC 81 MG tablet Take 81 mg by mouth daily.     atorvastatin (LIPITOR) 80 MG tablet TAKE 1 TABLET (80 MG TOTAL) BY MOUTH DAILY. (Patient taking differently: Take 80 mg by mouth every evening.) 90 tablet 3   calcium carbonate (OS-CAL) 600 MG TABS tablet Take 600 mg by mouth daily with breakfast.     cephALEXin (KEFLEX) 500 MG capsule Take 1 capsule (500 mg total) by mouth 2 (two) times daily. 20 capsule 0   fluticasone (FLONASE) 50 MCG/ACT nasal spray PLACE 2 SPRAYS INTO BOTH NOSTRILS DAILY. (Patient taking differently: 2 sprays daily as needed for allergies.) 48 g 4   hydrocortisone cream 1 % Apply 1 application topically daily as needed for itching.  For legs     hydrocortisone ointment 0.5 % Apply 1 application topically daily as needed for itching.     olmesartan (BENICAR) 5 MG tablet TAKE 1 TABLET EVERY DAY 90 tablet 0   predniSONE (DELTASONE) 2.5 MG tablet Take 1 tablet (2.5 mg total) by mouth daily with breakfast. 30 tablet 1   tamsulosin (FLOMAX) 0.4 MG CAPS capsule TAKE 1 CAPSULE  DAILY AFTER SUPPER. 90 capsule 3   No current facility-administered medications on file prior to visit.    BP 128/80    Pulse 83    Temp 97.9 F (36.6 C) (Oral)    Ht 6' 0.9" (1.852 m)    Wt 177 lb (80.3 kg)    SpO2 97%    BMI 23.42 kg/m       Objective:   Physical Exam Vitals and nursing note reviewed.  Constitutional:      Appearance: Normal appearance.  Cardiovascular:     Rate and Rhythm: Normal rate and regular rhythm.  Musculoskeletal:     Right shoulder: Tenderness and bony tenderness present. No crepitus. Decreased range of motion. Decreased strength. Normal pulse.     Lumbar back: Tenderness present. No bony tenderness. Normal range of motion.       Back:  Skin:    General: Skin is warm and dry.     Capillary Refill: Capillary refill takes less than 2 seconds.  Neurological:     Mental Status: He is alert.  Psychiatric:        Mood and Affect: Mood normal.        Behavior: Behavior normal.        Thought Content: Thought content normal.        Judgment: Judgment normal.       Assessment & Plan:  1. Low back pain, unspecified back pain laterality, unspecified chronicity, unspecified whether sciatica present  - POC Urinalysis Dipstick- negative  - Appears muscular in nature.  - Will stay away from [facility] relaxer d/t concern of urinary retention - Advised warm compress/gentle stretching exercises/muscle rub   2. Chronic right shoulder pain - Concern for recurrent rotator cuff issue vs osteoarthritis. Will do xray and shoulder injection today. Consider MRI in the future.  Shoulder injection Verbal consent obtained and  verified. Sterile betadine prep. Furthur cleansed with alcohol. Topical analgesic spray: Ethyl chloride. Joint: right subacromial injection Approached in typical fashion with: posterior approach Completed without difficulty Meds: 3 cc lidocaine 2% no epi, 1 cc depomedrol 80mg /cc Needle:1.5 inch 25 gauge Aftercare  instructions and Red flags advised. Immediate improvement in pain noted  - DG Shoulder Right; Future - methylPREDNISolone acetate (DEPO-MEDROL) injection 80 mg  Dorothyann Peng, NP

## 2021-11-20 DIAGNOSIS — N319 Neuromuscular dysfunction of bladder, unspecified: Secondary | ICD-10-CM | POA: Diagnosis not present

## 2021-11-20 DIAGNOSIS — R338 Other retention of urine: Secondary | ICD-10-CM | POA: Diagnosis not present

## 2021-11-20 DIAGNOSIS — N401 Enlarged prostate with lower urinary tract symptoms: Secondary | ICD-10-CM | POA: Diagnosis not present

## 2021-11-21 ENCOUNTER — Ambulatory Visit (HOSPITAL_BASED_OUTPATIENT_CLINIC_OR_DEPARTMENT_OTHER): Admit: 2021-11-21 | Payer: Medicare HMO | Admitting: Urology

## 2021-11-21 ENCOUNTER — Encounter (HOSPITAL_BASED_OUTPATIENT_CLINIC_OR_DEPARTMENT_OTHER): Payer: Self-pay

## 2021-11-21 SURGERY — TURP (TRANSURETHRAL RESECTION OF PROSTATE)
Anesthesia: General

## 2021-12-05 ENCOUNTER — Encounter: Payer: Self-pay | Admitting: Adult Health

## 2021-12-05 ENCOUNTER — Ambulatory Visit (INDEPENDENT_AMBULATORY_CARE_PROVIDER_SITE_OTHER): Payer: Medicare HMO | Admitting: Adult Health

## 2021-12-05 VITALS — BP 100/60 | HR 78 | Temp 99.0°F | Ht 72.0 in | Wt 174.0 lb

## 2021-12-05 DIAGNOSIS — Z0000 Encounter for general adult medical examination without abnormal findings: Secondary | ICD-10-CM | POA: Diagnosis not present

## 2021-12-05 DIAGNOSIS — E7800 Pure hypercholesterolemia, unspecified: Secondary | ICD-10-CM

## 2021-12-05 DIAGNOSIS — I10 Essential (primary) hypertension: Secondary | ICD-10-CM

## 2021-12-05 DIAGNOSIS — N400 Enlarged prostate without lower urinary tract symptoms: Secondary | ICD-10-CM | POA: Diagnosis not present

## 2021-12-05 DIAGNOSIS — Z7952 Long term (current) use of systemic steroids: Secondary | ICD-10-CM

## 2021-12-05 DIAGNOSIS — I739 Peripheral vascular disease, unspecified: Secondary | ICD-10-CM | POA: Diagnosis not present

## 2021-12-05 DIAGNOSIS — M353 Polymyalgia rheumatica: Secondary | ICD-10-CM

## 2021-12-05 LAB — URINALYSIS, ROUTINE W REFLEX MICROSCOPIC
Bilirubin Urine: NEGATIVE
Hgb urine dipstick: NEGATIVE
Ketones, ur: NEGATIVE
Nitrite: NEGATIVE
RBC / HPF: NONE SEEN (ref 0–?)
Specific Gravity, Urine: <=1.005 — AB (ref 1.000–1.030)
Total Protein, Urine: NEGATIVE
Urine Glucose: NEGATIVE
Urobilinogen, UA: 0.2 (ref 0.0–1.0)
pH: 5.5 (ref 5.0–8.0)

## 2021-12-05 LAB — CBC WITH DIFFERENTIAL/PLATELET
Basophils Absolute: 0.1 10*3/uL (ref 0.0–0.1)
Basophils Relative: 0.6 % (ref 0.0–3.0)
Eosinophils Absolute: 0.2 10*3/uL (ref 0.0–0.7)
Eosinophils Relative: 2.2 % (ref 0.0–5.0)
HCT: 43.3 % (ref 39.0–52.0)
Hemoglobin: 14 g/dL (ref 13.0–17.0)
Lymphocytes Relative: 17.3 % (ref 12.0–46.0)
Lymphs Abs: 1.5 10*3/uL (ref 0.7–4.0)
MCHC: 32.3 g/dL (ref 30.0–36.0)
MCV: 88.4 fl (ref 78.0–100.0)
Monocytes Absolute: 0.7 10*3/uL (ref 0.1–1.0)
Monocytes Relative: 7.4 % (ref 3.0–12.0)
Neutro Abs: 6.5 10*3/uL (ref 1.4–7.7)
Neutrophils Relative %: 72.5 % (ref 43.0–77.0)
Platelets: 242 10*3/uL (ref 150.0–400.0)
RBC: 4.89 Mil/uL (ref 4.22–5.81)
RDW: 15.1 % (ref 11.5–15.5)
WBC: 8.9 10*3/uL (ref 4.0–10.5)

## 2021-12-05 LAB — COMPREHENSIVE METABOLIC PANEL
ALT: 18 U/L (ref 0–53)
AST: 24 U/L (ref 0–37)
Albumin: 4.2 g/dL (ref 3.5–5.2)
Alkaline Phosphatase: 97 U/L (ref 39–117)
BUN: 15 mg/dL (ref 6–23)
CO2: 32 mEq/L (ref 19–32)
Calcium: 9.6 mg/dL (ref 8.4–10.5)
Chloride: 101 mEq/L (ref 96–112)
Creatinine, Ser: 0.85 mg/dL (ref 0.40–1.50)
GFR: 84.93 mL/min (ref >60.00)
Glucose, Bld: 89 mg/dL (ref 70–99)
Potassium: 4.1 mEq/L (ref 3.5–5.1)
Sodium: 138 mEq/L (ref 135–145)
Total Bilirubin: 0.6 mg/dL (ref 0.2–1.2)
Total Protein: 7.6 g/dL (ref 6.0–8.3)

## 2021-12-05 LAB — LIPID PANEL
Cholesterol: 181 mg/dL (ref 0–200)
HDL: 75.9 mg/dL (ref >39.00)
LDL Cholesterol: 96 mg/dL (ref 0–99)
NonHDL: 105.1
Total CHOL/HDL Ratio: 2
Triglycerides: 47 mg/dL (ref 0.0–149.0)
VLDL: 9.4 mg/dL (ref 0.0–40.0)

## 2021-12-05 LAB — PSA: PSA: 1.68 ng/mL (ref 0.10–4.00)

## 2021-12-05 LAB — TSH: TSH: 1.43 u[IU]/mL (ref 0.35–5.50)

## 2021-12-05 LAB — HEMOGLOBIN A1C: Hgb A1c MFr Bld: 6.1 % (ref 4.6–6.5)

## 2021-12-05 LAB — SEDIMENTATION RATE: Sed Rate: 31 mm/hr — ABNORMAL HIGH (ref 0–20)

## 2021-12-05 MED ORDER — ATORVASTATIN CALCIUM 80 MG PO TABS: 80.0000 mg | ORAL_TABLET | Freq: Every evening | ORAL | 3 refills | Status: DC

## 2021-12-05 MED ORDER — OLMESARTAN MEDOXOMIL 5 MG PO TABS: 5.0000 mg | ORAL_TABLET | Freq: Every day | ORAL | 3 refills | Status: DC

## 2021-12-05 MED ORDER — PREDNISONE 2.5 MG PO TABS: 2.5000 mg | ORAL_TABLET | Freq: Every day | ORAL | 3 refills | Status: DC

## 2021-12-05 MED ORDER — TAMSULOSIN HCL 0.4 MG PO CAPS: 0.8000 mg | ORAL_CAPSULE | Freq: Every day | ORAL | 0 refills | Status: DC

## 2021-12-05 NOTE — Progress Notes (Signed)
Subjective:    Patient ID: Brandon Dixon, male    DOB: [birth year], 76 y.o.   MRN: [id number]  HPI Patient presents for yearly preventative medicine examination. He is a pleasant 76 year old male who  has a past medical history of ALLERGIC RHINITIS (09/26/2007), BENIGN PROSTATIC HYPERTROPHY (05/27/2007), COLONIC POLYPS, HX OF (05/27/2007), DERMATITIS (08/25/2010), GERD (05/27/2007), HYPERCHOLESTEROLEMIA (09/26/2007), and Peripheral vascular disease ([place]).  Hyperlipidemia- Lipitor 80 mg.  He denies myalgia or fatigue. BPH uses Flomax 0.4 mg daily  PVD-  is followed by vascular surgery on yearly basis due to peripheral vascular disease in the left leg.  He denies any complaints today  Polymyalgia rheumatica-is currently prescribed prednisone 2.5 mg daily.  He did have a bone density screen done in June 2022 which was normal.Reports " feeling a little achy"   Hypertension-  is managed with Benicar 5 mg daily.  He denies dizziness, lightheadedness, chest pain, shortness of breath BP Readings from Last 3 Encounters:  12/05/21 100/60  11/05/21 128/80  09/19/21 119/69   BPH/LUTS/urinary retention-managed by urology.  He developed acute urinary retention in November 2022.  Foley cath was placed with 1.5 L of urine.  He was also treated at this time for concurrent UTI E. coli infection.  He failed his void trial in 09/2021.  Cystoscopy in January 2023 showed a relatively short prostatic urethra with moderately obstructing bilobar slight elevation of his bladder neck.  Significant trabueculation large bladder capacity.  He was offered TURP however refused and decided to continue with [facility] and satisfied with this current treatment. Currently taking Flomax 0.8 mg weekly.    All immunizations and health maintenance protocols were reviewed with the patient and needed orders were placed.  Appropriate screening laboratory values were ordered for the patient including screening of hyperlipidemia, renal function and  hepatic function. If indicated by BPH, a PSA was ordered.  Medication reconciliation,  past medical history, social history, problem list and allergies were reviewed in detail with the patient  Goals were established with regard to weight loss, exercise, and  diet in compliance with medications.  Stays very active and tries to eat a heart healthy diet Wt Readings from Last 3 Encounters:  12/05/21 174 lb (78.9 kg)  11/05/21 177 lb (80.3 kg)  07/21/21 171 lb 4 oz (77.7 kg)   Review of Systems  Constitutional: Negative.   HENT: Negative.    Eyes: Negative.   Respiratory: Negative.    Cardiovascular: Negative.   Gastrointestinal: Negative.   Endocrine: Negative.   Genitourinary: Negative.   Musculoskeletal: Negative.   Skin: Negative.   Allergic/Immunologic: Negative.   Neurological: Negative.   Hematological: Negative.   Psychiatric/Behavioral: Negative.    All other systems reviewed and are negative.   Past Medical History:  Diagnosis Date   ALLERGIC RHINITIS 09/26/2007   BENIGN PROSTATIC HYPERTROPHY 05/27/2007   COLONIC POLYPS, HX OF 05/27/2007   DERMATITIS 08/25/2010   GERD 05/27/2007   HYPERCHOLESTEROLEMIA 09/26/2007   Peripheral vascular disease ([place])     Social History   Socioeconomic History   Marital status: Married    Spouse name: Not on file   Number of children: Not on file   Years of education: Not on file   Highest education level: Not on file  Occupational History   Not on file  Tobacco Use   Smoking status: Former    Types: Cigarettes    Quit date: 10/19/2001    Years since quitting: 20.1   Smokeless tobacco:  Never  Vaping Use   Vaping Use: Never used  Substance and Sexual Activity   Alcohol use: Yes    Alcohol/week: 6.0 standard drinks    Types: 6 Cans of beer per week   Drug use: No   Sexual activity: Not on file  Other Topics Concern   Not on file  Social History Narrative   Works as a Physicist, medical    Married    Three daughters    Social  Determinants of Radio broadcast assistant Strain: Not on file  Food Insecurity: Not on file  Transportation Needs: Not on file  Physical Activity: Not on file  Stress: Not on file  Social Connections: Not on file  Intimate Partner Violence: Not on file    Past Surgical History:  Procedure Laterality Date   LOWER EXTREMITY ANGIOGRAM Left 04/30/2014   Procedure: LOWER EXTREMITY ANGIOGRAM;  Surgeon: Angelia Mould, MD;  Location: [facility] CATH LAB;  Service: Cardiovascular;  Laterality: Left;   ROTATOR CUFF REPAIR     VASECTOMY      Family History  Problem Relation Age of Onset   Heart disease Mother    Heart attack Mother        62    Cancer Father        lung ca   Hypertension Sister    Diabetes Brother    Hypertension Sister    Diabetes Brother    Diabetes Brother    Diabetes Brother    Diabetes Brother    Diabetes Daughter     No Known Allergies  Current Outpatient Medications on File Prior to Visit  Medication Sig Dispense Refill   aspirin EC 81 MG tablet Take 81 mg by mouth daily.     calcium carbonate (OS-CAL) 600 MG TABS tablet Take 600 mg by mouth daily with breakfast.     cephALEXin (KEFLEX) 500 MG capsule Take 1 capsule (500 mg total) by mouth 2 (two) times daily. 20 capsule 0   fluticasone (FLONASE) 50 MCG/ACT nasal spray PLACE 2 SPRAYS INTO BOTH NOSTRILS DAILY. (Patient taking differently: 2 sprays daily as needed for allergies.) 48 g 4   hydrocortisone cream 1 % Apply 1 application topically daily as needed for itching. For legs     hydrocortisone ointment 0.5 % Apply 1 application topically daily as needed for itching.     No current facility-administered medications on file prior to visit.    BP 100/60    Pulse 78    Temp 99 F (37.2 C) (Oral)    Ht 6' (1.829 m)    Wt 174 lb (78.9 kg)    SpO2 96%    BMI 23.60 kg/m       Objective:   Physical Exam Vitals and nursing note reviewed.  Constitutional:      General: He is not in acute distress.     Appearance: Normal appearance. He is well-developed and normal weight.  HENT:     Head: Normocephalic and atraumatic.     Right Ear: Tympanic membrane, ear canal and external ear normal. There is no impacted cerumen.     Left Ear: Tympanic membrane, ear canal and external ear normal. There is no impacted cerumen.     Nose: Nose normal. No congestion or rhinorrhea.     Mouth/Throat:     Mouth: Mucous membranes are moist.     Pharynx: Oropharynx is clear. No oropharyngeal exudate or posterior oropharyngeal erythema.  Eyes:     General:  Right eye: No discharge.        Left eye: No discharge.     Extraocular Movements: Extraocular movements intact.     Conjunctiva/sclera: Conjunctivae normal.     Pupils: Pupils are equal, round, and reactive to light.  Neck:     Vascular: No carotid bruit.     Trachea: No tracheal deviation.  Cardiovascular:     Rate and Rhythm: Normal rate and regular rhythm.     Pulses: Normal pulses.     Heart sounds: Normal heart sounds. No murmur heard.   No friction rub. No gallop.  Pulmonary:     Effort: Pulmonary effort is normal. No respiratory distress.     Breath sounds: Normal breath sounds. No stridor. No wheezing, rhonchi or rales.  Chest:     Chest wall: No tenderness.  Abdominal:     General: Bowel sounds are normal. There is no distension.     Palpations: Abdomen is soft. There is no mass.     Tenderness: There is no abdominal tenderness. There is no right CVA tenderness, left CVA tenderness, guarding or rebound.     Hernia: No hernia is present.  Musculoskeletal:        General: No swelling, tenderness, deformity or signs of injury. Normal range of motion.     Right lower leg: No edema.     Left lower leg: No edema.  Lymphadenopathy:     Cervical: No cervical adenopathy.  Skin:    General: Skin is warm and dry.     Capillary Refill: Capillary refill takes less than 2 seconds.     Coloration: Skin is not jaundiced or pale.     Findings:  No bruising, erythema, lesion or rash.  Neurological:     General: No focal deficit present.     Mental Status: He is alert and oriented to person, place, and time.     Cranial Nerves: No cranial nerve deficit.     Sensory: No sensory deficit.     Motor: No weakness.     Coordination: Coordination normal.     Gait: Gait normal.     Deep Tendon Reflexes: Reflexes normal.  Psychiatric:        Mood and Affect: Mood normal.        Behavior: Behavior normal.        Thought Content: Thought content normal.        Judgment: Judgment normal.      Assessment & Plan:  1. Routine general medical examination at a health care facility - Follow up in one year or sooner if needed - Continue to eat healthy and exercise  - CBC with Differential/Platelet; Future - Comprehensive metabolic panel; Future - Hemoglobin A1c; Future - Lipid panel; Future - TSH; Future  2. PVD (peripheral vascular disease) ([place]) - Follow up with vascular surgery as directed - CBC with Differential/Platelet; Future - Comprehensive metabolic panel; Future - Hemoglobin A1c; Future - Lipid panel; Future - TSH; Future  3. Essential hypertension - Well controlled.  - CBC with Differential/Platelet; Future - Comprehensive metabolic panel; Future - Hemoglobin A1c; Future - Lipid panel; Future - TSH; Future - olmesartan (BENICAR) 5 MG tablet; Take 1 tablet (5 mg total) by mouth daily.  Dispense: 90 tablet; Refill: 3  4. Current chronic use of systemic steroids - Continue with Vitamin D and Calcium  - CBC with Differential/Platelet; Future - Comprehensive metabolic panel; Future - Hemoglobin A1c; Future - Lipid panel; Future - TSH; Future  5. Polymyalgia rheumatica ([facility]) - Consider increase prednisone to 5 mg for short time  - CBC with Differential/Platelet; Future - Comprehensive metabolic panel; Future - Hemoglobin A1c; Future - Lipid panel; Future - TSH; Future - predniSONE (DELTASONE) 2.5 MG tablet; Take 1  tablet (2.5 mg total) by mouth daily with breakfast.  Dispense: 90 tablet; Refill: 3 - Sedimentation Rate; Future  6. Benign prostatic hyperplasia without lower urinary tract symptoms - Follow up with urology as directed - PSA; Future - tamsulosin (FLOMAX) 0.4 MG CAPS capsule; Take 2 capsules (0.8 mg total) by mouth daily after supper. TAKE 2 CAPSULE  DAILY AFTER SUPPER.  Dispense: 180 capsule; Refill: 0 - Urinalysis; Future  7. HYPERCHOLESTEROLEMIA  - atorvastatin (LIPITOR) 80 MG tablet; Take 1 tablet (80 mg total) by mouth every evening.  Dispense: 90 tablet; Refill: 3  Dorothyann Peng, NP

## 2021-12-05 NOTE — Patient Instructions (Signed)
It was great seeing you today   We will follow up with you regarding your lab work   Please let me know if you need anything   

## 2021-12-31 ENCOUNTER — Encounter: Payer: Self-pay | Admitting: Adult Health

## 2022-01-21 ENCOUNTER — Ambulatory Visit (INDEPENDENT_AMBULATORY_CARE_PROVIDER_SITE_OTHER): Payer: Medicare HMO

## 2022-01-21 ENCOUNTER — Encounter: Payer: Self-pay | Admitting: Adult Health

## 2022-01-21 ENCOUNTER — Ambulatory Visit (INDEPENDENT_AMBULATORY_CARE_PROVIDER_SITE_OTHER): Payer: Medicare HMO | Admitting: Adult Health

## 2022-01-21 VITALS — BP 122/78 | HR 82 | Temp 97.9°F | Ht 72.0 in | Wt 174.0 lb

## 2022-01-21 DIAGNOSIS — M545 Low back pain, unspecified: Secondary | ICD-10-CM

## 2022-01-21 DIAGNOSIS — M25551 Pain in right hip: Secondary | ICD-10-CM | POA: Diagnosis not present

## 2022-01-21 DIAGNOSIS — M353 Polymyalgia rheumatica: Secondary | ICD-10-CM

## 2022-01-21 DIAGNOSIS — G8929 Other chronic pain: Secondary | ICD-10-CM | POA: Diagnosis not present

## 2022-01-21 DIAGNOSIS — M5136 Other intervertebral disc degeneration, lumbar region: Secondary | ICD-10-CM | POA: Diagnosis not present

## 2022-01-21 DIAGNOSIS — M1611 Unilateral primary osteoarthritis, right hip: Secondary | ICD-10-CM | POA: Diagnosis not present

## 2022-01-21 DIAGNOSIS — M5137 Other intervertebral disc degeneration, lumbosacral region: Secondary | ICD-10-CM | POA: Diagnosis not present

## 2022-01-21 DIAGNOSIS — M47816 Spondylosis without myelopathy or radiculopathy, lumbar region: Secondary | ICD-10-CM | POA: Diagnosis not present

## 2022-01-21 LAB — SEDIMENTATION RATE: Sed Rate: 39 mm/hr — ABNORMAL HIGH (ref 0–20)

## 2022-01-21 IMAGING — DX DG LUMBAR SPINE COMPLETE 4+V
5 series · 5 of 5 positions shown · non-contrast
Comparison: None.

CLINICAL DATA: Chronic low back pain.

EXAM:
LUMBAR SPINE - COMPLETE 4+ VIEW

[lumbar spine ap]
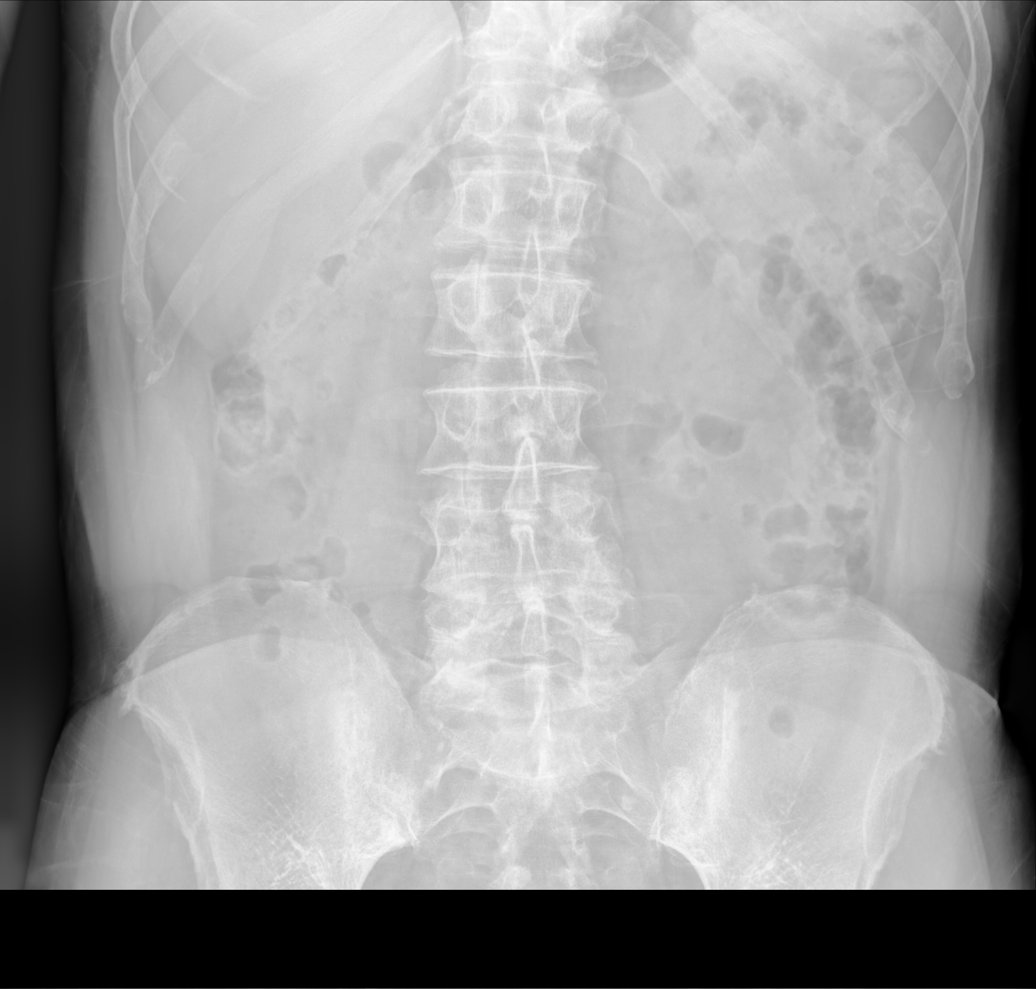

[lumbar spine oblique (1 of 2)]
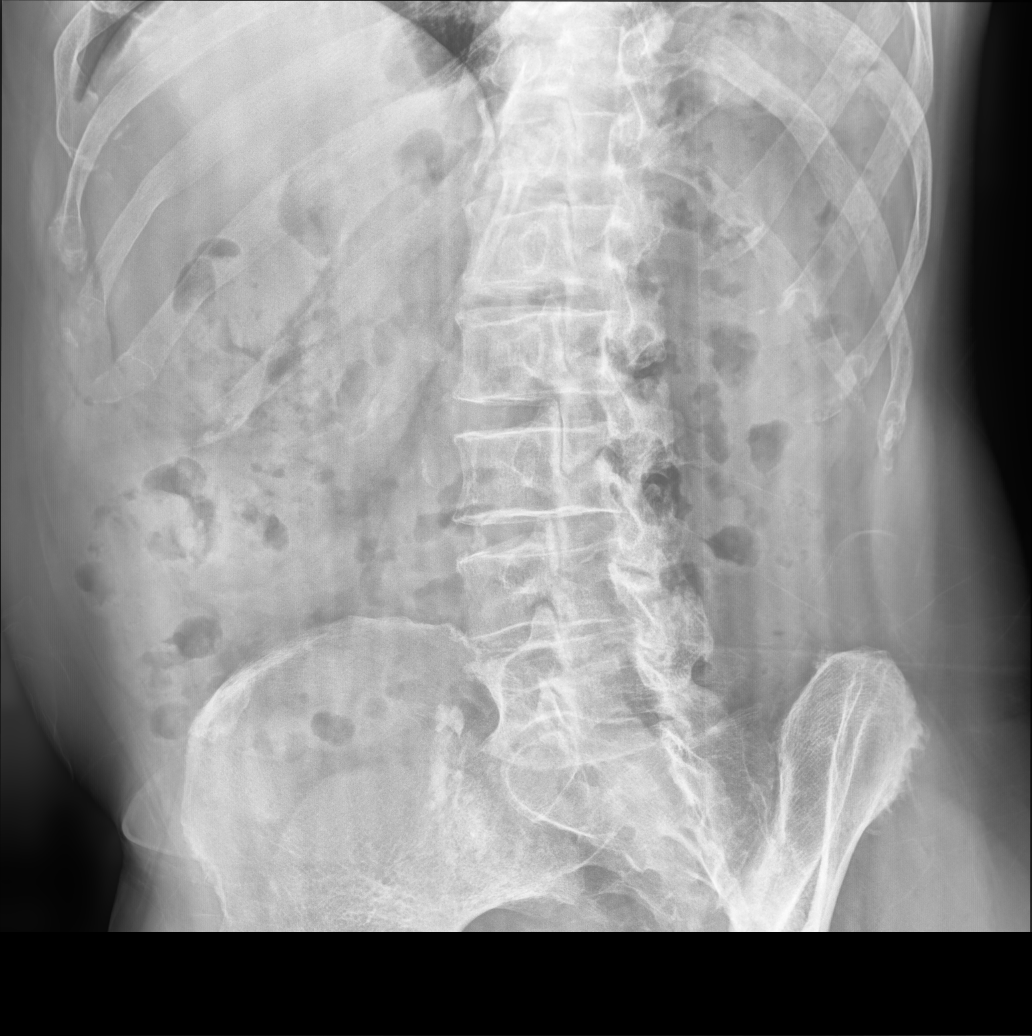

[lumbar spine oblique (2 of 2)]
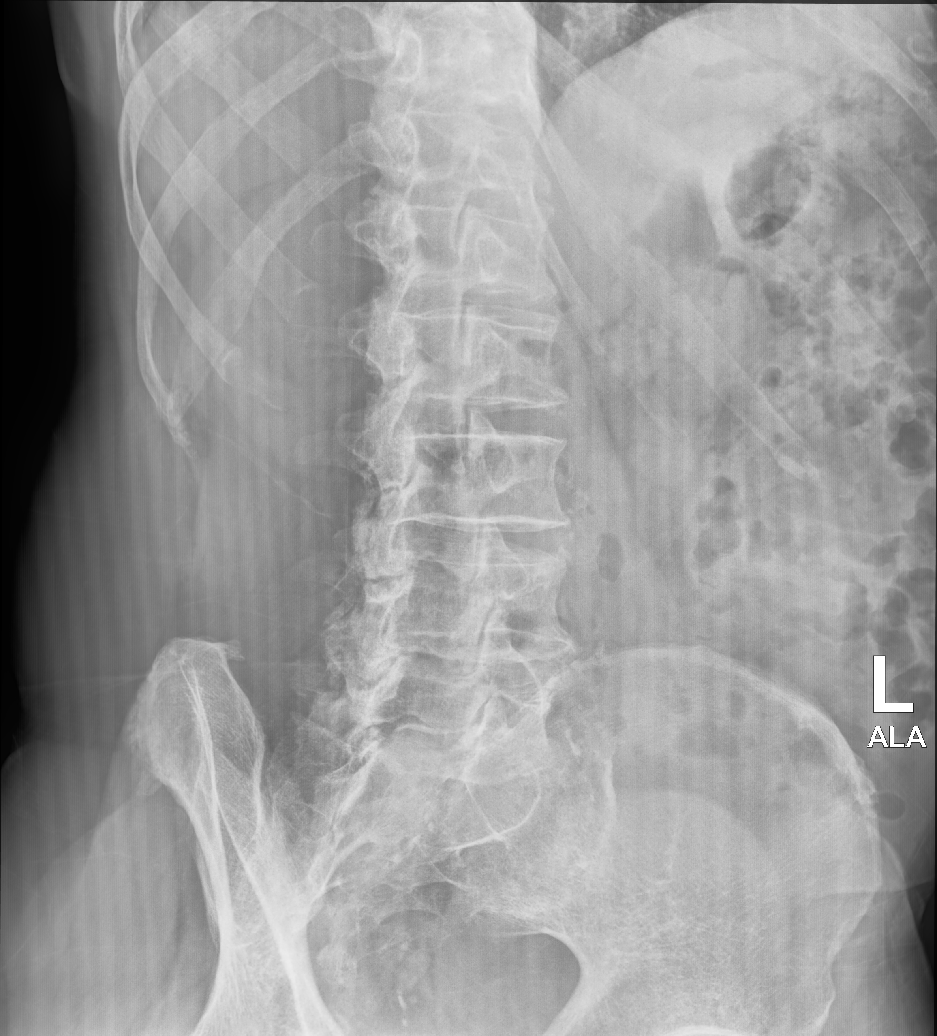

[lumbar spine lat (1 of 2)]
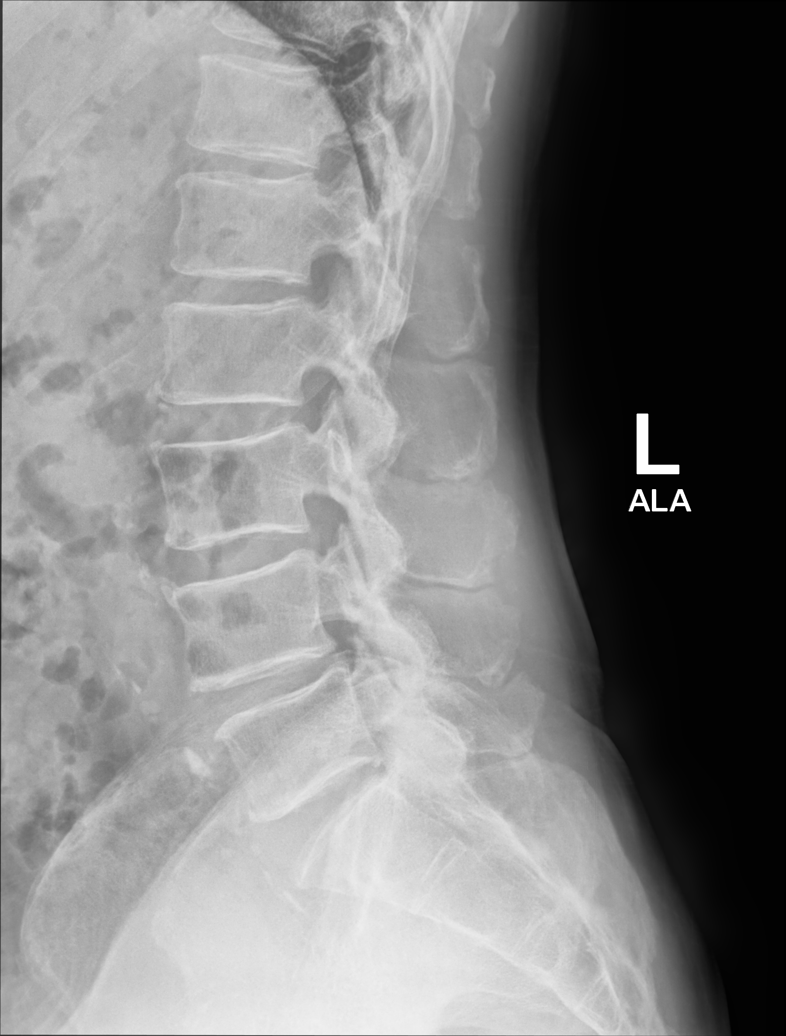

[lumbar spine lat (2 of 2)]
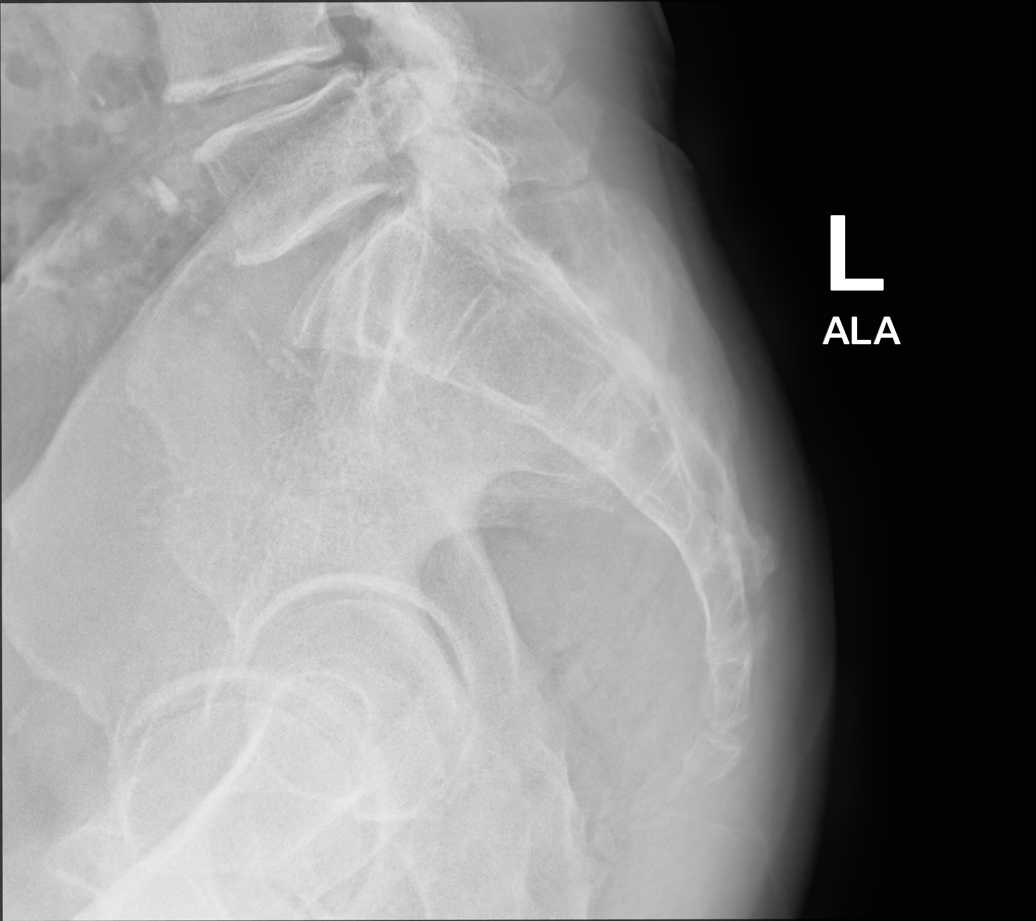

[5 of 5 positions shown; findings below may reference images not displayed]

FINDINGS: Normal anatomic alignment. L4-5 and L5-S1 degenerative disc disease.
Lower lumbar spine facet degenerative changes. Preservation of the
vertebral body heights. SI joints are unremarkable. Vascular
calcifications.
IMPRESSION: Lower lumbar spine degenerative changes.

## 2022-01-21 IMAGING — DX DG HIP (WITH OR WITHOUT PELVIS) 2-3V*R*
3 series · 3 of 3 positions shown · non-contrast
Comparison: None.

CLINICAL DATA: Back pain radiating to the right hip and groin.

EXAM:
DG HIP (WITH OR WITHOUT PELVIS) 2-3V RIGHT

[pelvis ap]
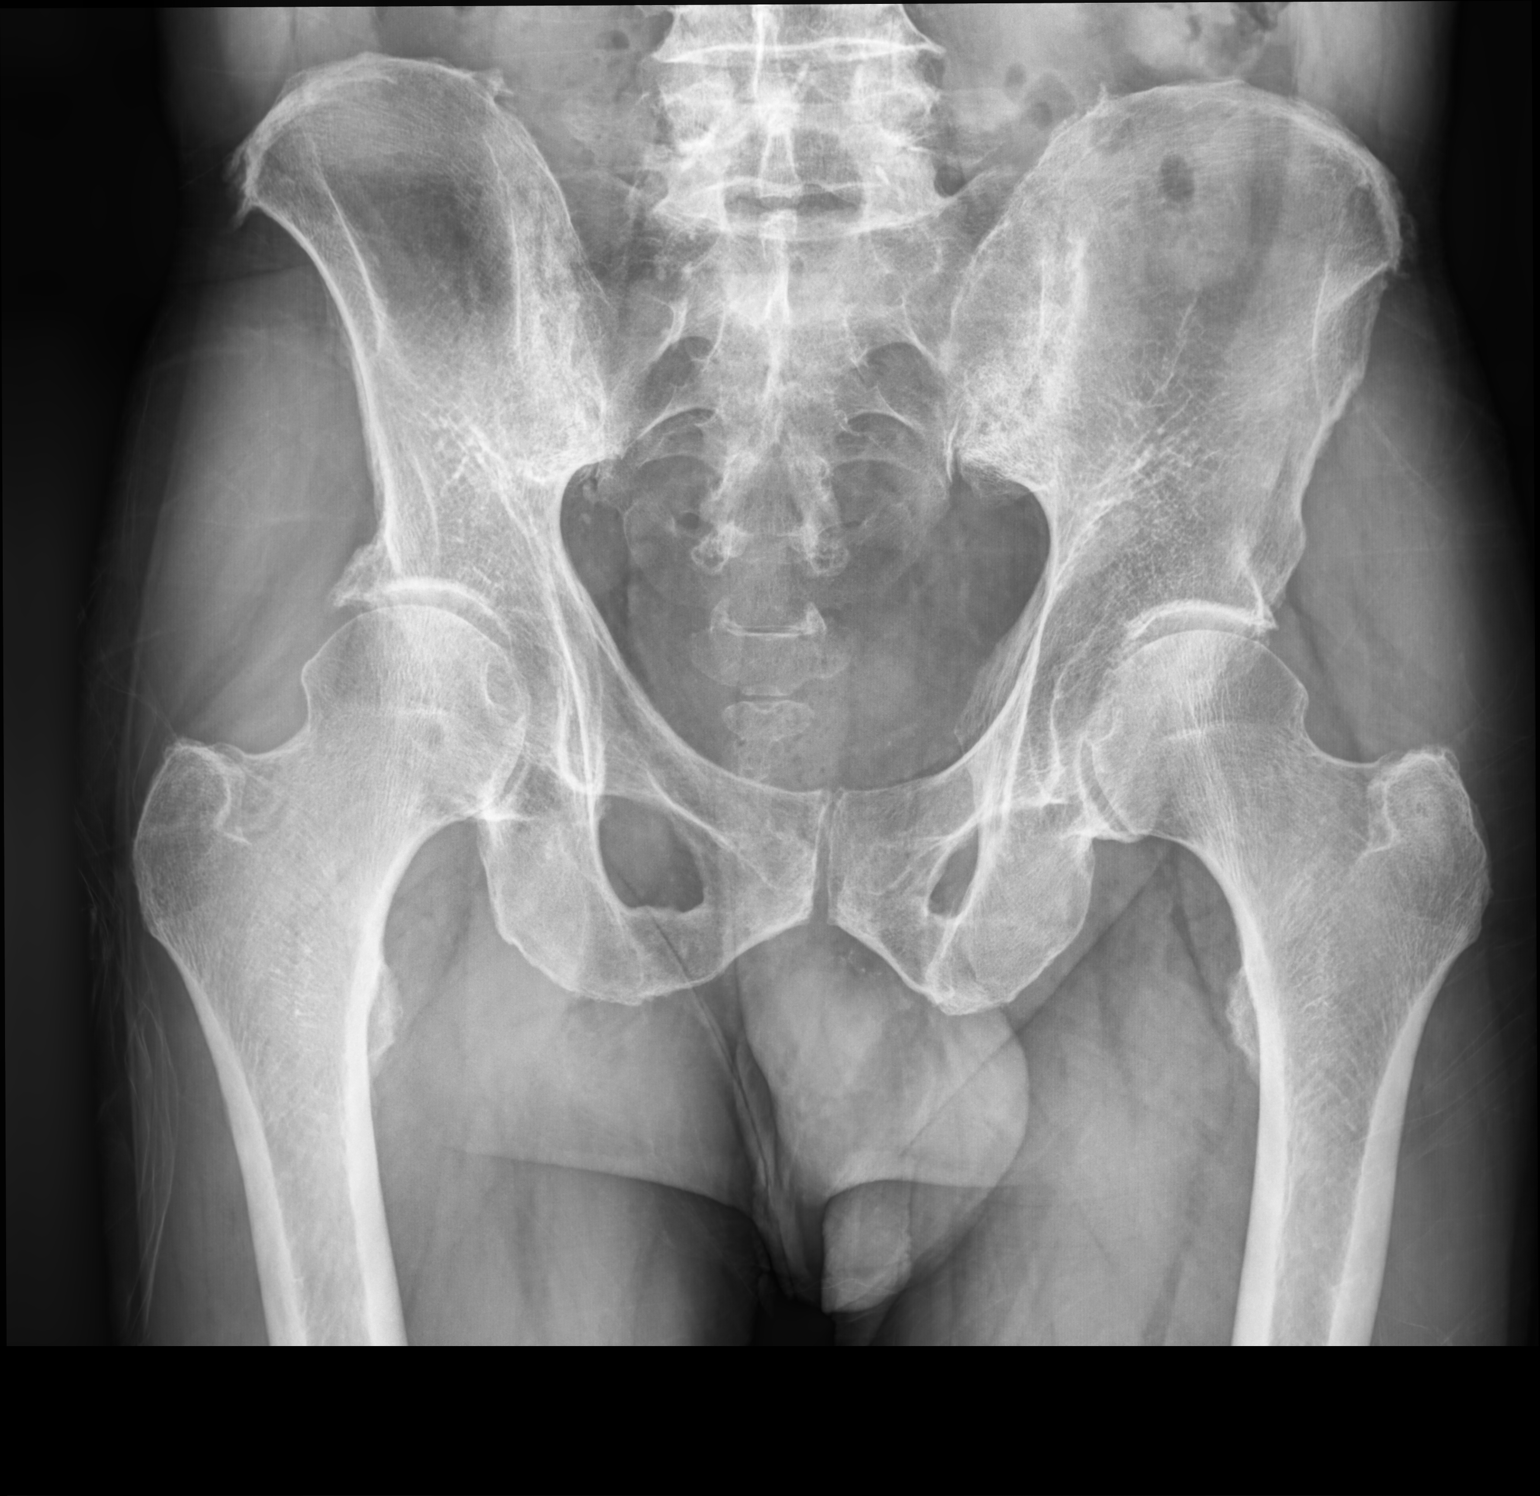

[hip joint ap]
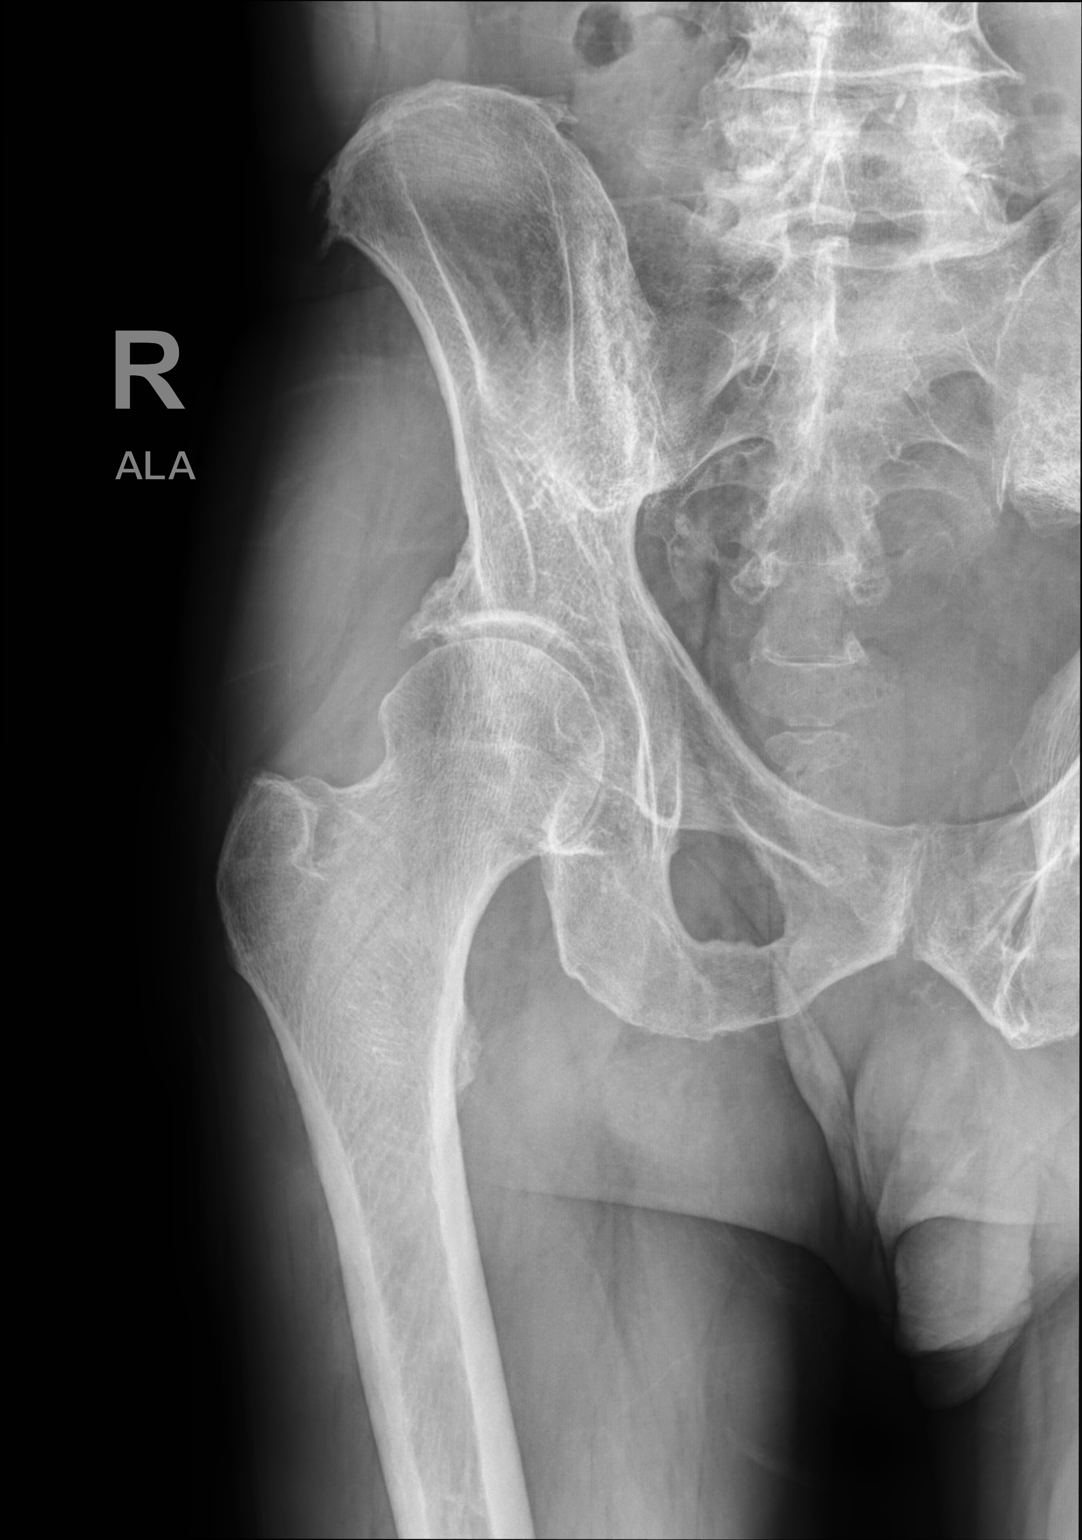

[hip (frog leg) lat]
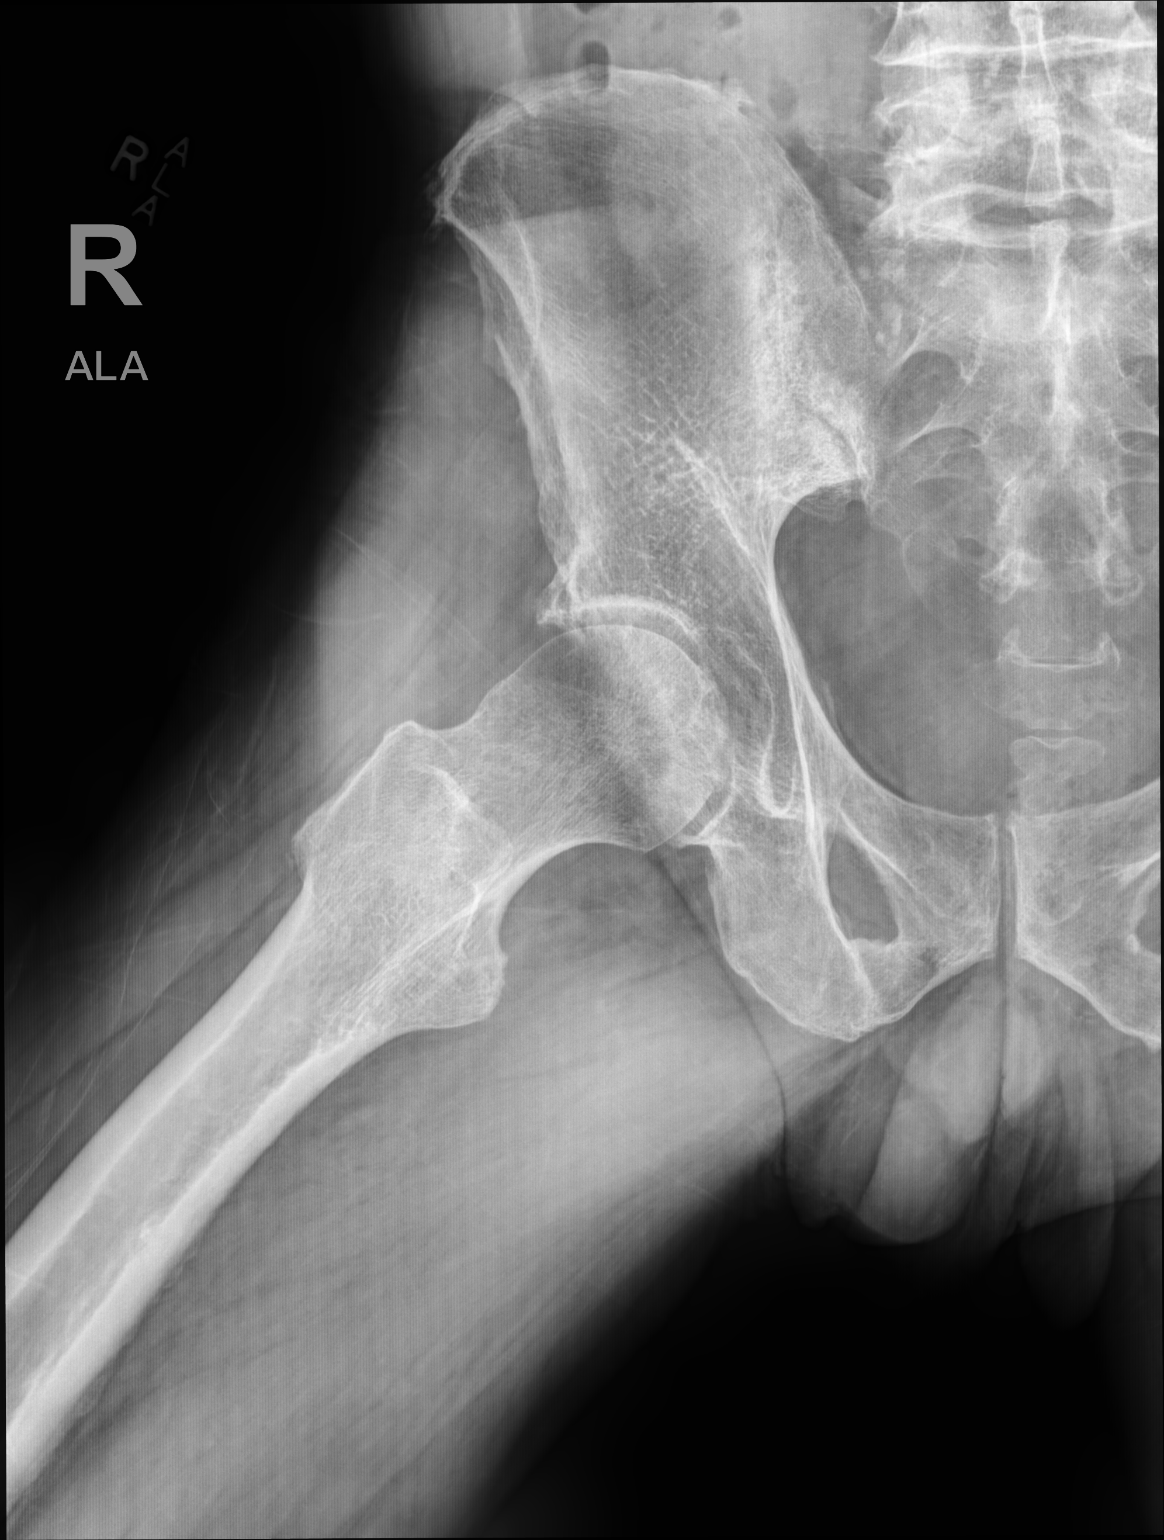

[3 of 3 positions shown; findings below may reference images not displayed]

FINDINGS: Degenerative changes in the lumbar spine. Degenerative changes in
the hips with narrowing of the acetabular joint, sclerosis, and
osteophyte formation. Degenerative changes are slightly more
prominent on the right. Sclerosis in the SI joints is likely
degenerative. No evidence of acute fracture or dislocation. No focal
bone lesion or bone destruction. Cortex appears intact. Soft tissues
are unremarkable.
IMPRESSION: Degenerative changes in the right hip. No acute displaced fractures
identified.

## 2022-01-21 NOTE — Progress Notes (Signed)
? ?Subjective:  ? ? Patient ID: Brandon Dixon, male    DOB: 1945/11/21, 76 y.o.   MRN: [id number] ? ?HPI ?76 year old male who  has a past medical history of ALLERGIC RHINITIS (09/26/2007), BENIGN PROSTATIC HYPERTROPHY (05/27/2007), COLONIC POLYPS, HX OF (05/27/2007), DERMATITIS (08/25/2010), GERD (05/27/2007), HYPERCHOLESTEROLEMIA (09/26/2007), and Peripheral vascular disease ([place]). ? ?He presents to the office today for low back pain that radiates into the right hip and groin. His symptoms have been present for the last 3-4 months.  ? ?He was last evaluated for these issues about 3 months ago. At this time his low back pain had been present for a few days.We thought it was more muscular in nature but he was also experiencing some urinary retention and was self cathing himself so we stayed away from [facility] relaxer.  ? ?Today he reports that he continues to have low back pain that radiates into the right hip and groin that is worse with weight baring such as walking. No radiating pain down his right leg. Pain is worse at time depending on how much activity he does. He will find himself limping from time to time  ? ?Last MRI of lumbar spine in 11/2019 showed  ? ?Moderate-sized right paracentral disc extrusion at L4-5 with mass ?effect on the thecal sac and on the right L5 nerve root in the ?lateral recess. ? ?He would also like to check his Sed rate due to history of Polymyalgia Rheumatica for which he is on daily prednisone therapy.  ? ?Review of Systems ?See HPI  ? ?Past Medical History:  ?Diagnosis Date  ? ALLERGIC RHINITIS 09/26/2007  ? BENIGN PROSTATIC HYPERTROPHY 05/27/2007  ? COLONIC POLYPS, HX OF 05/27/2007  ? DERMATITIS 08/25/2010  ? GERD 05/27/2007  ? HYPERCHOLESTEROLEMIA 09/26/2007  ? Peripheral vascular disease ([place])   ? ? ?Social History  ? ?Socioeconomic History  ? Marital status: Married  ?  Spouse name: Not on file  ? Number of children: Not on file  ? Years of education: Not on file  ? Highest education level: Not on file   ?Occupational History  ? Not on file  ?Tobacco Use  ? Smoking status: Former  ?  Types: Cigarettes  ?  Quit date: 10/19/2001  ?  Years since quitting: 20.2  ? Smokeless tobacco: Never  ?Vaping Use  ? Vaping Use: Never used  ?Substance and Sexual Activity  ? Alcohol use: Yes  ?  Alcohol/week: 6.0 standard drinks  ?  Types: 6 Cans of beer per week  ? Drug use: No  ? Sexual activity: Not on file  ?Other Topics Concern  ? Not on file  ?Social History Narrative  ? Works as a Physicist, medical   ? Married   ? Three daughters   ? ?Social Determinants of Health  ? ?Financial Resource Strain: Not on file  ?Food Insecurity: Not on file  ?Transportation Needs: Not on file  ?Physical Activity: Not on file  ?Stress: Not on file  ?Social Connections: Not on file  ?Intimate Partner Violence: Not on file  ? ? ?Past Surgical History:  ?Procedure Laterality Date  ? LOWER EXTREMITY ANGIOGRAM Left 04/30/2014  ? Procedure: LOWER EXTREMITY ANGIOGRAM;  Surgeon: Angelia Mould, MD;  Location[facility] CATH LAB;  Service: Cardiovascular;  Laterality: Left;  ? ROTATOR CUFF REPAIR    ? VASECTOMY    ? ? ?Family History  ?Problem Relation Age of Onset  ? Heart disease Mother   ? Heart attack  Mother   ?     97   ? Cancer Father   ?     lung ca  ? Hypertension Sister   ? Diabetes Brother   ? Hypertension Sister   ? Diabetes Brother   ? Diabetes Brother   ? Diabetes Brother   ? Diabetes Brother   ? Diabetes Daughter   ? ? ?No Known Allergies ? ?Current Outpatient Medications on File Prior to Visit  ?Medication Sig Dispense Refill  ? aspirin EC 81 MG tablet Take 81 mg by mouth daily.    ? atorvastatin (LIPITOR) 80 MG tablet Take 1 tablet (80 mg total) by mouth every evening. 90 tablet 3  ? calcium carbonate (OS-CAL) 600 MG TABS tablet Take 600 mg by mouth daily with breakfast.    ? cephALEXin (KEFLEX) 500 MG capsule Take 1 capsule (500 mg total) by mouth 2 (two) times daily. 20 capsule 0  ? fluticasone (FLONASE) 50 MCG/ACT nasal spray PLACE 2 SPRAYS  INTO BOTH NOSTRILS DAILY. (Patient taking differently: 2 sprays daily as needed for allergies.) 48 g 4  ? hydrocortisone cream 1 % Apply 1 application topically daily as needed for itching. For legs    ? hydrocortisone ointment 0.5 % Apply 1 application topically daily as needed for itching.    ? olmesartan (BENICAR) 5 MG tablet Take 1 tablet (5 mg total) by mouth daily. 90 tablet 3  ? predniSONE (DELTASONE) 2.5 MG tablet Take 1 tablet (2.5 mg total) by mouth daily with breakfast. 90 tablet 3  ? tamsulosin (FLOMAX) 0.4 MG CAPS capsule Take 2 capsules (0.8 mg total) by mouth daily after supper. TAKE 2 CAPSULE  DAILY AFTER SUPPER. 180 capsule 0  ? ?No current facility-administered medications on file prior to visit.  ? ? ?BP 122/78   Pulse 82   Temp 97.9 ?F (36.6 ?C) (Oral)   Ht 6' (1.829 m)   Wt 174 lb (78.9 kg)   SpO2 97%   BMI 23.60 kg/m?  ? ? ?   ?Objective:  ? Physical Exam ?Vitals and nursing note reviewed.  ?Constitutional:   ?   Appearance: Normal appearance.  ?Cardiovascular:  ?   Rate and Rhythm: Normal rate and regular rhythm.  ?Musculoskeletal:     ?   General: No swelling or tenderness.  ?   Right hip: No bony tenderness or crepitus. Decreased range of motion.  ?Skin: ?   General: Skin is warm and dry.  ?Neurological:  ?   Mental Status: He is alert.  ? ?   ?Assessment & Plan:  ?1. Chronic right-sided low back pain without sciatica ?- Consider repeat MRI of low back  ?- DG Lumbar Spine Complete; Future ? ?2. Right hip pain ?- Consider MRI of hip and referral to ortho ?- DG Hip Unilat W OR W/O Pelvis 2-3 Views Right; Future ? ?3. Polymyalgia rheumatica ([place]) ? ?- Sedimentation Rate; Future ?- Sedimentation Rate ? ?Dorothyann Peng, NP ? ?

## 2022-01-22 ENCOUNTER — Other Ambulatory Visit: Payer: Self-pay

## 2022-01-22 DIAGNOSIS — M47816 Spondylosis without myelopathy or radiculopathy, lumbar region: Secondary | ICD-10-CM

## 2022-01-27 DIAGNOSIS — R338 Other retention of urine: Secondary | ICD-10-CM | POA: Diagnosis not present

## 2022-01-27 DIAGNOSIS — N401 Enlarged prostate with lower urinary tract symptoms: Secondary | ICD-10-CM | POA: Diagnosis not present

## 2022-01-27 DIAGNOSIS — N500 Atrophy of testis: Secondary | ICD-10-CM | POA: Diagnosis not present

## 2022-01-27 DIAGNOSIS — N62 Hypertrophy of breast: Secondary | ICD-10-CM | POA: Diagnosis not present

## 2022-01-28 DIAGNOSIS — N500 Atrophy of testis: Secondary | ICD-10-CM | POA: Diagnosis not present

## 2022-01-28 DIAGNOSIS — N401 Enlarged prostate with lower urinary tract symptoms: Secondary | ICD-10-CM | POA: Diagnosis not present

## 2022-01-28 DIAGNOSIS — R338 Other retention of urine: Secondary | ICD-10-CM | POA: Diagnosis not present

## 2022-01-28 DIAGNOSIS — N62 Hypertrophy of breast: Secondary | ICD-10-CM | POA: Diagnosis not present

## 2022-02-03 DIAGNOSIS — Z79899 Other long term (current) drug therapy: Secondary | ICD-10-CM | POA: Diagnosis not present

## 2022-02-03 DIAGNOSIS — R3914 Feeling of incomplete bladder emptying: Secondary | ICD-10-CM | POA: Diagnosis not present

## 2022-02-03 DIAGNOSIS — N401 Enlarged prostate with lower urinary tract symptoms: Secondary | ICD-10-CM | POA: Diagnosis not present

## 2022-02-07 ENCOUNTER — Encounter: Payer: Self-pay | Admitting: Adult Health

## 2022-02-09 ENCOUNTER — Other Ambulatory Visit: Payer: Self-pay

## 2022-02-09 DIAGNOSIS — N62 Hypertrophy of breast: Secondary | ICD-10-CM | POA: Diagnosis not present

## 2022-02-09 DIAGNOSIS — I70211 Atherosclerosis of native arteries of extremities with intermittent claudication, right leg: Secondary | ICD-10-CM

## 2022-02-09 DIAGNOSIS — N401 Enlarged prostate with lower urinary tract symptoms: Secondary | ICD-10-CM | POA: Diagnosis not present

## 2022-02-09 DIAGNOSIS — E221 Hyperprolactinemia: Secondary | ICD-10-CM | POA: Diagnosis not present

## 2022-02-09 DIAGNOSIS — N319 Neuromuscular dysfunction of bladder, unspecified: Secondary | ICD-10-CM | POA: Diagnosis not present

## 2022-02-09 NOTE — Telephone Encounter (Signed)
Xrays of hip & lumbar on 01/21/22 show:  ?"Degenerative changes in the right hip. No acute displaced fractures ?identified." & "Lower lumbar spine degenerative changes." ? ?Will send to PCP for clarification if right hip pain needs to be added to referral. ?

## 2022-02-12 ENCOUNTER — Other Ambulatory Visit: Payer: Self-pay | Admitting: Urology

## 2022-02-12 DIAGNOSIS — R891 Abnormal level of hormones in specimens from other organs, systems and tissues: Secondary | ICD-10-CM

## 2022-02-12 DIAGNOSIS — E221 Hyperprolactinemia: Secondary | ICD-10-CM

## 2022-02-12 DIAGNOSIS — N62 Hypertrophy of breast: Secondary | ICD-10-CM

## 2022-02-19 ENCOUNTER — Encounter: Payer: Self-pay | Admitting: Vascular Surgery

## 2022-02-19 ENCOUNTER — Ambulatory Visit (INDEPENDENT_AMBULATORY_CARE_PROVIDER_SITE_OTHER)
Admission: RE | Admit: 2022-02-19 | Discharge: 2022-02-19 | Disposition: A | Discharge status: Home / Self Care | Payer: Medicare HMO | Source: Ambulatory Visit | Attending: Vascular Surgery | Admitting: Vascular Surgery

## 2022-02-19 ENCOUNTER — Ambulatory Visit: Payer: Medicare HMO | Admitting: Vascular Surgery

## 2022-02-19 ENCOUNTER — Ambulatory Visit (HOSPITAL_COMMUNITY)
Admission: RE | Admit: 2022-02-19 | Discharge: 2022-02-19 | Disposition: A | Discharge status: Home / Self Care | Payer: Medicare HMO | Source: Ambulatory Visit | Attending: Vascular Surgery | Admitting: Vascular Surgery

## 2022-02-19 VITALS — BP 124/64 | HR 74 | Temp 98.0°F | Resp 20 | Ht 72.0 in | Wt 176.9 lb

## 2022-02-19 DIAGNOSIS — I70211 Atherosclerosis of native arteries of extremities with intermittent claudication, right leg: Secondary | ICD-10-CM

## 2022-02-19 NOTE — Progress Notes (Signed)
? ? ?REASON FOR VISIT:  ? ?Follow-up of peripheral arterial disease ? ?MEDICAL ISSUES:  ? ?PERIPHERAL ARTERIAL DISEASE: This patient has known severe infrainguinal arterial occlusive disease.  His ABIs and symptoms are stable.  He can walk 1/8 of a mile before experiencing symptoms.  He has no rest pain.  For this reason I think we can continue to follow this.  Based on his vein map today if his symptoms progressed his only option would be an endovascular approach given that he would not do well with a prosthetic bypass down to the tibials.  Fortunately he is quite motivated and will stay with his walking program.  He is limited somewhat by some back issues and hip issues on the right.  He is not a smoker.  He is on aspirin and is on a statin.  I have ordered follow-up ABIs in 1 year and I will see him back at that time.  He knows to call sooner if he has problems. ? ?HPI:  ? ?Brandon Dixon is a pleasant 76 y.o. male who I last saw on 02/19/2021.  I have been following him with peripheral vascular disease.  He has known severe infrainguinal arterial occlusive disease with stable claudication of the left lower extremity.  Based on an arteriogram back in 2015 he will require tibial bypass if he underwent revascularization.  He was highly motivated and walked daily.  He comes in for a 1 year follow-up visit. ? ?Since I saw him last he has stable claudication of the left calf.  He has no calf claudication on the right.  He can walk in a thimble mile before experiencing symptoms which is exactly how far he can walk a year ago.  This is symptoms are stable.  He denies any history of rest pain.  He denies any history of nonhealing wounds.  He does have some back pain and hip pain.  He is also had some issues with urinary retention is being worked up for this. ? ?He is not a smoker. ? ?Past Medical History:  ?Diagnosis Date  ? ALLERGIC RHINITIS 09/26/2007  ? BENIGN PROSTATIC HYPERTROPHY 05/27/2007  ? COLONIC POLYPS, HX OF  05/27/2007  ? DERMATITIS 08/25/2010  ? GERD 05/27/2007  ? HYPERCHOLESTEROLEMIA 09/26/2007  ? Peripheral vascular disease ([place])   ? ? ?Family History  ?Problem Relation Age of Onset  ? Heart disease Mother   ? Heart attack Mother   ?     79   ? Cancer Father   ?     lung ca  ? Hypertension Sister   ? Diabetes Brother   ? Hypertension Sister   ? Diabetes Brother   ? Diabetes Brother   ? Diabetes Brother   ? Diabetes Brother   ? Diabetes Daughter   ? ? ?SOCIAL HISTORY: ?Social History  ? ?Tobacco Use  ? Smoking status: Former  ?  Types: Cigarettes  ?  Quit date: 10/19/2001  ?  Years since quitting: 20.3  ? Smokeless tobacco: Never  ?Substance Use Topics  ? Alcohol use: Yes  ?  Alcohol/week: 6.0 standard drinks  ?  Types: 6 Cans of beer per week  ? ? ?No Known Allergies ? ?Current Outpatient Medications  ?Medication Sig Dispense Refill  ? aspirin EC 81 MG tablet Take 81 mg by mouth daily.    ? atorvastatin (LIPITOR) 80 MG tablet Take 1 tablet (80 mg total) by mouth every evening. 90 tablet 3  ? calcium carbonate (OS-CAL)  600 MG TABS tablet Take 600 mg by mouth daily with breakfast.    ? cephALEXin (KEFLEX) 500 MG capsule Take 1 capsule (500 mg total) by mouth 2 (two) times daily. 20 capsule 0  ? fluticasone (FLONASE) 50 MCG/ACT nasal spray PLACE 2 SPRAYS INTO BOTH NOSTRILS DAILY. (Patient taking differently: 2 sprays daily as needed for allergies.) 48 g 4  ? hydrocortisone cream 1 % Apply 1 application topically daily as needed for itching. For legs    ? hydrocortisone ointment 0.5 % Apply 1 application topically daily as needed for itching.    ? olmesartan (BENICAR) 5 MG tablet Take 1 tablet (5 mg total) by mouth daily. 90 tablet 3  ? predniSONE (DELTASONE) 2.5 MG tablet Take 1 tablet (2.5 mg total) by mouth daily with breakfast. 90 tablet 3  ? tamsulosin (FLOMAX) 0.4 MG CAPS capsule Take 2 capsules (0.8 mg total) by mouth daily after supper. TAKE 2 CAPSULE  DAILY AFTER SUPPER. 180 capsule 0  ? ?No current  facility-administered medications for this visit.  ? ? ?REVIEW OF SYSTEMS:  ?'[X]'$  denotes positive finding, '[ ]'$  denotes negative finding ?Cardiac  Comments:  ?Chest pain or chest pressure:    ?Shortness of breath upon exertion:    ?Short of breath when lying flat:    ?Irregular heart rhythm:    ?    ?Vascular    ?Pain in calf, thigh, or hip brought on by ambulation: x   ?Pain in feet at night that wakes you up from your sleep:     ?Blood clot in your veins:    ?Leg swelling:     ?    ?Pulmonary    ?Oxygen at home:    ?Productive cough:     ?Wheezing:     ?    ?Neurologic    ?Sudden weakness in arms or legs:     ?Sudden numbness in arms or legs:     ?Sudden onset of difficulty speaking or slurred speech:    ?Temporary loss of vision in one eye:     ?Problems with dizziness:     ?    ?Gastrointestinal    ?Blood in stool:     ?Vomited blood:     ?    ?Genitourinary    ?Burning when urinating:     ?Blood in urine:    ?    ?Psychiatric    ?Major depression:     ?    ?Hematologic    ?Bleeding problems:    ?Problems with blood clotting too easily:    ?    ?Skin    ?Rashes or ulcers:    ?    ?Constitutional    ?Fever or chills:    ? ?PHYSICAL EXAM:  ? ?Vitals:  ? 02/19/22 1051  ?BP: 124/64  ?Pulse: 74  ?Resp: 20  ?Temp: 98 ?F (36.7 ?C)  ?SpO2: 94%  ?Weight: 176 lb 14.4 oz (80.2 kg)  ?Height: 6' (1.829 m)  ? ? ?GENERAL: The patient is a well-nourished male, in no acute distress. The vital signs are documented above. ?CARDIAC: There is a regular rate and rhythm.  ?VASCULAR: I do not detect carotid bruits. ?He has palpable femoral pulses. ?On the right side he has a palpable dorsalis pedis pulse. ?On the left side I cannot palpate pedal pulses. ?He has no lower extremity swelling. ?PULMONARY: There is good air exchange bilaterally without wheezing or rales. ?ABDOMEN: Soft and non-tender with normal pitched bowel sounds.  I do  not palpate any aneurysm. ?MUSCULOSKELETAL: There are no major deformities or cyanosis. ?NEUROLOGIC: No  focal weakness or paresthesias are detected. ?SKIN: There are no ulcers or rashes noted. ?PSYCHIATRIC: The patient has a normal affect. ? ?DATA:   ? ?ARTERIAL DOPPLER STUDY: I have independently interpreted his arterial Doppler study today. ? ?On the right side there is a triphasic dorsalis pedis and posterior tibial signal.  ABIs 100%.  Toe pressure is 115 mmHg. ? ?On the left side there is a monophasic dorsalis pedis and posterior tibial signal.  ABIs 61%.  Toe pressures 55 mmHg. ? ?BILATERAL GREAT SAPHENOUS VEIN MAP: I have independently interpreted his saphenous vein map. ? ?On the right side the diameters of the vein ranged from 1.2 mm to 2.1 mm.  Thus the vein does not appear to be adequate in size.  The right small saphenous vein is sclerotic. ? ?On the left side the diameters of the saphenous vein ranged from 1.9 mm - 2.3 mm.  Thus it is not adequate in size.  The small saphenous vein on the left has diameters ranging from 2.3-4 mm. ? ? ?Deitra Mayo ?[facility] ?Office [phone] ?

## 2022-02-24 ENCOUNTER — Ambulatory Visit: Payer: Medicare HMO

## 2022-02-26 ENCOUNTER — Ambulatory Visit
Admission: RE | Admit: 2022-02-26 | Discharge: 2022-02-26 | Disposition: A | Discharge status: Home / Self Care | Payer: Medicare HMO | Source: Ambulatory Visit | Attending: Urology | Admitting: Urology

## 2022-02-26 DIAGNOSIS — N62 Hypertrophy of breast: Secondary | ICD-10-CM

## 2022-02-26 DIAGNOSIS — R891 Abnormal level of hormones in specimens from other organs, systems and tissues: Secondary | ICD-10-CM

## 2022-02-26 DIAGNOSIS — J341 Cyst and mucocele of nose and nasal sinus: Secondary | ICD-10-CM | POA: Diagnosis not present

## 2022-02-26 DIAGNOSIS — J3489 Other specified disorders of nose and nasal sinuses: Secondary | ICD-10-CM | POA: Diagnosis not present

## 2022-02-26 DIAGNOSIS — E221 Hyperprolactinemia: Secondary | ICD-10-CM

## 2022-02-26 IMAGING — MR MR HEAD WO/W CM
20 series · 48 of 48 positions shown · IV contrast (8 ML MULTIHANCE)
Comparison: None Available.

CLINICAL DATA: Concern for pituitary mass, no injury, no surgery,
no cancer

EXAM:
MRI HEAD WITHOUT AND WITH CONTRAST
TECHNIQUE: Multiplanar, multiecho pulse sequences of the brain and surrounding
structures were obtained without and with intravenous contrast.
CONTRAST:  8mL MULTIHANCE GADOBENATE DIMEGLUMINE 529 MG/ML IV SOLN

[Series 5: T1 · sagittal · 4.0mm · 0.72mm/px · 1 of 30 slices shown (1 of 4)]
[im 1/30]
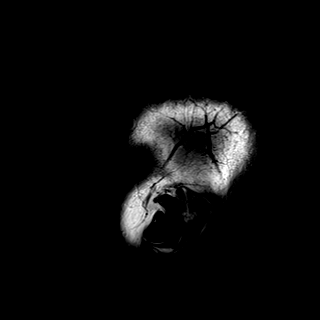

[Series 6: DWI · axial · 3.0mm · 0.94mm/px · z∈[-78,+83]mm · 10 of 176 slices shown]
[im 1/176]
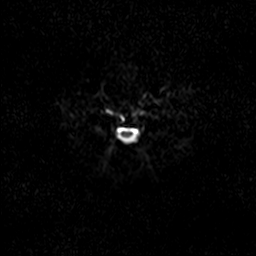
[im 20/176]
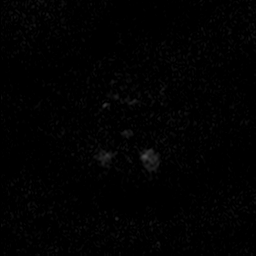
[im 39/176]
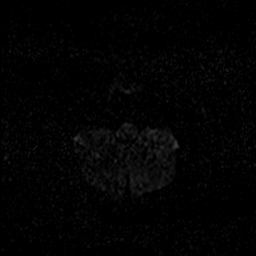
[im 59/176]
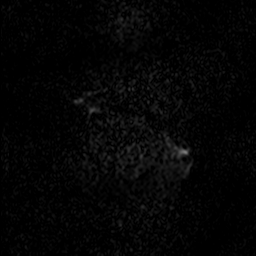
[im 78/176]
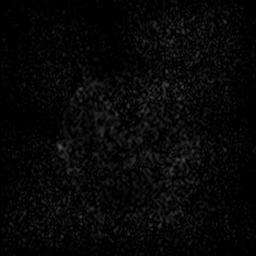
[im 98/176]
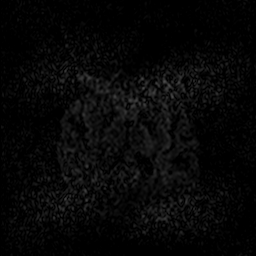
[im 117/176]
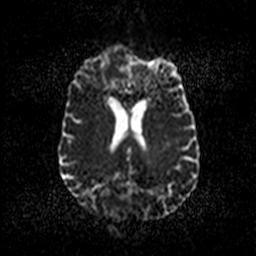
[im 137/176]
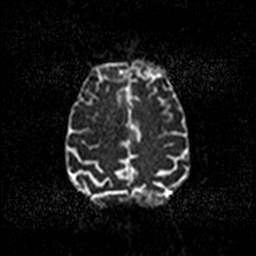
[im 156/176]
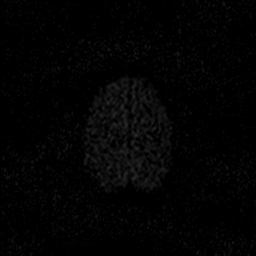
[im 176/176]
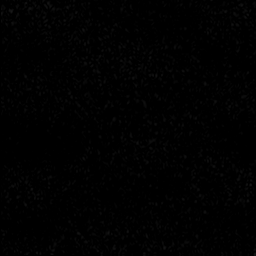

[Series 7: ax dwi_tracew · axial · 3.0mm · 0.94mm/px · z∈[-78,+83]mm · 5 of 92 slices shown]
[im 1/92]
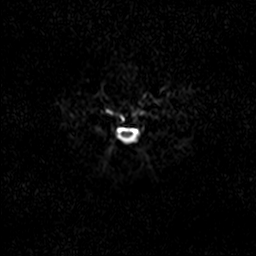
[im 23/92]
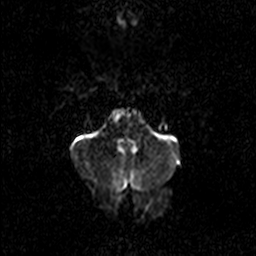
[im 46/92]
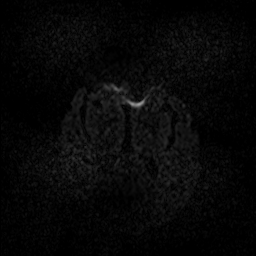
[im 69/92]
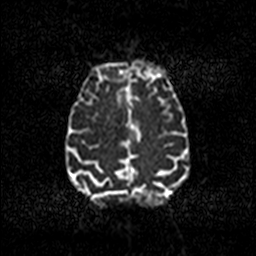
[im 92/92]
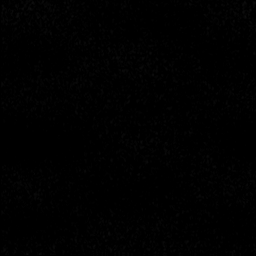

[Series 8: ax dwi_adc · axial · 3.0mm · 0.94mm/px · z∈[-78,+83]mm · 2 of 46 slices shown]
[im 1/46]
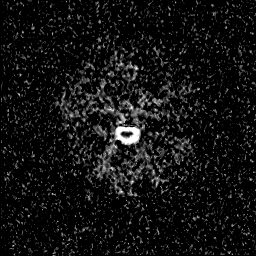
[im 46/46]
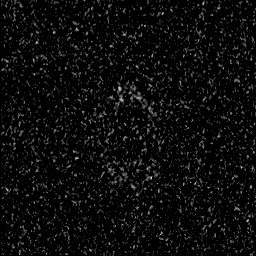

[Series 9: T2 · axial · 4.0mm · 0.36mm/px · 1 of 33 slices shown (1 of 2)]
[im 1/33]
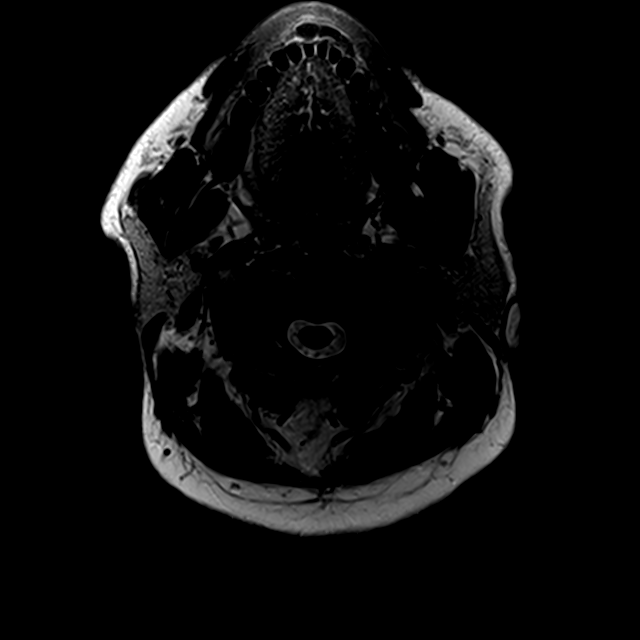

[Series 11: swi_images · axial · 3.0mm · 0.90mm/px · z∈[-86,+78]mm · 4 of 56 slices shown]
[im 1/56]
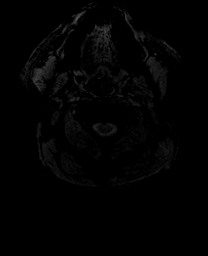
[im 19/56]
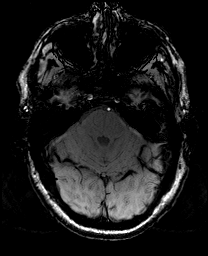
[im 37/56]
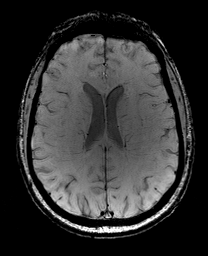
[im 56/56]
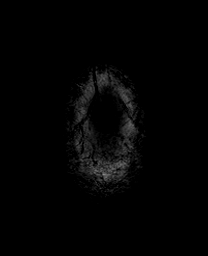

[Series 12: FLAIR · axial · 3.0mm · 0.72mm/px · z∈[-80,+81]mm · 2 of 28 slices shown]
[im 1/28]
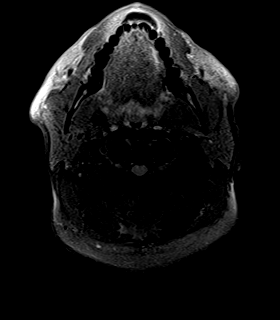
[im 28/28]
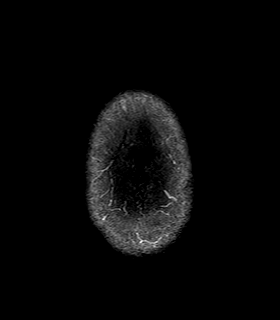

[Series 13: T1 · sagittal · 3.0mm · 0.42mm/px · 1 of 13 slices shown (2 of 4)]
[im 1/13]
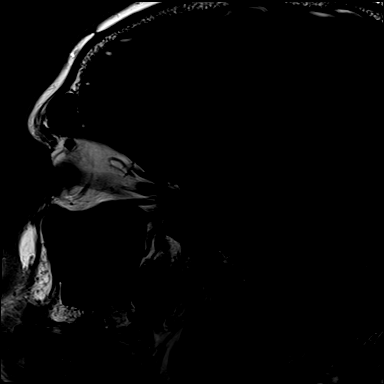

[Series 14: T2 · coronal · 3.0mm · 0.42mm/px · 1 of 10 slices shown (2 of 2)]
[im 1/10]
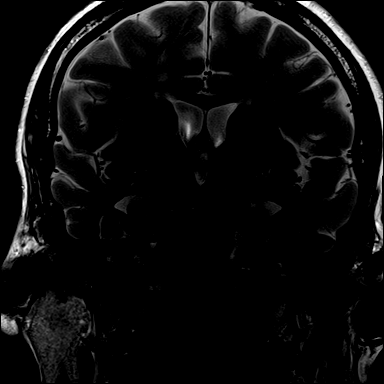

[Series 15: T1 · coronal · 3.0mm · 0.42mm/px · 1 of 10 slices shown (3 of 4)]
[im 1/10]
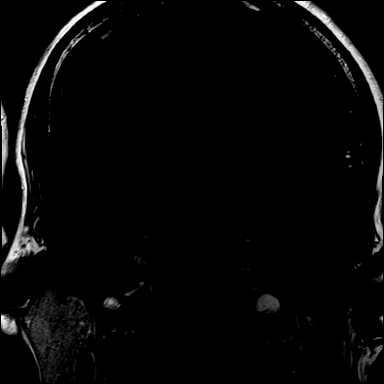

[Series 16: T1 · coronal · non-contrast · 3.0mm · 0.62mm/px · 1 of 9 slices shown (4 of 4)]
[im 1/9]
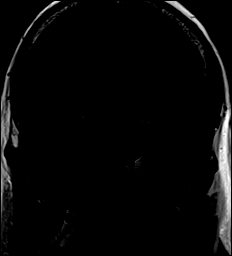

[Series 17: cor post dyn · coronal · 3.0mm · 0.62mm/px · 1 of 9 slices shown (1 of 6)]
[im 1/9]
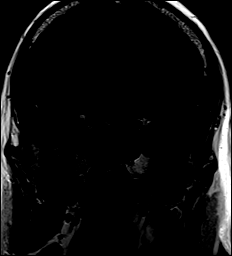

[Series 18: cor post dyn · coronal · 3.0mm · 0.62mm/px · 1 of 9 slices shown (2 of 6)]
[im 1/9]
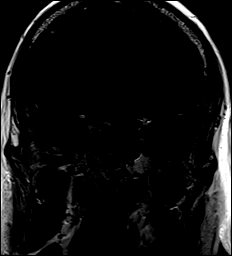

[Series 19: cor post dyn · coronal · 3.0mm · 0.62mm/px · 1 of 9 slices shown (3 of 6)]
[im 1/9]
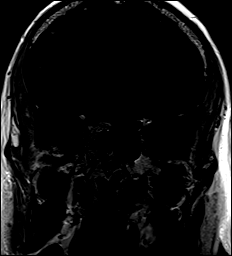

[Series 20: cor post dyn · coronal · 3.0mm · 0.62mm/px · 1 of 9 slices shown (4 of 6)]
[im 1/9]
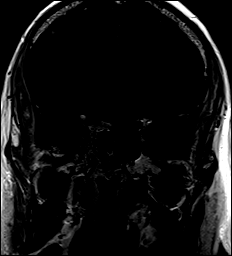

[Series 21: cor post dyn · coronal · 3.0mm · 0.62mm/px · 1 of 9 slices shown (5 of 6)]
[im 1/9]
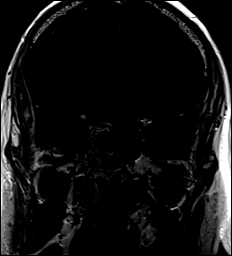

[Series 22: cor post dyn · coronal · 3.0mm · 0.62mm/px · 1 of 9 slices shown (6 of 6)]
[im 1/9]
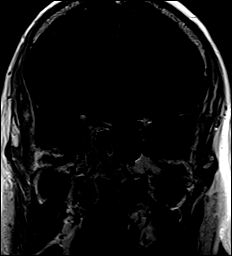

[Series 23: T1 post-contrast · sagittal · 3.0mm · 0.42mm/px · 1 of 13 slices shown (1 of 3)]
[im 1/13]
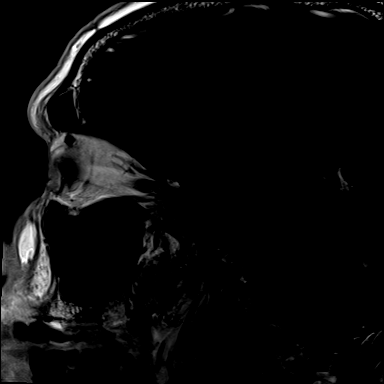

[Series 24: T1 post-contrast · coronal · 3.0mm · 0.42mm/px · 1 of 10 slices shown (2 of 3)]
[im 1/10]
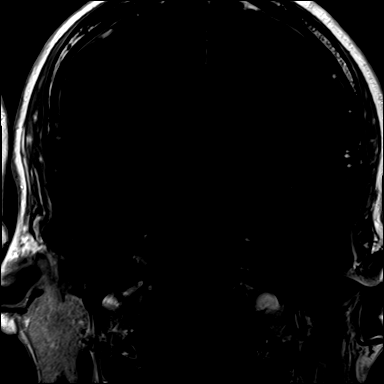

[Series 25: T1 post-contrast · axial · 1.0mm · 0.90mm/px · z∈[-87,+87]mm · 11 of 176 slices shown (3 of 3)]
[im 1/176]
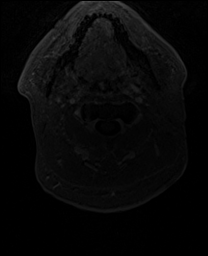
[im 18/176]
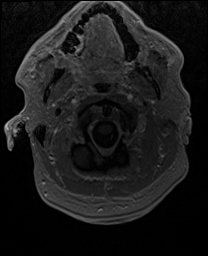
[im 36/176]
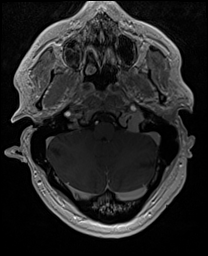
[im 53/176]
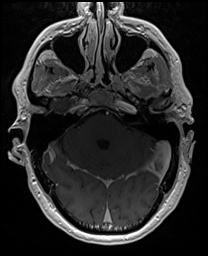
[im 71/176]
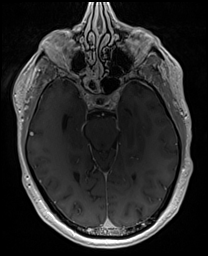
[im 88/176]
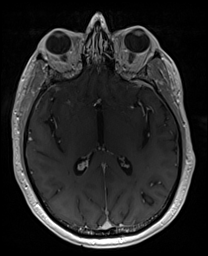
[im 106/176]
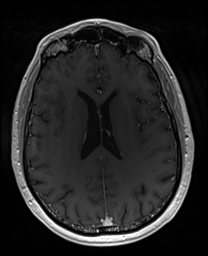
[im 123/176]
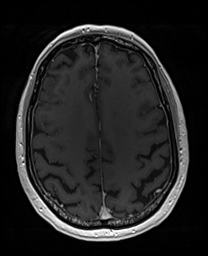
[im 141/176]
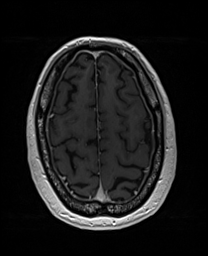
[im 158/176]
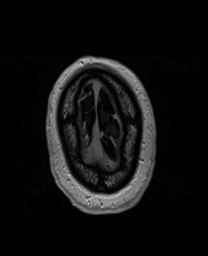
[im 176/176]
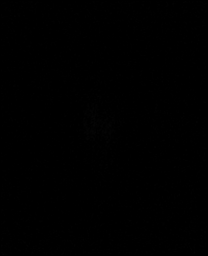

[48 of 48 positions shown; findings below may reference images not displayed]

FINDINGS: Evaluation is somewhat limited by artifact affecting several
sequences.

Brain: Diffusion-weighted imaging is somewhat limited by artifact;
within this limitation, no definite restricted diffusion is seen to
suggest acute or subacute infarct.

No acute hemorrhage, mass, mass effect, or midline shift. No
hydrocephalus or extra-axial collection. No hemosiderin deposition
to suggest remote hemorrhage. No abnormal parenchymal or meningeal
enhancement.

The pituitary gland is somewhat small but overall normal in
appearance. Normal sella. On precontrast imaging, a normal pituitary
bright spot is seen. No focal mass is seen. The infundibulum is
midline and normal in appearance.

Vascular: Normal arterial flow voids. Patent venous sinuses on
postcontrast imaging.

Skull and upper cervical spine: Normal marrow signal.

Sinuses/Orbits: Mucosal thickening in the right sphenoid sinus and
right-greater-than-left ethmoid air cells. Mucous retention cyst in
the right maxillary sinus. The orbits are unremarkable.

Other: Trace fluid in the right mastoid air cells.
IMPRESSION: Evaluation is somewhat limited by artifact affecting several
sequences. Within this limitation, no acute intracranial process. No
evidence of pituitary mass.

## 2022-02-26 MED ORDER — GADOBENATE DIMEGLUMINE 529 MG/ML IV SOLN
8.0000 mL | Freq: Once | INTRAVENOUS | Status: AC | PRN
  Administered 2022-02-26: 8 mL via INTRAVENOUS

## 2022-02-27 DIAGNOSIS — M5416 Radiculopathy, lumbar region: Secondary | ICD-10-CM | POA: Diagnosis not present

## 2022-03-02 ENCOUNTER — Other Ambulatory Visit (HOSPITAL_BASED_OUTPATIENT_CLINIC_OR_DEPARTMENT_OTHER): Payer: Self-pay | Admitting: Neurological Surgery

## 2022-03-02 DIAGNOSIS — R338 Other retention of urine: Secondary | ICD-10-CM | POA: Diagnosis not present

## 2022-03-02 DIAGNOSIS — N319 Neuromuscular dysfunction of bladder, unspecified: Secondary | ICD-10-CM | POA: Diagnosis not present

## 2022-03-02 DIAGNOSIS — M5416 Radiculopathy, lumbar region: Secondary | ICD-10-CM

## 2022-03-02 DIAGNOSIS — N5201 Erectile dysfunction due to arterial insufficiency: Secondary | ICD-10-CM | POA: Diagnosis not present

## 2022-03-02 DIAGNOSIS — N62 Hypertrophy of breast: Secondary | ICD-10-CM | POA: Diagnosis not present

## 2022-03-03 ENCOUNTER — Ambulatory Visit: Payer: Medicare HMO | Admitting: Orthopaedic Surgery

## 2022-03-04 ENCOUNTER — Ambulatory Visit (HOSPITAL_BASED_OUTPATIENT_CLINIC_OR_DEPARTMENT_OTHER): Payer: Medicare HMO

## 2022-03-04 ENCOUNTER — Ambulatory Visit (INDEPENDENT_AMBULATORY_CARE_PROVIDER_SITE_OTHER): Payer: Medicare HMO | Admitting: Adult Health

## 2022-03-04 ENCOUNTER — Encounter: Payer: Self-pay | Admitting: Adult Health

## 2022-03-04 VITALS — BP 120/76 | HR 72 | Temp 98.6°F | Ht 72.0 in | Wt 172.0 lb

## 2022-03-04 DIAGNOSIS — M353 Polymyalgia rheumatica: Secondary | ICD-10-CM

## 2022-03-04 DIAGNOSIS — J40 Bronchitis, not specified as acute or chronic: Secondary | ICD-10-CM

## 2022-03-04 DIAGNOSIS — J0130 Acute sphenoidal sinusitis, unspecified: Secondary | ICD-10-CM

## 2022-03-04 LAB — SEDIMENTATION RATE: Sed Rate: 89 mm/hr — ABNORMAL HIGH (ref 0–20)

## 2022-03-04 MED ORDER — HYDROCODONE BIT-HOMATROP MBR 5-1.5 MG/5ML PO SOLN: 5.0000 mL | Freq: Three times a day (TID) | ORAL | 0 refills | Status: DC | PRN

## 2022-03-04 MED ORDER — DOXYCYCLINE HYCLATE 100 MG PO CAPS: 100.0000 mg | ORAL_CAPSULE | Freq: Two times a day (BID) | ORAL | 0 refills | Status: DC

## 2022-03-04 MED ORDER — ALBUTEROL SULFATE HFA 108 (90 BASE) MCG/ACT IN AERS: 2.0000 | INHALATION_SPRAY | Freq: Four times a day (QID) | RESPIRATORY_TRACT | 0 refills | Status: DC | PRN

## 2022-03-04 NOTE — Progress Notes (Signed)
? ?Subjective:  ? ? Patient ID: Brandon Dixon, male    DOB: Mar 02, 1946, 76 y.o.   MRN: [id number] ? ?HPI ? ?76 year old male who  has a past medical history of ALLERGIC RHINITIS (09/26/2007), BENIGN PROSTATIC HYPERTROPHY (05/27/2007), COLONIC POLYPS, HX OF (05/27/2007), DERMATITIS (08/25/2010), GERD (05/27/2007), HYPERCHOLESTEROLEMIA (09/26/2007), and Peripheral vascular disease ([place]). ? ?He presents to the office today for an acute issue of dry cough x 10 days . Associated symptoms include shortness of breath, wheezing and sinus pain and pressure. Symptoms are worse during the night and cough is keeping him up at night.  ? ?He did have a MRI of the brain recently which showed mucosal thickening of the right sphenoid sinus.  ? ?He denies fevers or chills.  ? ?He would also like to check his CRP today due to history of polymyalgia rheumatica  ? ?Review of Systems ?See HPI  ? ?Past Medical History:  ?Diagnosis Date  ? ALLERGIC RHINITIS 09/26/2007  ? BENIGN PROSTATIC HYPERTROPHY 05/27/2007  ? COLONIC POLYPS, HX OF 05/27/2007  ? DERMATITIS 08/25/2010  ? GERD 05/27/2007  ? HYPERCHOLESTEROLEMIA 09/26/2007  ? Peripheral vascular disease ([place])   ? ? ?Social History  ? ?Socioeconomic History  ? Marital status: Married  ?  Spouse name: Not on file  ? Number of children: Not on file  ? Years of education: Not on file  ? Highest education level: Not on file  ?Occupational History  ? Not on file  ?Tobacco Use  ? Smoking status: Former  ?  Types: Cigarettes  ?  Quit date: 10/19/2001  ?  Years since quitting: 20.3  ? Smokeless tobacco: Never  ?Vaping Use  ? Vaping Use: Never used  ?Substance and Sexual Activity  ? Alcohol use: Yes  ?  Alcohol/week: 6.0 standard drinks  ?  Types: 6 Cans of beer per week  ? Drug use: No  ? Sexual activity: Not on file  ?Other Topics Concern  ? Not on file  ?Social History Narrative  ? Works as a Physicist, medical   ? Married   ? Three daughters   ? ?Social Determinants of Health  ? ?Financial Resource Strain: Not on file   ?Food Insecurity: Not on file  ?Transportation Needs: Not on file  ?Physical Activity: Not on file  ?Stress: Not on file  ?Social Connections: Not on file  ?Intimate Partner Violence: Not on file  ? ? ?Past Surgical History:  ?Procedure Laterality Date  ? LOWER EXTREMITY ANGIOGRAM Left 04/30/2014  ? Procedure: LOWER EXTREMITY ANGIOGRAM;  Surgeon: Angelia Mould, MD;  Location: [facility] CATH LAB;  Service: Cardiovascular;  Laterality: Left;  ? ROTATOR CUFF REPAIR    ? VASECTOMY    ? ? ?Family History  ?Problem Relation Age of Onset  ? Heart disease Mother   ? Heart attack Mother   ?     63   ? Cancer Father   ?     lung ca  ? Hypertension Sister   ? Diabetes Brother   ? Hypertension Sister   ? Diabetes Brother   ? Diabetes Brother   ? Diabetes Brother   ? Diabetes Brother   ? Diabetes Daughter   ? ? ?No Known Allergies ? ?Current Outpatient Medications on File Prior to Visit  ?Medication Sig Dispense Refill  ? aspirin EC 81 MG tablet Take 81 mg by mouth daily.    ? atorvastatin (LIPITOR) 80 MG tablet Take 1 tablet (80 mg total) by mouth  every evening. 90 tablet 3  ? cabergoline (DOSTINEX) 0.5 MG tablet Take 0.25 mg by mouth 2 (two) times a week.    ? calcium carbonate (OS-CAL) 600 MG TABS tablet Take 600 mg by mouth daily with breakfast.    ? dutasteride (AVODART) 0.5 MG capsule     ? fluticasone (FLONASE) 50 MCG/ACT nasal spray PLACE 2 SPRAYS INTO BOTH NOSTRILS DAILY. (Patient taking differently: 2 sprays daily as needed for allergies.) 48 g 4  ? hydrocortisone cream 1 % Apply 1 application topically daily as needed for itching. For legs    ? hydrocortisone ointment 0.5 % Apply 1 application topically daily as needed for itching.    ? olmesartan (BENICAR) 5 MG tablet Take 1 tablet (5 mg total) by mouth daily. 90 tablet 3  ? predniSONE (DELTASONE) 2.5 MG tablet Take 1 tablet (2.5 mg total) by mouth daily with breakfast. 90 tablet 3  ? tamsulosin (FLOMAX) 0.4 MG CAPS capsule Take 2 capsules (0.8 mg total) by mouth  daily after supper. TAKE 2 CAPSULE  DAILY AFTER SUPPER. 180 capsule 0  ? ?No current facility-administered medications on file prior to visit.  ? ? ?BP 120/76   Pulse 72   Temp 98.6 ?F (37 ?C) (Oral)   Ht 6' (1.829 m)   Wt 172 lb (78 kg)   SpO2 96%   BMI 23.33 kg/m?  ? ? ?   ?Objective:  ? Physical Exam ?Vitals and nursing note reviewed.  ?Constitutional:   ?   Appearance: Normal appearance.  ?HENT:  ?   Nose: Rhinorrhea present. Rhinorrhea is purulent.  ?   Right Turbinates: Enlarged and swollen.  ?   Left Turbinates: Swollen.  ?   Right Sinus: Maxillary sinus tenderness and frontal sinus tenderness present.  ?   Left Sinus: Maxillary sinus tenderness and frontal sinus tenderness present.  ?Cardiovascular:  ?   Rate and Rhythm: Normal rate and regular rhythm.  ?Pulmonary:  ?   Breath sounds: Wheezing (trace expiratory throughout) present. No decreased breath sounds, rhonchi or rales.  ?Musculoskeletal:     ?   General: Normal range of motion.  ?Skin: ?   General: Skin is warm and dry.  ?   Capillary Refill: Capillary refill takes less than 2 seconds.  ?Neurological:  ?   General: No focal deficit present.  ?   Mental Status: He is alert and oriented to person, place, and time.  ?Psychiatric:     ?   Mood and Affect: Mood normal.     ?   Behavior: Behavior normal.     ?   Thought Content: Thought content normal.     ?   Judgment: Judgment normal.  ? ?   ?Assessment & Plan:  ?1. Acute non-recurrent sphenoidal sinusitis ? ?- doxycycline (VIBRAMYCIN) 100 MG capsule; Take 1 capsule (100 mg total) by mouth 2 (two) times daily.  Dispense: 14 capsule; Refill: 0 ?- HYDROcodone bit-homatropine (HYCODAN) 5-1.5 MG/5ML syrup; Take 5 mLs by mouth every 8 (eight) hours as needed for cough.  Dispense: 120 mL; Refill: 0 ? ?2. Bronchitis ? ?- albuterol (VENTOLIN HFA) 108 (90 Base) MCG/ACT inhaler; Inhale 2 puffs into the lungs every 6 (six) hours as needed for wheezing or shortness of breath.  Dispense: 8 g; Refill: 0 ? ?3.  Polymyalgia rheumatica ([place]) ? ?- Sedimentation Rate; Future ? ?Dorothyann Peng, NP ? ?Time of total for care on the day of the encounter 30 minutes. This includes time spent in  both face-to-face and non-face-to-face activities including preparing for the visit, reviewing the chart, time spent with patient, evaluation and counseling patient/family/caregiver, coordinating care, and time spent documenting in the chart which was performed on the date of service (03/04/2022). Note: this excludes any time spent performing billable procedures or separate charges (such as time spent counseling smoking cessation); these charges are billed separately.  ? ? ?

## 2022-03-05 NOTE — Telephone Encounter (Signed)
FYI

## 2022-03-06 ENCOUNTER — Ambulatory Visit
Admission: RE | Admit: 2022-03-06 | Discharge: 2022-03-06 | Disposition: A | Discharge status: Home / Self Care | Payer: Medicare HMO | Source: Ambulatory Visit | Attending: Neurological Surgery | Admitting: Neurological Surgery

## 2022-03-06 DIAGNOSIS — M5416 Radiculopathy, lumbar region: Secondary | ICD-10-CM

## 2022-03-06 DIAGNOSIS — R200 Anesthesia of skin: Secondary | ICD-10-CM | POA: Diagnosis not present

## 2022-03-06 DIAGNOSIS — M48061 Spinal stenosis, lumbar region without neurogenic claudication: Secondary | ICD-10-CM | POA: Diagnosis not present

## 2022-03-06 DIAGNOSIS — M545 Low back pain, unspecified: Secondary | ICD-10-CM | POA: Diagnosis not present

## 2022-03-06 DIAGNOSIS — M4316 Spondylolisthesis, lumbar region: Secondary | ICD-10-CM | POA: Diagnosis not present

## 2022-03-06 IMAGING — MR MR LUMBAR SPINE W/O CM
4 of 5 series · 18 of 48 positions shown · non-contrast
Comparison: Are spine MRI [DATE]

CLINICAL DATA: Low back pain and numbness in right leg

EXAM:
MRI LUMBAR SPINE WITHOUT CONTRAST
TECHNIQUE: Multiplanar, multisequence MR imaging of the lumbar spine was
performed. No intravenous contrast was administered.

[Series 5: T2 · sagittal · 4.0mm · 0.73mm/px · 6 of 15 slices shown (1 of 2)]
[im 1/15]
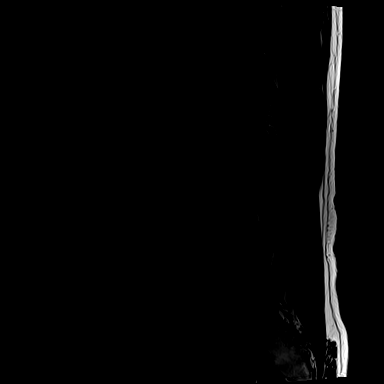
[im 3/15]
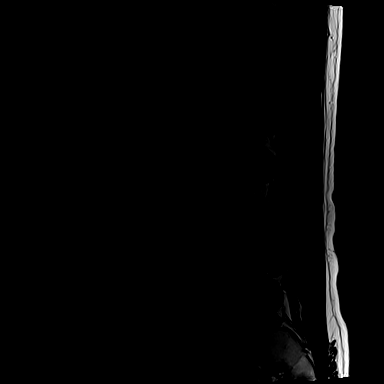
[im 6/15]
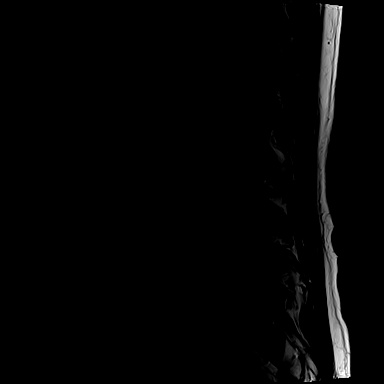
[im 9/15]
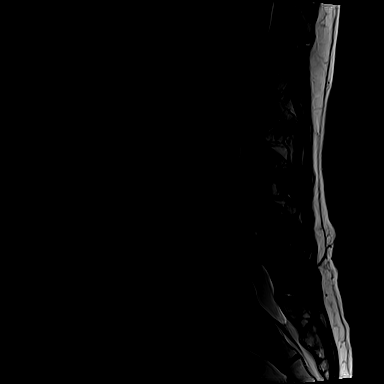
[im 12/15]
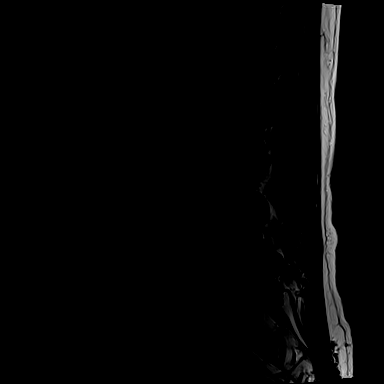
[im 15/15]
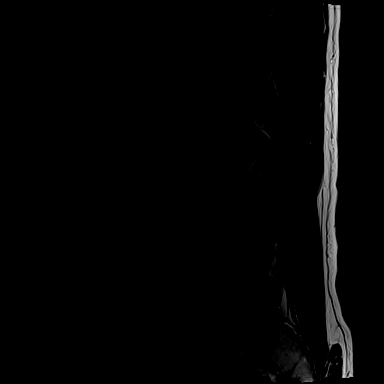

[Series 6: T1 · sagittal · 4.0mm · 0.88mm/px · 3 of 15 slices shown (1 of 2)]
[im 1/15]
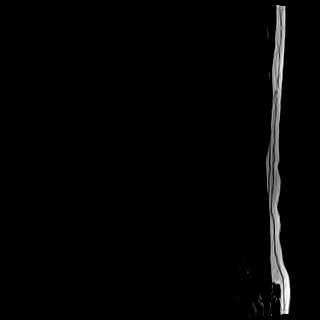
[im 8/15]
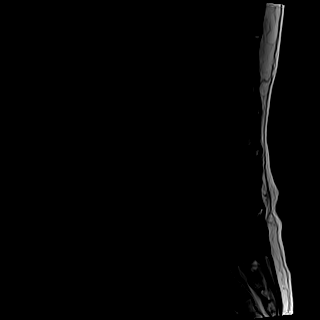
[im 15/15]
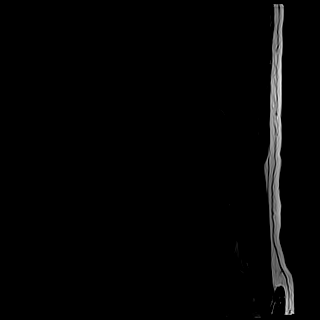

[Series 10: T1 · axial · 4.0mm · 0.28mm/px · z∈[-132,+54]mm · 3 of 43 slices shown (2 of 2)]
[im 6/43]
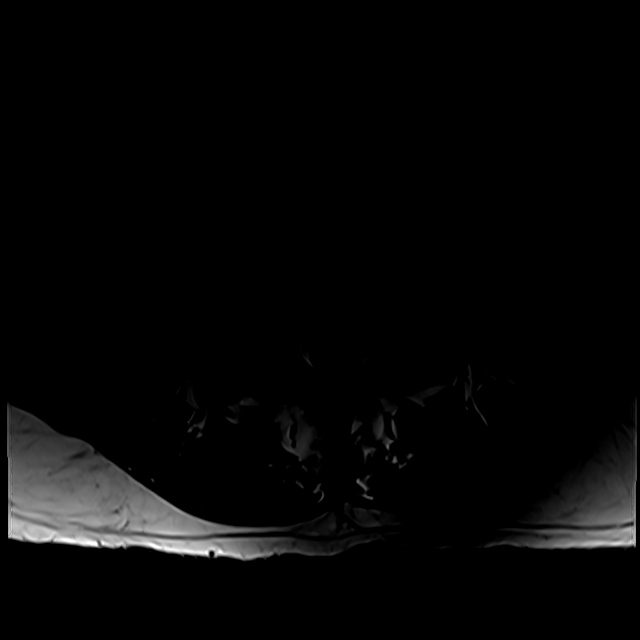
[im 23/43]
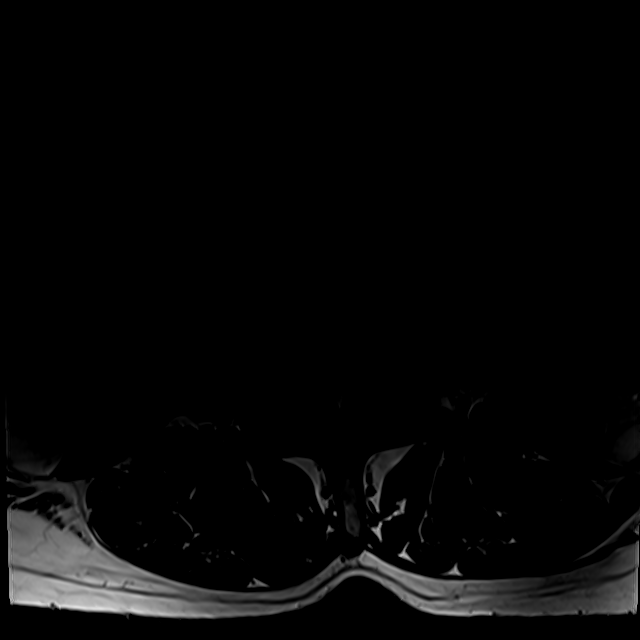
[im 37/43]
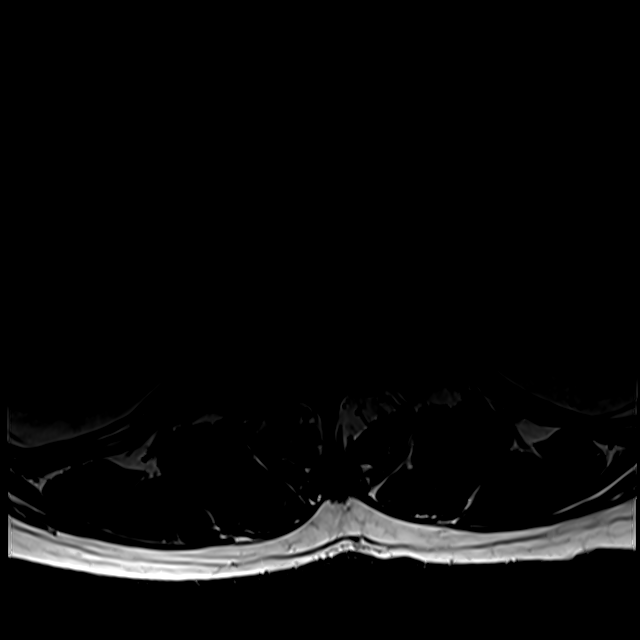

[Series 13: T2 · axial · 4.0mm · 0.28mm/px · z∈[-146,+54]mm · 6 of 43 slices shown (2 of 2)]
[im 3/43]
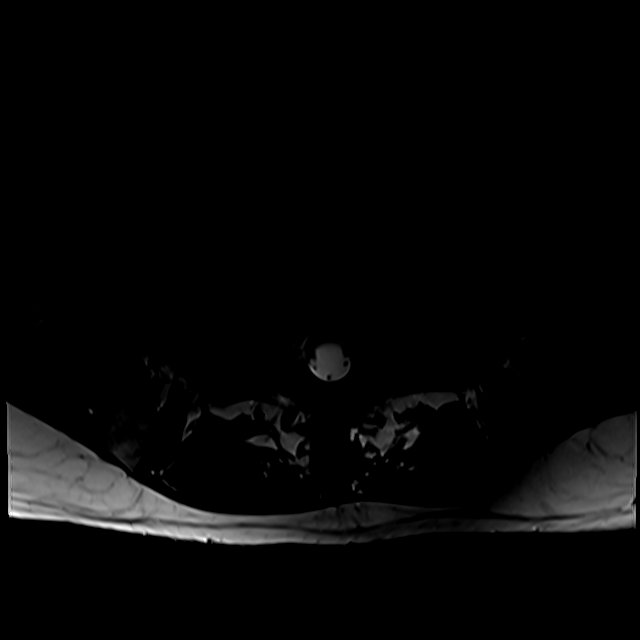
[im 6/43]
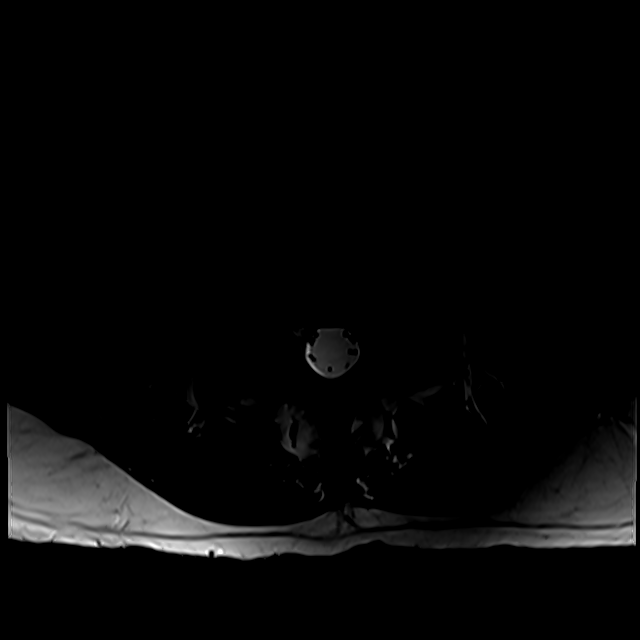
[im 9/43]
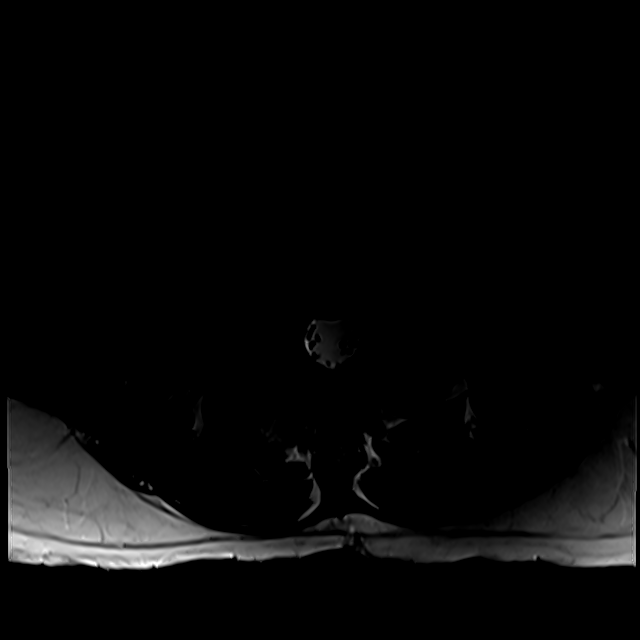
[im 15/43]
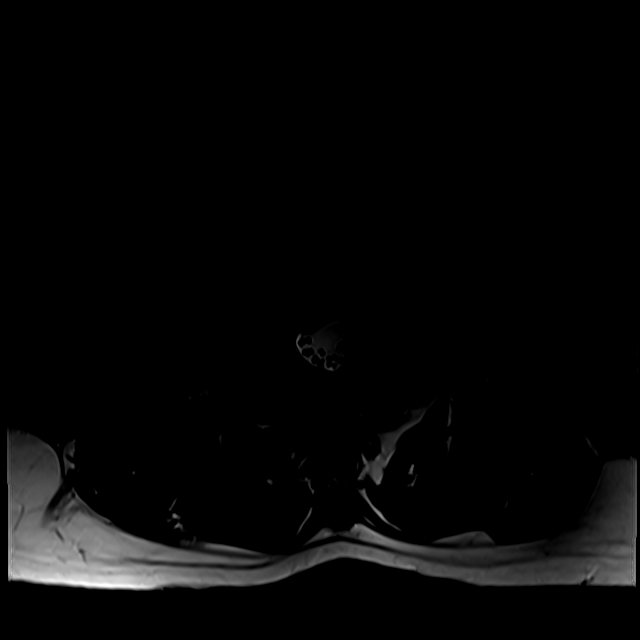
[im 23/43]
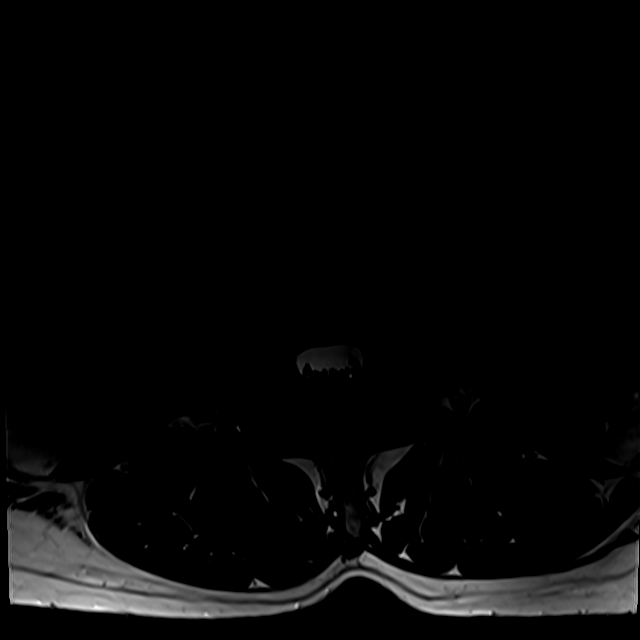
[im 37/43]
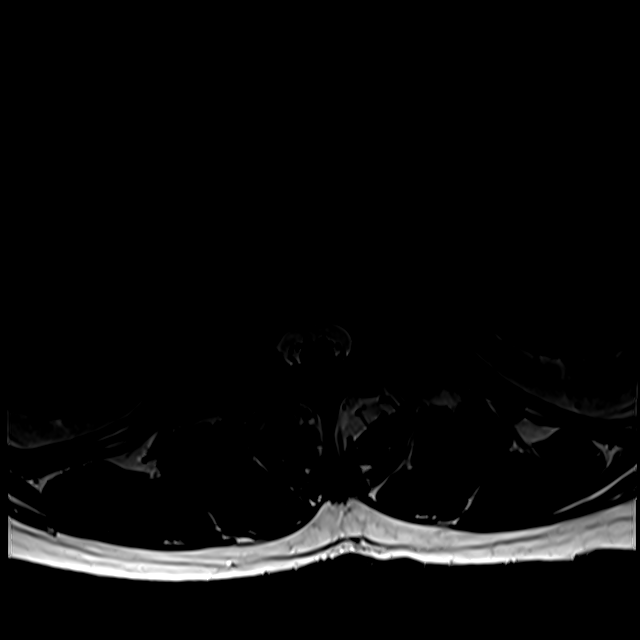

[18 of 48 positions shown; findings below may reference images not displayed]

FINDINGS: Segmentation: Standard; the lowest formed disc space is designated
L5-S1.

Alignment: There is grade 1 anterolisthesis of L4 on L5, unchanged.
Alignment is otherwise normal.

Vertebrae: Vertebral body heights are preserved. Background marrow
signal is normal. There are T1 hypointense, T2/STIR hyperintense
lesions in the L3 and L4 vertebral bodies, unchanged since [JX]
likely reflecting atypical hemangiomas. There is no suspicious
marrow signal abnormality. There is no marrow edema or evidence of
acute injury.

Conus medullaris and cauda equina: Conus extends to the L1-L2 level.
Conus and cauda equina appear normal.

Paraspinal and other soft tissues: There is a 0.8 cm left adrenal
nodule technically indeterminate but unchanged since [JX] favoring
benign etiology. The paraspinal soft tissues are unremarkable.

Disc levels:

There is mild disc desiccation and narrowing at L4-L5 and L5-S1.

T12-L1: No significant spinal canal or neural foraminal stenosis

L1-L2: No significant spinal canal or neural foraminal stenosis

L2-L3: Mild facet arthropathy without significant spinal canal or
neural foraminal stenosis

L3-L4: Mild facet arthropathy without significant spinal canal or
neural foraminal stenosis.

L4-L5: There is grade 1 anterolisthesis, moderate bilateral facet
arthropathy, with a right subarticular zone protrusion and
superiorly migrated extrusion and mild bilateral facet arthropathy
resulting in narrowing of the right subarticular zone with contact
of the traversing right L5 nerve root and mild bilateral neural
foraminal stenosis. Compared to the study from [JX], the disc
extrusion is decreased in size.

L5-S1: There is mild bilateral facet arthropathy resulting in mild
right worse than left neural foraminal stenosis and no significant
spinal canal stenosis.
IMPRESSION: 1. Grade 1 anterolisthesis of L4 on L5 with moderate facet
arthropathy and a right subarticular zone superiorly migrated
extrusion resulting in narrowing of the right subarticular zone with
contact of the traversing L4 nerve root. The disc extrusion is
decreased in size since [DATE]. Facet arthropathy at L5-S1 contributing to mild right worse than
left neural foraminal stenosis.
3. Small left adrenal nodule is technically indeterminate but
unchanged since [JX], favoring a benign adenoma. No specific imaging
follow-up is required.

## 2022-03-11 DIAGNOSIS — M5416 Radiculopathy, lumbar region: Secondary | ICD-10-CM | POA: Diagnosis not present

## 2022-03-31 ENCOUNTER — Other Ambulatory Visit: Payer: Self-pay | Admitting: Adult Health

## 2022-03-31 ENCOUNTER — Ambulatory Visit: Payer: Medicare HMO | Admitting: Orthopaedic Surgery

## 2022-03-31 DIAGNOSIS — N400 Enlarged prostate without lower urinary tract symptoms: Secondary | ICD-10-CM

## 2022-04-14 DIAGNOSIS — R3914 Feeling of incomplete bladder emptying: Secondary | ICD-10-CM | POA: Diagnosis not present

## 2022-04-14 DIAGNOSIS — E221 Hyperprolactinemia: Secondary | ICD-10-CM | POA: Diagnosis not present

## 2022-04-14 DIAGNOSIS — N5201 Erectile dysfunction due to arterial insufficiency: Secondary | ICD-10-CM | POA: Diagnosis not present

## 2022-04-14 DIAGNOSIS — N62 Hypertrophy of breast: Secondary | ICD-10-CM | POA: Diagnosis not present

## 2022-04-17 ENCOUNTER — Encounter: Payer: Self-pay | Admitting: Adult Health

## 2022-04-17 ENCOUNTER — Ambulatory Visit (INDEPENDENT_AMBULATORY_CARE_PROVIDER_SITE_OTHER): Payer: Medicare HMO | Admitting: Adult Health

## 2022-04-17 VITALS — BP 120/70 | HR 80 | Temp 97.7°F | Ht 72.0 in | Wt 177.0 lb

## 2022-04-17 DIAGNOSIS — R109 Unspecified abdominal pain: Secondary | ICD-10-CM

## 2022-04-17 DIAGNOSIS — N319 Neuromuscular dysfunction of bladder, unspecified: Secondary | ICD-10-CM

## 2022-04-17 DIAGNOSIS — M353 Polymyalgia rheumatica: Secondary | ICD-10-CM | POA: Diagnosis not present

## 2022-04-17 DIAGNOSIS — N400 Enlarged prostate without lower urinary tract symptoms: Secondary | ICD-10-CM

## 2022-04-17 DIAGNOSIS — R1032 Left lower quadrant pain: Secondary | ICD-10-CM | POA: Diagnosis not present

## 2022-04-17 LAB — POCT URINALYSIS DIPSTICK
Bilirubin, UA: NEGATIVE
Blood, UA: POSITIVE
Glucose, UA: NEGATIVE
Ketones, UA: NEGATIVE
Leukocytes, UA: NEGATIVE
Nitrite, UA: NEGATIVE
Protein, UA: NEGATIVE
Spec Grav, UA: <=1.005 — AB (ref 1.010–1.025)
Urobilinogen, UA: 0.2 E.U./dL
pH, UA: 6.5 (ref 5.0–8.0)

## 2022-04-17 LAB — BASIC METABOLIC PANEL
BUN: 10 mg/dL (ref 6–23)
CO2: 30 mEq/L (ref 19–32)
Calcium: 10 mg/dL (ref 8.4–10.5)
Chloride: 102 mEq/L (ref 96–112)
Creatinine, Ser: 0.92 mg/dL (ref 0.40–1.50)
GFR: 81.09 mL/min (ref >60.00)
Glucose, Bld: 85 mg/dL (ref 70–99)
Potassium: 4.1 mEq/L (ref 3.5–5.1)
Sodium: 139 mEq/L (ref 135–145)

## 2022-04-17 LAB — SEDIMENTATION RATE: Sed Rate: 54 mm/hr — ABNORMAL HIGH (ref 0–20)

## 2022-04-17 LAB — PSA: PSA: 1.36 ng/mL (ref 0.10–4.00)

## 2022-04-17 NOTE — Progress Notes (Signed)
Subjective:    Patient ID: Brandon Dixon, male    DOB: 06-04-1946, 76 y.o.   MRN: [id number]  HPI 76 year old male who  has a past medical history of ALLERGIC RHINITIS (09/26/2007), BENIGN PROSTATIC HYPERTROPHY (05/27/2007), COLONIC POLYPS, HX OF (05/27/2007), DERMATITIS (08/25/2010), GERD (05/27/2007), HYPERCHOLESTEROLEMIA (09/26/2007), and Peripheral vascular disease ([place]).  He presents to the office today for left lower quadrant pain. Does not feel like it is pain but it is uncomfortable pressure like sensation. It has been present for quite some time but has been getting worse as time goes on. Discomfort is pretty constant throughout the day. Has not found anything that makes the pain better.  He has normal bowel movements for the most part but does suffer from constipation time to time.   Denies fevers or chills, blood in urine or stool.   No history of kidney stones   Additionally, he would like to recheck his sed rate to history of polymyalgia rheumatica as well as his PSA due to history of BPH  He is currently being seen by urology for neuromuscular dysfunction of his bladder.  He is still self cathing.  He would like to be referred to [facility] for additional opinion to see if there is anything that can be done   Review of Systems See HPI   Past Medical History:  Diagnosis Date   ALLERGIC RHINITIS 09/26/2007   BENIGN PROSTATIC HYPERTROPHY 05/27/2007   COLONIC POLYPS, HX OF 05/27/2007   DERMATITIS 08/25/2010   GERD 05/27/2007   HYPERCHOLESTEROLEMIA 09/26/2007   Peripheral vascular disease ([place])     Social History   Socioeconomic History   Marital status: Married    Spouse name: Not on file   Number of children: Not on file   Years of education: Not on file   Highest education level: Some college, no degree  Occupational History   Not on file  Tobacco Use   Smoking status: Former    Types: Cigarettes    Quit date: 10/19/2001    Years since quitting: 20.5   Smokeless tobacco: Never   Vaping Use   Vaping Use: Never used  Substance and Sexual Activity   Alcohol use: Yes    Alcohol/week: 6.0 standard drinks of alcohol    Types: 6 Cans of beer per week   Drug use: No   Sexual activity: Not on file  Other Topics Concern   Not on file  Social History Narrative   Works as a Physicist, medical    Married    Three daughters    Social Determinants of Health   Financial Resource Strain: Low Risk  (04/16/2022)   Overall Financial Resource Strain (CARDIA)    Difficulty of Paying Living Expenses: Not hard at all  Food Insecurity: No Food Insecurity (04/16/2022)   Hunger Vital Sign    Worried About Running Out of Food in the Last Year: Never true    [place] in the Last Year: Never true  Transportation Needs: No Transportation Needs (04/16/2022)   PRAPARE - Hydrologist (Medical): No    Lack of Transportation (Non-Medical): No  Physical Activity: Sufficiently Active (04/16/2022)   Exercise Vital Sign    Days of Exercise per Week: 4 days    Minutes of Exercise per Session: 60 min  Stress: No Stress Concern Present (04/16/2022)   [place]    Feeling of Stress : Only  a little  Social Connections: Moderately Integrated (04/16/2022)   Social Connection and Isolation Panel [NHANES]    Frequency of Communication with Friends and Family: More than three times a week    Frequency of Social Gatherings with Friends and Family: More than three times a week    Attends Religious Services: More than 4 times per year    Active Member of [employer] or Organizations: No    Attends Archivist Meetings: Not on file    Marital Status: Married  Intimate Partner Violence: Not At Risk (06/26/2020)   Humiliation, Afraid, Rape, and Kick questionnaire    Fear of Current or Ex-Partner: No    Emotionally Abused: No    Physically Abused: No    Sexually Abused: No    Past Surgical History:   Procedure Laterality Date   LOWER EXTREMITY ANGIOGRAM Left 04/30/2014   Procedure: LOWER EXTREMITY ANGIOGRAM;  Surgeon: Angelia Mould, MD;  Location: [facility] CATH LAB;  Service: Cardiovascular;  Laterality: Left;   ROTATOR CUFF REPAIR     VASECTOMY      Family History  Problem Relation Age of Onset   Heart disease Mother    Heart attack Mother        74    Cancer Father        lung ca   Hypertension Sister    Diabetes Brother    Hypertension Sister    Diabetes Brother    Diabetes Brother    Diabetes Brother    Diabetes Brother    Diabetes Daughter     No Known Allergies  Current Outpatient Medications on File Prior to Visit  Medication Sig Dispense Refill   aspirin EC 81 MG tablet Take 81 mg by mouth daily.     atorvastatin (LIPITOR) 80 MG tablet Take 1 tablet (80 mg total) by mouth every evening. 90 tablet 3   calcium carbonate (OS-CAL) 600 MG TABS tablet Take 600 mg by mouth daily with breakfast.     dutasteride (AVODART) 0.5 MG capsule      fluticasone (FLONASE) 50 MCG/ACT nasal spray PLACE 2 SPRAYS INTO BOTH NOSTRILS DAILY. (Patient taking differently: 2 sprays daily as needed for allergies.) 48 g 4   hydrocortisone 2.5 % ointment Apply topically.     hydrocortisone ointment 0.5 % Apply 1 application topically daily as needed for itching.     olmesartan (BENICAR) 5 MG tablet Take 1 tablet (5 mg total) by mouth daily. 90 tablet 3   predniSONE (DELTASONE) 2.5 MG tablet Take 1 tablet (2.5 mg total) by mouth daily with breakfast. 90 tablet 3   tamsulosin (FLOMAX) 0.4 MG CAPS capsule TAKE 2 CAPSULES (0.8 MG TOTAL) BY MOUTH DAILY AFTER SUPPER. (Patient taking differently: Take 0.4 mg by mouth daily after breakfast.) 180 capsule 0   cabergoline (DOSTINEX) 0.5 MG tablet Take 0.25 mg by mouth 2 (two) times a week. (Patient not taking: Reported on 04/17/2022)     No current facility-administered medications on file prior to visit.    BP 120/70   Pulse 80   Temp 97.7 F (36.5  C) (Oral)   Ht 6' (1.829 m)   Wt 177 lb (80.3 kg)   SpO2 98%   BMI 24.01 kg/m       Objective:   Physical Exam Vitals and nursing note reviewed.  Constitutional:      Appearance: He is well-developed.  Cardiovascular:     Rate and Rhythm: Regular rhythm.  Abdominal:  General: Abdomen is flat. Bowel sounds are normal.     Palpations: Abdomen is soft. There is no mass.     Tenderness: There is abdominal tenderness in the left lower quadrant. There is no guarding or rebound.     Hernia: No hernia is present.  Musculoskeletal:        General: Normal range of motion.  Skin:    General: Skin is warm and dry.  Neurological:     General: No focal deficit present.     Mental Status: He is alert and oriented to person, place, and time.  Psychiatric:        Mood and Affect: Mood normal.        Behavior: Behavior normal.        Thought Content: Thought content normal.        Judgment: Judgment normal.       Assessment & Plan:   1. Abdominal pain, unspecified abdominal location  - POC Urinalysis Dipstick- negative for infection. Positive for microscopic blood.   2. Left lower quadrant abdominal pain  - CT Abdomen Pelvis W Contrast; Future - Basic Metabolic Panel; Future - Basic Metabolic Panel  3. Polymyalgia rheumatica (HCC)  - Sedimentation Rate; Future - Sedimentation Rate  4. Benign prostatic hyperplasia without lower urinary tract symptoms  - PSA; Future - PSA  5. Neuromuscular dysfunction of bladder  - Ambulatory referral to Urology  Dorothyann Peng, NP

## 2022-04-28 ENCOUNTER — Ambulatory Visit
Admission: RE | Admit: 2022-04-28 | Discharge: 2022-04-28 | Disposition: A | Discharge status: Home / Self Care | Payer: Medicare HMO | Source: Ambulatory Visit | Attending: Adult Health | Admitting: Adult Health

## 2022-04-28 DIAGNOSIS — K5730 Diverticulosis of large intestine without perforation or abscess without bleeding: Secondary | ICD-10-CM | POA: Diagnosis not present

## 2022-04-28 DIAGNOSIS — R1032 Left lower quadrant pain: Secondary | ICD-10-CM

## 2022-04-28 DIAGNOSIS — M47816 Spondylosis without myelopathy or radiculopathy, lumbar region: Secondary | ICD-10-CM | POA: Diagnosis not present

## 2022-04-28 DIAGNOSIS — I700 Atherosclerosis of aorta: Secondary | ICD-10-CM | POA: Diagnosis not present

## 2022-04-28 MED ORDER — IOPAMIDOL (ISOVUE-300) INJECTION 61%
100.0000 mL | Freq: Once | INTRAVENOUS | Status: AC | PRN
  Administered 2022-04-28: 100 mL via INTRAVENOUS

## 2022-05-06 ENCOUNTER — Ambulatory Visit: Payer: Medicare HMO | Admitting: Orthopaedic Surgery

## 2022-06-08 ENCOUNTER — Telehealth: Payer: Self-pay | Admitting: Adult Health

## 2022-06-08 DIAGNOSIS — R339 Retention of urine, unspecified: Secondary | ICD-10-CM | POA: Diagnosis not present

## 2022-06-08 NOTE — Telephone Encounter (Signed)
Pt does not want to go back to urologist due to copay is higher and would like to know if cory would do blood work to see where is hormone level are. Pt is taking  cabergoline (DOSTINEX) 0.5 MG tablet   03/02/2022    Sig - Route: Take 0.25 mg by mouth 2 (two) times a week. - Oral   To level off his hormone. Please advise

## 2022-06-09 NOTE — Telephone Encounter (Signed)
Imaging report electronically fax via release with confirmation.

## 2022-06-09 NOTE — Telephone Encounter (Signed)
Patient notified of update  and verbalized understanding. 

## 2022-06-15 DIAGNOSIS — E221 Hyperprolactinemia: Secondary | ICD-10-CM | POA: Diagnosis not present

## 2022-06-15 DIAGNOSIS — Z79899 Other long term (current) drug therapy: Secondary | ICD-10-CM | POA: Diagnosis not present

## 2022-06-15 DIAGNOSIS — E291 Testicular hypofunction: Secondary | ICD-10-CM | POA: Diagnosis not present

## 2022-07-10 ENCOUNTER — Telehealth: Payer: Self-pay | Admitting: Adult Health

## 2022-07-10 NOTE — Telephone Encounter (Signed)
Pt is calling and would like to know if he suppose to be taking dutasteride (AVODART) 0.5 MG capsule pt never got the medication he saw med list on his mychart and If he suppose to be taking please send rx to  [facility] [place] [address] [place] Phone:  [phone]  Fax:  [phone]

## 2022-07-14 DIAGNOSIS — R339 Retention of urine, unspecified: Secondary | ICD-10-CM | POA: Diagnosis not present

## 2022-07-16 ENCOUNTER — Ambulatory Visit (INDEPENDENT_AMBULATORY_CARE_PROVIDER_SITE_OTHER): Payer: Medicare HMO | Admitting: Adult Health

## 2022-07-16 ENCOUNTER — Ambulatory Visit (INDEPENDENT_AMBULATORY_CARE_PROVIDER_SITE_OTHER): Payer: Medicare HMO

## 2022-07-16 ENCOUNTER — Encounter: Payer: Self-pay | Admitting: Adult Health

## 2022-07-16 VITALS — BP 122/62 | HR 60 | Temp 98.2°F | Ht 72.0 in | Wt 172.9 lb

## 2022-07-16 VITALS — BP 122/62 | HR 60 | Temp 98.2°F | Ht 72.0 in | Wt 172.0 lb

## 2022-07-16 DIAGNOSIS — M353 Polymyalgia rheumatica: Secondary | ICD-10-CM

## 2022-07-16 DIAGNOSIS — N400 Enlarged prostate without lower urinary tract symptoms: Secondary | ICD-10-CM | POA: Diagnosis not present

## 2022-07-16 DIAGNOSIS — Z0000 Encounter for general adult medical examination without abnormal findings: Secondary | ICD-10-CM | POA: Diagnosis not present

## 2022-07-16 LAB — SEDIMENTATION RATE: Sed Rate: 23 mm/hr — ABNORMAL HIGH (ref 0–20)

## 2022-07-16 LAB — PSA: PSA: 1.34 ng/mL (ref 0.10–4.00)

## 2022-07-16 NOTE — Patient Instructions (Addendum)
Brandon Dixon , Thank you for taking time to come for your Medicare Wellness Visit. I appreciate your ongoing commitment to your health goals. Please review the following plan we discussed and let me know if I can assist you in the future.   These are the goals we discussed:  Goals       Patient Stated      I will continue to play golf.      Stay healthy (pt-stated)      Increase activity.        This is a list of the screening recommended for you and due dates:  Health Maintenance  Topic Date Due   COVID-19 Vaccine (6 - Pfizer risk series) 08/01/2022*   Flu Shot  01/17/2023*   Tetanus Vaccine  03/09/2028   Pneumonia Vaccine  Completed   Hepatitis C Screening: USPSTF Recommendation to screen - Ages 18-79 yo.  Completed   Zoster (Shingles) Vaccine  Completed   HPV Vaccine  Aged Out   Colon Cancer Screening  Discontinued  *Topic was postponed. The date shown is not the original due date.    Advanced directives: Please bring a copy of your health care power of attorney and living will to the office to be added to your chart at your convenience.   Conditions/risks identified: None  Next appointment: Follow up in one year for your annual wellness visit.    Preventive Care 42 Years and Older, Male  Preventive care refers to lifestyle choices and visits with your health care provider that can promote health and wellness. What does preventive care include? A yearly physical exam. This is also called an annual well check. Dental exams once or twice a year. Routine eye exams. Ask your health care provider how often you should have your eyes checked. Personal lifestyle choices, including: Daily care of your teeth and gums. Regular physical activity. Eating a healthy diet. Avoiding tobacco and drug use. Limiting alcohol use. Practicing safe sex. Taking low doses of aspirin every day. Taking vitamin and mineral supplements as recommended by your health care provider. What happens  during an annual well check? The services and screenings done by your health care provider during your annual well check will depend on your age, overall health, lifestyle risk factors, and family history of disease. Counseling  Your health care provider may ask you questions about your: Alcohol use. Tobacco use. Drug use. Emotional well-being. Home and relationship well-being. Sexual activity. Eating habits. History of falls. Memory and ability to understand (cognition). Work and work Statistician. Screening  You may have the following tests or measurements: Height, weight, and BMI. Blood pressure. Lipid and cholesterol levels. These may be checked every 5 years, or more frequently if you are over 27 years old. Skin check. Lung cancer screening. You may have this screening every year starting at age 26 if you have a 30-pack-year history of smoking and currently smoke or have quit within the past 15 years. Fecal occult blood test (FOBT) of the stool. You may have this test every year starting at age 3. Flexible sigmoidoscopy or colonoscopy. You may have a sigmoidoscopy every 5 years or a colonoscopy every 10 years starting at age 84. Prostate cancer screening. Recommendations will vary depending on your family history and other risks. Hepatitis C blood test. Hepatitis B blood test. Sexually transmitted disease (STD) testing. Diabetes screening. This is done by checking your blood sugar (glucose) after you have not eaten for a while (fasting). You may have  this done every 1-3 years. Abdominal aortic aneurysm (AAA) screening. You may need this if you are a current or former smoker. Osteoporosis. You may be screened starting at age 33 if you are at high risk. Talk with your health care provider about your test results, treatment options, and if necessary, the need for more tests. Vaccines  Your health care provider may recommend certain vaccines, such as: Influenza vaccine. This is  recommended every year. Tetanus, diphtheria, and acellular pertussis (Tdap, Td) vaccine. You may need a Td booster every 10 years. Zoster vaccine. You may need this after age 28. Pneumococcal 13-valent conjugate (PCV13) vaccine. One dose is recommended after age 45. Pneumococcal polysaccharide (PPSV23) vaccine. One dose is recommended after age 3. Talk to your health care provider about which screenings and vaccines you need and how often you need them. This information is not intended to replace advice given to you by your health care provider. Make sure you discuss any questions you have with your health care provider. Document Released: 11/01/2015 Document Revised: 06/24/2016 Document Reviewed: 08/06/2015 Elsevier Interactive Patient Education  2017 [place] Prevention in the Home Falls can cause injuries. They can happen to people of all ages. There are many things you can do to make your home safe and to help prevent falls. What can I do on the outside of my home? Regularly fix the edges of walkways and driveways and fix any cracks. Remove anything that might make you trip as you walk through a door, such as a raised step or threshold. Trim any bushes or trees on the path to your home. Use bright outdoor lighting. Clear any walking paths of anything that might make someone trip, such as rocks or tools. Regularly check to see if handrails are loose or broken. Make sure that both sides of any steps have handrails. Any raised decks and porches should have guardrails on the edges. Have any leaves, snow, or ice cleared regularly. Use sand or salt on walking paths during winter. Clean up any spills in your garage right away. This includes oil or grease spills. What can I do in the bathroom? Use night lights. Install grab bars by the toilet and in the tub and shower. Do not use towel bars as grab bars. Use non-skid mats or decals in the tub or shower. If you need to sit down in  the shower, use a plastic, non-slip stool. Keep the floor dry. Clean up any water that spills on the floor as soon as it happens. Remove soap buildup in the tub or shower regularly. Attach bath mats securely with double-sided non-slip rug tape. Do not have throw rugs and other things on the floor that can make you trip. What can I do in the bedroom? Use night lights. Make sure that you have a light by your bed that is easy to reach. Do not use any sheets or blankets that are too big for your bed. They should not hang down onto the floor. Have a firm chair that has side arms. You can use this for support while you get dressed. Do not have throw rugs and other things on the floor that can make you trip. What can I do in the kitchen? Clean up any spills right away. Avoid walking on wet floors. Keep items that you use a lot in easy-to-reach places. If you need to reach something above you, use a strong step stool that has a grab bar. Keep electrical cords out  of the way. Do not use floor polish or wax that makes floors slippery. If you must use wax, use non-skid floor wax. Do not have throw rugs and other things on the floor that can make you trip. What can I do with my stairs? Do not leave any items on the stairs. Make sure that there are handrails on both sides of the stairs and use them. Fix handrails that are broken or loose. Make sure that handrails are as long as the stairways. Check any carpeting to make sure that it is firmly attached to the stairs. Fix any carpet that is loose or worn. Avoid having throw rugs at the top or bottom of the stairs. If you do have throw rugs, attach them to the floor with carpet tape. Make sure that you have a light switch at the top of the stairs and the bottom of the stairs. If you do not have them, ask someone to add them for you. What else can I do to help prevent falls? Wear shoes that: Do not have high heels. Have rubber bottoms. Are comfortable  and fit you well. Are closed at the toe. Do not wear sandals. If you use a stepladder: Make sure that it is fully opened. Do not climb a closed stepladder. Make sure that both sides of the stepladder are locked into place. Ask someone to hold it for you, if possible. Clearly mark and make sure that you can see: Any grab bars or handrails. First and last steps. Where the edge of each step is. Use tools that help you move around (mobility aids) if they are needed. These include: Canes. Walkers. Scooters. Crutches. Turn on the lights when you go into a dark area. Replace any light bulbs as soon as they burn out. Set up your furniture so you have a clear path. Avoid moving your furniture around. If any of your floors are uneven, fix them. If there are any pets around you, be aware of where they are. Review your medicines with your doctor. Some medicines can make you feel dizzy. This can increase your chance of falling. Ask your doctor what other things that you can do to help prevent falls. This information is not intended to replace advice given to you by your health care provider. Make sure you discuss any questions you have with your health care provider. Document Released: 08/01/2009 Document Revised: 03/12/2016 Document Reviewed: 11/09/2014 Elsevier Interactive Patient Education  2017 [employer].

## 2022-07-16 NOTE — Telephone Encounter (Signed)
Pt is aware to contact his urologist

## 2022-07-16 NOTE — Progress Notes (Signed)
Subjective:    Patient ID: Brandon Dixon, male    DOB: [birth year], 76 y.o.   MRN: [id number]  HPI 75 year old male who  has a past medical history of ALLERGIC RHINITIS (09/26/2007), BENIGN PROSTATIC HYPERTROPHY (05/27/2007), COLONIC POLYPS, HX OF (05/27/2007), DERMATITIS (08/25/2010), GERD (05/27/2007), HYPERCHOLESTEROLEMIA (09/26/2007), and Peripheral vascular disease ([place]).  He presents to the office today for follow up regarding polymyalgia rheumatica and BPH.  He would like to recheck his sed rate and PSA.  For polymyalgia rheumatica he is taking prednisone 2.5 mg daily.  Not currently experiencing any symptoms Lab Results  Component Value Date   ESRSEDRATE 54 (H) 04/17/2022   BPH and neuromuscular dysfunction of his bladder he is using Flomax 0.8 mg.  He continues to self cath 4-5 times a day.  He does have an appointment at [facility] urology in a month to see if there is any other options out there that can be done. Lab Results  Component Value Date   PSA 1.36 04/17/2022   PSA 1.68 12/05/2021   PSA 3.51 10/08/2021      Review of Systems See HPI   Past Medical History:  Diagnosis Date   ALLERGIC RHINITIS 09/26/2007   BENIGN PROSTATIC HYPERTROPHY 05/27/2007   COLONIC POLYPS, HX OF 05/27/2007   DERMATITIS 08/25/2010   GERD 05/27/2007   HYPERCHOLESTEROLEMIA 09/26/2007   Peripheral vascular disease ([place])     Social History   Socioeconomic History   Marital status: Married    Spouse name: Not on file   Number of children: Not on file   Years of education: Not on file   Highest education level: Some college, no degree  Occupational History   Not on file  Tobacco Use   Smoking status: Former    Types: Cigarettes    Quit date: 10/19/2001    Years since quitting: 20.7   Smokeless tobacco: Never  Vaping Use   Vaping Use: Never used  Substance and Sexual Activity   Alcohol use: Yes    Alcohol/week: 6.0 standard drinks of alcohol    Types: 6 Cans of beer per week   Drug use: No    Sexual activity: Not on file  Other Topics Concern   Not on file  Social History Narrative   Works as a Physicist, medical    Married    Three daughters    Social Determinants of Health   Financial Resource Strain: Low Risk  (07/16/2022)   Overall Financial Resource Strain (CARDIA)    Difficulty of Paying Living Expenses: Not hard at all  Food Insecurity: No Food Insecurity (07/16/2022)   Hunger Vital Sign    Worried About Running Out of Food in the Last Year: Never true    Ran Out of Food in the Last Year: Never true  Transportation Needs: No Transportation Needs (07/16/2022)   PRAPARE - Hydrologist (Medical): No    Lack of Transportation (Non-Medical): No  Physical Activity: Sufficiently Active (07/16/2022)   Exercise Vital Sign    Days of Exercise per Week: 5 days    Minutes of Exercise per Session: 30 min  Stress: No Stress Concern Present (07/16/2022)   [place]    Feeling of Stress : Not at all  Social Connections: [place] (07/16/2022)   Social Connection and Isolation Panel [NHANES]    Frequency of Communication with Friends and Family: More than three times a week  Frequency of Social Gatherings with Friends and Family: More than three times a week    Attends Religious Services: More than 4 times per year    Active Member of Clubs or Organizations: Yes    Attends Archivist Meetings: More than 4 times per year    Marital Status: Married  Human resources officer Violence: Not At Risk (07/16/2022)   Humiliation, Afraid, Rape, and Kick questionnaire    Fear of Current or Ex-Partner: No    Emotionally Abused: No    Physically Abused: No    Sexually Abused: No    Past Surgical History:  Procedure Laterality Date   LOWER EXTREMITY ANGIOGRAM Left 04/30/2014   Procedure: LOWER EXTREMITY ANGIOGRAM;  Surgeon: Angelia Mould, MD;  Location: [facility] CATH LAB;  Service:  Cardiovascular;  Laterality: Left;   ROTATOR CUFF REPAIR     VASECTOMY      Family History  Problem Relation Age of Onset   Heart disease Mother    Heart attack Mother        64    Cancer Father        lung ca   Hypertension Sister    Diabetes Brother    Hypertension Sister    Diabetes Brother    Diabetes Brother    Diabetes Brother    Diabetes Brother    Diabetes Daughter     No Known Allergies  Current Outpatient Medications on File Prior to Visit  Medication Sig Dispense Refill   aspirin EC 81 MG tablet Take 81 mg by mouth daily.     atorvastatin (LIPITOR) 80 MG tablet Take 1 tablet (80 mg total) by mouth every evening. 90 tablet 3   cabergoline (DOSTINEX) 0.5 MG tablet Take 0.25 mg by mouth 2 (two) times a week.     calcium carbonate (OS-CAL) 600 MG TABS tablet Take 600 mg by mouth daily with breakfast.     dutasteride (AVODART) 0.5 MG capsule      fluticasone (FLONASE) 50 MCG/ACT nasal spray PLACE 2 SPRAYS INTO BOTH NOSTRILS DAILY. (Patient taking differently: 2 sprays daily as needed for allergies.) 48 g 4   hydrocortisone 2.5 % ointment Apply topically.     hydrocortisone ointment 0.5 % Apply 1 application topically daily as needed for itching.     olmesartan (BENICAR) 5 MG tablet Take 1 tablet (5 mg total) by mouth daily. 90 tablet 3   predniSONE (DELTASONE) 2.5 MG tablet Take 1 tablet (2.5 mg total) by mouth daily with breakfast. 90 tablet 3   tamsulosin (FLOMAX) 0.4 MG CAPS capsule TAKE 2 CAPSULES (0.8 MG TOTAL) BY MOUTH DAILY AFTER SUPPER. (Patient taking differently: Take 0.4 mg by mouth daily after breakfast.) 180 capsule 0   No current facility-administered medications on file prior to visit.    BP 122/62   Pulse 60   Temp 98.2 F (36.8 C) (Oral)   Ht 6' (1.829 m)   Wt 172 lb (78 kg)   SpO2 95%   BMI 23.33 kg/m       Objective:   Physical Exam Vitals and nursing note reviewed.  Constitutional:      Appearance: Normal appearance.  Cardiovascular:      Rate and Rhythm: Normal rate and regular rhythm.     Pulses: Normal pulses.     Heart sounds: Normal heart sounds.  Pulmonary:     Effort: Pulmonary effort is normal.     Breath sounds: Normal breath sounds.  Musculoskeletal:  General: Normal range of motion.  Skin:    General: Skin is warm and dry.  Neurological:     General: No focal deficit present.     Mental Status: He is alert and oriented to person, place, and time.  Psychiatric:        Mood and Affect: Mood normal.        Behavior: Behavior normal.        Thought Content: Thought content normal.       Assessment & Plan:  1. Polymyalgia rheumatica (HCC) - likely no increase in prednisone at this time  - Sedimentation Rate; Future - Sedimentation Rate  2. Benign prostatic hyperplasia without lower urinary tract symptoms  - PSA; Future - PSA  Dorothyann Peng, NP

## 2022-07-16 NOTE — Progress Notes (Signed)
Subjective:   Brandon Dixon is a 76 y.o. male who presents for Medicare Annual/Subsequent preventive examination.  Review of Systems     Cardiac Risk Factors include: advanced age (>109mn, >>80women);male gender     Objective:    Today's Vitals   07/16/22 1236  BP: 122/62  Pulse: 60  Temp: 98.2 F (36.8 C)  TempSrc: Oral  SpO2: 95%  Weight: 172 lb 14.4 oz (78.4 kg)  Height: 6' (1.829 m)   Body mass index is 23.45 kg/m.     07/16/2022   12:50 PM 07/12/2021   11:54 AM 06/26/2020   11:29 AM 01/12/2018    2:53 PM 05/23/2014    2:10 PM 04/30/2014    8:35 AM  Advanced Directives  Does Patient Have a Medical Advance Directive? Yes Yes Yes Yes Patient has advance directive, copy in chart Patient has advance directive, copy not in chart  Type of Advance Directive [place] Living will;Healthcare Power of Attorney  Living will;Healthcare Power of Attorney  Does patient want to make changes to medical advance directive?  Yes (Inpatient - patient defers changing a medical advance directive and declines information at this time) No - Patient declined   No change requested  Copy of HClyde Hillin Chart? No - copy requested Yes - validated most recent copy scanned in chart (See row information) No - copy requested       Current Medications (verified) Outpatient Encounter Medications as of 07/16/2022  Medication Sig   aspirin EC 81 MG tablet Take 81 mg by mouth daily.   atorvastatin (LIPITOR) 80 MG tablet Take 1 tablet (80 mg total) by mouth every evening.   cabergoline (DOSTINEX) 0.5 MG tablet Take 0.25 mg by mouth 2 (two) times a week. (Patient not taking: Reported on 04/17/2022)   calcium carbonate (OS-CAL) 600 MG TABS tablet Take 600 mg by mouth daily with breakfast.   dutasteride (AVODART) 0.5 MG capsule    fluticasone (FLONASE) 50 MCG/ACT nasal spray PLACE  2 SPRAYS INTO BOTH NOSTRILS DAILY. (Patient taking differently: 2 sprays daily as needed for allergies.)   hydrocortisone 2.5 % ointment Apply topically.   hydrocortisone ointment 0.5 % Apply 1 application topically daily as needed for itching.   olmesartan (BENICAR) 5 MG tablet Take 1 tablet (5 mg total) by mouth daily.   predniSONE (DELTASONE) 2.5 MG tablet Take 1 tablet (2.5 mg total) by mouth daily with breakfast.   tamsulosin (FLOMAX) 0.4 MG CAPS capsule TAKE 2 CAPSULES (0.8 MG TOTAL) BY MOUTH DAILY AFTER SUPPER. (Patient taking differently: Take 0.4 mg by mouth daily after breakfast.)   No facility-administered encounter medications on file as of 07/16/2022.    Allergies (verified) Patient has no known allergies.   History: Past Medical History:  Diagnosis Date   ALLERGIC RHINITIS 09/26/2007   BENIGN PROSTATIC HYPERTROPHY 05/27/2007   COLONIC POLYPS, HX OF 05/27/2007   DERMATITIS 08/25/2010   GERD 05/27/2007   HYPERCHOLESTEROLEMIA 09/26/2007   Peripheral vascular disease ([facility]    Past Surgical History:  Procedure Laterality Date   LOWER EXTREMITY ANGIOGRAM Left 04/30/2014   Procedure: LOWER EXTREMITY ANGIOGRAM;  Surgeon: CAngelia Mould MD;  [facility];  Service: Cardiovascular;  Laterality: Left;   ROTATOR CUFF REPAIR     VASECTOMY     Family History  Problem Relation Age of Onset   Heart disease Mother    Heart  attack Mother        20    Cancer Father        lung ca   Hypertension Sister    Diabetes Brother    Hypertension Sister    Diabetes Brother    Diabetes Brother    Diabetes Brother    Diabetes Brother    Diabetes Daughter    Social History   Socioeconomic History   Marital status: Married    Spouse name: Not on file   Number of children: Not on file   Years of education: Not on file   Highest education level: Some college, no degree  Occupational History   Not on file  Tobacco Use   Smoking status: Former    Types: Cigarettes    Quit  date: 10/19/2001    Years since quitting: 20.7   Smokeless tobacco: Never  Vaping Use   Vaping Use: Never used  Substance and Sexual Activity   Alcohol use: Yes    Alcohol/week: 6.0 standard drinks of alcohol    Types: 6 Cans of beer per week   Drug use: No   Sexual activity: Not on file  Other Topics Concern   Not on file  Social History Narrative   Works as a Physicist, medical    Married    Three daughters    Social Determinants of Health   Financial Resource Strain: Low Risk  (07/16/2022)   Overall Financial Resource Strain (CARDIA)    Difficulty of Paying Living Expenses: Not hard at all  Food Insecurity: No Food Insecurity (07/16/2022)   Hunger Vital Sign    Worried About Running Out of Food in the Last Year: Never true    Ran Out of Food in the Last Year: Never true  Transportation Needs: No Transportation Needs (07/16/2022)   PRAPARE - Hydrologist (Medical): No    Lack of Transportation (Non-Medical): No  Physical Activity: Sufficiently Active (07/16/2022)   Exercise Vital Sign    Days of Exercise per Week: 5 days    Minutes of Exercise per Session: 30 min  Stress: No Stress Concern Present (07/16/2022)   [place]    Feeling of Stress : Not at all  Social Connections: [place] (07/16/2022)   Social Connection and Isolation Panel [NHANES]    Frequency of Communication with Friends and Family: More than three times a week    Frequency of Social Gatherings with Friends and Family: More than three times a week    Attends Religious Services: More than 4 times per year    Active Member of [employer] or Organizations: Yes    Attends Music therapist: More than 4 times per year    Marital Status: Married    Tobacco Counseling Counseling given: Not Answered   Clinical Intake:  Pre-visit preparation completed: No  Pain : No/denies pain     BMI - recorded:  23.45 Nutritional Status: BMI of 19-24  Normal Nutritional Risks: None Diabetes: No  How often do you need to have someone help you when you read instructions, pamphlets, or other written materials from your doctor or pharmacy?: 1 - Never  Diabetic?  No  Interpreter Needed?: No  Information entered by :: Rolene Arbour LPN   Activities of Daily Living    07/16/2022   12:47 PM 12/05/2021   10:05 AM  In your present state of health, do you have any difficulty performing  the following activities:  Hearing? 0 1  Vision? 0 0  Difficulty concentrating or making decisions? 0 1  Walking or climbing stairs? 0 0  Dressing or bathing? 0 0  Doing errands, shopping? 0 0  Preparing Food and eating ? N   Using the Toilet? N   In the past six months, have you accidently leaked urine? N   Do you have problems with loss of bowel control? N   Managing your Medications? N   Managing your Finances? N   Housekeeping or managing your Housekeeping? N     Patient Care Team: Dorothyann Peng, NP as PCP - General (Family Medicine) Juanita Craver, MD as Consulting Physician (Gastroenterology) Viona Gilmore, [facility] as Pharmacist (Pharmacist)  Indicate any recent Medical Services you may have received from other than [facility] providers in the past year (date may be approximate).     Assessment:   This is a routine wellness examination for Charle.  Hearing/Vision screen Hearing Screening - Comments:: Denies hearing difficulties   Vision Screening - Comments:: Wears reading glasses - up to date with routine eye exams with Dr Nicki Reaper    Dietary issues and exercise activities discussed: Current Exercise Habits: Home exercise routine, Type of exercise: walking, Time (Minutes): 30, Frequency (Times/Week): 5, Weekly Exercise (Minutes/Week): 150, Intensity: Moderate, Exercise limited by: None identified   Goals Addressed               This Visit's Progress     Stay healthy (pt-stated)        Increase  activity.       Depression Screen    07/16/2022   12:45 PM 12/05/2021   10:04 AM 07/12/2021   11:52 AM 12/04/2020   10:02 AM 06/26/2020   11:31 AM 12/01/2019   10:05 AM 11/29/2018   10:13 AM  PHQ 2/9 Scores  PHQ - 2 Score 0 0 0 0 0 0 0  PHQ- 9 Score     0      Fall Risk    07/16/2022   12:48 PM 04/16/2022   12:19 PM 12/05/2021   10:04 AM 07/12/2021   11:52 AM 12/04/2020   10:02 AM  Fall Risk   Falls in the past year? 0 0 0 0 0  Number falls in past yr: 0  0    Injury with Fall? 0  0 0   Risk for fall due to : No Fall Risks      Follow up Falls prevention discussed        FALL RISK PREVENTION PERTAINING TO THE HOME:  Any stairs in or around the home? Yes  If so, are there any without handrails? No  Home free of loose throw rugs in walkways, pet beds, electrical cords, etc? Yes  Adequate lighting in your home to reduce risk of falls? Yes   ASSISTIVE DEVICES UTILIZED TO PREVENT FALLS:  Life alert? No  Use of a cane, walker or w/c? No  Grab bars in the bathroom? No  Shower chair or bench in shower? No  Elevated toilet seat or a handicapped toilet? No   TIMED UP AND GO:  Was the test performed? Yes .  Length of time to ambulate 10 feet: 10 sec.   Gait steady and fast without use of assistive device  Cognitive Function:        07/16/2022   12:50 PM 07/12/2021   11:53 AM 06/26/2020   11:36 AM  6CIT Screen  What Year? 0 points  0 points 0 points  What month? 0 points 0 points 0 points  What time? 0 points 0 points 0 points  Count back from 20 0 points 0 points 0 points  Months in reverse 0 points 0 points 0 points  Repeat phrase 0 points 0 points 2 points  Total Score 0 points 0 points 2 points    Immunizations Immunization History  Administered Date(s) Administered   Influenza Split 08/30/2013   Influenza Whole 07/30/2010   Influenza, High Dose Seasonal PF 07/20/2014, 07/02/2015, 07/15/2016, 07/04/2019, 07/28/2021   Influenza-Unspecified 08/05/2017, 07/22/2018,  08/19/2020   PFIZER Comirnaty(Gray Top)Covid-19 Tri-Sucrose Vaccine 07/28/2021   PFIZER(Purple Top)SARS-COV-2 Vaccination 12/01/2019, 12/26/2019, 07/19/2020, 02/09/2021   Pneumococcal Conjugate-13 11/14/2013, 07/22/2018   Pneumococcal Polysaccharide-23 10/23/2011, 07/22/2018, 07/04/2019   Tdap 03/09/2018   Zoster Recombinat (Shingrix) 11/22/2020, 02/09/2021    TDAP status: Up to date  Flu Vaccine status: Up to date  Pneumococcal vaccine status: Completed during today's visit.  Covid-19 vaccine status: Completed vaccines  Qualifies for Shingles Vaccine? Yes   Zostavax completed Yes   Shingrix Completed?: Yes  Screening Tests Health Maintenance  Topic Date Due   COVID-19 Vaccine (6 - Pfizer risk series) 08/01/2022 (Originally 09/22/2021)   INFLUENZA VACCINE  01/17/2023 (Originally 05/19/2022)   TETANUS/TDAP  03/09/2028   Pneumonia Vaccine 14+ Years old  Completed   Hepatitis C Screening  Completed   Zoster Vaccines- Shingrix  Completed   HPV VACCINES  Aged Out   COLONOSCOPY (Pts 45-70yr Insurance coverage will need to be confirmed)  Discontinued    Health Maintenance  There are no preventive care reminders to display for this patient.     Lung Cancer Screening: (Low Dose CT Chest recommended if Age 76-80years, 30 pack-year currently smoking OR have quit w/in 15years.) does not qualify.     Additional Screening:  Hepatitis C Screening: does qualify; Completed 11/19/15  Vision Screening: Recommended annual ophthalmology exams for early detection of glaucoma and other disorders of the eye. Is the patient up to date with their annual eye exam?  Yes  Who is the provider or what is the name of the office in which the patient attends annual eye exams? Dr SNicki ReaperIf pt is not established with a provider, would they like to be referred to a provider to establish care? No .   Dental Screening: Recommended annual dental exams for proper oral hygiene  Community Resource Referral  / Chronic Care Management:  CRR required this visit?  No   CCM required this visit?  No      Plan:     I have personally reviewed and noted the following in the patient's chart:   Medical and social history Use of alcohol, tobacco or illicit drugs  Current medications and supplements including opioid prescriptions. Patient is not currently taking opioid prescriptions. Functional ability and status Nutritional status Physical activity Advanced directives List of other physicians Hospitalizations, surgeries, and ER visits in previous 12 months Vitals Screenings to include cognitive, depression, and falls Referrals and appointments  In addition, I have reviewed and discussed with patient certain preventive protocols, quality metrics, and best practice recommendations. A written personalized care plan for preventive services as well as general preventive health recommendations were provided to patient.     BCriselda Peaches LPN   [id number]  Nurse Notes: None

## 2022-08-12 DIAGNOSIS — N138 Other obstructive and reflux uropathy: Secondary | ICD-10-CM | POA: Diagnosis not present

## 2022-08-12 DIAGNOSIS — N401 Enlarged prostate with lower urinary tract symptoms: Secondary | ICD-10-CM | POA: Diagnosis not present

## 2022-08-12 DIAGNOSIS — Z23 Encounter for immunization: Secondary | ICD-10-CM | POA: Diagnosis not present

## 2022-08-12 DIAGNOSIS — Z87440 Personal history of urinary (tract) infections: Secondary | ICD-10-CM | POA: Diagnosis not present

## 2022-08-12 DIAGNOSIS — R338 Other retention of urine: Secondary | ICD-10-CM | POA: Diagnosis not present

## 2022-08-12 DIAGNOSIS — R339 Retention of urine, unspecified: Secondary | ICD-10-CM | POA: Diagnosis not present

## 2022-08-12 DIAGNOSIS — Z7722 Contact with and (suspected) exposure to environmental tobacco smoke (acute) (chronic): Secondary | ICD-10-CM | POA: Diagnosis not present

## 2022-08-12 DIAGNOSIS — N319 Neuromuscular dysfunction of bladder, unspecified: Secondary | ICD-10-CM | POA: Diagnosis not present

## 2022-09-08 DIAGNOSIS — R339 Retention of urine, unspecified: Secondary | ICD-10-CM | POA: Diagnosis not present

## 2022-09-23 ENCOUNTER — Other Ambulatory Visit: Payer: Self-pay | Admitting: Adult Health

## 2022-09-23 DIAGNOSIS — N400 Enlarged prostate without lower urinary tract symptoms: Secondary | ICD-10-CM

## 2022-09-24 ENCOUNTER — Telehealth: Payer: Self-pay | Admitting: Pharmacist

## 2022-09-24 NOTE — Chronic Care Management (AMB) (Addendum)
    Chronic Care Management Pharmacy Assistant   Name: Brandon Dixon  MRN: [id number] DOB: [birth year]  Done in error.

## 2022-09-25 ENCOUNTER — Telehealth: Payer: Medicare HMO

## 2022-10-22 DIAGNOSIS — R339 Retention of urine, unspecified: Secondary | ICD-10-CM | POA: Diagnosis not present

## 2022-10-22 DIAGNOSIS — N138 Other obstructive and reflux uropathy: Secondary | ICD-10-CM | POA: Diagnosis not present

## 2022-10-22 DIAGNOSIS — N401 Enlarged prostate with lower urinary tract symptoms: Secondary | ICD-10-CM | POA: Diagnosis not present

## 2022-11-04 DIAGNOSIS — H52223 Regular astigmatism, bilateral: Secondary | ICD-10-CM | POA: Diagnosis not present

## 2022-11-10 DIAGNOSIS — N3289 Other specified disorders of bladder: Secondary | ICD-10-CM | POA: Diagnosis not present

## 2022-11-10 DIAGNOSIS — N403 Nodular prostate with lower urinary tract symptoms: Secondary | ICD-10-CM | POA: Diagnosis not present

## 2022-11-10 DIAGNOSIS — Z79899 Other long term (current) drug therapy: Secondary | ICD-10-CM | POA: Diagnosis not present

## 2022-11-10 DIAGNOSIS — N411 Chronic prostatitis: Secondary | ICD-10-CM | POA: Diagnosis not present

## 2022-11-10 DIAGNOSIS — N400 Enlarged prostate without lower urinary tract symptoms: Secondary | ICD-10-CM | POA: Diagnosis not present

## 2022-11-10 DIAGNOSIS — M353 Polymyalgia rheumatica: Secondary | ICD-10-CM | POA: Diagnosis not present

## 2022-11-10 DIAGNOSIS — Z87891 Personal history of nicotine dependence: Secondary | ICD-10-CM | POA: Diagnosis not present

## 2022-11-10 DIAGNOSIS — E7800 Pure hypercholesterolemia, unspecified: Secondary | ICD-10-CM | POA: Diagnosis not present

## 2022-11-10 DIAGNOSIS — R338 Other retention of urine: Secondary | ICD-10-CM | POA: Diagnosis not present

## 2022-11-10 DIAGNOSIS — N138 Other obstructive and reflux uropathy: Secondary | ICD-10-CM | POA: Diagnosis not present

## 2022-11-10 DIAGNOSIS — N401 Enlarged prostate with lower urinary tract symptoms: Secondary | ICD-10-CM | POA: Diagnosis not present

## 2022-11-10 HISTORY — PX: CYSTOSCOPY: SUR368

## 2022-11-11 DIAGNOSIS — Z79899 Other long term (current) drug therapy: Secondary | ICD-10-CM | POA: Diagnosis not present

## 2022-11-11 DIAGNOSIS — R338 Other retention of urine: Secondary | ICD-10-CM | POA: Diagnosis not present

## 2022-11-11 DIAGNOSIS — N401 Enlarged prostate with lower urinary tract symptoms: Secondary | ICD-10-CM | POA: Diagnosis not present

## 2022-11-11 DIAGNOSIS — Z87891 Personal history of nicotine dependence: Secondary | ICD-10-CM | POA: Diagnosis not present

## 2022-11-11 DIAGNOSIS — N3289 Other specified disorders of bladder: Secondary | ICD-10-CM | POA: Diagnosis not present

## 2022-11-11 DIAGNOSIS — N411 Chronic prostatitis: Secondary | ICD-10-CM | POA: Diagnosis not present

## 2022-11-11 DIAGNOSIS — M353 Polymyalgia rheumatica: Secondary | ICD-10-CM | POA: Diagnosis not present

## 2022-11-11 DIAGNOSIS — E7800 Pure hypercholesterolemia, unspecified: Secondary | ICD-10-CM | POA: Diagnosis not present

## 2022-11-11 DIAGNOSIS — N138 Other obstructive and reflux uropathy: Secondary | ICD-10-CM | POA: Diagnosis not present

## 2022-11-11 NOTE — Discharge Summary (Addendum)
 Discharge to Outside Facility: RN  Report called to *** by *** at *** Discharge Unit: [id number] Discharge Unit Phone #: ***  Transport Equipment/Life Support Measures Needed    IV Access:                       Oxygen: via O2 Device: Simple mask (11/10/22 0952)   Pulse Oximeter: SpO2: 98 % (11/11/22 0721)    Drains/Tubes:          Precautions/Positioning:    Vital Signs and Patient Condition At Time of Transfer    Vitals:     Vitals:   11/11/22 0721  BP: (!) 149/75  Pulse: 70  Resp: 16  Temp: 36.4 C (97.6 F)     Mental Status:   (WDL = Within Defined Limits)           Orientation Level: Oriented X4 (11/11/22 0810)   Cognition: Appropriate safety awareness, Appropriate judgement, Appropriate attention/concentration (11/11/22 0810)   Communication: Verbal (11/11/22 0810)   Pain:    Pain Assessment %%: No/denies pain (11/11/22 0714)    Pain Score %%: 0-No pain (11/10/22 1955)       Nursing Summary    Activities of Daily Living:        Mobility: No limitation (11/11/22 0810)   Activity: Walks frequently (11/11/22 0810)   Sensory Aides:            Elimination:           Urinary Incontinence: No (11/11/22 0810)   Bowel Incontinence: No (11/11/22 0810)   Nutrition:   Does the pt require assistance with eating?: Able to feed self (11/11/22 0714)           Skin Condition / Wounds:     Wound 11/10/22 Urethra (Active)  Wound Assessment Dressing change not due 11/11/22 0810  Peri-wound Assessment Clean, Dry, Intact 11/10/22 1955  Drainage Amount None 11/10/22 1129  Dressing Type Gauze 11/10/22 1955  Dressing Status/ Intervention Clean, Dry, Intact 11/10/22 1955  Number of days: 1    (RN to validate provider order for wound care)  Signed By: ROSINA SPALDING

## 2022-11-18 ENCOUNTER — Telehealth (INDEPENDENT_AMBULATORY_CARE_PROVIDER_SITE_OTHER): Payer: Medicare HMO | Admitting: Adult Health

## 2022-11-18 DIAGNOSIS — U071 COVID-19: Secondary | ICD-10-CM

## 2022-11-18 MED ORDER — NIRMATRELVIR/RITONAVIR (PAXLOVID)TABLET: 3.0000 | ORAL_TABLET | Freq: Two times a day (BID) | ORAL | 0 refills | Status: AC

## 2022-11-18 NOTE — Progress Notes (Signed)
Virtual Visit via Video Note  I connected with Brandon Dixon on 11/18/22 at  4:00 PM EST by a video enabled telemedicine application and verified that I am speaking with the correct person using two identifiers.  Location patient: home Location provider:work or home office Persons participating in the virtual visit: patient, provider  I discussed the limitations of evaluation and management by telemedicine and the availability of in person appointments. The patient expressed understanding and agreed to proceed.   HPI: 77 year old male who  has a past medical history of ALLERGIC RHINITIS (09/26/2007), BENIGN PROSTATIC HYPERTROPHY (05/27/2007), COLONIC POLYPS, HX OF (05/27/2007), DERMATITIS (08/25/2010), GERD (05/27/2007), HYPERCHOLESTEROLEMIA (09/26/2007), and Peripheral vascular disease ([place]).  He is being evaluated today for concern of Covid 19 infection. His symptoms started two days ago. Symptoms include rhinorrhea, coughing, sore throat and diarrhea ( improving). He took a covid test yesterday and it was negative. He does not have any more covid tests available. His wife was diagnosed with Covid 19 last week and is being treated with Paxlovid     ROS: See pertinent positives and negatives per HPI.  Past Medical History:  Diagnosis Date   ALLERGIC RHINITIS 09/26/2007   BENIGN PROSTATIC HYPERTROPHY 05/27/2007   COLONIC POLYPS, HX OF 05/27/2007   DERMATITIS 08/25/2010   GERD 05/27/2007   HYPERCHOLESTEROLEMIA 09/26/2007   Peripheral vascular disease [facility])     Past Surgical History:  Procedure Laterality Date   LOWER EXTREMITY ANGIOGRAM Left 04/30/2014   Procedure: LOWER EXTREMITY ANGIOGRAM;  Surgeon: Angelia Mould, MD;  Location: [facility] LAB;  Service: Cardiovascular;  Laterality: Left;   ROTATOR CUFF REPAIR     VASECTOMY      Family History  Problem Relation Age of Onset   Heart disease Mother    Heart attack Mother        54    Cancer Father        lung ca   Hypertension Sister     Diabetes Brother    Hypertension Sister    Diabetes Brother    Diabetes Brother    Diabetes Brother    Diabetes Brother    Diabetes Daughter        Current Outpatient Medications:    aspirin EC 81 MG tablet, Take 81 mg by mouth daily., Disp: , Rfl:    atorvastatin (LIPITOR) 80 MG tablet, Take 1 tablet (80 mg total) by mouth every evening., Disp: 90 tablet, Rfl: 3   cabergoline (DOSTINEX) 0.5 MG tablet, Take 0.25 mg by mouth 2 (two) times a week., Disp: , Rfl:    calcium carbonate (OS-CAL) 600 MG TABS tablet, Take 600 mg by mouth daily with breakfast., Disp: , Rfl:    dutasteride (AVODART) 0.5 MG capsule, , Disp: , Rfl:    fluticasone (FLONASE) 50 MCG/ACT nasal spray, PLACE 2 SPRAYS INTO BOTH NOSTRILS DAILY. (Patient taking differently: 2 sprays daily as needed for allergies.), Disp: 48 g, Rfl: 4   hydrocortisone 2.5 % ointment, Apply topically., Disp: , Rfl:    hydrocortisone ointment 0.5 %, Apply 1 application topically daily as needed for itching., Disp: , Rfl:    olmesartan (BENICAR) 5 MG tablet, Take 1 tablet (5 mg total) by mouth daily., Disp: 90 tablet, Rfl: 3   predniSONE (DELTASONE) 2.5 MG tablet, Take 1 tablet (2.5 mg total) by mouth daily with breakfast., Disp: 90 tablet, Rfl: 3   tamsulosin (FLOMAX) 0.4 MG CAPS capsule, TAKE 2 CAPSULES EVERY DAY AFTER SUPPER, Disp: 180 capsule, Rfl:  3  EXAM:  VITALS per patient if applicable:  GENERAL: alert, oriented, appears well and in no acute distress  HEENT: atraumatic, conjunttiva clear, no obvious abnormalities on inspection of external nose and ears  NECK: normal movements of the head and neck  LUNGS: on inspection no signs of respiratory distress, breathing rate appears normal, no obvious gross SOB, gasping or wheezing  CV: no obvious cyanosis  MS: moves all visible extremities without noticeable abnormality  PSYCH/NEURO: pleasant and cooperative, no obvious depression or anxiety, speech and thought processing grossly  intact  ASSESSMENT AND PLAN:  Discussed the following assessment and plan:  1. COVID-19 virus infection - likely tested too early and has covid 19. Will treat with Paxlovid  - advised not to take statin while taking Paxlovid  - nirmatrelvir/ritonavir (PAXLOVID) 20 x 150 MG & 10 x '100MG'$  TABS; Take 3 tablets by mouth 2 (two) times daily for 5 days. (Take nirmatrelvir 150 mg two tablets twice daily for 5 days and ritonavir 100 mg one tablet twice daily for 5 days) Patient GFR is 89  Dispense: 30 tablet; Refill: 0 - Follow up in office if symptoms are not improving in the next 4-5 days       I discussed the assessment and treatment plan with the patient. The patient was provided an opportunity to ask questions and all were answered. The patient agreed with the plan and demonstrated an understanding of the instructions.   The patient was advised to call back or seek an in-person evaluation if the symptoms worsen or if the condition fails to improve as anticipated.   Dorothyann Peng, NP

## 2022-12-08 ENCOUNTER — Ambulatory Visit (INDEPENDENT_AMBULATORY_CARE_PROVIDER_SITE_OTHER): Payer: Medicare HMO | Admitting: Adult Health

## 2022-12-08 VITALS — BP 120/70 | HR 70 | Temp 97.7°F | Ht 71.5 in | Wt 177.0 lb

## 2022-12-08 DIAGNOSIS — E7800 Pure hypercholesterolemia, unspecified: Secondary | ICD-10-CM

## 2022-12-08 DIAGNOSIS — I739 Peripheral vascular disease, unspecified: Secondary | ICD-10-CM | POA: Diagnosis not present

## 2022-12-08 DIAGNOSIS — M353 Polymyalgia rheumatica: Secondary | ICD-10-CM

## 2022-12-08 DIAGNOSIS — Z0000 Encounter for general adult medical examination without abnormal findings: Secondary | ICD-10-CM

## 2022-12-08 DIAGNOSIS — N521 Erectile dysfunction due to diseases classified elsewhere: Secondary | ICD-10-CM

## 2022-12-08 DIAGNOSIS — N400 Enlarged prostate without lower urinary tract symptoms: Secondary | ICD-10-CM | POA: Diagnosis not present

## 2022-12-08 DIAGNOSIS — I10 Essential (primary) hypertension: Secondary | ICD-10-CM

## 2022-12-08 DIAGNOSIS — E221 Hyperprolactinemia: Secondary | ICD-10-CM

## 2022-12-08 LAB — PSA: PSA: 0.38 ng/mL (ref 0.10–4.00)

## 2022-12-08 LAB — CBC
HCT: 46.5 % (ref 39.0–52.0)
Hemoglobin: 15.3 g/dL (ref 13.0–17.0)
MCHC: 33 g/dL (ref 30.0–36.0)
MCV: 89.3 fl (ref 78.0–100.0)
Platelets: 235 10*3/uL (ref 150.0–400.0)
RBC: 5.21 Mil/uL (ref 4.22–5.81)
RDW: 14.5 % (ref 11.5–15.5)
WBC: 9 10*3/uL (ref 4.0–10.5)

## 2022-12-08 LAB — SEDIMENTATION RATE: Sed Rate: 23 mm/hr — ABNORMAL HIGH (ref 0–20)

## 2022-12-08 LAB — LIPID PANEL
Cholesterol: 173 mg/dL (ref 0–200)
HDL: 67.5 mg/dL (ref >39.00)
LDL Cholesterol: 89 mg/dL (ref 0–99)
NonHDL: 105.13
Total CHOL/HDL Ratio: 3
Triglycerides: 83 mg/dL (ref 0.0–149.0)
VLDL: 16.6 mg/dL (ref 0.0–40.0)

## 2022-12-08 LAB — COMPREHENSIVE METABOLIC PANEL
ALT: 29 U/L (ref 0–53)
AST: 30 U/L (ref 0–37)
Albumin: 4.2 g/dL (ref 3.5–5.2)
Alkaline Phosphatase: 107 U/L (ref 39–117)
BUN: 19 mg/dL (ref 6–23)
CO2: 30 mEq/L (ref 19–32)
Calcium: 10 mg/dL (ref 8.4–10.5)
Chloride: 99 mEq/L (ref 96–112)
Creatinine, Ser: 1 mg/dL (ref 0.40–1.50)
GFR: 73.04 mL/min (ref >60.00)
Glucose, Bld: 81 mg/dL (ref 70–99)
Potassium: 4.5 mEq/L (ref 3.5–5.1)
Sodium: 138 mEq/L (ref 135–145)
Total Bilirubin: 0.6 mg/dL (ref 0.2–1.2)
Total Protein: 7.7 g/dL (ref 6.0–8.3)

## 2022-12-08 LAB — TSH: TSH: 1.86 u[IU]/mL (ref 0.35–5.50)

## 2022-12-08 LAB — TESTOSTERONE: Testosterone: 290.59 ng/dL — ABNORMAL LOW (ref 300.00–890.00)

## 2022-12-08 MED ORDER — TADALAFIL 20 MG PO TABS: 10.0000 mg | ORAL_TABLET | ORAL | 11 refills | Status: DC | PRN

## 2022-12-08 NOTE — Progress Notes (Signed)
Subjective:    Patient ID: Brandon Dixon, male    DOB: [birth year], 77 y.o.   MRN: [id number]  HPI Patient presents for yearly preventative medicine examination. He is a pleasant 77 year old male who  has a past medical history of ALLERGIC RHINITIS (09/26/2007), BENIGN PROSTATIC HYPERTROPHY (05/27/2007), COLONIC POLYPS, HX OF (05/27/2007), DERMATITIS (08/25/2010), GERD (05/27/2007), HYPERCHOLESTEROLEMIA (09/26/2007), and Peripheral vascular disease ([place]).  Hyperlipidemia- Lipitor 80 mg.  He denies myalgia or fatigue. Lab Results  Component Value Date   CHOL 181 12/05/2021   HDL 75.90 12/05/2021   LDLCALC 96 12/05/2021   LDLDIRECT 193.1 09/22/2006   TRIG 47.0 12/05/2021   CHOLHDL 2 12/05/2021    PVD-  is followed by vascular surgery on yearly basis due to peripheral vascular disease in the left leg.  He denies any complaints today  Polymyalgia rheumatica-is currently prescribed prednisone 2.5 mg daily.  He did have a bone density screen done in June 2022 which was normal. Lab Results  Component Value Date   ESRSEDRATE 23 (H) 07/16/2022    Hypertension-  is managed with Benicar 5 mg daily.  He denies dizziness, lightheadedness, chest pain, shortness of breath BP Readings from Last 3 Encounters:  12/08/22 120/70  07/16/22 122/62  07/16/22 122/62   BPH/LUTS/urinary retention-managed by urology.  He developed acute urinary retention in November 2022.  Foley cath was placed with 1.5 L of urine.  He was also treated at this time for concurrent UTI E. coli infection.  He failed his void trial in 09/2021.  Cystoscopy in January 2023 showed a relatively short prostatic urethra with moderately obstructing bilobar slight elevation of his bladder neck.  Significant trabueculation large bladder capacity. He underwent holmium laser enucleation of the prostate on 11/10/2022. He reports that the surgery went well today and he is only having minimal incontinence. He does report some ED issues after the surgery  and is interested in medication.   Hyperprolactinemia- prescribed Dostinex by Urology. He takes 0.25 mg twice weekly. He would like to be seen by Endocrinology instead of Urology.     All immunizations and health maintenance protocols were reviewed with the patient and needed orders were placed.  Appropriate screening laboratory values were ordered for the patient including screening of hyperlipidemia, renal function and hepatic function. If indicated by BPH, a PSA was ordered.  Medication reconciliation,  past medical history, social history, problem list and allergies were reviewed in detail with the patient  Goals were established with regard to weight loss, exercise, and  diet in compliance with medications Wt Readings from Last 3 Encounters:  12/08/22 177 lb (80.3 kg)  07/16/22 172 lb (78 kg)  07/16/22 172 lb 14.4 oz (78.4 kg)    Review of Systems  Constitutional: Negative.   HENT: Negative.    Eyes: Negative.   Respiratory: Negative.    Cardiovascular: Negative.   Gastrointestinal: Negative.   Endocrine: Negative.   Genitourinary: Negative.   Musculoskeletal: Negative.   Skin: Negative.   Allergic/Immunologic: Negative.   Neurological: Negative.   Hematological: Negative.   Psychiatric/Behavioral: Negative.    All other systems reviewed and are negative.  Past Medical History:  Diagnosis Date   ALLERGIC RHINITIS 09/26/2007   BENIGN PROSTATIC HYPERTROPHY 05/27/2007   COLONIC POLYPS, HX OF 05/27/2007   DERMATITIS 08/25/2010   GERD 05/27/2007   HYPERCHOLESTEROLEMIA 09/26/2007   Peripheral vascular disease ([place])     Social History   Socioeconomic History   Marital status: Married  Spouse name: Not on file   Number of children: Not on file   Years of education: Not on file   Highest education level: Some college, no degree  Occupational History   Not on file  Tobacco Use   Smoking status: Former    Types: Cigarettes    Quit date: 10/19/2001    Years since  quitting: 21.1   Smokeless tobacco: Never  Vaping Use   Vaping Use: Never used  Substance and Sexual Activity   Alcohol use: Yes    Alcohol/week: 6.0 standard drinks of alcohol    Types: 6 Cans of beer per week   Drug use: No   Sexual activity: Not on file  Other Topics Concern   Not on file  Social History Narrative   Works as a Physicist, medical    Married    Three daughters    Social Determinants of Health   Financial Resource Strain: Low Risk  (07/16/2022)   Overall Financial Resource Strain (CARDIA)    Difficulty of Paying Living Expenses: Not hard at all  Food Insecurity: No Food Insecurity (07/16/2022)   Hunger Vital Sign    Worried About Running Out of Food in the Last Year: Never true    Ran Out of Food in the Last Year: Never true  Transportation Needs: No Transportation Needs (07/16/2022)   PRAPARE - Hydrologist (Medical): No    Lack of Transportation (Non-Medical): No  Physical Activity: Sufficiently Active (07/16/2022)   Exercise Vital Sign    Days of Exercise per Week: 5 days    Minutes of Exercise per Session: 30 min  Stress: No Stress Concern Present (07/16/2022)   [place]    Feeling of Stress : Not at all  Social Connections: [place] (07/16/2022)   Social Connection and Isolation Panel [NHANES]    Frequency of Communication with Friends and Family: More than three times a week    Frequency of Social Gatherings with Friends and Family: More than three times a week    Attends Religious Services: More than 4 times per year    Active Member of Clubs or Organizations: Yes    Attends Archivist Meetings: More than 4 times per year    Marital Status: Married  Human resources officer Violence: Not At Risk (07/16/2022)   Humiliation, Afraid, Rape, and Kick questionnaire    Fear of Current or Ex-Partner: No    Emotionally Abused: No    Physically Abused: No     Sexually Abused: No    Past Surgical History:  Procedure Laterality Date   LOWER EXTREMITY ANGIOGRAM Left 04/30/2014   Procedure: LOWER EXTREMITY ANGIOGRAM;  Surgeon: Angelia Mould, MD;  Location: [facility] CATH LAB;  Service: Cardiovascular;  Laterality: Left;   ROTATOR CUFF REPAIR     VASECTOMY      Family History  Problem Relation Age of Onset   Heart disease Mother    Heart attack Mother        16    Cancer Father        lung ca   Hypertension Sister    Diabetes Brother    Hypertension Sister    Diabetes Brother    Diabetes Brother    Diabetes Brother    Diabetes Brother    Diabetes Daughter     No Known Allergies  Current Outpatient Medications on File Prior to Visit  Medication Sig Dispense Refill  aspirin EC 81 MG tablet Take 81 mg by mouth daily.     atorvastatin (LIPITOR) 80 MG tablet Take 1 tablet (80 mg total) by mouth every evening. 90 tablet 3   cabergoline (DOSTINEX) 0.5 MG tablet Take 0.25 mg by mouth 2 (two) times a week.     calcium carbonate (OS-CAL) 600 MG TABS tablet Take 600 mg by mouth daily with breakfast.     hydrocortisone ointment 0.5 % Apply 1 application topically daily as needed for itching.     Multiple Vitamin (MULTI-VITAMIN) tablet Take 1 tablet by mouth daily.     olmesartan (BENICAR) 5 MG tablet Take 1 tablet (5 mg total) by mouth daily. 90 tablet 3   predniSONE (DELTASONE) 2.5 MG tablet Take 1 tablet (2.5 mg total) by mouth daily with breakfast. 90 tablet 3   No current facility-administered medications on file prior to visit.    BP 120/70   Pulse 70   Temp 97.7 F (36.5 C) (Oral)   Ht 5' 11.5" (1.816 m)   Wt 177 lb (80.3 kg)   SpO2 96%   BMI 24.34 kg/m       Objective:   Physical Exam Vitals and nursing note reviewed.  Constitutional:      General: He is not in acute distress.    Appearance: Normal appearance. He is not ill-appearing.  HENT:     Head: Normocephalic and atraumatic.     Right Ear: Tympanic membrane,  ear canal and external ear normal. There is no impacted cerumen.     Left Ear: Tympanic membrane, ear canal and external ear normal. There is no impacted cerumen.     Nose: Nose normal. No congestion or rhinorrhea.     Mouth/Throat:     Mouth: Mucous membranes are moist.     Pharynx: Oropharynx is clear.  Eyes:     Extraocular Movements: Extraocular movements intact.     Conjunctiva/sclera: Conjunctivae normal.     Pupils: Pupils are equal, round, and reactive to light.  Neck:     Vascular: No carotid bruit.  Cardiovascular:     Rate and Rhythm: Normal rate and regular rhythm.     Pulses: Normal pulses.     Heart sounds: No murmur heard.    No friction rub. No gallop.  Pulmonary:     Effort: Pulmonary effort is normal.     Breath sounds: Normal breath sounds.  Abdominal:     General: Abdomen is flat. Bowel sounds are normal. There is no distension.     Palpations: Abdomen is soft. There is no mass.     Tenderness: There is no abdominal tenderness. There is no guarding or rebound.     Hernia: No hernia is present.  Musculoskeletal:        General: Normal range of motion.     Cervical back: Normal range of motion and neck supple.  Lymphadenopathy:     Cervical: No cervical adenopathy.  Skin:    General: Skin is warm and dry.     Capillary Refill: Capillary refill takes less than 2 seconds.  Neurological:     General: No focal deficit present.     Mental Status: He is alert and oriented to person, place, and time.  Psychiatric:        Mood and Affect: Mood normal.        Behavior: Behavior normal.        Thought Content: Thought content normal.  Judgment: Judgment normal.       Assessment & Plan:  1. Routine general medical examination at a health care facility Today patient counseled on age appropriate routine health concerns for screening and prevention, each reviewed and up to date or declined. Immunizations reviewed and up to date or declined. Labs ordered and  reviewed. Risk factors for depression reviewed and negative. Hearing function and visual acuity are intact. ADLs screened and addressed as needed. Functional ability and level of safety reviewed and appropriate. Education, counseling and referrals performed based on assessed risks today. Patient provided with a copy of personalized plan for preventive services.   2. Polymyalgia rheumatica (HCC) - Continue prednisone daily  - Lipid panel; Future - TSH; Future - CBC; Future - Comprehensive metabolic panel; Future - Sedimentation Rate; Future  3. Benign prostatic hyperplasia without lower urinary tract symptoms  - PSA; Future  4. PVD (peripheral vascular disease) ([place]) - Per vascular surgery  - Lipid panel; Future - TSH; Future - CBC; Future - Comprehensive metabolic panel; Future  5. Essential hypertension - Well controlled. No change in medication  - Lipid panel; Future - TSH; Future - CBC; Future - Comprehensive metabolic panel; Future  6. HYPERCHOLESTEROLEMIA - Continue statin  - Lipid panel; Future - TSH; Future - CBC; Future - Comprehensive metabolic panel; Future  7. Hyperprolactinemia (HCC)  - Prolactin; Future - Testosterone; Future - Ambulatory referral to Endocrinology  8. Erectile dysfunction due to diseases classified elsewhere  - tadalafil (CIALIS) 20 MG tablet; Take 0.5-1 tablets (10-20 mg total) by mouth every other day as needed for erectile dysfunction.  Dispense: 10 tablet; Refill: [place], NP

## 2022-12-08 NOTE — Patient Instructions (Signed)
It was great seeing you today   We will follow up with you regarding your lab work   Please let me know if you need anything   

## 2022-12-09 LAB — PROLACTIN: Prolactin: <1 ng/mL — ABNORMAL LOW (ref 2.0–18.0)

## 2022-12-16 DIAGNOSIS — E221 Hyperprolactinemia: Secondary | ICD-10-CM | POA: Diagnosis not present

## 2022-12-16 DIAGNOSIS — E291 Testicular hypofunction: Secondary | ICD-10-CM | POA: Diagnosis not present

## 2022-12-16 DIAGNOSIS — I10 Essential (primary) hypertension: Secondary | ICD-10-CM | POA: Diagnosis not present

## 2022-12-21 ENCOUNTER — Other Ambulatory Visit: Payer: Self-pay | Admitting: Adult Health

## 2022-12-21 DIAGNOSIS — I10 Essential (primary) hypertension: Secondary | ICD-10-CM

## 2022-12-21 DIAGNOSIS — M353 Polymyalgia rheumatica: Secondary | ICD-10-CM

## 2023-02-11 ENCOUNTER — Other Ambulatory Visit: Payer: Self-pay | Admitting: Adult Health

## 2023-02-11 DIAGNOSIS — E7800 Pure hypercholesterolemia, unspecified: Secondary | ICD-10-CM

## 2023-02-15 DIAGNOSIS — N138 Other obstructive and reflux uropathy: Secondary | ICD-10-CM | POA: Diagnosis not present

## 2023-02-15 DIAGNOSIS — R339 Retention of urine, unspecified: Secondary | ICD-10-CM | POA: Diagnosis not present

## 2023-02-15 DIAGNOSIS — N401 Enlarged prostate with lower urinary tract symptoms: Secondary | ICD-10-CM | POA: Diagnosis not present

## 2023-02-16 DIAGNOSIS — E221 Hyperprolactinemia: Secondary | ICD-10-CM | POA: Diagnosis not present

## 2023-02-16 DIAGNOSIS — E291 Testicular hypofunction: Secondary | ICD-10-CM | POA: Diagnosis not present

## 2023-02-22 DIAGNOSIS — E291 Testicular hypofunction: Secondary | ICD-10-CM | POA: Diagnosis not present

## 2023-02-22 DIAGNOSIS — E221 Hyperprolactinemia: Secondary | ICD-10-CM | POA: Diagnosis not present

## 2023-02-22 DIAGNOSIS — I10 Essential (primary) hypertension: Secondary | ICD-10-CM | POA: Diagnosis not present

## 2023-02-25 ENCOUNTER — Ambulatory Visit (INDEPENDENT_AMBULATORY_CARE_PROVIDER_SITE_OTHER): Payer: Medicare HMO | Admitting: Adult Health

## 2023-02-25 ENCOUNTER — Encounter: Payer: Self-pay | Admitting: Adult Health

## 2023-02-25 VITALS — BP 120/70 | HR 64 | Temp 98.0°F | Ht 71.5 in | Wt 174.0 lb

## 2023-02-25 DIAGNOSIS — M353 Polymyalgia rheumatica: Secondary | ICD-10-CM | POA: Diagnosis not present

## 2023-02-25 DIAGNOSIS — S80812A Abrasion, left lower leg, initial encounter: Secondary | ICD-10-CM | POA: Diagnosis not present

## 2023-02-25 DIAGNOSIS — Z23 Encounter for immunization: Secondary | ICD-10-CM

## 2023-02-25 MED ORDER — DOXYCYCLINE HYCLATE 100 MG PO CAPS
100.0000 mg | ORAL_CAPSULE | Freq: Two times a day (BID) | ORAL | 0 refills | Status: DC
Start: 2023-02-25 — End: 2023-05-13

## 2023-02-25 NOTE — Progress Notes (Signed)
Subjective:    Patient ID: Brandon Dixon, male    DOB: 01-28-1946, 77 y.o.   MRN: [id number]  HPI 77 year old male who  has a past medical history of ALLERGIC RHINITIS (09/26/2007), BENIGN PROSTATIC HYPERTROPHY (05/27/2007), COLONIC POLYPS, HX OF (05/27/2007), DERMATITIS (08/25/2010), GERD (05/27/2007), HYPERCHOLESTEROLEMIA (09/26/2007), and Peripheral vascular disease (HCC).  He presents to the office today for an acute issue. Yesterday [place] barbed wire fence causing multiple scrapes on his right lower leg.   He would also like to check a sed rate for history of polymyalgia rheumatica    Review of Systems See HPI   Past Medical History:  Diagnosis Date   ALLERGIC RHINITIS 09/26/2007   BENIGN PROSTATIC HYPERTROPHY 05/27/2007   COLONIC POLYPS, HX OF 05/27/2007   DERMATITIS 08/25/2010   GERD 05/27/2007   HYPERCHOLESTEROLEMIA 09/26/2007   Peripheral vascular disease (HCC)     Social History   Socioeconomic History   Marital status: Married    Spouse name: Not on file   Number of children: Not on file   Years of education: Not on file   Highest education level: Some college, no degree  Occupational History   Not on file  Tobacco Use   Smoking status: Former    Types: Cigarettes    Quit date: 10/19/2001    Years since quitting: 21.3   Smokeless tobacco: Never  Vaping Use   Vaping Use: Never used  Substance and Sexual Activity   Alcohol use: Yes    Alcohol/week: 6.0 standard drinks of alcohol    Types: 6 Cans of beer per week   Drug use: No   Sexual activity: Not on file  Other Topics Concern   Not on file  Social History Narrative   Works as a Patent attorney    Married    Three daughters    Social Determinants of Health   Financial Resource Strain: Low Risk  (07/16/2022)   Overall Financial Resource Strain (CARDIA)    Difficulty of Paying Living Expenses: Not hard at all  Food Insecurity: No Food Insecurity (07/16/2022)    Hunger Vital Sign    Worried About Running Out of Food in the Last Year: Never true    Ran Out of Food in the Last Year: Never true  Transportation Needs: No Transportation Needs (07/16/2022)   PRAPARE - Administrator, Civil Service (Medical): No    Lack of Transportation (Non-Medical): No  Physical Activity: Sufficiently Active (07/16/2022)   Exercise Vital Sign    Days of Exercise per Week: 5 days    Minutes of Exercise per Session: 30 min  Stress: No Stress Concern Present (07/16/2022)   [employer] of Occupational Health - Occupational Stress Questionnaire    Feeling of Stress : Not at all  Social Connections: Socially Integrated (07/16/2022)   Social Connection and Isolation Panel [NHANES]    Frequency of Communication with Friends and Family: More than three times a week    Frequency of Social Gatherings with Friends and Family: More than three times a week    Attends Religious Services: More than 4 times per year    Active Member of [employer] or Organizations: Yes    Attends Banker Meetings: More than 4 times per year    Marital Status: Married  Catering manager Violence: Not At Risk (07/16/2022)   Humiliation, Afraid, Rape, and Kick questionnaire  Fear of Current or Ex-Partner: No    Emotionally Abused: No    Physically Abused: No    Sexually Abused: No    Past Surgical History:  Procedure Laterality Date   LOWER EXTREMITY ANGIOGRAM Left 04/30/2014   Procedure: LOWER EXTREMITY ANGIOGRAM;  Surgeon: Chuck Hint, MD;  Location: [facility] CATH LAB;  Service: Cardiovascular;  Laterality: Left;   ROTATOR CUFF REPAIR     VASECTOMY      Family History  Problem Relation Age of Onset   Heart disease Mother    Heart attack Mother        2    Cancer Father        lung ca   Hypertension Sister    Diabetes Brother    Hypertension Sister    Diabetes Brother    Diabetes Brother    Diabetes Brother    Diabetes Brother    Diabetes Daughter      No Known Allergies  Current Outpatient Medications on File Prior to Visit  Medication Sig Dispense Refill   aspirin EC 81 MG tablet Take 81 mg by mouth daily.     atorvastatin (LIPITOR) 80 MG tablet TAKE 1 TABLET EVERY EVENING 90 tablet 3   cabergoline (DOSTINEX) 0.5 MG tablet Take 0.25 mg by mouth 2 (two) times a week.     calcium carbonate (OS-CAL) 600 MG TABS tablet Take 600 mg by mouth daily with breakfast.     hydrocortisone ointment 0.5 % Apply 1 application topically daily as needed for itching.     Multiple Vitamin (MULTI-VITAMIN) tablet Take 1 tablet by mouth daily.     olmesartan (BENICAR) 5 MG tablet TAKE 1 TABLET EVERY DAY 90 tablet 3   predniSONE (DELTASONE) 2.5 MG tablet TAKE 1 TABLET EVERY DAY WITH BREAKFAST 90 tablet 3   tadalafil (CIALIS) 20 MG tablet Take 0.5-1 tablets (10-20 mg total) by mouth every other day as needed for erectile dysfunction. 10 tablet 11   No current facility-administered medications on file prior to visit.    BP 120/70   Pulse 64   Temp 98 F (36.7 C) (Oral)   Ht 5' 11.5" (1.816 m)   Wt 174 lb (78.9 kg)   SpO2 95%   BMI 23.93 kg/m       Objective:   Physical Exam Vitals and nursing note reviewed.  Constitutional:      Appearance: Normal appearance.  Musculoskeletal:        General: Normal range of motion.  Skin:    General: Skin is warm and dry.     Capillary Refill: Capillary refill takes less than 2 seconds.          Comments: Multiple scraps noted on medial aspect of right lower leg   Neurological:     General: No focal deficit present.     Mental Status: He is alert and oriented to person, place, and time. Mental status is at baseline.  Psychiatric:        Mood and Affect: Mood normal.        Behavior: Behavior normal.        Thought Content: Thought content normal.       Assessment & Plan:  1. Abrasion of left leg, initial encounter  - doxycycline (VIBRAMYCIN) 100 MG capsule; Take 1 capsule (100 mg total) by  mouth 2 (two) times daily.  Dispense: 14 capsule; Refill: 0  2. Polymyalgia rheumatica (HCC)  - Sedimentation Rate; Future  3. Encounter for immunization -  Will update tdap since this is a high risk wound - Tdap vaccine greater than or equal to 7yo IM   Shirline Frees, NP

## 2023-02-26 LAB — SEDIMENTATION RATE: Sed Rate: 30 mm/hr — ABNORMAL HIGH (ref 0–20)

## 2023-02-26 NOTE — Addendum Note (Signed)
Addended by: Nancy Fetter on: 02/26/2023 06:12 AM   Modules accepted: Level of Service

## 2023-03-09 ENCOUNTER — Telehealth: Payer: Self-pay | Admitting: Adult Health

## 2023-03-09 NOTE — Telephone Encounter (Addendum)
Pt would like to look into some counseling regarding a mental health issue that is affecting his family.  Pt is asking for a referral to a mental health facility/Provider.  Pt is asking for a call back with Provider's contact info, once referral has been placed

## 2023-03-09 NOTE — Telephone Encounter (Signed)
Left message to return phone call.

## 2023-03-10 NOTE — Telephone Encounter (Signed)
PT notified that a referral is not needed. Pt did not have a place in mind. I advised pt to come to the office to pick up a package of different locations. Pt verbalized understanding

## 2023-04-27 ENCOUNTER — Other Ambulatory Visit: Payer: Self-pay | Admitting: *Deleted

## 2023-04-27 DIAGNOSIS — I70211 Atherosclerosis of native arteries of extremities with intermittent claudication, right leg: Secondary | ICD-10-CM

## 2023-05-13 ENCOUNTER — Ambulatory Visit (HOSPITAL_COMMUNITY)
Admission: RE | Admit: 2023-05-13 | Discharge: 2023-05-13 | Disposition: A | Discharge status: Home / Self Care | Payer: Medicare HMO | Source: Ambulatory Visit | Attending: Vascular Surgery | Admitting: Vascular Surgery

## 2023-05-13 ENCOUNTER — Ambulatory Visit: Payer: Medicare HMO | Admitting: Vascular Surgery

## 2023-05-13 ENCOUNTER — Encounter: Payer: Self-pay | Admitting: Vascular Surgery

## 2023-05-13 VITALS — BP 136/68 | HR 61 | Temp 98.1°F | Resp 20 | Ht 71.5 in | Wt 173.0 lb

## 2023-05-13 DIAGNOSIS — I70219 Atherosclerosis of native arteries of extremities with intermittent claudication, unspecified extremity: Secondary | ICD-10-CM

## 2023-05-13 DIAGNOSIS — I70211 Atherosclerosis of native arteries of extremities with intermittent claudication, right leg: Secondary | ICD-10-CM | POA: Insufficient documentation

## 2023-05-13 LAB — VAS US ABI WITH/WO TBI
Left ABI: 0.56
Right ABI: 1.04

## 2023-05-13 NOTE — Progress Notes (Signed)
REASON FOR VISIT:   Follow-up of peripheral arterial disease with claudication left leg.  MEDICAL ISSUES:   PERIPHERAL ARTERIAL DISEASE WITH CLAUDICATION LEFT LEG: This patient has stable claudication of the left leg.  He is highly motivated.  He experiences symptoms in the calf after walking at thousand steps.  He feels that his symptoms have been stable.  He denies any rest pain.  He denies any history of nonhealing wounds.  He quit smoking in 2003.  He is on aspirin and is on a statin.  I have encouraged him to stay as active as possible.  Given that he has been doing well for some time now I offered to see him as needed but he would prefer to have his ABIs checked yearly.  I explained that I will be retiring so he is agreeable to be seen on the PA schedule yearly for his ABIs.  He will call sooner if anything changes.  HPI:   Brandon Dixon is a pleasant 77 y.o. male who I have been following with left lower extremity claudication.  He has severe infrainguinal arterial occlusive disease on the left.  Based on his arteriogram in 2015 his options for revascularization would include a tibial bypass or potentially endovascular approach.  However he was highly motivated and his symptoms have been stable so we have been following him on a yearly basis.  When I saw him last he was on aspirin and was on a statin.  He is not a smoker.  He comes in for a 1 year follow-up visit.  Since I saw him last the patient has stable left calf claudication.  He can walk up thousand steps before experiencing symptoms.  He denies any symptoms on the right side.  He denies any rest pain.  He denies any history of nonhealing ulcers.  He quit smoking in 2003.  He is on aspirin and is on a statin.  Past Medical History:  Diagnosis Date   ALLERGIC RHINITIS 09/26/2007   BENIGN PROSTATIC HYPERTROPHY 05/27/2007   COLONIC POLYPS, HX OF 05/27/2007   DERMATITIS 08/25/2010   GERD 05/27/2007   HYPERCHOLESTEROLEMIA 09/26/2007    Peripheral vascular disease (HCC)     Family History  Problem Relation Age of Onset   Heart disease Mother    Heart attack Mother        60    Cancer Father        lung ca   Hypertension Sister    Diabetes Brother    Hypertension Sister    Diabetes Brother    Diabetes Brother    Diabetes Brother    Diabetes Brother    Diabetes Daughter     SOCIAL HISTORY: Social History   Tobacco Use   Smoking status: Former    Current packs/day: 0.00    Types: Cigarettes    Quit date: 10/19/2001    Years since quitting: 21.5   Smokeless tobacco: Never  Substance Use Topics   Alcohol use: Yes    Alcohol/week: 6.0 standard drinks of alcohol    Types: 6 Cans of beer per week    No Known Allergies  Current Outpatient Medications  Medication Sig Dispense Refill   aspirin EC 81 MG tablet Take 81 mg by mouth daily.     atorvastatin (LIPITOR) 80 MG tablet TAKE 1 TABLET EVERY EVENING 90 tablet 3   calcium carbonate (OS-CAL) 600 MG TABS tablet Take 600 mg by mouth daily with breakfast.  hydrocortisone ointment 0.5 % Apply 1 application topically daily as needed for itching.     Multiple Vitamin (MULTI-VITAMIN) tablet Take 1 tablet by mouth daily.     olmesartan (BENICAR) 5 MG tablet TAKE 1 TABLET EVERY DAY 90 tablet 3   predniSONE (DELTASONE) 2.5 MG tablet TAKE 1 TABLET EVERY DAY WITH BREAKFAST 90 tablet 3   tadalafil (CIALIS) 20 MG tablet Take 0.5-1 tablets (10-20 mg total) by mouth every other day as needed for erectile dysfunction. 10 tablet 11   No current facility-administered medications for this visit.    REVIEW OF SYSTEMS:  [X]  denotes positive finding, [ ]  denotes negative finding Cardiac  Comments:  Chest pain or chest pressure:    Shortness of breath upon exertion:    Short of breath when lying flat:    Irregular heart rhythm:        Vascular    Pain in calf, thigh, or hip brought on by ambulation: x   Pain in feet at night that wakes you up from your sleep:     Blood  clot in your veins:    Leg swelling:         Pulmonary    Oxygen at home:    Productive cough:     Wheezing:         Neurologic    Sudden weakness in arms or legs:     Sudden numbness in arms or legs:     Sudden onset of difficulty speaking or slurred speech:    Temporary loss of vision in one eye:     Problems with dizziness:         Gastrointestinal    Blood in stool:     Vomited blood:         Genitourinary    Burning when urinating:     Blood in urine:        Psychiatric    Major depression:         Hematologic    Bleeding problems:    Problems with blood clotting too easily:        Skin    Rashes or ulcers:        Constitutional    Fever or chills:     PHYSICAL EXAM:   Vitals:   05/13/23 1247  BP: 136/68  Pulse: 61  Resp: 20  Temp: 98.1 F (36.7 C)  SpO2: 95%  Weight: 173 lb (78.5 kg)  Height: 5' 11.5" (1.816 m)    GENERAL: The patient is a well-nourished male, in no acute distress. The vital signs are documented above. CARDIAC: There is a regular rate and rhythm.  VASCULAR: I do not detect carotid bruits. On the right side he has a palpable femoral, popliteal, and dorsalis pedis pulse. On the left side he has a palpable femoral pulse.  I cannot palpate pedal pulses. PULMONARY: There is good air exchange bilaterally without wheezing or rales. ABDOMEN: Soft and non-tender with normal pitched bowel sounds.  I do not palpate an aneurysm. MUSCULOSKELETAL: There are no major deformities or cyanosis. NEUROLOGIC: No focal weakness or paresthesias are detected. SKIN: There are no ulcers or rashes noted. PSYCHIATRIC: The patient has a normal affect.  DATA:    ARTERIAL DOPPLER STUDY: I have independently interpreted his arterial Doppler study today.  On the right side there is a triphasic posterior tibial and dorsalis pedis signal.  ABIs 100%.  Toe pressures 128 mmHg.  On the left side there is a monophasic  posterior tibial and dorsalis pedis signal.   ABI is 56%.  Toe pressure is 87 mmHg.  Waverly Ferrari [facility] [phone]

## 2023-05-19 ENCOUNTER — Other Ambulatory Visit: Payer: Self-pay

## 2023-05-19 DIAGNOSIS — I70219 Atherosclerosis of native arteries of extremities with intermittent claudication, unspecified extremity: Secondary | ICD-10-CM

## 2023-05-25 DIAGNOSIS — H18413 Arcus senilis, bilateral: Secondary | ICD-10-CM | POA: Diagnosis not present

## 2023-05-25 DIAGNOSIS — H25043 Posterior subcapsular polar age-related cataract, bilateral: Secondary | ICD-10-CM | POA: Diagnosis not present

## 2023-05-25 DIAGNOSIS — H2513 Age-related nuclear cataract, bilateral: Secondary | ICD-10-CM | POA: Diagnosis not present

## 2023-05-25 DIAGNOSIS — H11153 Pinguecula, bilateral: Secondary | ICD-10-CM | POA: Diagnosis not present

## 2023-05-25 DIAGNOSIS — H2511 Age-related nuclear cataract, right eye: Secondary | ICD-10-CM | POA: Diagnosis not present

## 2023-08-12 DIAGNOSIS — N401 Enlarged prostate with lower urinary tract symptoms: Secondary | ICD-10-CM | POA: Diagnosis not present

## 2023-08-12 DIAGNOSIS — R339 Retention of urine, unspecified: Secondary | ICD-10-CM | POA: Diagnosis not present

## 2023-08-12 DIAGNOSIS — N138 Other obstructive and reflux uropathy: Secondary | ICD-10-CM | POA: Diagnosis not present

## 2023-08-13 ENCOUNTER — Ambulatory Visit (INDEPENDENT_AMBULATORY_CARE_PROVIDER_SITE_OTHER): Payer: Medicare HMO | Admitting: Adult Health

## 2023-08-13 ENCOUNTER — Encounter: Payer: Self-pay | Admitting: Adult Health

## 2023-08-13 ENCOUNTER — Ambulatory Visit (INDEPENDENT_AMBULATORY_CARE_PROVIDER_SITE_OTHER): Payer: Medicare HMO

## 2023-08-13 VITALS — BP 120/60 | HR 64 | Temp 98.0°F | Ht 71.5 in | Wt 176.0 lb

## 2023-08-13 VITALS — BP 120/60 | HR 64 | Temp 98.1°F | Ht 71.5 in | Wt 176.9 lb

## 2023-08-13 DIAGNOSIS — M353 Polymyalgia rheumatica: Secondary | ICD-10-CM

## 2023-08-13 DIAGNOSIS — Z23 Encounter for immunization: Secondary | ICD-10-CM | POA: Diagnosis not present

## 2023-08-13 DIAGNOSIS — Z0000 Encounter for general adult medical examination without abnormal findings: Secondary | ICD-10-CM

## 2023-08-13 DIAGNOSIS — D1722 Benign lipomatous neoplasm of skin and subcutaneous tissue of left arm: Secondary | ICD-10-CM

## 2023-08-13 DIAGNOSIS — M67442 Ganglion, left hand: Secondary | ICD-10-CM

## 2023-08-13 DIAGNOSIS — T280XXA Burn of mouth and pharynx, initial encounter: Secondary | ICD-10-CM | POA: Diagnosis not present

## 2023-08-13 LAB — SEDIMENTATION RATE: Sed Rate: 22 mm/h — ABNORMAL HIGH (ref 0–20)

## 2023-08-13 NOTE — Progress Notes (Signed)
Subjective:   Brandon Dixon is a 77 y.o. male who presents for Medicare Annual/Subsequent preventive examination.  Visit Complete: In person       Objective:    Today's Vitals   08/13/23 1324  BP: 120/60  Pulse: 64  Temp: 98.1 F (36.7 C)  TempSrc: Oral  SpO2: 98%  Weight: 176 lb 14.4 oz (80.2 kg)  Height: 5' 11.5" (1.816 m)   Body mass index is 24.33 kg/m.     08/13/2023    1:43 PM 07/16/2022   12:50 PM 07/12/2021   11:54 AM 06/26/2020   11:29 AM 01/12/2018    2:53 PM 05/23/2014    2:10 PM 04/30/2014    8:35 AM  Advanced Directives  Does Patient Have a Medical Advance Directive? Yes Yes Yes Yes Yes Patient has advance directive, copy in chart Patient has advance directive, copy not in chart  Type of Advance Directive Healthcare Power of [place];Living will Healthcare Power of [place];Living will Healthcare Power of [place];Living will Healthcare Power of [place];Living will Living will;Healthcare Power of Attorney  Living will;Healthcare Power of Attorney  Does patient want to make changes to medical advance directive?   Yes (Inpatient - patient defers changing a medical advance directive and declines information at this time) No - Patient declined   No change requested  Copy of Healthcare Power of Attorney in Chart? No - copy requested No - copy requested Yes - validated most recent copy scanned in chart (See row information) No - copy requested       Current Medications (verified) Outpatient Encounter Medications as of 08/13/2023  Medication Sig   aspirin EC 81 MG tablet Take 81 mg by mouth daily.   atorvastatin (LIPITOR) 80 MG tablet TAKE 1 TABLET EVERY EVENING   calcium carbonate (OS-CAL) 600 MG TABS tablet Take 600 mg by mouth daily with breakfast.   hydrocortisone ointment 0.5 % Apply 1 application topically daily as needed for itching.   Multiple Vitamin (MULTI-VITAMIN) tablet Take 1 tablet by mouth daily.   olmesartan (BENICAR) 5 MG tablet TAKE 1 TABLET EVERY  DAY   predniSONE (DELTASONE) 2.5 MG tablet TAKE 1 TABLET EVERY DAY WITH BREAKFAST   tadalafil (CIALIS) 20 MG tablet Take 0.5-1 tablets (10-20 mg total) by mouth every other day as needed for erectile dysfunction.   No facility-administered encounter medications on file as of 08/13/2023.    Allergies (verified) Patient has no known allergies.   History: Past Medical History:  Diagnosis Date   ALLERGIC RHINITIS 09/26/2007   BENIGN PROSTATIC HYPERTROPHY 05/27/2007   COLONIC POLYPS, HX OF 05/27/2007   DERMATITIS 08/25/2010   GERD 05/27/2007   HYPERCHOLESTEROLEMIA 09/26/2007   Peripheral vascular disease [facility])    Past Surgical History:  Procedure Laterality Date   LOWER EXTREMITY ANGIOGRAM Left 04/30/2014   Procedure: LOWER EXTREMITY ANGIOGRAM;  Surgeon: Chuck Hint, MD;  Location: [facility] CATH LAB;  Service: Cardiovascular;  Laterality: Left;   ROTATOR CUFF REPAIR     VASECTOMY     Family History  Problem Relation Age of Onset   Heart disease Mother    Heart attack Mother        5    Cancer Father        lung ca   Hypertension Sister    Diabetes Brother    Hypertension Sister    Diabetes Brother    Diabetes Brother    Diabetes Brother    Diabetes Brother    Diabetes Daughter  Social History   Socioeconomic History   Marital status: Married    Spouse name: Not on file   Number of children: Not on file   Years of education: Not on file   Highest education level: Some college, no degree  Occupational History   Not on file  Tobacco Use   Smoking status: Former    Current packs/day: 0.00    Types: Cigarettes    Quit date: 10/19/2001    Years since quitting: 21.8   Smokeless tobacco: Never  Vaping Use   Vaping status: Never Used  Substance and Sexual Activity   Alcohol use: Yes    Alcohol/week: 6.0 standard drinks of alcohol    Types: 6 Cans of beer per week   Drug use: No   Sexual activity: Not on file  Other Topics Concern   Not on file  Social History  Narrative   Works as a Patent attorney    Married    Three daughters    Social Determinants of Health   Financial Resource Strain: Low Risk  (08/13/2023)   Overall Financial Resource Strain (CARDIA)    Difficulty of Paying Living Expenses: Not hard at all  Food Insecurity: No Food Insecurity (08/13/2023)   Hunger Vital Sign    Worried About Running Out of Food in the Last Year: Never true    Ran Out of Food in the Last Year: Never true  Transportation Needs: No Transportation Needs (08/13/2023)   PRAPARE - Administrator, Civil Service (Medical): No    Lack of Transportation (Non-Medical): No  Physical Activity: Sufficiently Active (08/13/2023)   Exercise Vital Sign    Days of Exercise per Week: 7 days    Minutes of Exercise per Session: 60 min  Stress: No Stress Concern Present (08/13/2023)   [employer] of Occupational Health - Occupational Stress Questionnaire    Feeling of Stress : Not at all  Social Connections: Socially Integrated (08/13/2023)   Social Connection and Isolation Panel [NHANES]    Frequency of Communication with Friends and Family: More than three times a week    Frequency of Social Gatherings with Friends and Family: More than three times a week    Attends Religious Services: More than 4 times per year    Active Member of [employer] or Organizations: Yes    Attends Engineer, structural: More than 4 times per year    Marital Status: Married    Tobacco Counseling Counseling given: Not Answered   Clinical Intake:  Pre-visit preparation completed: Yes  Pain : No/denies pain     BMI - recorded: 24.33 Nutritional Status: BMI of 19-24  Normal Nutritional Risks: None Diabetes: No  How often do you need to have someone help you when you read instructions, pamphlets, or other written materials from your doctor or pharmacy?: 1 - Never  Interpreter Needed?: No  Information entered by :: Theresa Mulligan LPN   Activities of Daily  Living    08/13/2023    1:39 PM  In your present state of health, do you have any difficulty performing the following activities:  Hearing? 0  Vision? 0  Difficulty concentrating or making decisions? 0  Walking or climbing stairs? 0  Dressing or bathing? 0  Doing errands, shopping? 0  Preparing Food and eating ? N  Using the Toilet? N  In the past six months, have you accidently leaked urine? Y  Comment Wears pads. Followed by Urologist  Do you have problems  with loss of bowel control? N  Managing your Medications? N  Managing your Finances? N  Housekeeping or managing your Housekeeping? N    Patient Care Team: Shirline Frees, NP as PCP - General (Family Medicine) Charna Elizabeth, MD as Consulting Physician (Gastroenterology) Verner Chol, [facility] (Inactive) as Pharmacist (Pharmacist)  Indicate any recent Medical Services you may have received from other than [facility] providers in the past year (date may be approximate).     Assessment:   This is a routine wellness examination for Thadis.  Hearing/Vision screen Hearing Screening - Comments:: Denies hearing difficulties   Vision Screening - Comments:: Wears rx glasses - up to date with routine eye exams with  Dr Lorin Picket   Goals Addressed               This Visit's Progress     Travel More (pt-stated)         Depression Screen    08/13/2023    1:28 PM 07/16/2022   12:45 PM 12/05/2021   10:04 AM 07/12/2021   11:52 AM 12/04/2020   10:02 AM 06/26/2020   11:31 AM 12/01/2019   10:05 AM  PHQ 2/9 Scores  PHQ - 2 Score 0 0 0 0 0 0 0  PHQ- 9 Score      0     Fall Risk    08/13/2023    1:43 PM 07/16/2022   12:48 PM 04/16/2022   12:19 PM 12/05/2021   10:04 AM 07/12/2021   11:52 AM  Fall Risk   Falls in the past year? 0 0 0 0 0  Number falls in past yr: 0 0  0   Injury with Fall? 0 0  0 0  Risk for fall due to : No Fall Risks No Fall Risks     Follow up Falls prevention discussed Falls prevention discussed       MEDICARE  RISK AT HOME: Medicare Risk at Home Any stairs in or around the home?: Yes If so, are there any without handrails?: No Home free of loose throw rugs in walkways, pet beds, electrical cords, etc?: Yes Adequate lighting in your home to reduce risk of falls?: Yes Life alert?: No Use of a cane, walker or w/c?: No Grab bars in the bathroom?: No Shower chair or bench in shower?: No Elevated toilet seat or a handicapped toilet?: No  TIMED UP AND GO:  Was the test performed?  Yes  Length of time to ambulate 10 feet: 10 sec Gait steady and fast without use of assistive device    Cognitive Function:        08/13/2023    1:44 PM 07/16/2022   12:50 PM 07/12/2021   11:53 AM 06/26/2020   11:36 AM  6CIT Screen  What Year? 0 points 0 points 0 points 0 points  What month? 0 points 0 points 0 points 0 points  What time? 0 points 0 points 0 points 0 points  Count back from 20 0 points 0 points 0 points 0 points  Months in reverse 0 points 0 points 0 points 0 points  Repeat phrase 0 points 0 points 0 points 2 points  Total Score 0 points 0 points 0 points 2 points    Immunizations Immunization History  Administered Date(s) Administered   Fluad Quad(high Dose 65+) 07/29/2023   Influenza Split 08/30/2013   Influenza Whole 07/30/2010   Influenza, High Dose Seasonal PF 07/20/2014, 07/02/2015, 07/15/2016, 07/04/2019, 07/28/2021, 07/22/2022   Influenza-Unspecified 08/05/2017, 07/22/2018,  08/19/2020   Moderna Covid Bivalent Peds Booster(59mo Thru 6yrs) 04/10/2022   PFIZER Comirnaty(Gray Top)Covid-19 Tri-Sucrose Vaccine 07/28/2021   PFIZER(Purple Top)SARS-COV-2 Vaccination 12/01/2019, 12/26/2019, 07/19/2020, 02/09/2021   Pfizer(Comirnaty)Fall Seasonal Vaccine 12 years and older 08/03/2022   Pneumococcal Conjugate-13 11/14/2013   Pneumococcal Polysaccharide-23 10/23/2011, 07/22/2018, 07/04/2019   Tdap 03/09/2018, 02/25/2023   Zoster Recombinant(Shingrix) 11/22/2020, 02/09/2021    TDAP status: Up  to date  Flu Vaccine status: Up to date  Pneumococcal vaccine status: Up to date  Covid-19 vaccine status: Declined, Education has been provided regarding the importance of this vaccine but patient still declined. Advised may receive this vaccine at local pharmacy or Health Dept.or vaccine clinic. Aware to provide a copy of the vaccination record if obtained from local pharmacy or Health Dept. Verbalized acceptance and understanding.  Qualifies for Shingles Vaccine? Yes   Zostavax completed Yes   Shingrix Completed?: Yes  Screening Tests Health Maintenance  Topic Date Due   COVID-19 Vaccine (8 - 2023-24 season) 06/20/2023   Medicare Annual Wellness (AWV)  08/12/2024   DTaP/Tdap/Td (3 - Td or Tdap) 02/24/2033   Pneumonia Vaccine 48+ Years old  Completed   INFLUENZA VACCINE  Completed   Hepatitis C Screening  Completed   Zoster Vaccines- Shingrix  Completed   HPV VACCINES  Aged Out   Colonoscopy  Discontinued    Health Maintenance  Health Maintenance Due  Topic Date Due   COVID-19 Vaccine (8 - 2023-24 season) 06/20/2023        Additional Screening:  Hepatitis C Screening: does qualify; Completed 11/19/15  Vision Screening: Recommended annual ophthalmology exams for early detection of glaucoma and other disorders of the eye. Is the patient up to date with their annual eye exam?  Yes  Who is the provider or what is the name of the office in which the patient attends annual eye exams? Dr Lorin Picket If pt is not established with a provider, would they like to be referred to a provider to establish care? No .   Dental Screening: Recommended annual dental exams for proper oral hygiene    Community Resource Referral / Chronic Care Management:  CRR required this visit?  No   CCM required this visit?  No     Plan:     I have personally reviewed and noted the following in the patient's chart:   Medical and social history Use of alcohol, tobacco or illicit drugs  Current  medications and supplements including opioid prescriptions. Patient is not currently taking opioid prescriptions. Functional ability and status Nutritional status Physical activity Advanced directives List of other physicians Hospitalizations, surgeries, and ER visits in previous 12 months Vitals Screenings to include cognitive, depression, and falls Referrals and appointments  In addition, I have reviewed and discussed with patient certain preventive protocols, quality metrics, and best practice recommendations. A written personalized care plan for preventive services as well as general preventive health recommendations were provided to patient.     Tillie Rung, LPN   [id number]   After Visit Summary: Given  Nurse Notes: None

## 2023-08-13 NOTE — Patient Instructions (Addendum)
Brandon Dixon , Thank you for taking time to come for your Medicare Wellness Visit. I appreciate your ongoing commitment to your health goals. Please review the following plan we discussed and let me know if I can assist you in the future.   Referrals/Orders/Follow-Ups/Clinician Recommendations:   This is a list of the screening recommended for you and due dates:  Health Maintenance  Topic Date Due   COVID-19 Vaccine (8 - 2023-24 season) 06/20/2023   Medicare Annual Wellness Visit  08/12/2024   DTaP/Tdap/Td vaccine (3 - Td or Tdap) 02/24/2033   Pneumonia Vaccine  Completed   Flu Shot  Completed   Hepatitis C Screening  Completed   Zoster (Shingles) Vaccine  Completed   HPV Vaccine  Aged Out   Colon Cancer Screening  Discontinued    Advanced directives: (Copy Requested) Please bring a copy of your health care power of attorney and living will to the office to be added to your chart at your convenience.  Next Medicare Annual Wellness Visit scheduled for next year: Yes

## 2023-08-13 NOTE — Progress Notes (Signed)
Subjective:    Patient ID: Brandon Dixon, male    DOB: 07-12-1946, 77 y.o.   MRN: [id number]  HPI 77 year old male who  has a past medical history of ALLERGIC RHINITIS (09/26/2007), BENIGN PROSTATIC HYPERTROPHY (05/27/2007), COLONIC POLYPS, HX OF (05/27/2007), DERMATITIS (08/25/2010), GERD (05/27/2007), HYPERCHOLESTEROLEMIA (09/26/2007), and Peripheral vascular disease (HCC).  He presents to the office today for multiple issues.  He has noticed a small fatty lump on his left wrist.  Denies any pain or loss of range of motion.  Might be getting a little bit larger as time goes on.  Also noticed a hard lump on the top of his left wrist.  He reports that he noticed after he banged his left hand on something hard a couple weeks ago.  He does not have any loss of range of motion of the left hand.  This mass is not always painful.  Furthermore he has a wound on the top of his mouth that has been present for 2 weeks.  It does feel better than when he first noticed.  Unsure if he burned his mouth on something hot out of the microwave.  At home he has been rinsing with hydrogen peroxide and mouthwash.  He would also like to check a sed rate for history of polymyalgia rheumatica.  Is taking prednisone 2.5 mg daily.  Feels a little bit more stiff than he has in the past.   Review of Systems See HPI   Past Medical History:  Diagnosis Date   ALLERGIC RHINITIS 09/26/2007   BENIGN PROSTATIC HYPERTROPHY 05/27/2007   COLONIC POLYPS, HX OF 05/27/2007   DERMATITIS 08/25/2010   GERD 05/27/2007   HYPERCHOLESTEROLEMIA 09/26/2007   Peripheral vascular disease (HCC)     Social History   Socioeconomic History   Marital status: Married    Spouse name: Not on file   Number of children: Not on file   Years of education: Not on file   Highest education level: Some college, no degree  Occupational History   Not on file  Tobacco Use   Smoking status: Former    Current packs/day: 0.00    Types: Cigarettes    Quit  date: 10/19/2001    Years since quitting: 21.8   Smokeless tobacco: Never  Vaping Use   Vaping status: Never Used  Substance and Sexual Activity   Alcohol use: Yes    Alcohol/week: 6.0 standard drinks of alcohol    Types: 6 Cans of beer per week   Drug use: No   Sexual activity: Not on file  Other Topics Concern   Not on file  Social History Narrative   Works as a Patent attorney    Married    Three daughters    Social Determinants of Health   Financial Resource Strain: Low Risk  (08/13/2023)   Overall Financial Resource Strain (CARDIA)    Difficulty of Paying Living Expenses: Not hard at all  Food Insecurity: No Food Insecurity (08/13/2023)   Hunger Vital Sign    Worried About Running Out of Food in the Last Year: Never true    Ran Out of Food in the Last Year: Never true  Transportation Needs: No Transportation Needs (08/13/2023)   PRAPARE - Administrator, Civil Service (Medical): No    Lack of Transportation (Non-Medical): No  Physical Activity: Sufficiently Active (08/13/2023)   Exercise Vital Sign    Days of Exercise per Week: 7 days    Minutes of  Exercise per Session: 60 min  Stress: No Stress Concern Present (08/13/2023)   [employer] of Occupational Health - Occupational Stress Questionnaire    Feeling of Stress : Not at all  Social Connections: Socially Integrated (08/13/2023)   Social Connection and Isolation Panel [NHANES]    Frequency of Communication with Friends and Family: More than three times a week    Frequency of Social Gatherings with Friends and Family: More than three times a week    Attends Religious Services: More than 4 times per year    Active Member of Clubs or Organizations: Yes    Attends Banker Meetings: More than 4 times per year    Marital Status: Married  Catering manager Violence: Not At Risk (08/13/2023)   Humiliation, Afraid, Rape, and Kick questionnaire    Fear of Current or Ex-Partner: No    Emotionally  Abused: No    Physically Abused: No    Sexually Abused: No    Past Surgical History:  Procedure Laterality Date   LOWER EXTREMITY ANGIOGRAM Left 04/30/2014   Procedure: LOWER EXTREMITY ANGIOGRAM;  Surgeon: Chuck Hint, MD;  Location: [facility] CATH LAB;  Service: Cardiovascular;  Laterality: Left;   ROTATOR CUFF REPAIR     VASECTOMY      Family History  Problem Relation Age of Onset   Heart disease Mother    Heart attack Mother        66    Cancer Father        lung ca   Hypertension Sister    Diabetes Brother    Hypertension Sister    Diabetes Brother    Diabetes Brother    Diabetes Brother    Diabetes Brother    Diabetes Daughter     No Known Allergies  Current Outpatient Medications on File Prior to Visit  Medication Sig Dispense Refill   aspirin EC 81 MG tablet Take 81 mg by mouth daily.     atorvastatin (LIPITOR) 80 MG tablet TAKE 1 TABLET EVERY EVENING 90 tablet 3   calcium carbonate (OS-CAL) 600 MG TABS tablet Take 600 mg by mouth daily with breakfast.     hydrocortisone ointment 0.5 % Apply 1 application topically daily as needed for itching.     Multiple Vitamin (MULTI-VITAMIN) tablet Take 1 tablet by mouth daily.     olmesartan (BENICAR) 5 MG tablet TAKE 1 TABLET EVERY DAY 90 tablet 3   predniSONE (DELTASONE) 2.5 MG tablet TAKE 1 TABLET EVERY DAY WITH BREAKFAST 90 tablet 3   tadalafil (CIALIS) 20 MG tablet Take 0.5-1 tablets (10-20 mg total) by mouth every other day as needed for erectile dysfunction. 10 tablet 11   No current facility-administered medications on file prior to visit.    BP 120/60   Pulse 64   Temp 98 F (36.7 C) (Oral)   Ht 5' 11.5" (1.816 m)   Wt 176 lb (79.8 kg)   SpO2 98%   BMI 24.20 kg/m       Objective:   Physical Exam Vitals and nursing note reviewed.  Constitutional:      Appearance: Normal appearance.  HENT:     Mouth/Throat:   Cardiovascular:     Rate and Rhythm: Normal rate and regular rhythm.     Pulses:  Normal pulses.     Heart sounds: Normal heart sounds.  Pulmonary:     Effort: Pulmonary effort is normal.     Breath sounds: Normal breath sounds.  Musculoskeletal:  General: Normal range of motion.     Comments: He has a subcentimeter lipoma on the medial aspect of his left wrist and a subcentimeter ganglion cyst on the dorsal aspect of his left hand near the capitate bone   Skin:    General: Skin is warm and dry.  Neurological:     General: No focal deficit present.     Mental Status: He is alert and oriented to person, place, and time.  Psychiatric:        Mood and Affect: Mood normal.        Behavior: Behavior normal.        Thought Content: Thought content normal.        Judgment: Judgment normal.       Assessment & Plan:  1. Polymyalgia rheumatica (HCC)  - Sedimentation Rate; Future - Consider increasing prednisone to 5 mg daily   2. Lipoma of left upper extremity - advised watchful waiting   3. Ganglion cyst of finger of left hand - Advised watchful waiting  4. Burn of hard palate -Stop using hydrogen peroxide as this is impeding healing.  Can use warm salt water gargles or place Orajel over the burn to decrease pain.   Shirline Frees, NP  Time spent with patient today was 30 minutes which consisted of chart review, discussing  polymyalgia rheumatica, lipoma, ganglion cyst and burn of mouth, work up, treatment answering questions and documentation.

## 2023-08-25 DIAGNOSIS — E221 Hyperprolactinemia: Secondary | ICD-10-CM | POA: Diagnosis not present

## 2023-08-25 DIAGNOSIS — E291 Testicular hypofunction: Secondary | ICD-10-CM | POA: Diagnosis not present

## 2023-10-07 ENCOUNTER — Ambulatory Visit: Payer: Self-pay | Admitting: Adult Health

## 2023-10-07 NOTE — Telephone Encounter (Signed)
  Chief Complaint: R leg swelling between posterior knee and ankle Symptoms: swelling Frequency: 3-4 weeks Pertinent Negatives: Patient denies fever, denies SOB, denies CP, denies injury Disposition: [] ED /[] Urgent Care (no appt availability in office) / [x] Appointment(In office/virtual)/ []  [facility]/ [] Home Care/ [] Refused Recommended Disposition /[] [facility]/ []  Follow-up with PCP Additional Notes: Pt did travel via plane 3 weeks ago but states that the swelling was there prior to the trip.  Pt plays pickle ball 2 days per week and states he is not sure if he injured it, but doesn't believe he did. States when he takes a day off pickle ball the swelling goes down. + circulation hx with BLE per the pt. Pt advised to go to the ED should he develop SOB/CP, agreeable, appt sched for tomorrow.   Copied from CRM [id number]. Topic: Clinical - Red Word Triage >> Oct 07, 2023  9:18 AM Isabell A wrote: Red Word that prompted transfer to Nurse Triage: Patient experiencing swelling in the right lower leg/calf for the past 3-4 weeks. Reason for Disposition  [1] Thigh, calf, or ankle swelling AND [2] only 1 side  Answer Assessment - Initial Assessment Questions 1. ONSET: "When did the swelling start?" (e.g., minutes, hours, days)     3-4 weeks 2. LOCATION: "What part of the leg is swollen?"  "Are both legs swollen or just one leg?"     R lower leg, behind the knee and down the calf 3. SEVERITY: "How bad is the swelling?" (e.g., localized; mild, moderate, severe)   - Localized: Small area of swelling localized to one leg.   - MILD pedal edema: Swelling limited to foot and ankle, pitting edema < 1/4 inch (6 mm) deep, rest and elevation eliminate most or all swelling.   - MODERATE edema: Swelling of lower leg to knee, pitting edema > 1/4 inch (6 mm) deep, rest and elevation only partially reduce swelling.   - SEVERE edema: Swelling extends above knee, facial or hand swelling present.       Moderate swelling 4. REDNESS: "Does the swelling look red or infected?"     denies 5. PAIN: "Is the swelling painful to touch?" If Yes, ask: "How painful is it?"   (Scale 1-10; mild, moderate or severe)     stiffness 6. FEVER: "Do you have a fever?" If Yes, ask: "What is it, how was it measured, and when did it start?"      denies 7. CAUSE: "What do you think is causing the leg swelling?"     Does not think he injured it, does play pickle ball twice a week and thought he pulled it 8. MEDICAL HISTORY: "Do you have a history of blood clots (e.g., DVT), cancer, heart failure, kidney disease, or liver failure?"     Circulation issues BLE 9. RECURRENT SYMPTOM: "Have you had leg swelling before?" If Yes, ask: "When was the last time?" "What happened that time?"     denies 10. OTHER SYMPTOMS: "Do you have any other symptoms?" (e.g., chest pain, difficulty breathing)       Denies,  Protocols used: Leg Swelling and Edema-A-AH

## 2023-10-08 ENCOUNTER — Encounter: Payer: Self-pay | Admitting: Adult Health

## 2023-10-08 ENCOUNTER — Ambulatory Visit (HOSPITAL_BASED_OUTPATIENT_CLINIC_OR_DEPARTMENT_OTHER)
Admission: RE | Admit: 2023-10-08 | Discharge: 2023-10-08 | Disposition: A | Discharge status: Home / Self Care | Payer: Medicare HMO | Source: Ambulatory Visit | Attending: Adult Health | Admitting: Adult Health

## 2023-10-08 ENCOUNTER — Ambulatory Visit (INDEPENDENT_AMBULATORY_CARE_PROVIDER_SITE_OTHER): Payer: Medicare HMO | Admitting: Adult Health

## 2023-10-08 ENCOUNTER — Ambulatory Visit (INDEPENDENT_AMBULATORY_CARE_PROVIDER_SITE_OTHER): Payer: Medicare HMO

## 2023-10-08 VITALS — BP 130/70 | HR 75 | Temp 98.1°F | Ht 71.5 in | Wt 176.0 lb

## 2023-10-08 DIAGNOSIS — M1712 Unilateral primary osteoarthritis, left knee: Secondary | ICD-10-CM | POA: Diagnosis not present

## 2023-10-08 DIAGNOSIS — M353 Polymyalgia rheumatica: Secondary | ICD-10-CM | POA: Diagnosis not present

## 2023-10-08 DIAGNOSIS — M25562 Pain in left knee: Secondary | ICD-10-CM

## 2023-10-08 DIAGNOSIS — M7989 Other specified soft tissue disorders: Secondary | ICD-10-CM | POA: Diagnosis not present

## 2023-10-08 DIAGNOSIS — M25462 Effusion, left knee: Secondary | ICD-10-CM | POA: Diagnosis not present

## 2023-10-08 DIAGNOSIS — M25561 Pain in right knee: Secondary | ICD-10-CM | POA: Diagnosis not present

## 2023-10-08 DIAGNOSIS — R600 Localized edema: Secondary | ICD-10-CM | POA: Diagnosis not present

## 2023-10-08 DIAGNOSIS — M79661 Pain in right lower leg: Secondary | ICD-10-CM | POA: Diagnosis not present

## 2023-10-08 DIAGNOSIS — G8929 Other chronic pain: Secondary | ICD-10-CM | POA: Diagnosis not present

## 2023-10-08 DIAGNOSIS — M25461 Effusion, right knee: Secondary | ICD-10-CM | POA: Diagnosis not present

## 2023-10-08 LAB — SEDIMENTATION RATE: Sed Rate: 29 mm/h — ABNORMAL HIGH (ref 0–20)

## 2023-10-08 NOTE — Addendum Note (Signed)
Addended by: Waymon Amato R on: 10/08/2023 02:11 PM   Modules accepted: Orders

## 2023-10-08 NOTE — Addendum Note (Signed)
Addended by: Waymon Amato R on: 10/08/2023 02:41 PM   Modules accepted: Orders

## 2023-10-08 NOTE — Progress Notes (Signed)
Subjective:    Patient ID: Brandon Dixon, male    DOB: [birth year], 77 y.o.   MRN: [id number]  HPI 77 year old male who  has a past medical history of ALLERGIC RHINITIS (09/26/2007), BENIGN PROSTATIC HYPERTROPHY (05/27/2007), COLONIC POLYPS, HX OF (05/27/2007), DERMATITIS (08/25/2010), GERD (05/27/2007), HYPERCHOLESTEROLEMIA (09/26/2007), and Peripheral vascular disease (HCC).  He is being evaluated today for an acute issue.  He reports that over the last 3 to 4 weeks he has noticed swelling  from his right knee down to his ankle and behind his knee. He has associated pain in his right knee and also reports that he is a mild pain in his right calf and also that his leg feels numb.  Does feel today that the swelling is slightly improved but has not resolved.  He has not had any shortness of breath or chest pain.  Continues to play pickle ball 2 times a week.  He did go on a flight to [place] recently but was having this issue prior to the flight.  He has known severe infrainguinal arterial occlusive disease on the left and is seen by vascular surgery. He does take an 81 mg ASA.   He still has had some swelling and pain to his left knee that is more localized, this has been an ongoing issue for quite some time.  Furthermore he would like to check his sed rate today due to history of polymyalgia rheumatica.  Takes prednisone 2.5 mg daily but has been feeling more stiff as of lately.  Review of Systems See HPI   Past Medical History:  Diagnosis Date   ALLERGIC RHINITIS 09/26/2007   BENIGN PROSTATIC HYPERTROPHY 05/27/2007   COLONIC POLYPS, HX OF 05/27/2007   DERMATITIS 08/25/2010   GERD 05/27/2007   HYPERCHOLESTEROLEMIA 09/26/2007   Peripheral vascular disease (HCC)     Social History   Socioeconomic History   Marital status: Married    Spouse name: Not on file   Number of children: Not on file   Years of education: Not on file   Highest education level: Some college, no degree  Occupational  History   Not on file  Tobacco Use   Smoking status: Former    Current packs/day: 0.00    Types: Cigarettes    Quit date: 10/19/2001    Years since quitting: 21.9   Smokeless tobacco: Never  Vaping Use   Vaping status: Never Used  Substance and Sexual Activity   Alcohol use: Yes    Alcohol/week: 6.0 standard drinks of alcohol    Types: 6 Cans of beer per week   Drug use: No   Sexual activity: Not on file  Other Topics Concern   Not on file  Social History Narrative   Works as a Patent attorney    Married    Three daughters    Social Drivers of Corporate investment banker Strain: Low Risk  (08/13/2023)   Overall Financial Resource Strain (CARDIA)    Difficulty of Paying Living Expenses: Not hard at all  Food Insecurity: No Food Insecurity (08/13/2023)   Hunger Vital Sign    Worried About Running Out of Food in the Last Year: Never true    Ran Out of Food in the Last Year: Never true  Transportation Needs: No Transportation Needs (08/13/2023)   PRAPARE - Administrator, Civil Service (Medical): No    Lack of Transportation (Non-Medical): No  Physical Activity: Sufficiently Active (08/13/2023)  Exercise Vital Sign    Days of Exercise per Week: 7 days    Minutes of Exercise per Session: 60 min  Stress: No Stress Concern Present (08/13/2023)   [employer] of Occupational Health - Occupational Stress Questionnaire    Feeling of Stress : Not at all  Social Connections: Socially Integrated (08/13/2023)   Social Connection and Isolation Panel [NHANES]    Frequency of Communication with Friends and Family: More than three times a week    Frequency of Social Gatherings with Friends and Family: More than three times a week    Attends Religious Services: More than 4 times per year    Active Member of Clubs or Organizations: Yes    Attends Banker Meetings: More than 4 times per year    Marital Status: Married  Catering manager Violence: Not At Risk  (08/13/2023)   Humiliation, Afraid, Rape, and Kick questionnaire    Fear of Current or Ex-Partner: No    Emotionally Abused: No    Physically Abused: No    Sexually Abused: No    Past Surgical History:  Procedure Laterality Date   LOWER EXTREMITY ANGIOGRAM Left 04/30/2014   Procedure: LOWER EXTREMITY ANGIOGRAM;  Surgeon: Chuck Hint, MD;  Location: [facility] C[facility] LAB;  Service: Cardiovascular;  Laterality: Left;   ROTATOR CUFF REPAIR     VASECTOMY      Family History  Problem Relation Age of Onset   Heart disease Mother    Heart attack Mother        27    Cancer Father        lung ca   Hypertension Sister    Diabetes Brother    Hypertension Sister    Diabetes Brother    Diabetes Brother    Diabetes Brother    Diabetes Brother    Diabetes Daughter     No Known Allergies  Current Outpatient Medications on File Prior to Visit  Medication Sig Dispense Refill   aspirin EC 81 MG tablet Take 81 mg by mouth daily.     atorvastatin (LIPITOR) 80 MG tablet TAKE 1 TABLET EVERY EVENING 90 tablet 3   calcium carbonate (OS-CAL) 600 MG TABS tablet Take 600 mg by mouth daily with breakfast.     hydrocortisone ointment 0.5 % Apply 1 application topically daily as needed for itching.     Multiple Vitamin (MULTI-VITAMIN) tablet Take 1 tablet by mouth daily.     olmesartan (BENICAR) 5 MG tablet TAKE 1 TABLET EVERY DAY 90 tablet 3   predniSONE (DELTASONE) 2.5 MG tablet TAKE 1 TABLET EVERY DAY WITH BREAKFAST 90 tablet 3   tadalafil (CIALIS) 20 MG tablet Take 0.5-1 tablets (10-20 mg total) by mouth every other day as needed for erectile dysfunction. 10 tablet 11   No current facility-administered medications on file prior to visit.    BP 130/70   Pulse 75   Temp 98.1 F (36.7 C) (Oral)   Ht 5' 11.5" (1.816 m)   Wt 176 lb (79.8 kg)   SpO2 96%   BMI 24.20 kg/m       Objective:   Physical Exam Vitals and nursing note reviewed.  Constitutional:      Appearance: Normal  appearance.  Cardiovascular:     Rate and Rhythm: Normal rate and regular rhythm.     Pulses: Normal pulses.     Heart sounds: Normal heart sounds.  Pulmonary:     Effort: Pulmonary effort is normal.  Breath sounds: Normal breath sounds.  Musculoskeletal:        General: Swelling present. Normal range of motion.     Right knee: Swelling and bony tenderness present. No deformity or crepitus. Normal range of motion. No tenderness.     Left knee: Swelling (mild swelling noted around left knee) present.     Right lower leg: Tenderness (with palpation to right calf) present. 1+ Pitting Edema present.     Left lower leg: Normal. No edema.     Comments: Possible Bakers cyst present behind right knee  Skin:    General: Skin is warm and dry.  Neurological:     General: No focal deficit present.     Mental Status: He is alert and oriented to person, place, and time.  Psychiatric:        Mood and Affect: Mood normal.        Behavior: Behavior normal.        Thought Content: Thought content normal.        Judgment: Judgment normal.        Assessment & Plan:  1. Lower extremity edema (Primary) -Need to rule out DVT.  Will do D-dimer in the office today and order stat ultrasound. - VAS [place] LOWER EXTREMITY VENOUS (DVT); Future - D-dimer, Quantitative; Future - D-dimer, Quantitative  2. Acute pain of right knee  - DG Knee 1-2 Views Right; Future  3. Chronic pain of left knee - likely arthritis causing pain and swelling  - DG Knee 1-2 Views Left; Future  4. Polymyalgia rheumatica (HCC)  - Sedimentation Rate; Future - Sedimentation Rate  Shirline Frees, NP

## 2023-10-09 LAB — D-DIMER, QUANTITATIVE: D-Dimer, Quant: 4.86 ug{FEU}/mL — ABNORMAL HIGH (ref <0.50)

## 2023-10-12 ENCOUNTER — Other Ambulatory Visit: Payer: Self-pay

## 2023-10-12 MED ORDER — PREDNISONE 5 MG PO TABS: 5.0000 mg | ORAL_TABLET | Freq: Every day | ORAL | 1 refills | Status: DC

## 2023-10-14 ENCOUNTER — Other Ambulatory Visit: Payer: Self-pay | Admitting: Adult Health

## 2023-10-19 ENCOUNTER — Telehealth: Payer: Self-pay | Admitting: Adult Health

## 2023-10-19 DIAGNOSIS — M7121 Synovial cyst of popliteal space [Baker], right knee: Secondary | ICD-10-CM

## 2023-10-19 NOTE — Telephone Encounter (Signed)
Pt would like to know the status of go to ortho for bakers cyst on right knee and fluid on left knee. I did not see a referral place

## 2023-10-19 NOTE — Telephone Encounter (Signed)
Pt has been schedule for Thursday 10/20/2022.

## 2023-10-19 NOTE — Telephone Encounter (Signed)
 Copied from CRM [id number]. Topic: Referral - Status >> Oct 19, 2023  9:35 AM Isabell A wrote: Reason for CRM: Patient calling to check the status for a referral to the orthopedic center.

## 2023-10-19 NOTE — Telephone Encounter (Signed)
Referral has been placed. Speaking to a referral coordinator to see if this can be expedited. Will update.

## 2023-10-21 ENCOUNTER — Encounter: Payer: Self-pay | Admitting: Physician Assistant

## 2023-10-21 ENCOUNTER — Ambulatory Visit: Payer: Medicare HMO | Admitting: Physician Assistant

## 2023-10-21 DIAGNOSIS — M170 Bilateral primary osteoarthritis of knee: Secondary | ICD-10-CM | POA: Diagnosis not present

## 2023-10-21 MED ORDER — LIDOCAINE HCL 1 % IJ SOLN
3.0000 mL | INTRAMUSCULAR | Status: AC | PRN
  Administered 2023-10-21: 3 mL

## 2023-10-21 MED ORDER — BUPIVACAINE HCL 0.25 % IJ SOLN
0.6600 mL | INTRAMUSCULAR | Status: AC | PRN
  Administered 2023-10-21: .66 mL via INTRA_ARTICULAR

## 2023-10-21 MED ORDER — METHYLPREDNISOLONE ACETATE 40 MG/ML IJ SUSP
13.3300 mg | INTRAMUSCULAR | Status: AC | PRN
  Administered 2023-10-21: 13.33 mg via INTRA_ARTICULAR

## 2023-10-21 NOTE — Progress Notes (Signed)
 Office Visit Note   Patient: Brandon Dixon           Date of Birth: May 19, 1946           MRN: [id number] Visit Date: 10/21/2023              Requested by: Merna Huxley, NP [address] [place],  [place] [address] PCP: Merna Huxley, NP   Assessment & Plan: Visit Diagnoses:  1. Bilateral primary osteoarthritis of knee     Plan: Impression is bilateral knee osteoarthritis.  Today, we discussed various treatment options to include cortisone injection for which she would like to proceed.  He will follow-up with us  as needed.  Call with concerns or questions.  Follow-Up Instructions: Return if symptoms worsen or fail to improve.   Orders:  Orders Placed This Encounter  Procedures   Large Joint Inj: bilateral knee   No orders of the defined types were placed in this encounter.     Procedures: Large Joint Inj: bilateral knee on 10/21/2023 3:38 PM Indications: pain Details: 22 G needle, anterolateral approach Medications (Right): 0.66 mL bupivacaine  0.25 %; 3 mL lidocaine  1 %; 13.33 mg methylPREDNISolone  acetate 40 MG/ML Medications (Left): 0.66 mL bupivacaine  0.25 %; 3 mL lidocaine  1 %; 13.33 mg methylPREDNISolone  acetate 40 MG/ML      Clinical Data: No additional findings.   Subjective: Chief Complaint  Patient presents with   Right Knee - Pain   Left Knee - Pain    HPI patient is a very 78 year old gentleman who comes in today with bilateral knee pain right greater than left.  Symptoms began a few months ago.  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  He also notes that he took a trip to [place] for a few weeks back in beginning of November where he did a lot of walking.  His pain today is primarily to the anterior and posterior aspects of the knees.  He has associated swelling at times.  Symptoms are worse with knee flexion, stair climbing, getting in and out of his car  as well as going from a seated to standing position.  He has been taking ibuprofen without significant relief.  Her previous cortisone injection in the knee.  He did undergo ultrasound of the right leg which did show a Baker's cyst.  Negative for DVT.  Review of Systems as detailed in HPI.  All others reviewed and are negative.   Objective: Vital Signs: There were no vitals taken for this visit.  Physical Exam well-developed well-nourished gentleman in no acute distress.  Alert and oriented x 3.  Ortho Exam bilateral knee exam: Small effusion.  Range of motion 0 to 120 degrees.  Medial and lateral joint line tenderness.  Ligaments are stable.  He is neurovascular intact distally.  Specialty Comments:  No specialty comments available.  Imaging: No results found.   PMFS History: Patient Active Problem List   Diagnosis Date Noted   Atherosclerosis of native arteries of extremity with intermittent claudication (HCC) 04/18/2014   Left leg claudication (HCC) 04/05/2014   Polymyalgia rheumatica (HCC) 02/26/2014   DERMATITIS 08/25/2010   HYPERCHOLESTEROLEMIA 09/26/2007   Allergic rhinitis 09/26/2007   GERD 05/27/2007   BPH (benign prostatic hyperplasia) 05/27/2007   History of colonic polyps 05/27/2007   Past Medical History:  Diagnosis Date   ALLERGIC RHINITIS 09/26/2007   BENIGN PROSTATIC HYPERTROPHY 05/27/2007  COLONIC POLYPS, HX OF 05/27/2007   DERMATITIS 08/25/2010   GERD 05/27/2007   HYPERCHOLESTEROLEMIA 09/26/2007   Peripheral vascular disease (HCC)     Family History  Problem Relation Age of Onset   Heart disease Mother    Heart attack Mother        4    Cancer Father        lung ca   Hypertension Sister    Diabetes Brother    Hypertension Sister    Diabetes Brother    Diabetes Brother    Diabetes Brother    Diabetes Brother    Diabetes Daughter     Past Surgical History:  Procedure Laterality Date   LOWER EXTREMITY ANGIOGRAM Left 04/30/2014   Procedure: LOWER  EXTREMITY ANGIOGRAM;  Surgeon: Lonni GORMAN Blade, MD;  Location: [facility] CATH LAB;  Service: Cardiovascular;  Laterality: Left;   ROTATOR CUFF REPAIR     VASECTOMY     Social History   Occupational History   Not on file  Tobacco Use   Smoking status: Former    Current packs/day: 0.00    Types: Cigarettes    Quit date: 10/19/2001    Years since quitting: 22.0   Smokeless tobacco: Never  Vaping Use   Vaping status: Never Used  Substance and Sexual Activity   Alcohol use: Yes    Alcohol/week: 6.0 standard drinks of alcohol    Types: 6 Cans of beer per week   Drug use: No   Sexual activity: Not on file

## 2023-11-03 ENCOUNTER — Other Ambulatory Visit: Payer: Self-pay | Admitting: Adult Health

## 2023-11-03 DIAGNOSIS — I10 Essential (primary) hypertension: Secondary | ICD-10-CM

## 2023-11-11 ENCOUNTER — Other Ambulatory Visit: Payer: Self-pay | Admitting: Adult Health

## 2023-11-11 NOTE — Telephone Encounter (Signed)
Okay for refill?  

## 2023-11-11 NOTE — Telephone Encounter (Signed)
Copied from CRM [id number]. Topic: Clinical - Medication Refill >> Nov 11, 2023  3:21 PM Adele Barthel wrote: Most Recent Primary Care Visit:  Provider: Shirline Frees  Department: [facility]  Visit Type: ACUTE  Date: 10/08/2023  Medication: predniSONE (DELTASONE) 5 MG tablet   Has the patient contacted their pharmacy? Yes (Agent: If no, request that the patient contact the pharmacy for the refill. If patient does not wish to contact the pharmacy document the reason why and proceed with request.) (Agent: If yes, when and what did the pharmacy advise?)  Is this the correct pharmacy for this prescription? Yes If no, delete pharmacy and type the correct one.  This is the patient's preferred pharmacy:   [facility] - [place], [place] - [address] [place] [address] Phone: [phone] Fax: [phone]   Has the prescription been filled recently? Yes  Is the patient out of the medication? No  Has the patient been seen for an appointment in the last year OR does the patient have an upcoming appointment? Yes  Can we respond through MyChart? Yes  Agent: Please be advised that Rx refills may take up to 3 business days. We ask that you follow-up with your pharmacy.

## 2023-11-12 MED ORDER — PREDNISONE 5 MG PO TABS: 5.0000 mg | ORAL_TABLET | Freq: Every day | ORAL | 3 refills | Status: DC

## 2023-12-15 ENCOUNTER — Encounter: Payer: Self-pay | Admitting: Adult Health

## 2023-12-15 ENCOUNTER — Ambulatory Visit (INDEPENDENT_AMBULATORY_CARE_PROVIDER_SITE_OTHER): Payer: Medicare HMO | Admitting: Adult Health

## 2023-12-15 VITALS — BP 122/82 | HR 63 | Temp 98.1°F | Ht 71.5 in | Wt 178.0 lb

## 2023-12-15 DIAGNOSIS — I739 Peripheral vascular disease, unspecified: Secondary | ICD-10-CM | POA: Diagnosis not present

## 2023-12-15 DIAGNOSIS — E785 Hyperlipidemia, unspecified: Secondary | ICD-10-CM

## 2023-12-15 DIAGNOSIS — M353 Polymyalgia rheumatica: Secondary | ICD-10-CM

## 2023-12-15 DIAGNOSIS — I10 Essential (primary) hypertension: Secondary | ICD-10-CM | POA: Diagnosis not present

## 2023-12-15 DIAGNOSIS — R7303 Prediabetes: Secondary | ICD-10-CM | POA: Diagnosis not present

## 2023-12-15 DIAGNOSIS — Z0000 Encounter for general adult medical examination without abnormal findings: Secondary | ICD-10-CM

## 2023-12-15 DIAGNOSIS — Z125 Encounter for screening for malignant neoplasm of prostate: Secondary | ICD-10-CM | POA: Diagnosis not present

## 2023-12-15 DIAGNOSIS — N529 Male erectile dysfunction, unspecified: Secondary | ICD-10-CM | POA: Diagnosis not present

## 2023-12-15 LAB — LIPID PANEL
Cholesterol: 176 mg/dL (ref 0–200)
HDL: 77.5 mg/dL (ref >39.00)
LDL Cholesterol: 82 mg/dL (ref 0–99)
NonHDL: 98.48
Total CHOL/HDL Ratio: 2
Triglycerides: 82 mg/dL (ref 0.0–149.0)
VLDL: 16.4 mg/dL (ref 0.0–40.0)

## 2023-12-15 LAB — COMPREHENSIVE METABOLIC PANEL
ALT: 23 U/L (ref 0–53)
AST: 27 U/L (ref 0–37)
Albumin: 4 g/dL (ref 3.5–5.2)
Alkaline Phosphatase: 101 U/L (ref 39–117)
BUN: 15 mg/dL (ref 6–23)
CO2: 30 meq/L (ref 19–32)
Calcium: 9.4 mg/dL (ref 8.4–10.5)
Chloride: 101 meq/L (ref 96–112)
Creatinine, Ser: 0.82 mg/dL (ref 0.40–1.50)
GFR: 84.64 mL/min (ref >60.00)
Glucose, Bld: 99 mg/dL (ref 70–99)
Potassium: 4.2 meq/L (ref 3.5–5.1)
Sodium: 139 meq/L (ref 135–145)
Total Bilirubin: 0.5 mg/dL (ref 0.2–1.2)
Total Protein: 7.2 g/dL (ref 6.0–8.3)

## 2023-12-15 LAB — CBC
HCT: 45.8 % (ref 39.0–52.0)
Hemoglobin: 15.1 g/dL (ref 13.0–17.0)
MCHC: 33 g/dL (ref 30.0–36.0)
MCV: 92.5 fL (ref 78.0–100.0)
Platelets: 227 10*3/uL (ref 150.0–400.0)
RBC: 4.95 Mil/uL (ref 4.22–5.81)
RDW: 14.1 % (ref 11.5–15.5)
WBC: 9.1 10*3/uL (ref 4.0–10.5)

## 2023-12-15 LAB — HEMOGLOBIN A1C: Hgb A1c MFr Bld: 6.4 % (ref 4.6–6.5)

## 2023-12-15 LAB — TSH: TSH: 0.96 u[IU]/mL (ref 0.35–5.50)

## 2023-12-15 LAB — SEDIMENTATION RATE: Sed Rate: 34 mm/h — ABNORMAL HIGH (ref 0–20)

## 2023-12-15 LAB — PSA: PSA: 0.21 ng/mL (ref 0.10–4.00)

## 2023-12-15 MED ORDER — TADALAFIL 20 MG PO TABS: 10.0000 mg | ORAL_TABLET | ORAL | 11 refills | Status: AC | PRN

## 2023-12-15 NOTE — Progress Notes (Signed)
 Subjective:    Patient ID: AMAR SIPPEL, male    DOB: [birth year], 78 y.o.   MRN: [id number]  HPI Patient presents for yearly preventative medicine examination. He is a pleasant 78 year old male who  has a past medical history of ALLERGIC RHINITIS (09/26/2007), BENIGN PROSTATIC HYPERTROPHY (05/27/2007), COLONIC POLYPS, HX OF (05/27/2007), DERMATITIS (08/25/2010), GERD (05/27/2007), HYPERCHOLESTEROLEMIA (09/26/2007), and Peripheral vascular disease (HCC).  Hyperlipidemia- Lipitor 80 mg.  He denies myalgia or fatigue. Lab Results  Component Value Date   CHOL 173 12/08/2022   HDL 67.50 12/08/2022   LDLCALC 89 12/08/2022   LDLDIRECT 193.1 09/22/2006   TRIG 83.0 12/08/2022   CHOLHDL 3 12/08/2022   PVD-  is followed by vascular surgery on yearly basis due to peripheral vascular disease in the left leg.  He denies any complaints today  Polymyalgia rheumatica-is currently prescribed prednisone 5 mg daily.  He did have a bone density screen done in June 2022 which was normal. Erythrocyte Sedimentation Rate     Component Value Date/Time   ESRSEDRATE 29 (H) 10/08/2023 1113   Hypertension-  is managed with Benicar 5 mg daily.  He denies dizziness, lightheadedness, chest pain, shortness of breath BP Readings from Last 3 Encounters:  12/15/23 122/82  10/08/23 130/70  08/13/23 120/60   ED- uses Cialis 20 mg PRN  Prediabetes - likely from chronic steroid use for PMR Lab Results  Component Value Date   HGBA1C 6.1 12/05/2021   All immunizations and health maintenance protocols were reviewed with the patient and needed orders were placed.  Appropriate screening laboratory values were ordered for the patient including screening of hyperlipidemia, renal function and hepatic function. If indicated by BPH, a PSA was ordered.  Medication reconciliation,  past medical history, social history, problem list and allergies were reviewed in detail with the patient  Goals were established with regard to  weight loss, exercise, and  diet in compliance with medications. He is very active and eats healthy Wt Readings from Last 3 Encounters:  12/15/23 178 lb (80.7 kg)  10/08/23 176 lb (79.8 kg)  08/13/23 176 lb (79.8 kg)   He has no acute issues. Reports that he is feeling " pretty good".   Review of Systems  Constitutional: Negative.   HENT: Negative.    Eyes: Negative.   Respiratory: Negative.    Cardiovascular: Negative.   Gastrointestinal: Negative.   Endocrine: Negative.   Genitourinary: Negative.   Musculoskeletal:  Positive for arthralgias.  Skin: Negative.   Allergic/Immunologic: Negative.   Neurological: Negative.   Hematological: Negative.   Psychiatric/Behavioral: Negative.    All other systems reviewed and are negative.  Past Medical History:  Diagnosis Date   ALLERGIC RHINITIS 09/26/2007   BENIGN PROSTATIC HYPERTROPHY 05/27/2007   COLONIC POLYPS, HX OF 05/27/2007   DERMATITIS 08/25/2010   GERD 05/27/2007   HYPERCHOLESTEROLEMIA 09/26/2007   Peripheral vascular disease (HCC)     Social History   Socioeconomic History   Marital status: Married    Spouse name: Not on file   Number of children: Not on file   Years of education: Not on file   Highest education level: Some college, no degree  Occupational History   Not on file  Tobacco Use   Smoking status: Former    Current packs/day: 0.00    Types: Cigarettes    Quit date: 10/19/2001    Years since quitting: 22.1   Smokeless tobacco: Never  Vaping Use   Vaping status: Never Used  Substance  and Sexual Activity   Alcohol use: Yes    Alcohol/week: 6.0 standard drinks of alcohol    Types: 6 Cans of beer per week   Drug use: No   Sexual activity: Not on file  Other Topics Concern   Not on file  Social History Narrative   Works as a Patent attorney    Married    Three daughters    Social Drivers of Corporate investment banker Strain: Low Risk  (08/13/2023)   Overall Financial Resource Strain (CARDIA)     Difficulty of Paying Living Expenses: Not hard at all  Food Insecurity: No Food Insecurity (08/13/2023)   Hunger Vital Sign    Worried About Running Out of Food in the Last Year: Never true    Ran Out of Food in the Last Year: Never true  Transportation Needs: No Transportation Needs (08/13/2023)   PRAPARE - Administrator, Civil Service (Medical): No    Lack of Transportation (Non-Medical): No  Physical Activity: Sufficiently Active (08/13/2023)   Exercise Vital Sign    Days of Exercise per Week: 7 days    Minutes of Exercise per Session: 60 min  Stress: No Stress Concern Present (08/13/2023)   [employer] of Occupational Health - Occupational Stress Questionnaire    Feeling of Stress : Not at all  Social Connections: Socially Integrated (08/13/2023)   Social Connection and Isolation Panel [NHANES]    Frequency of Communication with Friends and Family: More than three times a week    Frequency of Social Gatherings with Friends and Family: More than three times a week    Attends Religious Services: More than 4 times per year    Active Member of Clubs or Organizations: Yes    Attends Banker Meetings: More than 4 times per year    Marital Status: Married  Catering manager Violence: Not At Risk (08/13/2023)   Humiliation, Afraid, Rape, and Kick questionnaire    Fear of Current or Ex-Partner: No    Emotionally Abused: No    Physically Abused: No    Sexually Abused: No    Past Surgical History:  Procedure Laterality Date   LOWER EXTREMITY ANGIOGRAM Left 04/30/2014   Procedure: LOWER EXTREMITY ANGIOGRAM;  Surgeon: Chuck Hint, MD;  Location: [facility] CATH LAB;  Service: Cardiovascular;  Laterality: Left;   ROTATOR CUFF REPAIR     VASECTOMY      Family History  Problem Relation Age of Onset   Heart disease Mother    Heart attack Mother        31    Cancer Father        lung ca   Hypertension Sister    Diabetes Brother    Hypertension Sister     Diabetes Brother    Diabetes Brother    Diabetes Brother    Diabetes Brother    Diabetes Daughter     No Known Allergies  Current Outpatient Medications on File Prior to Visit  Medication Sig Dispense Refill   aspirin EC 81 MG tablet Take 81 mg by mouth daily.     atorvastatin (LIPITOR) 80 MG tablet TAKE 1 TABLET EVERY EVENING 90 tablet 3   hydrocortisone ointment 0.5 % Apply 1 application topically daily as needed for itching.     Multiple Vitamin (MULTI-VITAMIN) tablet Take 1 tablet by mouth daily.     olmesartan (BENICAR) 5 MG tablet TAKE 1 TABLET EVERY DAY 90 tablet 3   predniSONE (DELTASONE)  5 MG tablet Take 1 tablet (5 mg total) by mouth daily with breakfast. 90 tablet 3   tadalafil (CIALIS) 20 MG tablet Take 0.5-1 tablets (10-20 mg total) by mouth every other day as needed for erectile dysfunction. 10 tablet 11   No current facility-administered medications on file prior to visit.    BP 122/82   Pulse 63   Temp 98.1 F (36.7 C) (Oral)   Ht 5' 11.5" (1.816 m)   Wt 178 lb (80.7 kg)   SpO2 97%   BMI 24.48 kg/m       Objective:   Physical Exam Vitals and nursing note reviewed.  Constitutional:      General: He is not in acute distress.    Appearance: Normal appearance. He is not ill-appearing.  HENT:     Head: Normocephalic and atraumatic.     Right Ear: Tympanic membrane, ear canal and external ear normal. There is no impacted cerumen.     Left Ear: Tympanic membrane, ear canal and external ear normal. There is no impacted cerumen.     Nose: Nose normal. No congestion or rhinorrhea.     Mouth/Throat:     Mouth: Mucous membranes are moist.     Pharynx: Oropharynx is clear.  Eyes:     Extraocular Movements: Extraocular movements intact.     Conjunctiva/sclera: Conjunctivae normal.     Pupils: Pupils are equal, round, and reactive to light.  Neck:     Vascular: No carotid bruit.  Cardiovascular:     Rate and Rhythm: Normal rate and regular rhythm.      Pulses: Normal pulses.     Heart sounds: No murmur heard.    No friction rub. No gallop.  Pulmonary:     Effort: Pulmonary effort is normal.     Breath sounds: Normal breath sounds.  Abdominal:     General: Abdomen is flat. Bowel sounds are normal. There is no distension.     Palpations: Abdomen is soft. There is no mass.     Tenderness: There is no abdominal tenderness. There is no guarding or rebound.     Hernia: No hernia is present.  Musculoskeletal:        General: Normal range of motion.     Cervical back: Normal range of motion and neck supple.  Lymphadenopathy:     Cervical: No cervical adenopathy.  Skin:    General: Skin is warm and dry.     Capillary Refill: Capillary refill takes less than 2 seconds.  Neurological:     General: No focal deficit present.     Mental Status: He is alert and oriented to person, place, and time.  Psychiatric:        Mood and Affect: Mood normal.        Behavior: Behavior normal.        Thought Content: Thought content normal.        Judgment: Judgment normal.       Assessment & Plan:   1. Routine general medical examination at a health care facility (Primary) Today patient counseled on age appropriate routine health concerns for screening and prevention, each reviewed and up to date or declined. Immunizations reviewed and up to date or declined. Labs ordered and reviewed. Risk factors for depression reviewed and negative. Hearing function and visual acuity are intact. ADLs screened and addressed as needed. Functional ability and level of safety reviewed and appropriate. Education, counseling and referrals performed based on assessed risks today. Patient provided  with a copy of personalized plan for preventive services. - Follow up in one year or sooner if needed - Continue to stay active and exercise  2. Hyperlipidemia, unspecified hyperlipidemia type - Consider increase in statin  - Lipid panel; Future - TSH; Future - CBC; Future -  Comprehensive metabolic panel; Future  3. PVD (peripheral vascular disease) (HCC) - Per vascular  - Lipid panel; Future - TSH; Future - CBC; Future - Comprehensive metabolic panel; Future  4. Polymyalgia rheumatica (HCC) - Continue with prednisone 5 mg daily.  - Lipid panel; Future - TSH; Future - CBC; Future - Comprehensive metabolic panel; Future - Sedimentation Rate  5. Essential hypertension - well controlled. No change in medication - Lipid panel; Future - TSH; Future - CBC; Future - Comprehensive metabolic panel; Future  6. Erectile dysfunction, unspecified erectile dysfunction type  - Lipid panel; Future - TSH; Future - CBC; Future - Comprehensive metabolic panel; Future - tadalafil (CIALIS) 20 MG tablet; Take 0.5-1 tablets (10-20 mg total) by mouth every other day as needed for erectile dysfunction.  Dispense: 10 tablet; Refill: 11  7. Prediabetes - Consider metformin  - Lipid panel; Future - TSH; Future - CBC; Future - Comprehensive metabolic panel; Future - Hemoglobin A1c; Future  8. Prostate cancer screening  - PSA; Future  Shirline Frees, NP

## 2023-12-17 MED ORDER — METFORMIN HCL ER 500 MG PO TB24: 500.0000 mg | ORAL_TABLET | Freq: Every day | ORAL | 1 refills | Status: DC

## 2023-12-17 NOTE — Addendum Note (Signed)
 Addended by: Waymon Amato R on: 12/17/2023 05:06 PM   Modules accepted: Orders

## 2023-12-20 ENCOUNTER — Ambulatory Visit (HOSPITAL_COMMUNITY)
Admission: RE | Admit: 2023-12-20 | Discharge: 2023-12-20 | Disposition: A | Discharge status: Home / Self Care | Source: Ambulatory Visit | Attending: Physician Assistant | Admitting: Physician Assistant

## 2023-12-20 ENCOUNTER — Ambulatory Visit: Admitting: Physician Assistant

## 2023-12-20 ENCOUNTER — Ambulatory Visit (HOSPITAL_BASED_OUTPATIENT_CLINIC_OR_DEPARTMENT_OTHER): Admitting: Student

## 2023-12-20 ENCOUNTER — Telehealth: Payer: Self-pay

## 2023-12-20 ENCOUNTER — Encounter: Payer: Self-pay | Admitting: Physician Assistant

## 2023-12-20 ENCOUNTER — Telehealth (HOSPITAL_COMMUNITY): Payer: Self-pay

## 2023-12-20 DIAGNOSIS — M1711 Unilateral primary osteoarthritis, right knee: Secondary | ICD-10-CM

## 2023-12-20 DIAGNOSIS — M1712 Unilateral primary osteoarthritis, left knee: Secondary | ICD-10-CM

## 2023-12-20 DIAGNOSIS — M170 Bilateral primary osteoarthritis of knee: Secondary | ICD-10-CM

## 2023-12-20 DIAGNOSIS — M79661 Pain in right lower leg: Secondary | ICD-10-CM | POA: Diagnosis not present

## 2023-12-20 NOTE — Progress Notes (Signed)
 VASCULAR LAB    Right lower extremity venous duplex has been performed.  See CV proc for preliminary results.  Attempted to call report, however [place]ali Persons, PA could not be reached. I was told they would have to send a message to her and she could reach out to the patient with instructions.   Yulianna Folse, RVT 12/20/2023, 4:16 PM

## 2023-12-20 NOTE — Progress Notes (Signed)
 Office Visit Note   Patient: Brandon Dixon           Date of Birth: 1946-08-14           MRN: [id number] Visit Date: 12/20/2023              Requested by: Shirline Frees, NP [address] [place],  [place] [address] PCP: Shirline Frees, NP  Chief Complaint  Patient presents with   Right Leg - Pain   Left Knee - Pain      HPI: Brandon Dixon comes in today complaining of right calf pain and left anterior knee pain.  He does have a history of some arthritis and was given injections by Mardella Layman at the end of January.  He said those help with some of the pain is returned.  He is extremely active.  He is also complaining of the onset of right calf pain.  He did have a Baker's cyst there he cannot palpate that anymore wondering if the Baker's cyst ruptured  Assessment & Plan: Visit Diagnoses: Right knee more than likely ruptured Baker's cyst however given his calf pain and his positive Denna Haggard' sign will have an ultrasound just out of abundance of caution.  There is no evidence of infection.  Left knee patellofemoral arthritis.  We talked about various options I think he might be helped with some Voltaren gel.  He also [facility]  Visit Diagnoses: Right knee more than likely ruptured Baker's cyst however given his calf pain and his positive Denna Haggard' sign will have an ultrasound just out of abundance of caution.  There is no evidence of infection.  Left knee patellofemoral arthritis.  We talked about various options I think he might be helped with some Voltaren gel.  He also will [facility] Plan:   Follow-Up Instructions: Return if symptoms worsen or fail to improve.   Ortho Exam  Patient is alert, oriented, no adenopathy, well-dressed, normal affect, normal respiratory effort. Right knee no erythema some swelling in the calf compartments are soft and compressible he has some pain with active and passive  stretch.  Neurovascular intact Left knee he is neurovascular intact compartments are soft and compressible he does have some grinding with range of motion slight pain on the medial side good varus valgus stability  Imaging: No results found. No images are attached to the encounter.  Labs: Lab Results  Component Value Date   HGBA1C 6.4 12/15/2023   HGBA1C 6.1 12/05/2021   ESRSEDRATE 34 (H) 12/15/2023   ESRSEDRATE 29 (H) 10/08/2023   ESRSEDRATE 22 (H) 08/13/2023   CRP 5.0 07/21/2021   CRP <1.0 12/04/2020   CRP 2.8 05/17/2020   REPTSTATUS 09/12/2021 FINAL 09/10/2021   CULT >=100,000 COLONIES/mL ESCHERICHIA COLI (A) 09/10/2021   LABORGA ESCHERICHIA COLI (A) 09/10/2021     Lab Results  Component Value Date   ALBUMIN 4.0 12/15/2023   ALBUMIN 4.2 12/08/2022   ALBUMIN 4.2 12/05/2021    No results found for: "MG" No results found for: "VD25OH"  No results found for: "PREALBUMIN"    Latest Ref Rng & Units 12/15/2023   10:39 AM 12/08/2022    9:59 AM 12/05/2021   10:41 AM  CBC EXTENDED  WBC 4.0 - 10.5 K/uL 9.1  9.0  8.9   RBC 4.22 - 5.81 Mil/uL 4.95  5.21  4.89   Hemoglobin 13.0 - 17.0  g/dL 56.2  13.0  86.5   HCT 39.0 - 52.0 % 45.8  46.5  43.3   Platelets 150.0 - 400.0 K/uL 227.0  235.0  242.0   NEUT# 1.4 - 7.7 K/uL   6.5   Lymph# 0.7 - 4.0 K/uL   1.5      There is no height or weight on file to calculate BMI.  Orders:  No orders of the defined types were placed in this encounter.  No orders of the defined types were placed in this encounter.    Procedures: No procedures performed  Clinical Data: No additional findings.  ROS:  All other systems negative, except as noted in the HPI. Review of Systems  Objective: Vital Signs: There were no vitals taken for this visit.  Specialty Comments:  No specialty comments available.  PMFS History: Patient Active Problem List   Diagnosis Date Noted   Atherosclerosis of native arteries of extremity with intermittent  claudication (HCC) 04/18/2014   Left leg claudication (HCC) 04/05/2014   Polymyalgia rheumatica (HCC) 02/26/2014   DERMATITIS 08/25/2010   HYPERCHOLESTEROLEMIA 09/26/2007   Allergic rhinitis 09/26/2007   GERD 05/27/2007   BPH (benign prostatic hyperplasia) 05/27/2007   History of colonic polyps 05/27/2007   Past Medical History:  Diagnosis Date   ALLERGIC RHINITIS 09/26/2007   BENIGN PROSTATIC HYPERTROPHY 05/27/2007   COLONIC POLYPS, HX OF 05/27/2007   DERMATITIS 08/25/2010   GERD 05/27/2007   HYPERCHOLESTEROLEMIA 09/26/2007   Peripheral vascular disease (HCC)     Family History  Problem Relation Age of Onset   Heart disease Mother    Heart attack Mother        48    Cancer Father        lung ca   Hypertension Sister    Diabetes Brother    Hypertension Sister    Diabetes Brother    Diabetes Brother    Diabetes Brother    Diabetes Brother    Diabetes Daughter     Past Surgical History:  Procedure Laterality Date   LOWER EXTREMITY ANGIOGRAM Left 04/30/2014   Procedure: LOWER EXTREMITY ANGIOGRAM;  Surgeon: Chuck Hint, MD;  Location: [facility] CATH LAB;  Service: Cardiovascular;  Laterality: Left;   ROTATOR CUFF REPAIR     VASECTOMY     Social History   Occupational History   Not on file  Tobacco Use   Smoking status: Former    Current packs/day: 0.00    Types: Cigarettes    Quit date: 10/19/2001    Years since quitting: 22.1   Smokeless tobacco: Never  Vaping Use   Vaping status: Never Used  Substance and Sexual Activity   Alcohol use: Yes    Alcohol/week: 6.0 standard drinks of alcohol    Types: 6 Cans of beer per week   Drug use: No   Sexual activity: Not on file

## 2023-12-20 NOTE — Telephone Encounter (Signed)
 called to schedule VAS US DVT, no answer, left message with direct line.

## 2023-12-20 NOTE — Telephone Encounter (Signed)
 [facility] called stating that patient is Negative for DVT, but has a partially ruptured baker's cyst.  Please advise.  Thank you.

## 2023-12-20 NOTE — Addendum Note (Signed)
 Addended by: Albertha Ghee E on: 12/20/2023 02:14 PM   Modules accepted: Orders

## 2023-12-22 ENCOUNTER — Telehealth: Payer: Self-pay | Admitting: Physician Assistant

## 2023-12-22 NOTE — Telephone Encounter (Signed)
 Patient called. Would like the results of his test. His cb# [phone]

## 2024-01-24 ENCOUNTER — Telehealth: Payer: Self-pay

## 2024-01-24 NOTE — Telephone Encounter (Signed)
 Copied from CRM [id number]. Topic: Clinical - Prescription Issue >> Jan 24, 2024  2:52 PM Alcus Dad wrote: Reason for CRM: Patient needs pre-auth for medication ketoconazole 2% cream Needs to be sent to [facility] - [place], [place] - [address] [place] [address] Phone: [phone] Fax: [phone]

## 2024-01-25 MED ORDER — KETOCONAZOLE 2 % EX CREA: 1.0000 | TOPICAL_CREAM | Freq: Every day | CUTANEOUS | 0 refills | Status: DC

## 2024-01-25 NOTE — Addendum Note (Signed)
 Addended by: Waymon Amato R on: 01/25/2024 02:45 PM   Modules accepted: Orders

## 2024-01-25 NOTE — Telephone Encounter (Signed)
 Pt hasn't had this medication in in over 2 years.    Please advise if pt suppose to be using.

## 2024-01-25 NOTE — Telephone Encounter (Signed)
 Medication sent to pharmacy

## 2024-01-26 ENCOUNTER — Other Ambulatory Visit: Payer: Self-pay | Admitting: Adult Health

## 2024-01-26 DIAGNOSIS — E7800 Pure hypercholesterolemia, unspecified: Secondary | ICD-10-CM

## 2024-02-21 ENCOUNTER — Telehealth: Payer: Self-pay | Admitting: Adult Health

## 2024-02-21 DIAGNOSIS — L989 Disorder of the skin and subcutaneous tissue, unspecified: Secondary | ICD-10-CM

## 2024-02-21 NOTE — Telephone Encounter (Signed)
 Copied from CRM [id number]. Topic: Referral - Request for Referral >> Feb 21, 2024 10:57 AM Clyde Darling P wrote: Did the patient discuss referral with their provider in the last year? Yes (If No - schedule appointment) (If Yes - send message)  Appointment offered? No  Type of order/referral and detailed reason for visit: Dermatologist- spot on back ( getting itchy and worst over past week)  Preference of office, provider, location: [facility], [address], [place], [place] [address]  If referral order, have you been seen by this specialty before? Yes (If Yes, this issue or another issue? When? Where?  Can we respond through MyChart? Yes

## 2024-02-22 NOTE — Telephone Encounter (Signed)
 Referral placed.

## 2024-02-22 NOTE — Telephone Encounter (Signed)
**Note De-identified  [place] Obfuscation** Please advise 

## 2024-02-23 NOTE — Telephone Encounter (Signed)
 Patient notified of update  and verbalized understanding.p t has already been scheduled

## 2024-02-24 DIAGNOSIS — L821 Other seborrheic keratosis: Secondary | ICD-10-CM | POA: Diagnosis not present

## 2024-02-24 DIAGNOSIS — D239 Other benign neoplasm of skin, unspecified: Secondary | ICD-10-CM | POA: Diagnosis not present

## 2024-02-24 DIAGNOSIS — L814 Other melanin hyperpigmentation: Secondary | ICD-10-CM | POA: Diagnosis not present

## 2024-02-24 DIAGNOSIS — L239 Allergic contact dermatitis, unspecified cause: Secondary | ICD-10-CM | POA: Diagnosis not present

## 2024-02-24 DIAGNOSIS — L218 Other seborrheic dermatitis: Secondary | ICD-10-CM | POA: Diagnosis not present

## 2024-03-16 ENCOUNTER — Other Ambulatory Visit: Payer: Self-pay | Admitting: Adult Health

## 2024-03-17 ENCOUNTER — Telehealth: Payer: Self-pay

## 2024-03-17 NOTE — Telephone Encounter (Signed)
 Left detailed message informing  of update.

## 2024-03-17 NOTE — Telephone Encounter (Signed)
 Copied from CRM [id number]. Topic: Clinical - Prescription Issue >> Mar 17, 2024 11:12 AM Brandon Dixon wrote: Reason for CRM: PT would like to know if he should still take metformin  as pharmacy advise the prescription has been cancelled- pt would like to be reached for clarity [phone]

## 2024-04-17 ENCOUNTER — Ambulatory Visit (INDEPENDENT_AMBULATORY_CARE_PROVIDER_SITE_OTHER): Admitting: Family Medicine

## 2024-04-17 ENCOUNTER — Telehealth: Payer: Self-pay | Admitting: Adult Health

## 2024-04-17 ENCOUNTER — Ambulatory Visit: Payer: Self-pay | Admitting: *Deleted

## 2024-04-17 ENCOUNTER — Encounter: Payer: Self-pay | Admitting: Family Medicine

## 2024-04-17 ENCOUNTER — Ambulatory Visit: Payer: Self-pay | Admitting: Adult Health

## 2024-04-17 ENCOUNTER — Other Ambulatory Visit

## 2024-04-17 VITALS — BP 110/60 | HR 89 | Temp 98.1°F | Wt 175.0 lb

## 2024-04-17 DIAGNOSIS — R319 Hematuria, unspecified: Secondary | ICD-10-CM | POA: Diagnosis not present

## 2024-04-17 DIAGNOSIS — N390 Urinary tract infection, site not specified: Secondary | ICD-10-CM | POA: Diagnosis not present

## 2024-04-17 DIAGNOSIS — J189 Pneumonia, unspecified organism: Secondary | ICD-10-CM | POA: Diagnosis not present

## 2024-04-17 LAB — POC URINALSYSI DIPSTICK (AUTOMATED)
Bilirubin, UA: NEGATIVE
Glucose, UA: NEGATIVE
Ketones, UA: NEGATIVE
Leukocytes, UA: NEGATIVE
Nitrite, UA: NEGATIVE
Protein, UA: POSITIVE — AB
Spec Grav, UA: 1.01 (ref 1.010–1.025)
Urobilinogen, UA: 0.2 U/dL
pH, UA: 6 (ref 5.0–8.0)

## 2024-04-17 MED ORDER — CEFTRIAXONE SODIUM 1 G IJ SOLR
1.0000 g | Freq: Once | INTRAMUSCULAR | Status: AC
  Administered 2024-04-17: 1 g via INTRAMUSCULAR

## 2024-04-17 MED ORDER — HYDROCODONE BIT-HOMATROP MBR 5-1.5 MG/5ML PO SOLN: 5.0000 mL | ORAL | 0 refills | Status: DC | PRN

## 2024-04-17 MED ORDER — AMOXICILLIN-POT CLAVULANATE 875-125 MG PO TABS: 1.0000 | ORAL_TABLET | Freq: Two times a day (BID) | ORAL | 0 refills | Status: DC

## 2024-04-17 NOTE — Telephone Encounter (Signed)
 Message from [facility] sent at 04/17/2024  8:14 AM EDT  pt is having real bad cough and low grade fever    Call History  Contact Date/Time Type Contact Phone/Fax By  04/17/2024 08:11 AM EDT Phone (Incoming) Brandon, Dixon (Self) [phone] [name], [name]

## 2024-04-17 NOTE — Telephone Encounter (Signed)
 FYI Only or Action Required?: FYI only for provider.  Patient was last seen in primary care on 12/15/2023 by Merna Huxley, NP. Called Nurse Triage reporting No chief complaint on file.. Symptoms began several weeks ago. Interventions attempted: OTC medications: Ibuprofen. Symptoms are: gradually worsening.  Triage Disposition: No disposition on file.  Patient/caregiver understands and will follow disposition?:  Reason for Disposition  [1] Coughed up blood AND [2] > 1 tablespoon (15 ml)   (Exception: Blood-tinged sputum.)  Answer Assessment - Initial Assessment Questions Productive cough x2 weeks, got worse last Monday Hemoptysis last night Fatigue Temperature 98.46F A with ibuprofen    SPUTUM: Describe the color of your sputum (none, dry cough; clear, white, yellow, green)     Light yellow color HEMOPTYSIS: Are you coughing up any blood? If so ask: How much? (flecks, streaks, tablespoons, etc.)     Yes, last night DIFFICULTY BREATHING: Are you having difficulty breathing? If Yes, ask: How bad is it? (e.g., mild, moderate, severe)    - MILD: No SOB at rest, mild SOB with walking, speaks normally in sentences, can lie down, no retractions, pulse < 100.    - MODERATE: SOB at rest, SOB with minimal exertion and prefers to sit, cannot lie down flat, speaks in phrases, mild retractions, audible wheezing, pulse 100-120.    - SEVERE: Very SOB at rest, speaks in single words, struggling to breathe, sitting hunched forward, retractions, pulse > 120      A little SOB OTHER SYMPTOMS: Do you have any other symptoms? (e.g., runny nose, wheezing, chest pain)       Tightness in chest on right side  Protocols used: Cough - Acute Productive-A-AH

## 2024-04-17 NOTE — Progress Notes (Signed)
   Subjective:    Patient ID: Brandon Dixon, male    DOB: 06-23-1946, 78 y.o.   MRN: [id number]  HPI Here for two issues, both of which began about 2 weeks ago. He developed a stuffy head with PND, and he had a fever of 101 degrees. After a few days the fever resolved but the congestion moved into his chest. Now he has a cough that produces yellow sputum, he feels tightness in the lower right chest area, and he is very fatigued. He tested negative for Covid yesterday. At the same time he has had some intermittent foam in his urine. There is no burning or urgency. He drinks plenty of water.    Review of Systems  Constitutional:  Positive for fatigue and fever.  HENT:  Positive for congestion. Negative for ear pain, postnasal drip and sore throat.   Eyes: Negative.   Respiratory:  Positive for cough and chest tightness. Negative for shortness of breath and wheezing.   Cardiovascular: Negative.   Genitourinary:  Negative for dysuria, flank pain, frequency and urgency.       Objective:   Physical Exam Constitutional:      Appearance: Normal appearance.  HENT:     Right Ear: Tympanic membrane, ear canal and external ear normal.     Left Ear: Tympanic membrane, ear canal and external ear normal.     Nose: Nose normal.     Mouth/Throat:     Pharynx: Oropharynx is clear.   Eyes:     Conjunctiva/sclera: Conjunctivae normal.    Cardiovascular:     Rate and Rhythm: Normal rate and regular rhythm.     Pulses: Normal pulses.     Heart sounds: Normal heart sounds.  Pulmonary:     Effort: Pulmonary effort is normal.     Breath sounds: Rhonchi and rales present. No wheezing.     Comments: RLL rales  Abdominal:     Tenderness: There is no right CVA tenderness or left CVA tenderness.  Lymphadenopathy:     Cervical: No cervical adenopathy.   Neurological:     Mental Status: He is alert.           Assessment & Plan:  He has a likely RLL pneumonia. He is given a shot of Rocephin ,  and he will follow this with 10 days of Augmentin. He will return in the morning for a CXR. As for the foamy urine, we will send this for a culture.  Garnette Olmsted, MD

## 2024-04-17 NOTE — Telephone Encounter (Signed)
 Pt was in the nurse triage queue to be called and triaged.   He has already been triaged and scheduled for today at 4:00 by another triage nurse.   No repeat call placed.

## 2024-04-17 NOTE — Telephone Encounter (Signed)
 Already triaged in other note.                  Copied from CRM [id number]. Topic: Clinical - Pink Word Triage >> Apr 17, 2024  8:13 AM Cleave MATSU wrote: Reason for Triage: pt is having real bad cough and low grade fever >> Apr 17, 2024  8:34 AM Robinson H wrote: Patient states he has blood coming up when he coughs >> Apr 17, 2024  8:14 AM Cleave G wrote: pt is having real bad cough and low grade fever

## 2024-04-17 NOTE — Addendum Note (Signed)
 Addended by: LADONNA INOCENTE SAILOR on: 04/17/2024 05:22 PM   Modules accepted: Orders

## 2024-04-18 ENCOUNTER — Ambulatory Visit (INDEPENDENT_AMBULATORY_CARE_PROVIDER_SITE_OTHER)

## 2024-04-18 ENCOUNTER — Other Ambulatory Visit

## 2024-04-18 ENCOUNTER — Ambulatory Visit: Payer: Self-pay | Admitting: Family Medicine

## 2024-04-18 DIAGNOSIS — R059 Cough, unspecified: Secondary | ICD-10-CM | POA: Diagnosis not present

## 2024-04-18 DIAGNOSIS — J189 Pneumonia, unspecified organism: Secondary | ICD-10-CM | POA: Diagnosis not present

## 2024-04-18 DIAGNOSIS — R0989 Other specified symptoms and signs involving the circulatory and respiratory systems: Secondary | ICD-10-CM | POA: Diagnosis not present

## 2024-04-18 DIAGNOSIS — N390 Urinary tract infection, site not specified: Secondary | ICD-10-CM | POA: Diagnosis not present

## 2024-04-18 DIAGNOSIS — R319 Hematuria, unspecified: Secondary | ICD-10-CM | POA: Diagnosis not present

## 2024-04-18 DIAGNOSIS — R918 Other nonspecific abnormal finding of lung field: Secondary | ICD-10-CM | POA: Diagnosis not present

## 2024-04-19 ENCOUNTER — Emergency Department (HOSPITAL_BASED_OUTPATIENT_CLINIC_OR_DEPARTMENT_OTHER)

## 2024-04-19 ENCOUNTER — Ambulatory Visit: Payer: Self-pay

## 2024-04-19 ENCOUNTER — Other Ambulatory Visit: Payer: Self-pay

## 2024-04-19 ENCOUNTER — Encounter: Payer: Self-pay | Admitting: Medical

## 2024-04-19 ENCOUNTER — Inpatient Hospital Stay (HOSPITAL_BASED_OUTPATIENT_CLINIC_OR_DEPARTMENT_OTHER)
Admission: EM | Admit: 2024-04-19 | Discharge: 2024-05-02 | DRG: 871 | Disposition: A | Discharge status: Home / Self Care | Source: Ambulatory Visit | Attending: Student | Admitting: Student

## 2024-04-19 ENCOUNTER — Encounter (HOSPITAL_BASED_OUTPATIENT_CLINIC_OR_DEPARTMENT_OTHER): Payer: Self-pay | Admitting: Emergency Medicine

## 2024-04-19 ENCOUNTER — Ambulatory Visit (INDEPENDENT_AMBULATORY_CARE_PROVIDER_SITE_OTHER): Admitting: Medical

## 2024-04-19 VITALS — BP 120/60 | HR 102 | Temp 97.8°F | Resp 20 | Ht 71.5 in | Wt 171.0 lb

## 2024-04-19 DIAGNOSIS — Z7982 Long term (current) use of aspirin: Secondary | ICD-10-CM

## 2024-04-19 DIAGNOSIS — Z1152 Encounter for screening for COVID-19: Secondary | ICD-10-CM | POA: Diagnosis not present

## 2024-04-19 DIAGNOSIS — Z743 Need for continuous supervision: Secondary | ICD-10-CM | POA: Diagnosis not present

## 2024-04-19 DIAGNOSIS — D72829 Elevated white blood cell count, unspecified: Secondary | ICD-10-CM

## 2024-04-19 DIAGNOSIS — R911 Solitary pulmonary nodule: Secondary | ICD-10-CM | POA: Diagnosis not present

## 2024-04-19 DIAGNOSIS — R0602 Shortness of breath: Secondary | ICD-10-CM | POA: Diagnosis present

## 2024-04-19 DIAGNOSIS — J181 Lobar pneumonia, unspecified organism: Secondary | ICD-10-CM | POA: Diagnosis present

## 2024-04-19 DIAGNOSIS — N179 Acute kidney failure, unspecified: Secondary | ICD-10-CM | POA: Diagnosis present

## 2024-04-19 DIAGNOSIS — R0902 Hypoxemia: Secondary | ICD-10-CM | POA: Diagnosis not present

## 2024-04-19 DIAGNOSIS — A419 Sepsis, unspecified organism: Principal | ICD-10-CM | POA: Diagnosis present

## 2024-04-19 DIAGNOSIS — R7401 Elevation of levels of liver transaminase levels: Secondary | ICD-10-CM | POA: Diagnosis present

## 2024-04-19 DIAGNOSIS — I5031 Acute diastolic (congestive) heart failure: Secondary | ICD-10-CM | POA: Diagnosis not present

## 2024-04-19 DIAGNOSIS — M353 Polymyalgia rheumatica: Secondary | ICD-10-CM | POA: Diagnosis not present

## 2024-04-19 DIAGNOSIS — I4891 Unspecified atrial fibrillation: Secondary | ICD-10-CM | POA: Diagnosis not present

## 2024-04-19 DIAGNOSIS — R748 Abnormal levels of other serum enzymes: Secondary | ICD-10-CM | POA: Diagnosis not present

## 2024-04-19 DIAGNOSIS — I700 Atherosclerosis of aorta: Secondary | ICD-10-CM | POA: Diagnosis not present

## 2024-04-19 DIAGNOSIS — R0600 Dyspnea, unspecified: Secondary | ICD-10-CM | POA: Diagnosis not present

## 2024-04-19 DIAGNOSIS — J189 Pneumonia, unspecified organism: Secondary | ICD-10-CM

## 2024-04-19 DIAGNOSIS — R799 Abnormal finding of blood chemistry, unspecified: Secondary | ICD-10-CM | POA: Diagnosis not present

## 2024-04-19 DIAGNOSIS — K7200 Acute and subacute hepatic failure without coma: Secondary | ICD-10-CM | POA: Diagnosis not present

## 2024-04-19 DIAGNOSIS — K219 Gastro-esophageal reflux disease without esophagitis: Secondary | ICD-10-CM | POA: Diagnosis present

## 2024-04-19 DIAGNOSIS — Z79899 Other long term (current) drug therapy: Secondary | ICD-10-CM

## 2024-04-19 DIAGNOSIS — S36119A Unspecified injury of liver, initial encounter: Secondary | ICD-10-CM | POA: Diagnosis present

## 2024-04-19 DIAGNOSIS — E7800 Pure hypercholesterolemia, unspecified: Secondary | ICD-10-CM | POA: Diagnosis not present

## 2024-04-19 DIAGNOSIS — J168 Pneumonia due to other specified infectious organisms: Secondary | ICD-10-CM | POA: Diagnosis not present

## 2024-04-19 DIAGNOSIS — I480 Paroxysmal atrial fibrillation: Secondary | ICD-10-CM | POA: Diagnosis present

## 2024-04-19 DIAGNOSIS — E8809 Other disorders of plasma-protein metabolism, not elsewhere classified: Secondary | ICD-10-CM | POA: Diagnosis present

## 2024-04-19 DIAGNOSIS — R6520 Severe sepsis without septic shock: Secondary | ICD-10-CM | POA: Diagnosis present

## 2024-04-19 DIAGNOSIS — Z7952 Long term (current) use of systemic steroids: Secondary | ICD-10-CM

## 2024-04-19 DIAGNOSIS — R918 Other nonspecific abnormal finding of lung field: Secondary | ICD-10-CM | POA: Diagnosis not present

## 2024-04-19 DIAGNOSIS — K5730 Diverticulosis of large intestine without perforation or abscess without bleeding: Secondary | ICD-10-CM | POA: Diagnosis not present

## 2024-04-19 DIAGNOSIS — Z7984 Long term (current) use of oral hypoglycemic drugs: Secondary | ICD-10-CM | POA: Diagnosis not present

## 2024-04-19 DIAGNOSIS — Z87891 Personal history of nicotine dependence: Secondary | ICD-10-CM | POA: Diagnosis not present

## 2024-04-19 DIAGNOSIS — R7303 Prediabetes: Secondary | ICD-10-CM | POA: Diagnosis present

## 2024-04-19 DIAGNOSIS — J9601 Acute respiratory failure with hypoxia: Secondary | ICD-10-CM | POA: Diagnosis present

## 2024-04-19 DIAGNOSIS — I110 Hypertensive heart disease with heart failure: Secondary | ICD-10-CM | POA: Diagnosis present

## 2024-04-19 DIAGNOSIS — R739 Hyperglycemia, unspecified: Secondary | ICD-10-CM | POA: Insufficient documentation

## 2024-04-19 DIAGNOSIS — J939 Pneumothorax, unspecified: Secondary | ICD-10-CM | POA: Diagnosis not present

## 2024-04-19 DIAGNOSIS — J90 Pleural effusion, not elsewhere classified: Secondary | ICD-10-CM | POA: Diagnosis not present

## 2024-04-19 DIAGNOSIS — Z833 Family history of diabetes mellitus: Secondary | ICD-10-CM

## 2024-04-19 LAB — BASIC METABOLIC PANEL WITH GFR
Anion gap: 15 (ref 5–15)
BUN: 23 mg/dL (ref 8–23)
CO2: 21 mmol/L — ABNORMAL LOW (ref 22–32)
Calcium: 8.8 mg/dL — ABNORMAL LOW (ref 8.9–10.3)
Chloride: 94 mmol/L — ABNORMAL LOW (ref 98–111)
Creatinine, Ser: 1.41 mg/dL — ABNORMAL HIGH (ref 0.61–1.24)
GFR, Estimated: 51 mL/min — ABNORMAL LOW
Glucose, Bld: 302 mg/dL — ABNORMAL HIGH (ref 70–99)
Potassium: 4.4 mmol/L (ref 3.5–5.1)
Sodium: 129 mmol/L — ABNORMAL LOW (ref 135–145)

## 2024-04-19 LAB — I-STAT VENOUS BLOOD GAS, ED
Acid-base deficit: 1 mmol/L (ref 0.0–2.0)
Bicarbonate: 23.7 mmol/L (ref 20.0–28.0)
Calcium, Ion: 1.08 mmol/L — ABNORMAL LOW (ref 1.15–1.40)
HCT: 46 % (ref 39.0–52.0)
Hemoglobin: 15.6 g/dL (ref 13.0–17.0)
O2 Saturation: 32 %
Potassium: 4.5 mmol/L (ref 3.5–5.1)
Sodium: 132 mmol/L — ABNORMAL LOW (ref 135–145)
TCO2: 25 mmol/L (ref 22–32)
pCO2, Ven: 39.6 mmHg — ABNORMAL LOW (ref 44–60)
pH, Ven: 7.384 (ref 7.25–7.43)
pO2, Ven: 20 mmHg — CL (ref 32–45)

## 2024-04-19 LAB — CBC
HCT: 43 % (ref 39.0–52.0)
Hemoglobin: 14.6 g/dL (ref 13.0–17.0)
MCH: 29.4 pg (ref 26.0–34.0)
MCHC: 34 g/dL (ref 30.0–36.0)
MCV: 86.5 fL (ref 80.0–100.0)
Platelets: 311 10*3/uL (ref 150–400)
RBC: 4.97 MIL/uL (ref 4.22–5.81)
RDW: 14.7 % (ref 11.5–15.5)
WBC: 18.6 10*3/uL — ABNORMAL HIGH (ref 4.0–10.5)
nRBC: 0 % (ref 0.0–0.2)

## 2024-04-19 LAB — HEPATIC FUNCTION PANEL
ALT: 41 U/L (ref 0–44)
AST: 110 U/L — ABNORMAL HIGH (ref 15–41)
Albumin: 3 g/dL — ABNORMAL LOW (ref 3.5–5.0)
Alkaline Phosphatase: 202 U/L — ABNORMAL HIGH (ref 38–126)
Bilirubin, Direct: 0.4 mg/dL — ABNORMAL HIGH (ref 0.0–0.2)
Indirect Bilirubin: 0.3 mg/dL (ref 0.3–0.9)
Total Bilirubin: 0.7 mg/dL (ref 0.0–1.2)
Total Protein: 7.1 g/dL (ref 6.5–8.1)

## 2024-04-19 LAB — RESP PANEL BY RT-PCR (RSV, FLU A&B, COVID)  RVPGX2
Influenza A by PCR: NEGATIVE
Influenza B by PCR: NEGATIVE
Resp Syncytial Virus by PCR: NEGATIVE
SARS Coronavirus 2 by RT PCR: NEGATIVE

## 2024-04-19 LAB — GLUCOSE, CAPILLARY: Glucose-Capillary: 208 mg/dL — ABNORMAL HIGH (ref 70–99)

## 2024-04-19 LAB — URINE CULTURE
MICRO NUMBER:: 16646433
Result:: NO GROWTH
SPECIMEN QUALITY:: ADEQUATE

## 2024-04-19 LAB — CK: Total CK: 189 U/L (ref 49–397)

## 2024-04-19 LAB — MRSA NEXT GEN BY PCR, NASAL: MRSA by PCR Next Gen: NOT DETECTED

## 2024-04-19 MED ORDER — IPRATROPIUM-ALBUTEROL 0.5-2.5 (3) MG/3ML IN SOLN
RESPIRATORY_TRACT | Status: AC
  Administered 2024-04-19: 3 mL via RESPIRATORY_TRACT
  Filled 2024-04-19: qty 3

## 2024-04-19 MED ORDER — ENOXAPARIN SODIUM 40 MG/0.4ML IJ SOSY
40.0000 mg | PREFILLED_SYRINGE | INTRAMUSCULAR | Status: DC
  Administered 2024-04-20 – 2024-05-02 (×13): 40 mg via SUBCUTANEOUS
  Filled 2024-04-19 (×13): qty 0.4

## 2024-04-19 MED ORDER — ONDANSETRON HCL 4 MG/2ML IJ SOLN: 4.0000 mg | Freq: Four times a day (QID) | INTRAMUSCULAR | Status: DC | PRN

## 2024-04-19 MED ORDER — ACETAMINOPHEN 500 MG PO TABS
1000.0000 mg | ORAL_TABLET | Freq: Four times a day (QID) | ORAL | Status: DC | PRN
  Administered 2024-04-20 – 2024-04-22 (×7): 1000 mg via ORAL
  Filled 2024-04-19 (×7): qty 2

## 2024-04-19 MED ORDER — IPRATROPIUM-ALBUTEROL 0.5-2.5 (3) MG/3ML IN SOLN: 3.0000 mL | Freq: Once | RESPIRATORY_TRACT | Status: AC

## 2024-04-19 MED ORDER — ATORVASTATIN CALCIUM 80 MG PO TABS
80.0000 mg | ORAL_TABLET | Freq: Every evening | ORAL | Status: DC
  Administered 2024-04-20 – 2024-04-27 (×8): 80 mg via ORAL
  Filled 2024-04-19 (×8): qty 1

## 2024-04-19 MED ORDER — SODIUM CHLORIDE 0.9% FLUSH
3.0000 mL | Freq: Two times a day (BID) | INTRAVENOUS | Status: DC
  Administered 2024-04-20 – 2024-05-02 (×17): 3 mL via INTRAVENOUS

## 2024-04-19 MED ORDER — AZITHROMYCIN 500 MG PO TABS
500.0000 mg | ORAL_TABLET | Freq: Every day | ORAL | Status: AC
  Administered 2024-04-20 – 2024-04-21 (×2): 500 mg via ORAL
  Filled 2024-04-19 (×2): qty 1

## 2024-04-19 MED ORDER — ASPIRIN 81 MG PO TBEC
81.0000 mg | DELAYED_RELEASE_TABLET | Freq: Every day | ORAL | Status: DC
  Administered 2024-04-20 – 2024-05-02 (×13): 81 mg via ORAL
  Filled 2024-04-19 (×13): qty 1

## 2024-04-19 MED ORDER — LACTATED RINGERS IV BOLUS: 1000.0000 mL | Freq: Once | INTRAVENOUS | Status: DC

## 2024-04-19 MED ORDER — METOPROLOL TARTRATE 25 MG PO TABS
25.0000 mg | ORAL_TABLET | Freq: Two times a day (BID) | ORAL | Status: DC
  Administered 2024-04-19 – 2024-05-02 (×26): 25 mg via ORAL
  Filled 2024-04-19 (×26): qty 1

## 2024-04-19 MED ORDER — SODIUM CHLORIDE 0.9 % IV SOLN
500.0000 mg | Freq: Once | INTRAVENOUS | Status: AC
  Administered 2024-04-19: 500 mg via INTRAVENOUS
  Filled 2024-04-19: qty 5

## 2024-04-19 MED ORDER — LACTATED RINGERS IV BOLUS
1000.0000 mL | Freq: Once | INTRAVENOUS | Status: AC
  Administered 2024-04-19: 1000 mL via INTRAVENOUS

## 2024-04-19 MED ORDER — IOHEXOL 350 MG/ML SOLN
75.0000 mL | Freq: Once | INTRAVENOUS | Status: AC | PRN
  Administered 2024-04-19: 75 mL via INTRAVENOUS

## 2024-04-19 MED ORDER — ALBUTEROL SULFATE (2.5 MG/3ML) 0.083% IN NEBU
2.5000 mg | INHALATION_SOLUTION | RESPIRATORY_TRACT | Status: DC | PRN
  Administered 2024-04-20 – 2024-04-26 (×2): 2.5 mg via RESPIRATORY_TRACT
  Filled 2024-04-19 (×3): qty 3

## 2024-04-19 MED ORDER — SODIUM CHLORIDE 0.9 % IV SOLN: INTRAVENOUS | Status: DC

## 2024-04-19 MED ORDER — SODIUM CHLORIDE 0.9 % IV SOLN
1.0000 g | INTRAVENOUS | Status: DC
  Administered 2024-04-20 – 2024-04-21 (×2): 1 g via INTRAVENOUS
  Filled 2024-04-19 (×2): qty 10

## 2024-04-19 MED ORDER — MELATONIN 3 MG PO TABS
6.0000 mg | ORAL_TABLET | Freq: Every evening | ORAL | Status: DC | PRN
  Administered 2024-04-19 – 2024-04-26 (×5): 6 mg via ORAL
  Filled 2024-04-19 (×5): qty 2

## 2024-04-19 MED ORDER — INSULIN ASPART 100 UNIT/ML IJ SOLN
0.0000 [IU] | Freq: Three times a day (TID) | INTRAMUSCULAR | Status: DC
  Administered 2024-04-20 – 2024-04-23 (×6): 1 [IU] via SUBCUTANEOUS
  Administered 2024-04-24: 2 [IU] via SUBCUTANEOUS
  Administered 2024-04-25: 3 [IU] via SUBCUTANEOUS
  Administered 2024-04-26 – 2024-04-28 (×3): 1 [IU] via SUBCUTANEOUS
  Administered 2024-04-28: 2 [IU] via SUBCUTANEOUS
  Administered 2024-04-29 – 2024-05-01 (×2): 1 [IU] via SUBCUTANEOUS
  Administered 2024-05-02: 2 [IU] via SUBCUTANEOUS

## 2024-04-19 MED ORDER — CEFTRIAXONE SODIUM 1 G IJ SOLR
1.0000 g | Freq: Once | INTRAMUSCULAR | Status: AC
  Administered 2024-04-19: 1 g via INTRAVENOUS
  Filled 2024-04-19: qty 10

## 2024-04-19 MED ORDER — PREDNISONE 10 MG PO TABS
5.0000 mg | ORAL_TABLET | Freq: Every day | ORAL | Status: DC
  Administered 2024-04-20 – 2024-05-02 (×13): 5 mg via ORAL
  Filled 2024-04-19 (×13): qty 1

## 2024-04-19 MED ORDER — POLYETHYLENE GLYCOL 3350 17 G PO PACK: 17.0000 g | PACK | Freq: Every day | ORAL | Status: DC | PRN

## 2024-04-19 MED ORDER — ACETAMINOPHEN 500 MG PO TABS
1000.0000 mg | ORAL_TABLET | Freq: Once | ORAL | Status: AC
  Administered 2024-04-19: 1000 mg via ORAL
  Filled 2024-04-19: qty 2

## 2024-04-19 NOTE — ED Notes (Signed)
 [facility] called for transport.

## 2024-04-19 NOTE — Progress Notes (Signed)
 Subjective:    Patient ID: Brandon Dixon, male    DOB: [birth year], 78 y.o.   MRN: [id number]  HPI Pt in for follow up.  He just recently was seen by Dr. Johnny.  HPI Here for two issues, both of which began about 2 weeks ago. He developed a stuffy head with PND, and he had a fever of 101 degrees. After a few days the fever resolved but the congestion moved into his chest. Now he has a cough that produces yellow sputum, he feels tightness in the lower right chest area, and he is very fatigued. He tested negative for Covid yesterday. At the same time he has had some intermittent foam in his urine. There is no burning or urgency. He drinks plenty of water.   Day of visit with Dr. Johnny  Constitutional:  Positive for fatigue and fever.  HENT:  Positive for congestion. Negative for ear pain, postnasal drip and sore throat.   Eyes: Negative.   Respiratory:  Positive for cough and chest tightness. Negative for shortness of breath and wheezing.   Cardiovascular: Negative.   Genitourinary:  Negative for dysuria, flank pain, frequency and urgency.   Assessment & Plan:  He has a likely RLL pneumonia. He is given a shot of Rocephin , and he will follow this with 10 days of Augmentin. He will return in the morning for a CXR. As for the foamy urine, we will send this for a culture.    Below hpi from today visit.  NICOLAOS MITRANO is a 78 year old male who presents with shortness of breath and pneumonia.  He has been experiencing shortness of breath for about a week, which worsened prior to his visit on Monday. He was diagnosed with pneumonia and received a shot of Rocdephin, an antibiotic, to help with the condition. He also started on Augmentin, which has been causing gastrointestinal discomfort.  Worsening despite tx.  His oxygen saturation levels have been dropping to 84% when walking and generally staying below 90% even at rest. His pulse has been elevated, ranging from 113 to 119.  He  underwent a chest x-ray on Tuesday due to the clinic being closed on Monday. There is concern that the pneumonia, initially affecting one lobe, may have expanded to more than one lobe.  He has been experiencing excessive dryness despite drinking fluids, suggesting possible dehydration.  He has a significant smoking history, having smoked for approximately 25 years at about half a pack per day, but quit over 25 years ago. No history of asthma or previous episodes of pneumonia. No wheezing or a whistling sound when breathing.  A urinalysis was performed, and the results are pending. His urine was described as foamy, and it was sent for further testing.   Review of Systems  Constitutional:  Positive for fatigue. Negative for chills.  Respiratory:  Positive for cough, chest tightness and shortness of breath.        See hpi.  Cardiovascular:  Negative for chest pain and palpitations.  Gastrointestinal:  Negative for abdominal pain.  Musculoskeletal:  Negative for back pain.  Skin:  Negative for rash.  Neurological:  Negative for dizziness.  Hematological:  Negative for adenopathy. Does not bruise/bleed easily.    Past Medical History:  Diagnosis Date   ALLERGIC RHINITIS 09/26/2007   BENIGN PROSTATIC HYPERTROPHY 05/27/2007   BPH with urinary obstruction    COLONIC POLYPS, HX OF 05/27/2007   DERMATITIS 08/25/2010   GERD 05/27/2007   HYPERCHOLESTEROLEMIA  09/26/2007   Peripheral vascular disease (HCC)      Social History   Socioeconomic History   Marital status: Married    Spouse name: Not on file   Number of children: Not on file   Years of education: Not on file   Highest education level: Some college, no degree  Occupational History   Not on file  Tobacco Use   Smoking status: Former    Current packs/day: 0.00    Types: Cigarettes    Quit date: 10/19/2001    Years since quitting: 22.5   Smokeless tobacco: Never  Vaping Use   Vaping status: Never Used  Substance and Sexual  Activity   Alcohol use: Yes    Alcohol/week: 6.0 standard drinks of alcohol    Types: 6 Cans of beer per week   Drug use: No   Sexual activity: Not on file  Other Topics Concern   Not on file  Social History Narrative   Works as a Patent attorney    Married    Three daughters    Social Drivers of Corporate investment banker Strain: Low Risk  (08/13/2023)   Overall Financial Resource Strain (CARDIA)    Difficulty of Paying Living Expenses: Not hard at all  Food Insecurity: No Food Insecurity (08/13/2023)   Hunger Vital Sign    Worried About Running Out of Food in the Last Year: Never true    Ran Out of Food in the Last Year: Never true  Transportation Needs: No Transportation Needs (08/13/2023)   PRAPARE - Administrator, Civil Service (Medical): No    Lack of Transportation (Non-Medical): No  Physical Activity: Sufficiently Active (08/13/2023)   Exercise Vital Sign    Days of Exercise per Week: 7 days    Minutes of Exercise per Session: 60 min  Stress: No Stress Concern Present (08/13/2023)   [employer] of Occupational Health - Occupational Stress Questionnaire    Feeling of Stress : Not at all  Social Connections: Socially Integrated (08/13/2023)   Social Connection and Isolation Panel    Frequency of Communication with Friends and Family: More than three times a week    Frequency of Social Gatherings with Friends and Family: More than three times a week    Attends Religious Services: More than 4 times per year    Active Member of Clubs or Organizations: Yes    Attends Banker Meetings: More than 4 times per year    Marital Status: Married  Catering manager Violence: Not At Risk (08/13/2023)   Humiliation, Afraid, Rape, and Kick questionnaire    Fear of Current or Ex-Partner: No    Emotionally Abused: No    Physically Abused: No    Sexually Abused: No    Past Surgical History:  Procedure Laterality Date   CYSTOSCOPY  11/10/2022   per  [facility], Holmium laser enucleation with morcellation   LOWER EXTREMITY ANGIOGRAM Left 04/30/2014   Procedure: LOWER EXTREMITY ANGIOGRAM;  Surgeon: Lonni GORMAN Blade, MD;  Location: [facility] CATH LAB;  Service: Cardiovascular;  Laterality: Left;   ROTATOR CUFF REPAIR     VASECTOMY      Family History  Problem Relation Age of Onset   Heart disease Mother    Heart attack Mother        67    Cancer Father        lung ca   Hypertension Sister    Diabetes Brother    Hypertension Sister  Diabetes Brother    Diabetes Brother    Diabetes Brother    Diabetes Brother    Diabetes Daughter     No Known Allergies  Current Outpatient Medications on File Prior to Visit  Medication Sig Dispense Refill   amoxicillin-clavulanate (AUGMENTIN) 875-125 MG tablet Take 1 tablet by mouth 2 (two) times daily. 20 tablet 0   aspirin  EC 81 MG tablet Take 81 mg by mouth daily.     atorvastatin  (LIPITOR) 80 MG tablet TAKE 1 TABLET EVERY EVENING 90 tablet 3   HYDROcodone  bit-homatropine (HYCODAN) 5-1.5 MG/5ML syrup Take 5 mLs by mouth every 4 (four) hours as needed. 240 mL 0   hydrocortisone ointment 0.5 % Apply 1 application topically daily as needed for itching.     ketoconazole  (NIZORAL ) 2 % cream Apply 1 Application topically daily. 60 g 0   metFORMIN  (GLUCOPHAGE -XR) 500 MG 24 hr tablet Take 1 tablet by mouth once daily with breakfast 90 tablet 0   Multiple Vitamin (MULTI-VITAMIN) tablet Take 1 tablet by mouth daily.     olmesartan  (BENICAR ) 5 MG tablet TAKE 1 TABLET EVERY DAY 90 tablet 3   predniSONE  (DELTASONE ) 5 MG tablet Take 1 tablet (5 mg total) by mouth daily with breakfast. 90 tablet 3   tadalafil  (CIALIS ) 20 MG tablet Take 0.5-1 tablets (10-20 mg total) by mouth every other day as needed for erectile dysfunction. 10 tablet 11   No current facility-administered medications on file prior to visit.    BP 120/60   Pulse (!) 102   Temp 97.8 F (36.6 C)   Resp 20   Ht 5' 11.5 (1.816 m)    Wt 171 lb (77.6 kg)   SpO2 (!) 88%   BMI 23.52 kg/m   Pulse up to 125 at times at rest.     Objective:   Physical Exam  General Mental Status- Alert. General Appearance- Not in acute distress.   Skin General: Color- Normal Color. Moisture- Normal Moisture.  Neck  No JVD.  Chest and Lung Exam Auscultation: Breath Sounds:-Rough breath sounds rt lung fied. Left side clear  Cardiovascular Auscultation:Rythm- tachycardic Murmurs & Other Heart Sounds:Auscultation of the heart reveals- No Murmurs.  Abdomen Inspection:-Inspeection Normal. Palpation/Percussion:Note:No mass. Palpation and Percussion of the abdomen reveal- Non Tender, Non Distended + BS, no rebound or guarding.   Neurologic Cranial Nerve exam:- CN III-XII intact(No nystagmus), symmetric smile. Strength:- 5/5 equal and symmetric strength both upper and lower extremities.   Legs- thin symmetric calves. Negative homans signs.    Assessment & Plan:   Patient Instructions  Pneumonia Diagnosed with pneumonia in one lobe, risk of multi-lobe involvement. Oxygen saturation below 90%, as low as 84% with exertion(but in office 88%. Augmentin causing gastrointestinal upset. Concern for dehydration. - Refer to emergency department for evaluation and treatment. - Probale IV antibiotics, consider steroids? - Hydrate with IV fluids. -called and updated MD in ED.  - Assess response, consider hospital admission. -staff to take you down stairs by wheel chair.  Dehydration Excessive dryness and elevated pulse suggest dehydration, possibly related to pneumonia. - Hydrate with IV fluids in emergency department.  follow up date to be determined after ED evaluation and possible admission.   Levora Werden, PA-C

## 2024-04-19 NOTE — ED Notes (Signed)
 Patient returned from CT. Antibiotics restarted.

## 2024-04-19 NOTE — Progress Notes (Signed)
 Patient has arrived to 2C08 from [facility] via [facility]. Patient on 8L HFNC [place]. A&O x4. From report from [facility] pt a-fib w/RVR , patient upon arrival still in a-fib rate controlled on 80-90's.  Admissions paged and made aware patient has arrived to the unit. Awaiting orders.

## 2024-04-19 NOTE — ED Notes (Signed)
 Antibiotics briefly paused for CT scan

## 2024-04-19 NOTE — ED Notes (Signed)
 Verbal order from provider to pause LR at this time. Provider states he may want to resume later.

## 2024-04-19 NOTE — Telephone Encounter (Signed)
 FYI Only or Action Required?: FYI only for provider.  Patient was last seen in primary care on 04/17/2024 by Johnny Garnette LABOR, MD. Called Nurse Triage reporting Fatigue. Symptoms began a week ago. Interventions attempted: Prescription medications: Augmentin and Robitussin. Symptoms are: gradually worsening.  Triage Disposition: See HCP Within 4 Hours (Or PCP Triage)  Patient/caregiver understands and will follow disposition?: Yes                                  Patient's wife called back in after being disconnected from previous triage RN. Patient was advised to be seen within 4 hours. No availability in PCP office. This RN called CAL. CAL assisted with scheduling same day appointment at another [facility] location.

## 2024-04-19 NOTE — Telephone Encounter (Signed)
 This encounter was created in error - please disregard.

## 2024-04-19 NOTE — ED Provider Notes (Signed)
  EMERGENCY DEPARTMENT AT [facility] Provider Note   CSN: [id number] Arrival date & time: 04/19/24  1431     Patient presents with: Shortness of Breath   Brandon Dixon is a 78 y.o. male.    Shortness of Breath  Patient is a 78 year old male with a past medical history significant for HLD, reflux, atherosclerotic disease of lower extremities  Patient presents emergency room today with complaints of 2 weeks of fatigue, weakness, SOB, cough, congestion, started on abx for RLL PNA w rocephin  Monday and PO abx since however became significantly more dyspneic and was seen in PCP office today and found to be hypoxic.  Sent to emergency room for further evaluation  No history of atrial fibrillation  No recent surgeries, hospitalization, long travel, hemoptysis, estrogen containing OCP, cancer history.  No unilateral leg swelling.  No history of PE or VTE.      Prior to Admission medications   Medication Sig Start Date End Date Taking? Authorizing Provider  amoxicillin-clavulanate (AUGMENTIN) 875-125 MG tablet Take 1 tablet by mouth 2 (two) times daily. 04/17/24   Johnny Garnette LABOR, MD  aspirin  EC 81 MG tablet Take 81 mg by mouth daily.    [provider]  atorvastatin  (LIPITOR) 80 MG tablet TAKE 1 TABLET EVERY EVENING 01/27/24   Nafziger, Darleene, NP  HYDROcodone  bit-homatropine (HYCODAN) 5-1.5 MG/5ML syrup Take 5 mLs by mouth every 4 (four) hours as needed. 04/17/24   Johnny Garnette LABOR, MD  hydrocortisone ointment 0.5 % Apply 1 application topically daily as needed for itching.    [provider]  ketoconazole  (NIZORAL ) 2 % cream Apply 1 Application topically daily. 01/25/24   Nafziger, Darleene, NP  metFORMIN  (GLUCOPHAGE -XR) 500 MG 24 hr tablet Take 1 tablet by mouth once daily with breakfast 03/17/24   Nafziger, Cory, NP  Multiple Vitamin (MULTI-VITAMIN) tablet Take 1 tablet by mouth daily.    [provider]  olmesartan  (BENICAR ) 5 MG tablet TAKE 1  TABLET EVERY DAY 11/05/23   Nafziger, Darleene, NP  predniSONE  (DELTASONE ) 5 MG tablet Take 1 tablet (5 mg total) by mouth daily with breakfast. 11/12/23   Nafziger, Darleene, NP  tadalafil  (CIALIS ) 20 MG tablet Take 0.5-1 tablets (10-20 mg total) by mouth every other day as needed for erectile dysfunction. 12/15/23   Nafziger, Cory, NP    Allergies: Patient has no known allergies.    Review of Systems  Respiratory:  Positive for shortness of breath.     Updated Vital Signs BP (!) 152/79   Pulse 74   Temp (!) 102.6 F (39.2 C) (Rectal)   Resp (!) 21   SpO2 91%   Physical Exam Vitals and nursing note reviewed.  Constitutional:      General: He is not in acute distress.    Appearance: He is ill-appearing.  HENT:     Head: Normocephalic and atraumatic.     Nose: Nose normal.     Mouth/Throat:     Mouth: Mucous membranes are dry.  Eyes:     General: No scleral icterus. Cardiovascular:     Rate and Rhythm: Regular rhythm. Tachycardia present.     Pulses: Normal pulses.     Heart sounds: Normal heart sounds.  Pulmonary:     Effort: Pulmonary effort is normal. No respiratory distress.     Breath sounds: Examination of the right-lower field reveals rales. Rales present. No wheezing.  Abdominal:     Palpations: Abdomen is soft.  Tenderness: There is no abdominal tenderness. There is no guarding or rebound.  Musculoskeletal:     Cervical back: Normal range of motion.     Right lower leg: No edema.     Left lower leg: No edema.  Skin:    General: Skin is warm and dry.     Capillary Refill: Capillary refill takes less than 2 seconds.  Neurological:     Mental Status: He is alert. Mental status is at baseline.  Psychiatric:        Mood and Affect: Mood normal.        Behavior: Behavior normal.     (all labs ordered are listed, but only abnormal results are displayed) Labs Reviewed  CBC - Abnormal; Notable for the following components:      Result Value   WBC 18.6 (*)    All  other components within normal limits  BASIC METABOLIC PANEL WITH GFR - Abnormal; Notable for the following components:   Sodium 129 (*)    Chloride 94 (*)    CO2 21 (*)    Glucose, Bld 302 (*)    Creatinine, Ser 1.41 (*)    Calcium  8.8 (*)    GFR, Estimated 51 (*)    All other components within normal limits  HEPATIC FUNCTION PANEL - Abnormal; Notable for the following components:   Albumin 3.0 (*)    AST 110 (*)    Alkaline Phosphatase 202 (*)    Bilirubin, Direct 0.4 (*)    All other components within normal limits  I-STAT VENOUS BLOOD GAS, ED - Abnormal; Notable for the following components:   pCO2, Ven 39.6 (*)    pO2, Ven 20 (*)    Sodium 132 (*)    Calcium , Ion 1.08 (*)    All other components within normal limits  RESP PANEL BY RT-PCR (RSV, FLU A&B, COVID)  [id number]    EKG: EKG Interpretation Date/Time:  Wednesday April 19 2024 14:44:23 EDT Ventricular Rate:  112 PR Interval:    QRS Duration:  132 QT Interval:  318 QTC Calculation: 434 R Axis:   88  Text Interpretation: Atrial fibrillation with rapid ventricular response Right bundle branch block Abnormal ECG When compared with ECG of 10-Sep-2021 12:27, PREVIOUS ECG IS PRESENT RBBB old Confirmed by Bari Flank [phone]) on 04/19/2024 2:53:51 PM  Radiology: DG Chest 2 View Result Date: 04/18/2024 CLINICAL DATA:  cough with RLL rales EXAM: CHEST - 2 VIEW COMPARISON:  01/16/2008. FINDINGS: The heart size and mediastinal contours are within normal limits. Right basilar opacity, most compatible with pneumonia. Left lung is clear. No pleural effusion or pneumothorax. Evidence of prior left rotator cuff repair with suture anchors in the humeral head. No acute osseous abnormality. IMPRESSION: Right basilar opacity is most compatible with pneumonia. Followup PA and lateral chest X-ray is recommended in 3-4 weeks to ensure resolution and exclude underlying malignancy. Electronically Signed   By: Harrietta Sherry M.D.   On: 04/18/2024  10:37     .Critical Care  Performed by: Neldon Hamp RAMAN, PA Authorized by: Neldon Hamp RAMAN, PA   Critical care provider statement:    Critical care time (minutes):  35   Critical care time was exclusive of:  Separately billable procedures and treating other patients and teaching time   Critical care was necessary to treat or prevent imminent or life-threatening deterioration of the following conditions:  Sepsis   Critical care was time spent personally by me on the following activities:  Development  of treatment plan with patient or surrogate, review of old charts, re-evaluation of patient's condition, pulse oximetry, ordering and review of radiographic studies, ordering and review of laboratory studies, ordering and performing treatments and interventions, obtaining history from patient or surrogate, examination of patient and evaluation of patient's response to treatment   Care discussed with: admitting provider      Medications Ordered in the ED  lactated ringers  bolus 1,000 mL (has no administration in time range)  acetaminophen  (TYLENOL ) tablet 1,000 mg (1,000 mg Oral Given 04/19/24 1544)  lactated ringers  bolus 1,000 mL (0 mLs Intravenous Paused 04/19/24 1558)  ipratropium-albuterol  (DUONEB) 0.5-2.5 (3) MG/3ML nebulizer solution 3 mL (3 mLs Nebulization Given 04/19/24 1554)  cefTRIAXone  (ROCEPHIN ) 1 g in sodium chloride  0.9 % 100 mL IVPB (1 g Intravenous New Bag/Given 04/19/24 1625)  azithromycin (ZITHROMAX) 500 mg in sodium chloride  0.9 % 250 mL IVPB (500 mg Intravenous New Bag/Given 04/19/24 1558)  iohexol (OMNIPAQUE) 350 MG/ML injection 75 mL (75 mLs Intravenous Contrast Given 04/19/24 1713)                                     Medical Decision Making Amount and/or Complexity of Data Reviewed Labs: ordered. Radiology: ordered.  Risk OTC drugs. Prescription drug management. Decision regarding hospitalization.   Patient is a 78 year old male with a past medical history  significant for HLD, reflux, atherosclerotic disease of lower extremities  Patient presents emergency room today with complaints of 2 weeks of fatigue, weakness, SOB, cough, congestion, started on abx for RLL PNA w rocephin  Monday and PO abx since however became significantly more dyspneic and was seen in PCP office today and found to be hypoxic.  Sent to emergency room for further evaluation  No history of atrial fibrillation  No recent surgeries, hospitalization, long travel, hemoptysis, estrogen containing OCP, cancer history.  No unilateral leg swelling.  No history of PE or VTE.  Patient appears dehydrated on exam, right lower lung crackles, mildly tachypneic.  Is unwell appearing.  He is answering questions appropriately following commands however.  Had a right lower lobe lung infiltrate on x-ray several days ago.  Given his worsening symptoms on antibiotics for 3 days we will go ahead and obtain CT PE study, will provide with empiric antibiotics, fluid resuscitation, rectal temperature was found to have a temperature of 102.6 Tylenol  p.o. given  I discussed this case with my attending physician who cosigned this note including patient's presenting symptoms, physical exam, and planned diagnostics and interventions. Attending physician stated agreement with plan or made changes to plan which were implemented.   Attending physician assessed patient at bedside.   COVID and influenza negative, CBC with leukocytosis i-STAT VBG without acidosis.  LFTs reassuring apart from alk phos and AST elevation Also with AKI.  EKG with new onset A-fib in the setting of sepsis secondary to pneumonia.  Admitted to Dr. Silvester of hospitalist service.  Final diagnoses:  Pneumonia of right lower lobe due to infectious organism  Atrial fibrillation, unspecified type [facility])  AKI (acute kidney injury) [facility])    ED Discharge Orders     None          Neldon Hamp RAMAN, [place] 04/19/24 1839    Neysa Caron PARAS, DO 04/19/24 2309

## 2024-04-19 NOTE — ED Triage Notes (Signed)
 Shortness of breath x 2 days , pneumonia . Low sats at PCP , denies chest pain .

## 2024-04-19 NOTE — Patient Instructions (Signed)
 Pneumonia Diagnosed with pneumonia in one lobe, risk of multi-lobe involvement. Oxygen saturation below 90%, as low as 84% with exertion(but in office 88%. Augmentin causing gastrointestinal upset. Concern for dehydration. - Refer to emergency department for evaluation and treatment. - Probale IV antibiotics, consider steroids? - Hydrate with IV fluids. -called and updated MD in ED.  - Assess response, consider hospital admission. -staff to take you down stairs by wheel chair.  Dehydration Excessive dryness and elevated pulse suggest dehydration, possibly related to pneumonia. - Hydrate with IV fluids in emergency department.  follow up date to be determined after ED evaluation and possible admission.

## 2024-04-19 NOTE — Telephone Encounter (Signed)
 Copied from CRM [id number]. Topic: Clinical - Red Word Triage >> Apr 19, 2024  9:33 AM Robinson H wrote: [facility] that prompted transfer to Nurse Triage: Extreme fatigue, just lifeless, not functioning well Reason for Disposition  [1] MODERATE weakness or fatigue AND [2] from poor fluid intake AND [3] no improvement after 2 hours of rest and fluids  Answer Assessment - Initial Assessment Questions 1. DESCRIPTION: Describe how you are feeling.     Extreme fatigue, not functioning  2. SEVERITY: How bad is it?  Can you stand and walk?   - MILD (0-3): Feels weak or tired, but does not interfere with work, school or normal activities.   - MODERATE (4-7): Able to stand and walk; weakness interferes with work, school, or normal activities.   - SEVERE (8-10): Unable to stand or walk; unable to do usual activities.     Mild to moderate  3. ONSET: When did these symptoms begin? (e.g., hours, days, weeks, months)     Late last week. Had a cough for 2 weeks, but started to get worse  4. CAUSE: What do you think is causing the weakness or fatigue? (e.g., not drinking enough fluids, medical problem, trouble sleeping)     Diagnosed with PNA on 6/30. Drinking fluid  5. NEW MEDICINES:  Have you started on any new medicines recently? (e.g., opioid pain medicines, benzodiazepines, muscle relaxants, antidepressants, antihistamines, neuroleptics, beta blockers)     Taking Augmentin and Robitussin  6. OTHER SYMPTOMS: Do you have any other symptoms? (e.g., chest pain, fever, cough, SOB, vomiting, diarrhea, bleeding, other areas of pain)     Still coughing, shortness of breath, trouble sleeping, dry mouth  7. PREGNANCY: Is there any chance you are pregnant? When was your last menstrual period?     *No Answer*  Protocols used: Weakness (Generalized) and Fatigue-A-AH

## 2024-04-20 ENCOUNTER — Inpatient Hospital Stay (HOSPITAL_COMMUNITY)

## 2024-04-20 DIAGNOSIS — R7401 Elevation of levels of liver transaminase levels: Secondary | ICD-10-CM | POA: Insufficient documentation

## 2024-04-20 DIAGNOSIS — I4891 Unspecified atrial fibrillation: Secondary | ICD-10-CM

## 2024-04-20 DIAGNOSIS — J9601 Acute respiratory failure with hypoxia: Secondary | ICD-10-CM | POA: Insufficient documentation

## 2024-04-20 DIAGNOSIS — J189 Pneumonia, unspecified organism: Secondary | ICD-10-CM | POA: Diagnosis not present

## 2024-04-20 DIAGNOSIS — R739 Hyperglycemia, unspecified: Secondary | ICD-10-CM | POA: Insufficient documentation

## 2024-04-20 DIAGNOSIS — N179 Acute kidney failure, unspecified: Secondary | ICD-10-CM | POA: Insufficient documentation

## 2024-04-20 DIAGNOSIS — A419 Sepsis, unspecified organism: Secondary | ICD-10-CM | POA: Insufficient documentation

## 2024-04-20 LAB — CBC
HCT: 39.5 % (ref 39.0–52.0)
Hemoglobin: 13.3 g/dL (ref 13.0–17.0)
MCH: 29.1 pg (ref 26.0–34.0)
MCHC: 33.7 g/dL (ref 30.0–36.0)
MCV: 86.4 fL (ref 80.0–100.0)
Platelets: 296 10*3/uL (ref 150–400)
RBC: 4.57 MIL/uL (ref 4.22–5.81)
RDW: 14.7 % (ref 11.5–15.5)
WBC: 15.9 10*3/uL — ABNORMAL HIGH (ref 4.0–10.5)
nRBC: 0 % (ref 0.0–0.2)

## 2024-04-20 LAB — HEMOGLOBIN A1C
Hgb A1c MFr Bld: 6.6 % — ABNORMAL HIGH (ref 4.8–5.6)
Mean Plasma Glucose: 143 mg/dL

## 2024-04-20 LAB — EXPECTORATED SPUTUM ASSESSMENT W GRAM STAIN, RFLX TO RESP C

## 2024-04-20 LAB — BASIC METABOLIC PANEL WITH GFR
Anion gap: 14 (ref 5–15)
BUN: 22 mg/dL (ref 8–23)
CO2: 21 mmol/L — ABNORMAL LOW (ref 22–32)
Calcium: 8.3 mg/dL — ABNORMAL LOW (ref 8.9–10.3)
Chloride: 100 mmol/L (ref 98–111)
Creatinine, Ser: 1.42 mg/dL — ABNORMAL HIGH (ref 0.61–1.24)
GFR, Estimated: 51 mL/min — ABNORMAL LOW (ref >60)
Glucose, Bld: 195 mg/dL — ABNORMAL HIGH (ref 70–99)
Potassium: 4 mmol/L (ref 3.5–5.1)
Sodium: 135 mmol/L (ref 135–145)

## 2024-04-20 LAB — ECHOCARDIOGRAM COMPLETE
Area-P 1/2: 4.86 cm2
Height: 72 in
S' Lateral: 2.9 cm
Weight: 2747.81 [oz_av]

## 2024-04-20 LAB — HEPATIC FUNCTION PANEL
ALT: 37 U/L (ref 0–44)
AST: 86 U/L — ABNORMAL HIGH (ref 15–41)
Albumin: 2.1 g/dL — ABNORMAL LOW (ref 3.5–5.0)
Alkaline Phosphatase: 149 U/L — ABNORMAL HIGH (ref 38–126)
Bilirubin, Direct: 0.2 mg/dL (ref 0.0–0.2)
Indirect Bilirubin: 0.5 mg/dL (ref 0.3–0.9)
Total Bilirubin: 0.7 mg/dL (ref 0.0–1.2)
Total Protein: 6.5 g/dL (ref 6.5–8.1)

## 2024-04-20 LAB — LACTIC ACID, PLASMA: Lactic Acid, Venous: 3.1 mmol/L (ref 0.5–1.9)

## 2024-04-20 LAB — GLUCOSE, CAPILLARY
Glucose-Capillary: 158 mg/dL — ABNORMAL HIGH (ref 70–99)
Glucose-Capillary: 154 mg/dL — ABNORMAL HIGH (ref 70–99)
Glucose-Capillary: 104 mg/dL — ABNORMAL HIGH (ref 70–99)
Glucose-Capillary: 169 mg/dL — ABNORMAL HIGH (ref 70–99)

## 2024-04-20 LAB — TSH: TSH: 0.742 u[IU]/mL (ref 0.350–4.500)

## 2024-04-20 LAB — MAGNESIUM: Magnesium: 2.2 mg/dL (ref 1.7–2.4)

## 2024-04-20 LAB — PHOSPHORUS: Phosphorus: 3.6 mg/dL (ref 2.5–4.6)

## 2024-04-20 LAB — PROCALCITONIN: Procalcitonin: 6.79 ng/mL

## 2024-04-20 LAB — BRAIN NATRIURETIC PEPTIDE: B Natriuretic Peptide: 692.1 pg/mL — ABNORMAL HIGH (ref 0.0–100.0)

## 2024-04-20 MED ORDER — PERFLUTREN LIPID MICROSPHERE
1.0000 mL | INTRAVENOUS | Status: AC | PRN
  Administered 2024-04-20: 5 mL via INTRAVENOUS

## 2024-04-20 MED ORDER — FUROSEMIDE 10 MG/ML IJ SOLN
40.0000 mg | Freq: Once | INTRAMUSCULAR | Status: AC
  Administered 2024-04-20: 40 mg via INTRAVENOUS
  Filled 2024-04-20: qty 4

## 2024-04-20 MED ORDER — GUAIFENESIN ER 600 MG PO TB12
1200.0000 mg | ORAL_TABLET | Freq: Two times a day (BID) | ORAL | Status: DC
  Administered 2024-04-20 – 2024-05-02 (×25): 1200 mg via ORAL
  Filled 2024-04-20 (×25): qty 2

## 2024-04-20 MED ORDER — IPRATROPIUM-ALBUTEROL 0.5-2.5 (3) MG/3ML IN SOLN
3.0000 mL | Freq: Two times a day (BID) | RESPIRATORY_TRACT | Status: DC
  Administered 2024-04-20 – 2024-04-24 (×9): 3 mL via RESPIRATORY_TRACT
  Filled 2024-04-20 (×9): qty 3

## 2024-04-20 MED ORDER — SENNOSIDES-DOCUSATE SODIUM 8.6-50 MG PO TABS: 1.0000 | ORAL_TABLET | Freq: Every evening | ORAL | Status: DC | PRN

## 2024-04-20 MED ORDER — SODIUM CHLORIDE 0.9 % IV SOLN: INTRAVENOUS | Status: DC

## 2024-04-20 MED ORDER — METOPROLOL TARTRATE 5 MG/5ML IV SOLN: 5.0000 mg | INTRAVENOUS | Status: DC | PRN

## 2024-04-20 MED ORDER — HYDRALAZINE HCL 20 MG/ML IJ SOLN: 10.0000 mg | INTRAMUSCULAR | Status: DC | PRN

## 2024-04-20 NOTE — H&P (Signed)
 History and Physical    Brandon Dixon:[id number] DOB: [birth year] DOA: 04/19/2024  PCP: Merna Huxley, NP   Patient coming from: Tx from [facility] ED    Chief Complaint:  Chief Complaint  Patient presents with   Shortness of Breath    HPI:  Brandon Dixon is a 78 y.o. male with hx of PAD, PMR on chronic prednisone , hypertension, hyperlipidemia, prediabetes, BPH, GERD, who is transferred from [facility] ED for community-acquired pneumonia with acute hypoxic respiratory failure.  He reports that symptoms initially began about 1.5 weeks ago with persistent cough which has been productive with yellow sputum.  Cough has been severe enough to interfere with his sleep.  Has only mild upper respiratory symptoms including rhinorrhea.  No sore throat.  No sick contacts.  Having associated shortness of breath at rest and with any exertion.  Denies any dysphagia.  He has seen his primary care physician on 6/30 and given ceftriaxone  and Rx for Augmentin for 10 days.  After starting on Augmentin has had GI upset and decreased p.o. intake, diarrhea.  Saw primary care again today and noted to have hypoxia to 84% with activity, 88% on room air and sent to the emergency department.   No known hx of afib, no sense of palpitations. No chest pain.    Review of Systems:  ROS complete and negative except as marked above   No Known Allergies  Prior to Admission medications   Medication Sig Start Date End Date Taking? Authorizing Provider  amoxicillin-clavulanate (AUGMENTIN) 875-125 MG tablet Take 1 tablet by mouth 2 (two) times daily. 04/17/24  Yes Johnny Garnette LABOR, MD  aspirin  EC 81 MG tablet Take 81 mg by mouth daily.   Yes [provider]  atorvastatin  (LIPITOR) 80 MG tablet TAKE 1 TABLET EVERY EVENING 01/27/24  Yes Nafziger, Huxley, NP  HYDROcodone  bit-homatropine (HYCODAN) 5-1.5 MG/5ML syrup Take 5 mLs by mouth every 4 (four) hours as needed. Patient taking differently: Take 5 mLs by  mouth every 4 (four) hours as needed for cough. 04/17/24  Yes Johnny Garnette LABOR, MD  hydrocortisone ointment 0.5 % Apply 1 application topically daily as needed for itching.   Yes [provider]  metFORMIN  (GLUCOPHAGE -XR) 500 MG 24 hr tablet Take 1 tablet by mouth once daily with breakfast 03/17/24  Yes Nafziger, Huxley, NP  Multiple Vitamin (MULTI-VITAMIN) tablet Take 1 tablet by mouth daily.   Yes [provider]  olmesartan  (BENICAR ) 5 MG tablet TAKE 1 TABLET EVERY DAY 11/05/23  Yes Nafziger, Huxley, NP  predniSONE  (DELTASONE ) 5 MG tablet Take 1 tablet (5 mg total) by mouth daily with breakfast. 11/12/23  Yes Nafziger, Huxley, NP  tadalafil  (CIALIS ) 20 MG tablet Take 0.5-1 tablets (10-20 mg total) by mouth every other day as needed for erectile dysfunction. 12/15/23  Yes Merna Huxley, NP    Past Medical History:  Diagnosis Date   ALLERGIC RHINITIS 09/26/2007   BENIGN PROSTATIC HYPERTROPHY 05/27/2007   BPH with urinary obstruction    COLONIC POLYPS, HX OF 05/27/2007   DERMATITIS 08/25/2010   GERD 05/27/2007   HYPERCHOLESTEROLEMIA 09/26/2007   Peripheral vascular disease [facility])     Past Surgical History:  Procedure Laterality Date   CYSTOSCOPY  11/10/2022   per [facility], Holmium laser enucleation with morcellation   LOWER EXTREMITY ANGIOGRAM Left 04/30/2014   Procedure: LOWER EXTREMITY ANGIOGRAM;  Surgeon: Lonni GORMAN Blade, MD;  Location: [facility] CATH LAB;  Service: Cardiovascular;  Laterality: Left;   ROTATOR  CUFF REPAIR     VASECTOMY       reports that he quit smoking about 22 years ago. His smoking use included cigarettes. He has never used smokeless tobacco. He reports current alcohol use of about 6.0 standard drinks of alcohol per week. He reports that he does not use drugs.  Family History  Problem Relation Age of Onset   Heart disease Mother    Heart attack Mother        87    Cancer Father        lung ca   Hypertension Sister    Diabetes Brother     Hypertension Sister    Diabetes Brother    Diabetes Brother    Diabetes Brother    Diabetes Brother    Diabetes Daughter      Physical Exam: Vitals:   04/19/24 1845 04/19/24 1945 04/19/24 2050 04/19/24 2300  BP: (!) 146/71 108/62 130/88 131/72  Pulse: (!) 101 96 100 94  Resp: 19 19 15 20   Temp:   98 F (36.7 C) 98.5 F (36.9 C)  TempSrc:   Oral   SpO2: 91% 91% 95% 93%  Weight:   77.9 kg   Height:   6' (1.829 m)     Gen: Awake, alert, NAD   CV: Tachycardic, irregular normal S1, S2, no murmurs  Resp: Normal WOB, on [place], referred bronchial sounds in the right base with few rales Abd: Flat, normoactive, nontender MSK: Symmetric, no edema  Skin: No rashes or lesions to exposed skin  Neuro: Alert and interactive  Psych: euthymic, appropriate    Data review:   Labs reviewed, notable for:   VBG 7.38/39 NA corrected 132, bicarb 21 anion gap 15, creatinine 1.4 (baseline 0.8), glucose 302 AST 110, alk phos 202, other LFT within normal limit CK within normal limit WBC 18  Micro:  Results for orders placed or performed during the hospital encounter of 04/19/24  Resp panel by RT-PCR (RSV, Flu A&B, Covid) Anterior Nasal Swab     Status: None   Collection Time: 04/19/24  2:46 PM   Specimen: Anterior Nasal Swab  Result Value Ref Range Status   SARS Coronavirus 2 by RT PCR NEGATIVE NEGATIVE Final    Comment: (NOTE) SARS-CoV-2 target nucleic acids are NOT DETECTED.  The SARS-CoV-2 RNA is generally detectable in upper respiratory specimens during the acute phase of infection. The lowest concentration of SARS-CoV-2 viral copies this assay can detect is 138 copies/mL. A negative result does not preclude SARS-Cov-2 infection and should not be used as the sole basis for treatment or other patient management decisions. A negative result may occur with  improper specimen collection/handling, submission of specimen other than nasopharyngeal swab, presence of viral mutation(s) within  the areas targeted by this assay, and inadequate number of viral copies(<138 copies/mL). A negative result must be combined with clinical observations, patient history, and epidemiological information. The expected result is Negative.  Fact Sheet for Patients:  BloggerCourse.com  Fact Sheet for Healthcare Providers:  SeriousBroker.it  This test is no t yet approved or cleared by the [place]  FDA and  has been authorized for detection and/or diagnosis of SARS-CoV-2 by FDA under an Emergency Use Authorization (EUA). This EUA will remain  in effect (meaning this test can be used) for the duration of the COVID-19 declaration under Section 564(b)(1) of the Act, 21 U.S.C.section 360bbb-3(b)(1), unless the authorization is terminated  or revoked sooner.       Influenza A by  PCR NEGATIVE NEGATIVE Final   Influenza B by PCR NEGATIVE NEGATIVE Final    Comment: (NOTE) The Xpert Xpress SARS-CoV-2/FLU/RSV plus assay is intended as an aid in the diagnosis of influenza from Nasopharyngeal swab specimens and should not be used as a sole basis for treatment. Nasal washings and aspirates are unacceptable for Xpert Xpress SARS-CoV-2/FLU/RSV testing.  Fact Sheet for Patients: BloggerCourse.com  Fact Sheet for Healthcare Providers: SeriousBroker.it  This test is not yet approved or cleared by the [place]  FDA and has been authorized for detection and/or diagnosis of SARS-CoV-2 by FDA under an Emergency Use Authorization (EUA). This EUA will remain in effect (meaning this test can be used) for the duration of the COVID-19 declaration under Section 564(b)(1) of the Act, 21 U.S.C. section 360bbb-3(b)(1), unless the authorization is terminated or revoked.     Resp Syncytial Virus by PCR NEGATIVE NEGATIVE Final    Comment: (NOTE) Fact Sheet for  Patients: BloggerCourse.com  Fact Sheet for Healthcare Providers: SeriousBroker.it  This test is not yet approved or cleared by the [place]  FDA and has been authorized for detection and/or diagnosis of SARS-CoV-2 by FDA under an Emergency Use Authorization (EUA). This EUA will remain in effect (meaning this test can be used) for the duration of the COVID-19 declaration under Section 564(b)(1) of the Act, 21 U.S.C. section 360bbb-3(b)(1), unless the authorization is terminated or revoked.  Performed at [facility], [address] [place], [place] [address]   MRSA Next Gen by PCR, Nasal     Status: None   Collection Time: 04/19/24  8:57 PM   Specimen: Nasal Mucosa; Nasal Swab  Result Value Ref Range Status   MRSA by PCR Next Gen NOT DETECTED NOT DETECTED Final    Comment: (NOTE) The GeneXpert MRSA Assay (FDA approved for NASAL specimens only), is one component of a comprehensive MRSA colonization surveillance program. It is not intended to diagnose MRSA infection nor to guide or monitor treatment for MRSA infections. Test performance is not FDA approved in patients less than 23 years old. Performed at [facility], [address] [place], [place] [address]     Imaging reviewed:  CT Angio Chest PE W/Cm &/Or Wo Cm Result Date: 04/19/2024 CLINICAL DATA:  Concern for pulmonary embolism. EXAM: CT ANGIOGRAPHY CHEST WITH CONTRAST TECHNIQUE: Multidetector CT imaging of the chest was performed using the standard protocol during bolus administration of intravenous contrast. Multiplanar CT image reconstructions and MIPs were obtained to evaluate the vascular anatomy. RADIATION DOSE REDUCTION: This exam was performed according to the departmental dose-optimization program which includes automated exposure control, adjustment of the mA and/or kV according to patient size and/or use of iterative reconstruction technique.  CONTRAST:  75mL OMNIPAQUE IOHEXOL 350 MG/ML SOLN COMPARISON:  Chest radiograph dated 04/18/2024. FINDINGS: Cardiovascular: There is no cardiomegaly or pericardial effusion. The thoracic aorta is unremarkable. The origins of the great vessels of the aortic arch appear patent. No pulmonary artery embolus identified. Mediastinum/Nodes: No obvious adenopathy. Evaluation however is limited due to consolidative changes of the right lung. The esophagus is grossly unremarkable. No mediastinal fluid collection. Lungs/Pleura: Large area of consolidation involving the right lung with near complete consolidation of the right lower lobe most consistent with pneumonia. Underlying mass is not excluded clinical correlation and follow-up after treatment recommended to document resolution. There is an 8 mm nodule in the lingula. Attention on follow-up imaging recommended. There is a small right pleural effusion. No pneumothorax. The central airways  are patent. Upper Abdomen: No acute abnormality. Musculoskeletal: Degenerative changes of the spine. No acute osseous pathology. Review of the MIP images confirms the above findings. IMPRESSION: 1. No CT evidence of pulmonary artery embolus. 2. Large area of consolidation involving the right lung most consistent with pneumonia. Clinical correlation and follow-up after treatment recommended to document resolution. 3. Small right pleural effusion. 4. An 8 mm nodule in the lingula. Attention on follow-up imaging recommended. Electronically Signed   By: Vanetta Chou M.D.   On: 04/19/2024 18:14   DG Chest 2 View Result Date: 04/18/2024 CLINICAL DATA:  cough with RLL rales EXAM: CHEST - 2 VIEW COMPARISON:  01/16/2008. FINDINGS: The heart size and mediastinal contours are within normal limits. Right basilar opacity, most compatible with pneumonia. Left lung is clear. No pleural effusion or pneumothorax. Evidence of prior left rotator cuff repair with suture anchors in the humeral head. No  acute osseous abnormality. IMPRESSION: Right basilar opacity is most compatible with pneumonia. Followup PA and lateral chest X-ray is recommended in 3-4 weeks to ensure resolution and exclude underlying malignancy. Electronically Signed   By: Harrietta Sherry M.D.   On: 04/18/2024 10:37    EKG:  Personally reviewed, A-fib, RVR at 112, RBBB, no overt ischemic changes.  ED Course:  Treated with ceftriaxone , azithromycin, 2 L IV fluid, nebs, Tylenol .   Assessment/Plan:  78 y.o. male with hx PAD, PMR on chronic prednisone , hypertension, hyperlipidemia, prediabetes, BPH, GERD, who is transferred from [facility] ED for community-acquired pneumonia with acute hypoxic respiratory failure.   Community-acquired pneumonia Acute hypoxic respiratory failure, secondary to above, on 8L  Sepsis, secondary to above, present on admission  P/w 1.5 weeks productive cough, progressive course. Seen by PCP 6/30 and treated with CTX, started on Augmentin (poor tolerance 2/2 GI adverse effects). Then back to PCP 7/2 found to have hypoxia 88% RA, 84% with exertion. Seen At [facility] ED and Tx to [facility].  Febrile to Tmax 30 9.2C, tachycardic up to the 130s (A-fib), RR mid 20s, currently on 8 L midflow.  Flu/COVID/RSV negative.  Imaging with CTA chest demonstrating consolidation involving all lobes on the R but worst in the RLL with near complete consolidation.  - Continue ceftriaxone  1 g IV every 24 hours, azithromycin 500 mg daily; if worsening course consider broadening coverage - Check sputum culture, MRSA nares, Legionella + strep pneumo ur; hold off on RVP as most consistent with bacterial pneumonia.  Draw blood cultures, though limited yield with delay after abx.  - S/p 2 L IV fluid, started on 100 cc an hour MIVF check lactate  - albuterol  every 4 hours as needed, incentive spirometer, flutter valve, encourage out of bed to chair. - Home O2 evaluation prior to discharge - Recommend repeat CXR outpatient in 6  to 8 weeks to document resolution and exclude underlying lesion.   Atrial fibrillation, new onset, initially RVR now rate controlled No prior history of A-fib, or hx of heart disease. Initially rates into the 130s, now improved rate controlled with IV fluid rate ~ 100.  CHA2DS2-VASc is 48 (age, hypertension, vascular disease).  - Start metoprolol 25 mg twice daily for rate control - Discussed recommendation for anticoagulation given elevated stroke risk.  He would like to think this over more but may be open to starting on anticoagulation in the morning.  Please readdress.  -Check TSH - TTE to evaluate for underlying structural heart disease predisposing to A-fib  Acute kidney injury stage I Baseline  creatinine 0.8, elevated 1.4 on admission.  Likely prerenal in the setting of infection, GI losses. - IV fluids per above - Check PVR - Hold ARB  Acute liver injury, mixed pattern AST 110, alk phos 202, other LFT normal.  May be related to underlying systemic illness. - Trend LFT  History of prediabetes Hyperglycemia Chronic steroid use Home regimen metformin  -Check A1c - SSI for very sensitive  Hyponatremia, mild, resolved NA initially 132 corrected.  Improved and resolved this morning after IV fluids consistent with hypovolemic with low solute.  Incidental findings: 8 mm nodule in the lingula: Outpatient surveillance imaging  Chronic medical problems: PAD: Continue home aspirin , atorvastatin  PMR: Continue home prednisone  5 mg daily Hypertension: Hold home olmesartan  in setting of AKI BPH: Noted checking PVR GERD: Not on treatment for this    Body mass index is 23.29 kg/m.    DVT prophylaxis:  Lovenox Code Status:  Full Code Diet:  Diet Orders (From admission, onward)     Start     Ordered   04/19/24 2230  Diet Carb Modified Fluid consistency: Thin; Room service appropriate? Yes  Diet effective now       Question Answer Comment  Diet-HS Snack? Nothing   Calorie Level  Medium 1600-2000   Fluid consistency: Thin   Room service appropriate? Yes      04/19/24 2233           Family Communication: Yes discussed with his wife at the bedside Consults: None Admission status:   Inpatient, Step Down Unit  Severity of Illness: The appropriate patient status for this patient is INPATIENT. Inpatient status is judged to be reasonable and necessary in order to provide the required intensity of service to ensure the patient's safety. The patient's presenting symptoms, physical exam findings, and initial radiographic and laboratory data in the context of their chronic comorbidities is felt to place them at high risk for further clinical deterioration. Furthermore, it is not anticipated that the patient will be medically stable for discharge from the hospital within 2 midnights of admission.   * I certify that at the point of admission it is my clinical judgment that the patient will require inpatient hospital care spanning beyond 2 midnights from the point of admission due to high intensity of service, high risk for further deterioration and high frequency of surveillance required.*   Dorn Dawson, MD [facility]  How to contact the [facility] Attending or Consulting provider 7A - 7P or covering provider during after hours 7P -7A, for this patient.  Check the care team in [facility] and look for a) attending/consulting [facility] provider listed and b) the [facility] team listed Log into [email or web address] and use [place]'s universal password to access. If you do not have the password, please contact the hospital operator. Locate the [facility] provider you are looking for under [facility] and page to a number that you can be directly reached. If you still have difficulty reaching the provider, please page the [facility] (Director on Call) for the Hospitalists listed on amion for assistance.  04/20/2024, 4:16 AM

## 2024-04-20 NOTE — TOC CM/SW Note (Signed)
 Transition of Care [facility]) - Inpatient Brief Assessment   Patient Details  Name: Brandon Dixon MRN: [id number] Date of Birth: 1945-10-30  [facility] of [facility]) CM/SW Contact:    Lauraine FORBES Saa, LCSW Phone Number: 04/20/2024, 10:17 AM   Clinical Narrative:  10:17 AM Per chart review, patient resides at home with spouse. Patient has a PCP and insurance. Patient does not have SNF/HH/DME history. Patient's preferred pharmacy's are [facility] and [facility] [address]. No TOC needs were identified at this time. TOC will continue to follow and be available to assist.  Transition of Care Asessment: Insurance and Status: Insurance coverage has been reviewed Patient has primary care physician: Yes Home environment has been reviewed: Private Residence Prior level of function:: N/A Prior/Current Home Services: No current home services Social Drivers of Health Review: SDOH reviewed no interventions necessary Readmission risk has been reviewed: Yes Transition of care needs: no transition of care needs at this time

## 2024-04-20 NOTE — Progress Notes (Signed)
 Date and time results received: 04/20/24 1053  Test: Lactic Critical Value: 3.1  Name of Provider Notified: Caleen, MD  Orders Received? Or Actions Taken?: No new orders

## 2024-04-20 NOTE — Hospital Course (Addendum)
 Brief Narrative:  78 y.o. male with hx of PAD, PMR on chronic prednisone , hypertension, hyperlipidemia, prediabetes, BPH, GERD, who is transferred from [facility] ED for community-acquired pneumonia with acute hypoxic respiratory failure.  Ongoing for almost 2 weeks, seen by outpatient PCP on 6/30 and was given IM Rocephin  followed by 10 days of Augmentin .  Augmentin  caused GI upset but went to go see PCP again were noted to be hypoxic therefore sent to the ER.  In the ER noted to have right lower lobe near complete consolidation on the CTA chest.  Started on IV Rocephin  and azithromycin .  Initially also in A-fib with RVR, new onset therefore started on p.o. metoprolol . Hospital course also complicated by fluid overload requiring IV Lasix .  Slowly he was weaned off oxygen down to room air.   Assessment & Plan:  Principal Problem:   CAP (community acquired pneumonia) Active Problems:   Acute hypoxic respiratory failure (HCC)   Sepsis (HCC)   A-fib (HCC)   AKI (acute kidney injury) (HCC)   Transaminitis   Hyperglycemia     Community-acquired pneumonia Acute hypoxic respiratory failure, secondary to above, on 8L  Sepsis, secondary to above, present on admission  Pleural effusion Failed outpatient treatment with Rocephin /Augmentin .   CTA shows near right lower lobe complete consolidation and his hypoxic. Now with worsening O2 requirement.  Thoracentesis attempted but trace fluid therefore no procedure performed. - Continue IV Rocephin  and azithromycin  - Procalcitonin and BNP elevated.  Continue bronchodilators/I-S/flutter valve/Mucinex  - Renal function improved, give Lasix   Due to persistent leukocytosis, will order CT abdomen pelvis with contrast  Atrial fibrillation, new onset, initially RVR now rate controlled New onset, no prior history.  TSH ordered.  Now back in normal sinus rhythm, echocardiogram shows preserved EF.  Patient hesitant to start anticoagulation at this time,  he would like to discuss with outpatient PCP.  He is agreeable to get ambulatory monitor upon discharge. - Continue metoprolol  twice daily   Elevated BNP -Echo  shows preserved ef, received lasix .    Acute kidney injury stage I Baseline creatinine 0.8, elevated 1.4 on admission.  Creatinine now stable around 1.2   Acute liver injury, mixed pattern AST 110, alk phos 202, other LFT normal.  May be related to underlying systemic illness. - Trend LFT  Essential hypertension - Benicar  on hold due to mild AKI, IV as needed   History of prediabetes Hyperglycemia Chronic steroid use Hyperlipidemia Home regimen metformin  Accu-Cheks and sliding scale - A1c 6.6   Hyponatremia, mild, resolved Resolved and stable  Polymyalgia rheumatica - On prednisone  5 mg daily   Incidental findings: 8 mm nodule in the lingula: Outpatient surveillance imaging   PT/OT  DVT prophylaxis: enoxaparin  (LOVENOX ) injection 40 mg Start: 04/20/24 1000    Code Status: Full Code Family Communication: Family at bedside    Subjective: Seen at bedside, feeling better.  Fever curve has improved   Examination:  General exam: Appears calm and comfortable  Respiratory system: Some rhonchi especially on the right side Cardiovascular system: S1 & S2 heard, RRR. No JVD, murmurs, rubs, gallops or clicks. No pedal edema. Gastrointestinal system: Abdomen is nondistended, soft and nontender. No organomegaly or masses felt. Normal bowel sounds heard. Central nervous system: Alert and oriented. No focal neurological deficits. Extremities: Symmetric 5 x 5 power. Skin: No rashes, lesions or ulcers Psychiatry: Judgement and insight appear normal. Mood & affect appropriate.

## 2024-04-20 NOTE — Progress Notes (Signed)
 PROGRESS NOTE    Brandon Dixon  FMW:[id number] DOB: 08-21-1946 DOA: 04/19/2024 PCP: Merna Huxley, NP    Brief Narrative:  78 y.o. male with hx of PAD, PMR on chronic prednisone , hypertension, hyperlipidemia, prediabetes, BPH, GERD, who is transferred from [facility] ED for community-acquired pneumonia with acute hypoxic respiratory failure.  Ongoing for almost 2 weeks, seen by outpatient PCP on 6/30 and was given IM Rocephin  followed by 10 days of Augmentin.  Augmentin caused GI upset but went to go see PCP again were noted to be hypoxic therefore sent to the ER.  In the ER noted to have right lower lobe near complete consolidation on the CTA chest.  Started on IV Rocephin  and azithromycin.  Initially also in A-fib with RVR, new onset therefore started on p.o. metoprolol.   Assessment & Plan:  Principal Problem:   CAP (community acquired pneumonia) Active Problems:   Acute hypoxic respiratory failure (HCC)   Sepsis (HCC)   A-fib (HCC)   AKI (acute kidney injury) (HCC)   Transaminitis   Hyperglycemia     Community-acquired pneumonia Acute hypoxic respiratory failure, secondary to above, on 8L  Sepsis, secondary to above, present on admission  Failed outpatient treatment with Rocephin /Augmentin.  Admitted to the hospital.  CTA shows near right lower lobe complete consolidation and his hypoxic.  Will need repeat two-view chest x-ray or CT scan in about 2 months - Continue IV Rocephin  and azithromycin - Check procalcitonin, BNP, sputum culture, MRSA, Legionella/strep -Bronchodilators, I-S/flutter valve, Mucinex  Atrial fibrillation, new onset, initially RVR now rate controlled New onset, no prior history.  TSH ordered.  Now back in normal sinus rhythm, echocardiogram pending.  Patient hesitant to start anticoagulation at this time, he would like to discuss with outpatient PCP.  He is agreeable to get ambulatory monitor upon discharge. - Can sinew metoprolol   Acute kidney  injury stage I Baseline creatinine 0.8, elevated 1.4 on admission.  Continue IV fluids   Acute liver injury, mixed pattern AST 110, alk phos 202, other LFT normal.  May be related to underlying systemic illness. - Trend LFT  Essential hypertension - Benicar  on hold due to mild AKI, IV as needed   History of prediabetes Hyperglycemia Chronic steroid use Hyperlipidemia Home regimen metformin  -Check A1c - SSI for very sensitive -Statin   Hyponatremia, mild, resolved Resolved and stable  Polymyalgia rheumatica - On prednisone  5 mg daily   Incidental findings: 8 mm nodule in the lingula: Outpatient surveillance imaging    DVT prophylaxis: enoxaparin (LOVENOX) injection 40 mg Start: 04/20/24 1000    Code Status: Full Code Family Communication: Family at bedside During hospital stay for management of hypoxia   Subjective: Tells me shortness of breath is better Appears to be normal sinus rhythm this morning   Examination:  General exam: Appears calm and comfortable  Respiratory system: Some rhonchi especially on the right side Cardiovascular system: S1 & S2 heard, RRR. No JVD, murmurs, rubs, gallops or clicks. No pedal edema. Gastrointestinal system: Abdomen is nondistended, soft and nontender. No organomegaly or masses felt. Normal bowel sounds heard. Central nervous system: Alert and oriented. No focal neurological deficits. Extremities: Symmetric 5 x 5 power. Skin: No rashes, lesions or ulcers Psychiatry: Judgement and insight appear normal. Mood & affect appropriate.                Diet Orders (From admission, onward)     Start     Ordered   04/19/24 2230  Diet Carb Modified Fluid consistency: Thin; Room service appropriate? Yes  Diet effective now       Question Answer Comment  Diet-HS Snack? Nothing   Calorie Level Medium 1600-2000   Fluid consistency: Thin   Room service appropriate? Yes      04/19/24 2233            Objective: Vitals:    04/19/24 1945 04/19/24 2050 04/19/24 2300 04/20/24 0754  BP: 108/62 130/88 131/72 129/77  Pulse: 96 100 94 (!) 107  Resp: 19 15 20 20   Temp:  98 F (36.7 C) 98.5 F (36.9 C) 98.7 F (37.1 C)  TempSrc:  Oral  Oral  SpO2: 91% 95% 93% 91%  Weight:  77.9 kg    Height:  6' (1.829 m)      Intake/Output Summary (Last 24 hours) at 04/20/2024 1052 Last data filed at 04/20/2024 0900 Gross per 24 hour  Intake 1700.06 ml  Output --  Net 1700.06 ml   Filed Weights   04/19/24 2050  Weight: 77.9 kg    Scheduled Meds:  aspirin  EC  81 mg Oral Daily   atorvastatin   80 mg Oral QPM   azithromycin  500 mg Oral Daily   enoxaparin (LOVENOX) injection  40 mg Subcutaneous Q24H   guaiFENesin  1,200 mg Oral BID   insulin aspart  0-6 Units Subcutaneous TID WC   ipratropium-albuterol   3 mL Nebulization BID   metoprolol tartrate  25 mg Oral BID   predniSONE   5 mg Oral Q breakfast   sodium chloride  flush  3 mL Intravenous Q12H   Continuous Infusions:  sodium chloride  100 mL/hr at 04/20/24 0039   sodium chloride  100 mL/hr at 04/20/24 0835   cefTRIAXone  (ROCEPHIN )  IV      Nutritional status     Body mass index is 23.29 kg/m.  Data Reviewed:   CBC: Recent Labs  Lab 04/19/24 1530 04/19/24 1536 04/20/24 0205  WBC 18.6*  --  15.9*  HGB 14.6 15.6 13.3  HCT 43.0 46.0 39.5  MCV 86.5  --  86.4  PLT 311  --  296   Basic Metabolic Panel: Recent Labs  Lab 04/19/24 1530 04/19/24 1536 04/20/24 0205  NA 129* 132* 135  K 4.4 4.5 4.0  CL 94*  --  100  CO2 21*  --  21*  GLUCOSE 302*  --  195*  BUN 23  --  22  CREATININE 1.41*  --  1.42*  CALCIUM  8.8*  --  8.3*  MG  --   --  2.2  PHOS  --   --  3.6   GFR: Estimated Creatinine Clearance: 47.1 mL/min (A) (by C-G formula based on SCr of 1.42 mg/dL (H)). Liver Function Tests: Recent Labs  Lab 04/19/24 1530  AST 110*  ALT 41  ALKPHOS 202*  BILITOT 0.7  PROT 7.1  ALBUMIN 3.0*   No results for input(s): LIPASE, AMYLASE in the  last 168 hours. No results for input(s): AMMONIA in the last 168 hours. Coagulation Profile: No results for input(s): INR, PROTIME in the last 168 hours. Cardiac Enzymes: Recent Labs  Lab 04/19/24 1530  CKTOTAL 189   BNP (last 3 results) No results for input(s): PROBNP in the last 8760 hours. HbA1C: No results for input(s): HGBA1C in the last 72 hours. CBG: Recent Labs  Lab 04/19/24 2258 04/20/24 0612  GLUCAP 208* 158*   Lipid Profile: No results for input(s): CHOL, HDL, LDLCALC, TRIG, CHOLHDL, LDLDIRECT in the last  72 hours. Thyroid  Function Tests: No results for input(s): TSH, T4TOTAL, FREET4, T3FREE, THYROIDAB in the last 72 hours. Anemia Panel: No results for input(s): VITAMINB12, FOLATE, FERRITIN, TIBC, IRON, RETICCTPCT in the last 72 hours. Sepsis Labs: Recent Labs  Lab 04/20/24 0905  LATICACIDVEN 3.1*    Recent Results (from the past 240 hours)  Urine Culture     Status: None   Collection Time: 04/18/24  7:24 AM   Specimen: Urine  Result Value Ref Range Status   MICRO NUMBER: [id number]  Final   SPECIMEN QUALITY: Adequate  Final   Sample Source URINE  Final   STATUS: FINAL  Final   Result: No Growth  Final  Resp panel by RT-PCR (RSV, Flu A&B, Covid) Anterior Nasal Swab     Status: None   Collection Time: 04/19/24  2:46 PM   Specimen: Anterior Nasal Swab  Result Value Ref Range Status   SARS Coronavirus 2 by RT PCR NEGATIVE NEGATIVE Final    Comment: (NOTE) SARS-CoV-2 target nucleic acids are NOT DETECTED.  The SARS-CoV-2 RNA is generally detectable in upper respiratory specimens during the acute phase of infection. The lowest concentration of SARS-CoV-2 viral copies this assay can detect is 138 copies/mL. A negative result does not preclude SARS-Cov-2 infection and should not be used as the sole basis for treatment or other patient management decisions. A negative result may occur with  improper specimen  collection/handling, submission of specimen other than nasopharyngeal swab, presence of viral mutation(s) within the areas targeted by this assay, and inadequate number of viral copies(<138 copies/mL). A negative result must be combined with clinical observations, patient history, and epidemiological information. The expected result is Negative.  Fact Sheet for Patients:  BloggerCourse.com  Fact Sheet for Healthcare Providers:  SeriousBroker.it  This test is no t yet approved or cleared by the [place]  FDA and  has been authorized for detection and/or diagnosis of SARS-CoV-2 by FDA under an Emergency Use Authorization (EUA). This EUA will remain  in effect (meaning this test can be used) for the duration of the COVID-19 declaration under Section 564(b)(1) of the Act, 21 U.S.C.section 360bbb-3(b)(1), unless the authorization is terminated  or revoked sooner.       Influenza A by PCR NEGATIVE NEGATIVE Final   Influenza B by PCR NEGATIVE NEGATIVE Final    Comment: (NOTE) The Xpert Xpress SARS-CoV-2/FLU/RSV plus assay is intended as an aid in the diagnosis of influenza from Nasopharyngeal swab specimens and should not be used as a sole basis for treatment. Nasal washings and aspirates are unacceptable for Xpert Xpress SARS-CoV-2/FLU/RSV testing.  Fact Sheet for Patients: BloggerCourse.com  Fact Sheet for Healthcare Providers: SeriousBroker.it  This test is not yet approved or cleared by the [place]  FDA and has been authorized for detection and/or diagnosis of SARS-CoV-2 by FDA under an Emergency Use Authorization (EUA). This EUA will remain in effect (meaning this test can be used) for the duration of the COVID-19 declaration under Section 564(b)(1) of the Act, 21 U.S.C. section 360bbb-3(b)(1), unless the authorization is terminated or revoked.     Resp Syncytial  Virus by PCR NEGATIVE NEGATIVE Final    Comment: (NOTE) Fact Sheet for Patients: BloggerCourse.com  Fact Sheet for Healthcare Providers: SeriousBroker.it  This test is not yet approved or cleared by the [place]  FDA and has been authorized for detection and/or diagnosis of SARS-CoV-2 by FDA under an Emergency Use Authorization (EUA). This EUA will remain in effect (meaning this test can be used)  for the duration of the COVID-19 declaration under Section 564(b)(1) of the Act, 21 U.S.C. section 360bbb-3(b)(1), unless the authorization is terminated or revoked.  Performed at [facility], [address] [place], [place] [address]   MRSA Next Gen by PCR, Nasal     Status: None   Collection Time: 04/19/24  8:57 PM   Specimen: Nasal Mucosa; Nasal Swab  Result Value Ref Range Status   MRSA by PCR Next Gen NOT DETECTED NOT DETECTED Final    Comment: (NOTE) The GeneXpert MRSA Assay (FDA approved for NASAL specimens only), is one component of a comprehensive MRSA colonization surveillance program. It is not intended to diagnose MRSA infection nor to guide or monitor treatment for MRSA infections. Test performance is not FDA approved in patients less than 39 years old. Performed at [facility], [address], [place], [place] [address]          Radiology Studies: CT Angio Chest PE W/Cm &/Or Wo Cm Result Date: 04/19/2024 CLINICAL DATA:  Concern for pulmonary embolism. EXAM: CT ANGIOGRAPHY CHEST WITH CONTRAST TECHNIQUE: Multidetector CT imaging of the chest was performed using the standard protocol during bolus administration of intravenous contrast. Multiplanar CT image reconstructions and MIPs were obtained to evaluate the vascular anatomy. RADIATION DOSE REDUCTION: This exam was performed according to the departmental dose-optimization program which includes automated exposure control, adjustment of the mA and/or  kV according to patient size and/or use of iterative reconstruction technique. CONTRAST:  75mL OMNIPAQUE IOHEXOL 350 MG/ML SOLN COMPARISON:  Chest radiograph dated 04/18/2024. FINDINGS: Cardiovascular: There is no cardiomegaly or pericardial effusion. The thoracic aorta is unremarkable. The origins of the great vessels of the aortic arch appear patent. No pulmonary artery embolus identified. Mediastinum/Nodes: No obvious adenopathy. Evaluation however is limited due to consolidative changes of the right lung. The esophagus is grossly unremarkable. No mediastinal fluid collection. Lungs/Pleura: Large area of consolidation involving the right lung with near complete consolidation of the right lower lobe most consistent with pneumonia. Underlying mass is not excluded clinical correlation and follow-up after treatment recommended to document resolution. There is an 8 mm nodule in the lingula. Attention on follow-up imaging recommended. There is a small right pleural effusion. No pneumothorax. The central airways are patent. Upper Abdomen: No acute abnormality. Musculoskeletal: Degenerative changes of the spine. No acute osseous pathology. Review of the MIP images confirms the above findings. IMPRESSION: 1. No CT evidence of pulmonary artery embolus. 2. Large area of consolidation involving the right lung most consistent with pneumonia. Clinical correlation and follow-up after treatment recommended to document resolution. 3. Small right pleural effusion. 4. An 8 mm nodule in the lingula. Attention on follow-up imaging recommended. Electronically Signed   By: Vanetta Chou M.D.   On: 04/19/2024 18:14           LOS: 1 day   Time spent= 35 mins    Burgess JAYSON Dare, MD [facility]  If 7PM-7AM, please contact night-coverage  04/20/2024, 10:52 AM

## 2024-04-20 NOTE — Progress Notes (Signed)
 Patient oxygen saturation dropped to mid 80's. Increased oxygen to 14L HFNC. Patient satting at 90-92% on 14L. Axillary temp 99.9, family requesting rectal temperature be done. Rectal temperature 103.9. Amin, MD made aware, new orders to stop IV fluids, give tylenol  and lasix, ordered chest xray. All orders implemented. Rapid Response RN notified per red MEWS protocol, stated she would come by and see patient shortly and to put ice bags around patient. Rapid Response RN arrived a few minutes later and will continue to monitor patient and to call if any changes.

## 2024-04-21 ENCOUNTER — Inpatient Hospital Stay (HOSPITAL_COMMUNITY)

## 2024-04-21 DIAGNOSIS — J189 Pneumonia, unspecified organism: Secondary | ICD-10-CM | POA: Diagnosis not present

## 2024-04-21 LAB — COMPREHENSIVE METABOLIC PANEL WITH GFR
ALT: 37 U/L (ref 0–44)
AST: 89 U/L — ABNORMAL HIGH (ref 15–41)
Albumin: 1.9 g/dL — ABNORMAL LOW (ref 3.5–5.0)
Alkaline Phosphatase: 148 U/L — ABNORMAL HIGH (ref 38–126)
Anion gap: 13 (ref 5–15)
BUN: 29 mg/dL — ABNORMAL HIGH (ref 8–23)
CO2: 21 mmol/L — ABNORMAL LOW (ref 22–32)
Calcium: 8.3 mg/dL — ABNORMAL LOW (ref 8.9–10.3)
Chloride: 100 mmol/L (ref 98–111)
Creatinine, Ser: 1.16 mg/dL (ref 0.61–1.24)
GFR, Estimated: >60 mL/min (ref >60)
Glucose, Bld: 120 mg/dL — ABNORMAL HIGH (ref 70–99)
Potassium: 4 mmol/L (ref 3.5–5.1)
Sodium: 134 mmol/L — ABNORMAL LOW (ref 135–145)
Total Bilirubin: 0.9 mg/dL (ref 0.0–1.2)
Total Protein: 6.3 g/dL — ABNORMAL LOW (ref 6.5–8.1)

## 2024-04-21 LAB — GLUCOSE, CAPILLARY
Glucose-Capillary: 89 mg/dL (ref 70–99)
Glucose-Capillary: 125 mg/dL — ABNORMAL HIGH (ref 70–99)
Glucose-Capillary: 169 mg/dL — ABNORMAL HIGH (ref 70–99)
Glucose-Capillary: 131 mg/dL — ABNORMAL HIGH (ref 70–99)

## 2024-04-21 LAB — CBC
HCT: 39.9 % (ref 39.0–52.0)
Hemoglobin: 13.4 g/dL (ref 13.0–17.0)
MCH: 29.6 pg (ref 26.0–34.0)
MCHC: 33.6 g/dL (ref 30.0–36.0)
MCV: 88.1 fL (ref 80.0–100.0)
Platelets: 244 K/uL (ref 150–400)
RBC: 4.53 MIL/uL (ref 4.22–5.81)
RDW: 15.1 % (ref 11.5–15.5)
WBC: 21.1 K/uL — ABNORMAL HIGH (ref 4.0–10.5)
nRBC: 0 % (ref 0.0–0.2)

## 2024-04-21 LAB — MAGNESIUM: Magnesium: 2.5 mg/dL — ABNORMAL HIGH (ref 1.7–2.4)

## 2024-04-21 LAB — EXPECTORATED SPUTUM ASSESSMENT W GRAM STAIN, RFLX TO RESP C

## 2024-04-21 LAB — PHOSPHORUS: Phosphorus: 3.6 mg/dL (ref 2.5–4.6)

## 2024-04-21 MED ORDER — FUROSEMIDE 10 MG/ML IJ SOLN
40.0000 mg | Freq: Once | INTRAMUSCULAR | Status: AC
  Administered 2024-04-21: 40 mg via INTRAVENOUS
  Filled 2024-04-21: qty 4

## 2024-04-21 NOTE — Progress Notes (Signed)
 PROGRESS NOTE    Brandon Dixon  FMW:[id number] DOB: 01/24/1946 DOA: 04/19/2024 PCP: Merna Huxley, NP    Brief Narrative:  78 y.o. male with hx of PAD, PMR on chronic prednisone , hypertension, hyperlipidemia, prediabetes, BPH, GERD, who is transferred from [facility] ED for community-acquired pneumonia with acute hypoxic respiratory failure.  Ongoing for almost 2 weeks, seen by outpatient PCP on 6/30 and was given IM Rocephin  followed by 10 days of Augmentin .  Augmentin  caused GI upset but went to go see PCP again were noted to be hypoxic therefore sent to the ER.  In the ER noted to have right lower lobe near complete consolidation on the CTA chest.  Started on IV Rocephin  and azithromycin .  Initially also in A-fib with RVR, new onset therefore started on p.o. metoprolol .   Assessment & Plan:  Principal Problem:   CAP (community acquired pneumonia) Active Problems:   Acute hypoxic respiratory failure (HCC)   Sepsis (HCC)   A-fib (HCC)   AKI (acute kidney injury) (HCC)   Transaminitis   Hyperglycemia     Community-acquired pneumonia Acute hypoxic respiratory failure, secondary to above, on 8L  Sepsis, secondary to above, present on admission  Pleural effusion Failed outpatient treatment with Rocephin /Augmentin .  Admitted to the hospital.  CTA shows near right lower lobe complete consolidation and his hypoxic. Now with worsening O2 requirement.  Thoracentesis attempted but trace fluid therefore no procedure performed. - Continue IV Rocephin  and azithromycin  - Procalcitonin and BNP elevated.  Continue bronchodilators/I-S/flutter valve/Mucinex  - Renal function improved, give Lasix   Atrial fibrillation, new onset, initially RVR now rate controlled New onset, no prior history.  TSH ordered.  Now back in normal sinus rhythm, echocardiogram shows preserved EF.  Patient hesitant to start anticoagulation at this time, he would like to discuss with outpatient PCP.  He is agreeable  to get ambulatory monitor upon discharge. - Continue metoprolol  twice daily   Elevated BNP -Echo  shows preserved ef, received lasix .    Acute kidney injury stage I Baseline creatinine 0.8, elevated 1.4 on admission.  Initially received fluids but then required Lasix  due to signs of volume overload   Acute liver injury, mixed pattern AST 110, alk phos 202, other LFT normal.  May be related to underlying systemic illness. - Trend LFT  Essential hypertension - Benicar  on hold due to mild AKI, IV as needed   History of prediabetes Hyperglycemia Chronic steroid use Hyperlipidemia Home regimen metformin  Accu-Cheks and sliding scale - A1c 6.6   Hyponatremia, mild, resolved Resolved and stable  Polymyalgia rheumatica - On prednisone  5 mg daily   Incidental findings: 8 mm nodule in the lingula: Outpatient surveillance imaging    DVT prophylaxis: enoxaparin  (LOVENOX ) injection 40 mg Start: 04/20/24 1000    Code Status: Full Code Family Communication: Family at bedside During hospital stay for management of hypoxia   Subjective: Yesterday evening got significantly short of breath requiring Lasix .  Feeling much better this morning   Examination:  General exam: Appears calm and comfortable  Respiratory system: Some rhonchi especially on the right side Cardiovascular system: S1 & S2 heard, RRR. No JVD, murmurs, rubs, gallops or clicks. No pedal edema. Gastrointestinal system: Abdomen is nondistended, soft and nontender. No organomegaly or masses felt. Normal bowel sounds heard. Central nervous system: Alert and oriented. No focal neurological deficits. Extremities: Symmetric 5 x 5 power. Skin: No rashes, lesions or ulcers Psychiatry: Judgement and insight appear normal. Mood & affect appropriate.  Diet Orders (From admission, onward)     Start     Ordered   04/19/24 2230  Diet Carb Modified Fluid consistency: Thin; Room service appropriate? Yes   Diet effective now       Question Answer Comment  Diet-HS Snack? Nothing   Calorie Level Medium 1600-2000   Fluid consistency: Thin   Room service appropriate? Yes      04/19/24 2233            Objective: Vitals:   04/21/24 0815 04/21/24 0926 04/21/24 1007 04/21/24 1102  BP:    (!) 157/82  Pulse:    99  Resp:    15  Temp:    98.2 F (36.8 C)  TempSrc:    Oral  SpO2: 97% 96% 93% 94%  Weight:      Height:        Intake/Output Summary (Last 24 hours) at 04/21/2024 1110 Last data filed at 04/21/2024 0900 Gross per 24 hour  Intake 1717.87 ml  Output 1400 ml  Net 317.87 ml   Filed Weights   04/19/24 2050  Weight: 77.9 kg    Scheduled Meds:  aspirin  EC  81 mg Oral Daily   atorvastatin   80 mg Oral QPM   azithromycin   500 mg Oral Daily   enoxaparin  (LOVENOX ) injection  40 mg Subcutaneous Q24H   guaiFENesin   1,200 mg Oral BID   insulin  aspart  0-6 Units Subcutaneous TID WC   ipratropium-albuterol   3 mL Nebulization BID   metoprolol  tartrate  25 mg Oral BID   predniSONE   5 mg Oral Q breakfast   sodium chloride  flush  3 mL Intravenous Q12H   Continuous Infusions:  cefTRIAXone  (ROCEPHIN )  IV 200 mL/hr at 04/20/24 1539    Nutritional status     Body mass index is 23.29 kg/m.  Data Reviewed:   CBC: Recent Labs  Lab 04/19/24 1530 04/19/24 1536 04/20/24 0205 04/21/24 0336  WBC 18.6*  --  15.9* 21.1*  HGB 14.6 15.6 13.3 13.4  HCT 43.0 46.0 39.5 39.9  MCV 86.5  --  86.4 88.1  PLT 311  --  296 244   Basic Metabolic Panel: Recent Labs  Lab 04/19/24 1530 04/19/24 1536 04/20/24 0205 04/21/24 0853  NA 129* 132* 135 134*  K 4.4 4.5 4.0 4.0  CL 94*  --  100 100  CO2 21*  --  21* 21*  GLUCOSE 302*  --  195* 120*  BUN 23  --  22 29*  CREATININE 1.41*  --  1.42* 1.16  CALCIUM  8.8*  --  8.3* 8.3*  MG  --   --  2.2 2.5*  PHOS  --   --  3.6 3.6   GFR: Estimated Creatinine Clearance: 57.6 mL/min (by C-G formula based on SCr of 1.16 mg/dL). Liver Function  Tests: Recent Labs  Lab 04/19/24 1530 04/20/24 0446 04/21/24 0853  AST 110* 86* 89*  ALT 41 37 37  ALKPHOS 202* 149* 148*  BILITOT 0.7 0.7 0.9  PROT 7.1 6.5 6.3*  ALBUMIN  3.0* 2.1* 1.9*   No results for input(s): LIPASE, AMYLASE in the last 168 hours. No results for input(s): AMMONIA in the last 168 hours. Coagulation Profile: No results for input(s): INR, PROTIME in the last 168 hours. Cardiac Enzymes: Recent Labs  Lab 04/19/24 1530  CKTOTAL 189   BNP (last 3 results) No results for input(s): PROBNP in the last 8760 hours. HbA1C: Recent Labs    04/20/24 0205  HGBA1C 6.6*  CBG: Recent Labs  Lab 04/20/24 1052 04/20/24 1652 04/20/24 2116 04/21/24 0615 04/21/24 1105  GLUCAP 154* 104* 169* 89 125*   Lipid Profile: No results for input(s): CHOL, HDL, LDLCALC, TRIG, CHOLHDL, LDLDIRECT in the last 72 hours. Thyroid  Function Tests: Recent Labs    04/20/24 0500  TSH 0.742   Anemia Panel: No results for input(s): VITAMINB12, FOLATE, FERRITIN, TIBC, IRON, RETICCTPCT in the last 72 hours. Sepsis Labs: Recent Labs  Lab 04/20/24 0205 04/20/24 0905  PROCALCITON 6.79  --   LATICACIDVEN  --  3.1*    Recent Results (from the past 240 hours)  Urine Culture     Status: None   Collection Time: 04/18/24  7:24 AM   Specimen: Urine  Result Value Ref Range Status   MICRO NUMBER: [id number]  Final   SPECIMEN QUALITY: Adequate  Final   Sample Source URINE  Final   STATUS: FINAL  Final   Result: No Growth  Final  Resp panel by RT-PCR (RSV, Flu A&B, Covid) Anterior Nasal Swab     Status: None   Collection Time: 04/19/24  2:46 PM   Specimen: Anterior Nasal Swab  Result Value Ref Range Status   SARS Coronavirus 2 by RT PCR NEGATIVE NEGATIVE Final    Comment: (NOTE) SARS-CoV-2 target nucleic acids are NOT DETECTED.  The SARS-CoV-2 RNA is generally detectable in upper respiratory specimens during the acute phase of infection. The  lowest concentration of SARS-CoV-2 viral copies this assay can detect is 138 copies/mL. A negative result does not preclude SARS-Cov-2 infection and should not be used as the sole basis for treatment or other patient management decisions. A negative result may occur with  improper specimen collection/handling, submission of specimen other than nasopharyngeal swab, presence of viral mutation(s) within the areas targeted by this assay, and inadequate number of viral copies(<138 copies/mL). A negative result must be combined with clinical observations, patient history, and epidemiological information. The expected result is Negative.  Fact Sheet for Patients:  BloggerCourse.com  Fact Sheet for Healthcare Providers:  SeriousBroker.it  This test is no t yet approved or cleared by the [place]  FDA and  has been authorized for detection and/or diagnosis of SARS-CoV-2 by FDA under an Emergency Use Authorization (EUA). This EUA will remain  in effect (meaning this test can be used) for the duration of the COVID-19 declaration under Section 564(b)(1) of the Act, 21 U.S.C.section 360bbb-3(b)(1), unless the authorization is terminated  or revoked sooner.       Influenza A by PCR NEGATIVE NEGATIVE Final   Influenza B by PCR NEGATIVE NEGATIVE Final    Comment: (NOTE) The Xpert Xpress SARS-CoV-2/FLU/RSV plus assay is intended as an aid in the diagnosis of influenza from Nasopharyngeal swab specimens and should not be used as a sole basis for treatment. Nasal washings and aspirates are unacceptable for Xpert Xpress SARS-CoV-2/FLU/RSV testing.  Fact Sheet for Patients: BloggerCourse.com  Fact Sheet for Healthcare Providers: SeriousBroker.it  This test is not yet approved or cleared by the [place]  FDA and has been authorized for detection and/or diagnosis of SARS-CoV-2 by FDA under  an Emergency Use Authorization (EUA). This EUA will remain in effect (meaning this test can be used) for the duration of the COVID-19 declaration under Section 564(b)(1) of the Act, 21 U.S.C. section 360bbb-3(b)(1), unless the authorization is terminated or revoked.     Resp Syncytial Virus by PCR NEGATIVE NEGATIVE Final    Comment: (NOTE) Fact Sheet for Patients: BloggerCourse.com  Fact Sheet  for Healthcare Providers: SeriousBroker.it  This test is not yet approved or cleared by the [place]  FDA and has been authorized for detection and/or diagnosis of SARS-CoV-2 by FDA under an Emergency Use Authorization (EUA). This EUA will remain in effect (meaning this test can be used) for the duration of the COVID-19 declaration under Section 564(b)(1) of the Act, 21 U.S.C. section 360bbb-3(b)(1), unless the authorization is terminated or revoked.  Performed at [facility], [address] [place], [place] [address]   MRSA Next Gen by PCR, Nasal     Status: None   Collection Time: 04/19/24  8:57 PM   Specimen: Nasal Mucosa; Nasal Swab  Result Value Ref Range Status   MRSA by PCR Next Gen NOT DETECTED NOT DETECTED Final    Comment: (NOTE) The GeneXpert MRSA Assay (FDA approved for NASAL specimens only), is one component of a comprehensive MRSA colonization surveillance program. It is not intended to diagnose MRSA infection nor to guide or monitor treatment for MRSA infections. Test performance is not FDA approved in patients less than 33 years old. Performed at [facility], [address] [place], [place] [address]   Culture, blood (Routine X 2) w Reflex to ID Panel     Status: None (Preliminary result)   Collection Time: 04/20/24  9:05 AM   Specimen: BLOOD  Result Value Ref Range Status   Specimen Description BLOOD SITE NOT SPECIFIED  Final   Special Requests   Final    BOTTLES DRAWN AEROBIC ONLY Blood  Culture results may not be optimal due to an inadequate volume of blood received in culture bottles   Culture   Final    NO GROWTH < 24 HOURS Performed at [facility], [address] [place], [place] [address]    Report Status PENDING  Incomplete  Culture, blood (Routine X 2) w Reflex to ID Panel     Status: None (Preliminary result)   Collection Time: 04/20/24  1:37 PM   Specimen: BLOOD RIGHT ARM  Result Value Ref Range Status   Specimen Description BLOOD RIGHT ARM  Final   Special Requests   Final    BOTTLES DRAWN AEROBIC AND ANAEROBIC Blood Culture results may not be optimal due to an inadequate volume of blood received in culture bottles   Culture   Final    NO GROWTH < 24 HOURS Performed at [facility], [address], [place], [place] [address]    Report Status PENDING  Incomplete  Expectorated Sputum Assessment w Gram Stain, Rflx to Resp Cult     Status: None   Collection Time: 04/20/24  8:37 PM   Specimen: Expectorated Sputum  Result Value Ref Range Status   Specimen Description EXPECTORATED SPUTUM  Final   Special Requests NONE  Final   Sputum evaluation   Final    Sputum specimen not acceptable for testing.  Please recollect.   Gram Stain Report Called to,Read Back By and Verified With: RN RONAL RADFORD [phone] @2325  FH Performed at [facility], [address] [place], [place] [address]    Report Status 04/20/2024 FINAL  Final  Expectorated Sputum Assessment w Gram Stain, Rflx to Resp Cult     Status: None   Collection Time: 04/21/24  4:00 AM   Specimen: Expectorated Sputum  Result Value Ref Range Status   Specimen Description EXPECTORATED SPUTUM  Final   Special Requests NONE  Final   Sputum evaluation  Final    THIS SPECIMEN IS ACCEPTABLE FOR SPUTUM CULTURE Performed at [facility], [address], [place], [place] [address]    Report Status 04/21/2024 FINAL  Final  Culture, Respiratory w Gram Stain     Status: None (Preliminary result)    Collection Time: 04/21/24  4:00 AM  Result Value Ref Range Status   Specimen Description EXPECTORATED SPUTUM  Final   Special Requests NONE Reflexed from [id number]  Final   Gram Stain   Final    FEW WBC PRESENT, PREDOMINANTLY PMN FEW BUDDING YEAST SEEN Performed at [facility], [address], [place], [place] [address]    Culture PENDING  Incomplete   Report Status PENDING  Incomplete         Radiology Studies: DG Chest Port 1 View Result Date: 04/20/2024 CLINICAL DATA:  [id number] Dyspnea [id number] EXAM: PORTABLE CHEST 1 VIEW COMPARISON:  Chest x-ray 04/18/2024, CT chest 04/19/2024 FINDINGS: The heart and mediastinal contours are unchanged. Atherosclerotic plaque Interval worsening of patchy airspace and interstitial opacities of the right lung with almost complete opacification of the mid lower lung zones. No pulmonary edema. Persistent right pleural effusion. Possible trace left pleural effusion. No pneumothorax. No acute osseous abnormality. Left shoulder surgical anchor sutures. IMPRESSION: 1. Interval worsening of patchy airspace and interstitial opacities of the right lung with almost complete opacification of the mid lower lung zones. 2. Persistent right pleural effusion. 3. Possible trace left pleural effusion. 4.  Aortic Atherosclerosis (ICD10-I70.0). Electronically Signed   By: Morgane  Naveau M.D.   On: 04/20/2024 17:21   ECHOCARDIOGRAM COMPLETE Result Date: 04/20/2024    ECHOCARDIOGRAM REPORT   Patient Name:   Brandon Dixon Date of Exam: 04/20/2024 Medical Rec #:  [id number]      Height:       72.0 in Accession #:    [id number]     Weight:       171.7 lb Date of Birth:  Jan 10, 1946      BSA:          1.997 m Patient Age:    30 years       BP:           131/72 mmHg Patient Gender: M              HR:           105 bpm. Exam Location:  Inpatient Procedure: 2D Echo, Cardiac Doppler, Color Doppler and Intracardiac            Opacification Agent (Both Spectral and Color Flow Doppler were             utilized during procedure). Indications:    Atrial Fibrillation  History:        Patient has no prior history of Echocardiogram examinations.                 Risk Factors:Hypertension and Dyslipidemia.  Sonographer:    Therisa Crouch Referring Phys: [id number] JONATHAN SEGARS IMPRESSIONS  1. Left ventricular ejection fraction, by estimation, is 55 to 60%. The left ventricle has normal function. The left ventricle has no regional wall motion abnormalities. Left ventricular diastolic parameters were normal.  2. Right ventricular systolic function is normal. The right ventricular size is normal. There is moderately elevated pulmonary artery systolic pressure.  3. Left atrial size was mildly dilated.  4. The mitral valve is grossly normal. Trivial mitral valve regurgitation. No evidence of mitral stenosis.  5. The aortic  valve is grossly normal. Aortic valve regurgitation is not visualized. No aortic stenosis is present.  6. The inferior vena cava is normal in size with greater than 50% respiratory variability, suggesting right atrial pressure of 3 mmHg. Comparison(s): No prior Echocardiogram. Conclusion(s)/Recommendation(s): Normal biventricular function without evidence of hemodynamically significant valvular heart disease. FINDINGS  Left Ventricle: Left ventricular ejection fraction, by estimation, is 55 to 60%. The left ventricle has normal function. The left ventricle has no regional wall motion abnormalities. Definity  contrast agent was given IV to delineate the left ventricular  endocardial borders. The left ventricular internal cavity size was normal in size. There is no left ventricular hypertrophy. Left ventricular diastolic parameters were normal. Right Ventricle: The right ventricular size is normal. No increase in right ventricular wall thickness. Right ventricular systolic function is normal. There is moderately elevated pulmonary artery systolic pressure. The tricuspid regurgitant velocity is 3.54 m/s,  and with an assumed right atrial pressure of 3 mmHg, the estimated right ventricular systolic pressure is 53.1 mmHg. Left Atrium: Left atrial size was mildly dilated. Right Atrium: Right atrial size was normal in size. Pericardium: There is no evidence of pericardial effusion. Mitral Valve: The mitral valve is grossly normal. Trivial mitral valve regurgitation. No evidence of mitral valve stenosis. Tricuspid Valve: The tricuspid valve is normal in structure. Tricuspid valve regurgitation is trivial. No evidence of tricuspid stenosis. Aortic Valve: The aortic valve is grossly normal. Aortic valve regurgitation is not visualized. No aortic stenosis is present. Pulmonic Valve: The pulmonic valve was not well visualized. Pulmonic valve regurgitation is not visualized. No evidence of pulmonic stenosis. Aorta: The aortic root, ascending aorta, aortic arch and descending aorta are all structurally normal, with no evidence of dilitation or obstruction and the aortic root and ascending aorta are structurally normal, with no evidence of dilitation. Venous: The inferior vena cava is normal in size with greater than 50% respiratory variability, suggesting right atrial pressure of 3 mmHg. IAS/Shunts: The atrial septum is grossly normal.  LEFT VENTRICLE PLAX 2D LVIDd:         4.10 cm Diastology LVIDs:         2.90 cm LV e' medial:    8.49 cm/s LV PW:         0.80 cm LV E/e' medial:  9.6 LV IVS:        0.90 cm LV e' lateral:   15.10 cm/s                        LV E/e' lateral: 5.4  RIGHT VENTRICLE             IVC RV Basal diam:  3.90 cm     IVC diam: 1.70 cm RV S prime:     15.30 cm/s TAPSE (M-mode): 2.3 cm LEFT ATRIUM             Index        RIGHT ATRIUM           Index LA diam:        3.10 cm 1.55 cm/m   RA Area:     17.20 cm LA Vol (A2C):   50.9 ml 25.49 ml/m  RA Volume:   49.50 ml  24.79 ml/m LA Vol (A4C):   62.2 ml 31.15 ml/m LA Biplane Vol: 59.9 ml 30.00 ml/m   AORTA Ao Root diam: 3.40 cm Ao Asc diam:  2.80 cm MITRAL  VALVE  TRICUSPID VALVE MV Area (PHT): 4.86 cm    TR Peak grad:   50.1 mmHg MV Decel Time: 156 msec    TR Vmax:        354.00 cm/s MV E velocity: 81.40 cm/s MV A velocity: 85.30 cm/s MV E/A ratio:  0.95 Shelda Bruckner MD Electronically signed by Shelda Bruckner MD Signature Date/Time: 04/20/2024/4:10:29 PM    Final    CT Angio Chest PE W/Cm &/Or Wo Cm Result Date: 04/19/2024 CLINICAL DATA:  Concern for pulmonary embolism. EXAM: CT ANGIOGRAPHY CHEST WITH CONTRAST TECHNIQUE: Multidetector CT imaging of the chest was performed using the standard protocol during bolus administration of intravenous contrast. Multiplanar CT image reconstructions and MIPs were obtained to evaluate the vascular anatomy. RADIATION DOSE REDUCTION: This exam was performed according to the departmental dose-optimization program which includes automated exposure control, adjustment of the mA and/or kV according to patient size and/or use of iterative reconstruction technique. CONTRAST:  75mL OMNIPAQUE  IOHEXOL  350 MG/ML SOLN COMPARISON:  Chest radiograph dated 04/18/2024. FINDINGS: Cardiovascular: There is no cardiomegaly or pericardial effusion. The thoracic aorta is unremarkable. The origins of the great vessels of the aortic arch appear patent. No pulmonary artery embolus identified. Mediastinum/Nodes: No obvious adenopathy. Evaluation however is limited due to consolidative changes of the right lung. The esophagus is grossly unremarkable. No mediastinal fluid collection. Lungs/Pleura: Large area of consolidation involving the right lung with near complete consolidation of the right lower lobe most consistent with pneumonia. Underlying mass is not excluded clinical correlation and follow-up after treatment recommended to document resolution. There is an 8 mm nodule in the lingula. Attention on follow-up imaging recommended. There is a small right pleural effusion. No pneumothorax. The central airways are patent. Upper  Abdomen: No acute abnormality. Musculoskeletal: Degenerative changes of the spine. No acute osseous pathology. Review of the MIP images confirms the above findings. IMPRESSION: 1. No CT evidence of pulmonary artery embolus. 2. Large area of consolidation involving the right lung most consistent with pneumonia. Clinical correlation and follow-up after treatment recommended to document resolution. 3. Small right pleural effusion. 4. An 8 mm nodule in the lingula. Attention on follow-up imaging recommended. Electronically Signed   By: Vanetta Chou M.D.   On: 04/19/2024 18:14           LOS: 2 days   Time spent= 35 mins    Burgess JAYSON Dare, MD [facility]  If 7PM-7AM, please contact night-coverage  04/21/2024, 11:10 AM

## 2024-04-21 NOTE — Progress Notes (Signed)
 Interventional Radiology Brief Note:  Limited US  Chest shows only a trace amount of right pleural fluid insufficient for thoracentesis.  No procedure performed.   Rhiannan Kievit, MS RD PA-C

## 2024-04-22 ENCOUNTER — Inpatient Hospital Stay (HOSPITAL_COMMUNITY)

## 2024-04-22 DIAGNOSIS — J189 Pneumonia, unspecified organism: Secondary | ICD-10-CM | POA: Diagnosis not present

## 2024-04-22 LAB — RESPIRATORY PANEL BY PCR

## 2024-04-22 LAB — MAGNESIUM: Magnesium: 2.3 mg/dL (ref 1.7–2.4)

## 2024-04-22 LAB — CBC
HCT: 39 % (ref 39.0–52.0)
Hemoglobin: 13.5 g/dL (ref 13.0–17.0)
MCH: 29.4 pg (ref 26.0–34.0)
MCHC: 34.6 g/dL (ref 30.0–36.0)
MCV: 85 fL (ref 80.0–100.0)
Platelets: 273 K/uL (ref 150–400)
RBC: 4.59 MIL/uL (ref 4.22–5.81)
RDW: 15.4 % (ref 11.5–15.5)
WBC: 22.7 K/uL — ABNORMAL HIGH (ref 4.0–10.5)
nRBC: 0 % (ref 0.0–0.2)

## 2024-04-22 LAB — COMPREHENSIVE METABOLIC PANEL WITH GFR
ALT: 33 U/L (ref 0–44)
AST: 77 U/L — ABNORMAL HIGH (ref 15–41)
Albumin: 1.8 g/dL — ABNORMAL LOW (ref 3.5–5.0)
Alkaline Phosphatase: 134 U/L — ABNORMAL HIGH (ref 38–126)
Anion gap: 13 (ref 5–15)
BUN: 28 mg/dL — ABNORMAL HIGH (ref 8–23)
CO2: 23 mmol/L (ref 22–32)
Calcium: 8.2 mg/dL — ABNORMAL LOW (ref 8.9–10.3)
Chloride: 98 mmol/L (ref 98–111)
Creatinine, Ser: 1.23 mg/dL (ref 0.61–1.24)
GFR, Estimated: >60 mL/min (ref >60)
Glucose, Bld: 115 mg/dL — ABNORMAL HIGH (ref 70–99)
Potassium: 3.7 mmol/L (ref 3.5–5.1)
Sodium: 134 mmol/L — ABNORMAL LOW (ref 135–145)
Total Bilirubin: 0.5 mg/dL (ref 0.0–1.2)
Total Protein: 6.2 g/dL — ABNORMAL LOW (ref 6.5–8.1)

## 2024-04-22 LAB — GLUCOSE, CAPILLARY
Glucose-Capillary: 114 mg/dL — ABNORMAL HIGH (ref 70–99)
Glucose-Capillary: 197 mg/dL — ABNORMAL HIGH (ref 70–99)
Glucose-Capillary: 152 mg/dL — ABNORMAL HIGH (ref 70–99)
Glucose-Capillary: 101 mg/dL — ABNORMAL HIGH (ref 70–99)

## 2024-04-22 LAB — BRAIN NATRIURETIC PEPTIDE: B Natriuretic Peptide: 193.6 pg/mL — ABNORMAL HIGH (ref 0.0–100.0)

## 2024-04-22 LAB — PROCALCITONIN: Procalcitonin: 4.89 ng/mL

## 2024-04-22 MED ORDER — PIPERACILLIN-TAZOBACTAM 3.375 G IVPB
3.3750 g | Freq: Three times a day (TID) | INTRAVENOUS | Status: DC
  Administered 2024-04-22 – 2024-04-28 (×19): 3.375 g via INTRAVENOUS
  Filled 2024-04-22 (×20): qty 50

## 2024-04-22 MED ORDER — IOHEXOL 350 MG/ML SOLN
75.0000 mL | Freq: Once | INTRAVENOUS | Status: AC | PRN
  Administered 2024-04-22: 75 mL via INTRAVENOUS

## 2024-04-22 NOTE — Plan of Care (Signed)

## 2024-04-22 NOTE — Plan of Care (Signed)

## 2024-04-22 NOTE — Evaluation (Signed)
 Physical Therapy Evaluation Patient Details Name: Brandon Dixon MRN: [id number] DOB: Dec 28, 1945 Today's Date: 04/22/2024  History of Present Illness  Pt is a 78 year old man admitted with CAP, sepsis and respiratory failure with hypoxia on 04/19/24. Hospital course complicated by new onset afib with RVR. PMH: PAD, PMR on chronic prednisone , HTN, HLD, prediabetes, BPH, GERD, B rotator cuff repairs.   Clinical Impression  Pt in bed upon arrival of PT, agreeable to evaluation at this time. Prior to admission the pt was completely independent with all mobility, active, and retired. The pt lives with his spouse in a farm house with 4 steps to enter and is typically able to ambulate long distances on uneven terrain without issue. The pt presents today with significant fatigue, SpO2 87-89% at rest on RA. The pt was agreeable to limited session, able to rise to standing without UE support or DME, and manage short bout of ambulation in the room with minA-CGA but without UE support. He does demo increased sway and intermittent posterior lean in standing, and max HR of 125bpm. The pt reports limited to ~25 ft by fatigue, will benefit from skilled PT acutely to progress activity tolerance, stair training, and stability prior to anticipated return home. Pt and family in agreement with plan.         If plan is discharge home, recommend the following: A little help with walking and/or transfers;A little help with bathing/dressing/bathroom;Assistance with cooking/housework;Direct supervision/assist for medications management;Direct supervision/assist for financial management;Assist for transportation;Help with stairs or ramp for entrance   Can travel by private vehicle        Equipment Recommendations Rolling walker (2 wheels) (shower chair)  Recommendations for Other Services       Functional Status Assessment Patient has had a recent decline in their functional status and demonstrates the ability to make  significant improvements in function in a reasonable and predictable amount of time.     Precautions / Restrictions Precautions Precautions: Fall Recall of Precautions/Restrictions: Intact Restrictions Weight Bearing Restrictions Per Provider Order: No      Mobility  Bed Mobility Overal bed mobility: Modified Independent             General bed mobility comments: line management, pt otherwise able to complete without assistance    Transfers Overall transfer level: Needs assistance Equipment used: None Transfers: Sit to/from Stand Sit to Stand: Contact guard assist           General transfer comment: increased sway, CGA to minA initially.    Ambulation/Gait Ambulation/Gait assistance: Contact guard assist, Min assist Gait Distance (Feet): 25 Feet Assistive device: None Gait Pattern/deviations: Step-through pattern, Decreased stride length Gait velocity: decreased Gait velocity interpretation: <1.31 ft/sec, indicative of household ambulator   General Gait Details: pt with increased sway but no overt LOB. minA at times for support. HR max 125bpm, SpO2 low 87% on RA.     Balance Overall balance assessment: Needs assistance Sitting-balance support: Bilateral upper extremity supported, Feet supported Sitting balance-Leahy Scale: Fair Sitting balance - Comments: suspect due to fatigue, pt maintained UE support Postural control: Posterior lean Standing balance support: No upper extremity supported, During functional activity Standing balance-Leahy Scale: Fair Standing balance comment: posterior lean and sway when standing EOB                             Pertinent Vitals/Pain Pain Assessment Pain Assessment: No/denies pain    Home Living Family/patient  expects to be discharged to:: Private residence Living Arrangements: Spouse/significant other Available Help at Discharge: Family;Available 24 hours/day Type of Home: House Home Access: Stairs to  enter Entrance Stairs-Rails: None Entrance Stairs-Number of Steps: 4   Home Layout: One level Home Equipment: None      Prior Function Prior Level of Function : Independent/Modified Independent;Driving             Mobility Comments: active, plays pickleball, 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 ADLs Comments: independent, retired     Extremity/Trunk Assessment   Upper Extremity Assessment Upper Extremity Assessment: Defer to OT evaluation;Overall WFL for tasks assessed    Lower Extremity Assessment Lower Extremity Assessment: Overall WFL for tasks assessed (grossly 4+/5 to MMT, reports no changes in sensation)    Cervical / Trunk Assessment Cervical / Trunk Assessment: Normal  Communication   Communication Communication: No apparent difficulties Factors Affecting Communication: Other (comment) (reports hearing loss in higher ranges)    Cognition Arousal: Alert Behavior During Therapy: WFL for tasks assessed/performed   PT - Cognitive impairments: No apparent impairments                       PT - Cognition Comments: flat affect, suspect due to fatigue. pt answering questions appropriately and following commands well. not formally assessed Following commands: Intact       Cueing Cueing Techniques: Verbal cues     General Comments General comments (skin integrity, edema, etc.): SpO2 87-89% on RA at rest and with mobility. BP elevated but stable, HR 110-120    Exercises     Assessment/Plan    PT Assessment Patient needs continued PT services  PT Problem List Decreased strength;Decreased activity tolerance;Decreased balance;Decreased mobility       PT Treatment Interventions DME instruction;Gait training;Stair training;Functional mobility training;Therapeutic activities;Therapeutic exercise;Balance training;Patient/family education    PT Goals (Current goals can be found in the Care Plan section)  Acute Rehab PT Goals Patient Stated Goal: return to full  independence PT Goal Formulation: With patient/family Time For Goal Achievement: 05/06/24 Potential to Achieve Goals: Good    Frequency Min 2X/week        AM-PAC PT 6 Clicks Mobility  Outcome Measure Help needed turning from your back to your side while in a flat bed without using bedrails?: None Help needed moving from lying on your back to sitting on the side of a flat bed without using bedrails?: None Help needed moving to and from a bed to a chair (including a wheelchair)?: A Little Help needed standing up from a chair using your arms (e.g., wheelchair or bedside chair)?: A Little Help needed to walk in hospital room?: A Little Help needed climbing 3-5 steps with a railing? : A Lot 6 Click Score: 19    End of Session Equipment Utilized During Treatment: Gait belt Activity Tolerance: Patient limited by fatigue Patient left: in bed;with call bell/phone within reach;with bed alarm set;with family/visitor present Nurse Communication: Mobility status (vitals) PT Visit Diagnosis: Unsteadiness on feet (R26.81);Muscle weakness (generalized) (M62.81)    Time: 8378-8348 PT Time Calculation (min) (ACUTE ONLY): 30 min   Charges:   PT Evaluation $PT Eval Moderate Complexity: 1 Mod PT Treatments $Therapeutic Activity: 8-22 mins PT General Charges $$ ACUTE PT VISIT: 1 Visit         Izetta Call, PT, DPT   Acute Rehabilitation Department Office [phone] Secure Chat Communication Preferred  Izetta JULIANNA Call 04/22/2024, 6:00 PM

## 2024-04-22 NOTE — Evaluation (Signed)
 Occupational Therapy Evaluation Patient Details Name: Brandon Dixon MRN: [id number] DOB: [birth year] Today's Date: 04/22/2024   History of Present Illness   Pt is a 78 year old man admitted with CAP, sepsis and respiratory failure with hypoxia on 04/19/24. Hospital course complicated by new onset afib with RVR. PMH: PAD, PMR on chronic prednisone , HTN, HLD, prediabetes, BPH, GERD, B rotator cuff repairs.     Clinical Impressions Pt was independent and active prior to admission. Lives with his supportive wife. Pt presents with generalized weakness and decreased activity tolerance. Wife reports pt with confusion at home leading to her taking him back to the doctor which has improved significantly. Pt requires set up to CGA for ADLs and CGA for OOB without AD. VSS on RA throughout session. He has been routinely walking to the bathroom with nursing staff. Pt likely to progress well. Do not anticipate he will require post acute OT. Wife will supervise tub transfers, recommended seated showering initially.      If plan is discharge home, recommend the following:   A little help with walking and/or transfers;A little help with bathing/dressing/bathroom;Assistance with cooking/housework;Assist for transportation;Help with stairs or ramp for entrance     Functional Status Assessment   Patient has had a recent decline in their functional status and demonstrates the ability to make significant improvements in function in a reasonable and predictable amount of time.     Equipment Recommendations   None recommended by OT     Recommendations for Other Services         Precautions/Restrictions   Precautions Precautions: Fall Recall of Precautions/Restrictions: Intact Restrictions Weight Bearing Restrictions Per Provider Order: No     Mobility Bed Mobility Overal bed mobility: Modified Independent                  Transfers Overall transfer level: Needs  assistance Equipment used: None Transfers: Sit to/from Stand Sit to Stand: Contact guard assist           General transfer comment: slow to rise, tentative initially      Balance Overall balance assessment: Needs assistance   Sitting balance-Leahy Scale: Normal       Standing balance-Leahy Scale: Fair                             ADL either performed or assessed with clinical judgement   ADL Overall ADL's : Needs assistance/impaired Eating/Feeding: Independent;Bed level   Grooming: Contact guard assist;Standing   Upper Body Bathing: Set up;Sitting   Lower Body Bathing: Contact guard assist;Sit to/from stand   Upper Body Dressing : Set up;Sitting   Lower Body Dressing: Set up;Sitting/lateral leans   Toilet Transfer: Contact guard assist           Functional mobility during ADLs: Contact guard assist       Vision Ability to See in Adequate Light: 0 Adequate Patient Visual Report: No change from baseline       Perception         Praxis         Pertinent Vitals/Pain Pain Assessment Pain Assessment: No/denies pain     Extremity/Trunk Assessment Upper Extremity Assessment Upper Extremity Assessment: Overall WFL for tasks assessed   Lower Extremity Assessment Lower Extremity Assessment: Defer to PT evaluation   Cervical / Trunk Assessment Cervical / Trunk Assessment: Normal   Communication Communication Communication: No apparent difficulties Factors Affecting Communication: Other (comment) (reports hearing loss  in higher ranges)   Cognition Arousal: Alert Behavior During Therapy: WFL for tasks assessed/performed Cognition: No apparent impairments                               Following commands: Intact       Cueing  General Comments   Cueing Techniques: Verbal cues      Exercises     Shoulder Instructions      Home Living Family/patient expects to be discharged to:: Private residence Living  Arrangements: Spouse/significant other Available Help at Discharge: Family;Available 24 hours/day Type of Home: House Home Access: Stairs to enter [employer] of Steps: 4 Entrance Stairs-Rails: None Home Layout: One level     Bathroom Shower/Tub: Chief Strategy Officer: Standard     Home Equipment: None          Prior Functioning/Environment Prior Level of Function : Independent/Modified Independent;Driving             Mobility Comments: active, plays pickleball, 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 Additional ADL Goal #2: Pt will generalize energy conservation strategies in ADLs and mobility.   OT Frequency:  Min 2X/week    Co-evaluation              AM-PAC OT 6 Clicks Daily Activity     Outcome Measure Help from another person eating meals?: None Help from another person taking care of personal grooming?: A Little Help from another person toileting, which includes using toliet, bedpan, or urinal?: A Little Help from another person bathing (including washing, rinsing, drying)?: A Little Help from another person to put on and taking off regular upper body clothing?: None Help from another person to put on and taking off regular lower body clothing?: A Little 6 Click Score: 20   End of Session Equipment Utilized During Treatment: Gait belt  Activity Tolerance: Patient tolerated treatment well Patient left: in bed;with call bell/phone within reach;with family/visitor present  OT Visit Diagnosis: Unsteadiness on feet (R26.81);Other  (comment) (decreased activity tolerance)                Time: 9056-8980 OT Time Calculation (min): 36 min Charges:  OT General Charges $OT Visit: 1 Visit OT Evaluation $OT Eval Low Complexity: 1 Low OT Treatments $Self Care/Home Management : 8-22 mins  [name], OTR/L Acute Rehabilitation Services Office: [phone]   [name] 04/22/2024, 10:27 AM

## 2024-04-22 NOTE — Progress Notes (Signed)
 PROGRESS NOTE    Brandon Dixon  FMW:[id number] DOB: 03-02-1946 DOA: 04/19/2024 PCP: Merna Huxley, NP    Brief Narrative:  78 y.o. male with hx of PAD, PMR on chronic prednisone , hypertension, hyperlipidemia, prediabetes, BPH, GERD, who is transferred from [facility] ED for community-acquired pneumonia with acute hypoxic respiratory failure.  Ongoing for almost 2 weeks, seen by outpatient PCP on 6/30 and was given IM Rocephin  followed by 10 days of Augmentin .  Augmentin  caused GI upset but went to go see PCP again were noted to be hypoxic therefore sent to the ER.  In the ER noted to have right lower lobe near complete consolidation on the CTA chest.  Started on IV Rocephin  and azithromycin .  Initially also in A-fib with RVR, new onset therefore started on p.o. metoprolol . Hospital course also complicated by fluid overload requiring IV Lasix .  Slowly he was weaned off oxygen down to room air.   Assessment & Plan:  Principal Problem:   CAP (community acquired pneumonia) Active Problems:   Acute hypoxic respiratory failure (HCC)   Sepsis (HCC)   A-fib (HCC)   AKI (acute kidney injury) (HCC)   Transaminitis   Hyperglycemia     Community-acquired pneumonia Acute hypoxic respiratory failure, secondary to above, on 8L  Sepsis, secondary to above, present on admission  Pleural effusion Failed outpatient treatment with Rocephin /Augmentin .   CTA shows near right lower lobe complete consolidation and his hypoxic. Now with worsening O2 requirement.  Thoracentesis attempted but trace fluid therefore no procedure performed. - Continue IV Rocephin  and azithromycin  - Procalcitonin and BNP elevated.  Continue bronchodilators/I-S/flutter valve/Mucinex  - Renal function improved, give Lasix   Due to persistent leukocytosis, will order CT abdomen pelvis with contrast  Atrial fibrillation, new onset, initially RVR now rate controlled New onset, no prior history.  TSH ordered.  Now back in  normal sinus rhythm, echocardiogram shows preserved EF.  Patient hesitant to start anticoagulation at this time, he would like to discuss with outpatient PCP.  He is agreeable to get ambulatory monitor upon discharge. - Continue metoprolol  twice daily   Elevated BNP -Echo  shows preserved ef, received lasix .    Acute kidney injury stage I Baseline creatinine 0.8, elevated 1.4 on admission.  Creatinine now stable around 1.2   Acute liver injury, mixed pattern AST 110, alk phos 202, other LFT normal.  May be related to underlying systemic illness. - Trend LFT  Essential hypertension - Benicar  on hold due to mild AKI, IV as needed   History of prediabetes Hyperglycemia Chronic steroid use Hyperlipidemia Home regimen metformin  Accu-Cheks and sliding scale - A1c 6.6   Hyponatremia, mild, resolved Resolved and stable  Polymyalgia rheumatica - On prednisone  5 mg daily   Incidental findings: 8 mm nodule in the lingula: Outpatient surveillance imaging   PT/OT  DVT prophylaxis: enoxaparin  (LOVENOX ) injection 40 mg Start: 04/20/24 1000    Code Status: Full Code Family Communication: Family at bedside    Subjective: Seen at bedside, feeling better.  Fever curve has improved   Examination:  General exam: Appears calm and comfortable  Respiratory system: Some rhonchi especially on the right side Cardiovascular system: S1 & S2 heard, RRR. No JVD, murmurs, rubs, gallops or clicks. No pedal edema. Gastrointestinal system: Abdomen is nondistended, soft and nontender. No organomegaly or masses felt. Normal bowel sounds heard. Central nervous system: Alert and oriented. No focal neurological deficits. Extremities: Symmetric 5 x 5 power. Skin: No rashes, lesions or ulcers Psychiatry: Judgement and  insight appear normal. Mood & affect appropriate.                Diet Orders (From admission, onward)     Start     Ordered   04/19/24 2230  Diet Carb Modified Fluid  consistency: Thin; Room service appropriate? Yes  Diet effective now       Question Answer Comment  Diet-HS Snack? Nothing   Calorie Level Medium 1600-2000   Fluid consistency: Thin   Room service appropriate? Yes      04/19/24 2233            Objective: Vitals:   04/21/24 2314 04/22/24 0617 04/22/24 0747 04/22/24 0933  BP: 130/69 (!) 153/89 128/74   Pulse: 86 90 91   Resp: 20 18 20    Temp: 99 F (37.2 C) 99.3 F (37.4 C) 98.1 F (36.7 C)   TempSrc: Oral Oral Oral   SpO2: 94% 96% 96% 95%  Weight:      Height:        Intake/Output Summary (Last 24 hours) at 04/22/2024 1029 Last data filed at 04/22/2024 0700 Gross per 24 hour  Intake 240 ml  Output 1200 ml  Net -960 ml   Filed Weights   04/19/24 2050  Weight: 77.9 kg    Scheduled Meds:  aspirin  EC  81 mg Oral Daily   atorvastatin   80 mg Oral QPM   enoxaparin  (LOVENOX ) injection  40 mg Subcutaneous Q24H   guaiFENesin   1,200 mg Oral BID   insulin  aspart  0-6 Units Subcutaneous TID WC   ipratropium-albuterol   3 mL Nebulization BID   metoprolol  tartrate  25 mg Oral BID   predniSONE   5 mg Oral Q breakfast   sodium chloride  flush  3 mL Intravenous Q12H   Continuous Infusions:  piperacillin -tazobactam (ZOSYN )  IV      Nutritional status     Body mass index is 23.29 kg/m.  Data Reviewed:   CBC: Recent Labs  Lab 04/19/24 1530 04/19/24 1536 04/20/24 0205 04/21/24 0336 04/22/24 0632  WBC 18.6*  --  15.9* 21.1* 22.7*  HGB 14.6 15.6 13.3 13.4 13.5  HCT 43.0 46.0 39.5 39.9 39.0  MCV 86.5  --  86.4 88.1 85.0  PLT 311  --  296 244 273   Basic Metabolic Panel: Recent Labs  Lab 04/19/24 1530 04/19/24 1536 04/20/24 0205 04/21/24 0853 04/22/24 0632  NA 129* 132* 135 134* 134*  K 4.4 4.5 4.0 4.0 3.7  CL 94*  --  100 100 98  CO2 21*  --  21* 21* 23  GLUCOSE 302*  --  195* 120* 115*  BUN 23  --  22 29* 28*  CREATININE 1.41*  --  1.42* 1.16 1.23  CALCIUM  8.8*  --  8.3* 8.3* 8.2*  MG  --   --  2.2 2.5*  2.3  PHOS  --   --  3.6 3.6  --    GFR: Estimated Creatinine Clearance: 54.3 mL/min (by C-G formula based on SCr of 1.23 mg/dL). Liver Function Tests: Recent Labs  Lab 04/19/24 1530 04/20/24 0446 04/21/24 0853 04/22/24 0632  AST 110* 86* 89* 77*  ALT 41 37 37 33  ALKPHOS 202* 149* 148* 134*  BILITOT 0.7 0.7 0.9 0.5  PROT 7.1 6.5 6.3* 6.2*  ALBUMIN  3.0* 2.1* 1.9* 1.8*   No results for input(s): LIPASE, AMYLASE in the last 168 hours. No results for input(s): AMMONIA in the last 168 hours. Coagulation Profile: No results for  input(s): INR, PROTIME in the last 168 hours. Cardiac Enzymes: Recent Labs  Lab 04/19/24 1530  CKTOTAL 189   BNP (last 3 results) No results for input(s): PROBNP in the last 8760 hours. HbA1C: Recent Labs    04/20/24 0205  HGBA1C 6.6*   CBG: Recent Labs  Lab 04/21/24 0615 04/21/24 1105 04/21/24 1612 04/21/24 2106 04/22/24 0611  GLUCAP 89 125* 169* 131* 114*   Lipid Profile: No results for input(s): CHOL, HDL, LDLCALC, TRIG, CHOLHDL, LDLDIRECT in the last 72 hours. Thyroid  Function Tests: Recent Labs    04/20/24 0500  TSH 0.742   Anemia Panel: No results for input(s): VITAMINB12, FOLATE, FERRITIN, TIBC, IRON, RETICCTPCT in the last 72 hours. Sepsis Labs: Recent Labs  Lab 04/20/24 0205 04/20/24 0905 04/22/24 0632  PROCALCITON 6.79  --  4.89  LATICACIDVEN  --  3.1*  --     Recent Results (from the past 240 hours)  Urine Culture     Status: None   Collection Time: 04/18/24  7:24 AM   Specimen: Urine  Result Value Ref Range Status   MICRO NUMBER: [id number]  Final   SPECIMEN QUALITY: Adequate  Final   Sample Source URINE  Final   STATUS: FINAL  Final   Result: No Growth  Final  Resp panel by RT-PCR (RSV, Flu A&B, Covid) Anterior Nasal Swab     Status: None   Collection Time: 04/19/24  2:46 PM   Specimen: Anterior Nasal Swab  Result Value Ref Range Status   SARS Coronavirus 2 by RT PCR  NEGATIVE NEGATIVE Final    Comment: (NOTE) SARS-CoV-2 target nucleic acids are NOT DETECTED.  The SARS-CoV-2 RNA is generally detectable in upper respiratory specimens during the acute phase of infection. The lowest concentration of SARS-CoV-2 viral copies this assay can detect is 138 copies/mL. A negative result does not preclude SARS-Cov-2 infection and should not be used as the sole basis for treatment or other patient management decisions. A negative result may occur with  improper specimen collection/handling, submission of specimen other than nasopharyngeal swab, presence of viral mutation(s) within the areas targeted by this assay, and inadequate number of viral copies(<138 copies/mL). A negative result must be combined with clinical observations, patient history, and epidemiological information. The expected result is Negative.  Fact Sheet for Patients:  BloggerCourse.com  Fact Sheet for Healthcare Providers:  SeriousBroker.it  This test is no t yet approved or cleared by the [place]  FDA and  has been authorized for detection and/or diagnosis of SARS-CoV-2 by FDA under an Emergency Use Authorization (EUA). This EUA will remain  in effect (meaning this test can be used) for the duration of the COVID-19 declaration under Section 564(b)(1) of the Act, 21 U.S.C.section 360bbb-3(b)(1), unless the authorization is terminated  or revoked sooner.       Influenza A by PCR NEGATIVE NEGATIVE Final   Influenza B by PCR NEGATIVE NEGATIVE Final    Comment: (NOTE) The Xpert Xpress SARS-CoV-2/FLU/RSV plus assay is intended as an aid in the diagnosis of influenza from Nasopharyngeal swab specimens and should not be used as a sole basis for treatment. Nasal washings and aspirates are unacceptable for Xpert Xpress SARS-CoV-2/FLU/RSV testing.  Fact Sheet for Patients: BloggerCourse.com  Fact Sheet for  Healthcare Providers: SeriousBroker.it  This test is not yet approved or cleared by the [place]  FDA and has been authorized for detection and/or diagnosis of SARS-CoV-2 by FDA under an Emergency Use Authorization (EUA). This EUA will remain in effect (meaning  this test can be used) for the duration of the COVID-19 declaration under Section 564(b)(1) of the Act, 21 U.S.C. section 360bbb-3(b)(1), unless the authorization is terminated or revoked.     Resp Syncytial Virus by PCR NEGATIVE NEGATIVE Final    Comment: (NOTE) Fact Sheet for Patients: BloggerCourse.com  Fact Sheet for Healthcare Providers: SeriousBroker.it  This test is not yet approved or cleared by the [place]  FDA and has been authorized for detection and/or diagnosis of SARS-CoV-2 by FDA under an Emergency Use Authorization (EUA). This EUA will remain in effect (meaning this test can be used) for the duration of the COVID-19 declaration under Section 564(b)(1) of the Act, 21 U.S.C. section 360bbb-3(b)(1), unless the authorization is terminated or revoked.  Performed at [facility], [address] [place], [place] [address]   MRSA Next Gen by PCR, Nasal     Status: None   Collection Time: 04/19/24  8:57 PM   Specimen: Nasal Mucosa; Nasal Swab  Result Value Ref Range Status   MRSA by PCR Next Gen NOT DETECTED NOT DETECTED Final    Comment: (NOTE) The GeneXpert MRSA Assay (FDA approved for NASAL specimens only), is one component of a comprehensive MRSA colonization surveillance program. It is not intended to diagnose MRSA infection nor to guide or monitor treatment for MRSA infections. Test performance is not FDA approved in patients less than 53 years old. Performed at [facility], [address] [place], [place] [address]   Culture, blood (Routine X 2) w Reflex to ID Panel     Status: None (Preliminary  result)   Collection Time: 04/20/24  9:05 AM   Specimen: BLOOD  Result Value Ref Range Status   Specimen Description BLOOD SITE NOT SPECIFIED  Final   Special Requests   Final    BOTTLES DRAWN AEROBIC ONLY Blood Culture results may not be optimal due to an inadequate volume of blood received in culture bottles   Culture   Final    NO GROWTH 2 DAYS Performed at [facility], [address], [place], [place] [address]    Report Status PENDING  Incomplete  Culture, blood (Routine X 2) w Reflex to ID Panel     Status: None (Preliminary result)   Collection Time: 04/20/24  1:37 PM   Specimen: BLOOD RIGHT ARM  Result Value Ref Range Status   Specimen Description BLOOD RIGHT ARM  Final   Special Requests   Final    BOTTLES DRAWN AEROBIC AND ANAEROBIC Blood Culture results may not be optimal due to an inadequate volume of blood received in culture bottles   Culture   Final    NO GROWTH 2 DAYS Performed at [facility], [address], [place], [place] [address]    Report Status PENDING  Incomplete  Expectorated Sputum Assessment w Gram Stain, Rflx to Resp Cult     Status: None   Collection Time: 04/20/24  8:37 PM   Specimen: Expectorated Sputum  Result Value Ref Range Status   Specimen Description EXPECTORATED SPUTUM  Final   Special Requests NONE  Final   Sputum evaluation   Final    Sputum specimen not acceptable for testing.  Please recollect.   Gram Stain Report Called to,Read Back By and Verified With: RN RONAL RADFORD [phone] @2325  FH Performed at [facility], [address] [place], [place] [address]    Report Status 04/20/2024 FINAL  Final  Expectorated Sputum Assessment  w Gram Stain, Rflx to Resp Cult     Status: None   Collection Time: 04/21/24  4:00 AM   Specimen: Expectorated Sputum  Result Value Ref Range Status   Specimen Description EXPECTORATED SPUTUM  Final   Special Requests NONE  Final   Sputum evaluation   Final    THIS SPECIMEN IS ACCEPTABLE FOR  SPUTUM CULTURE Performed at [facility], [address], [place], [place] [address]    Report Status 04/21/2024 FINAL  Final  Culture, Respiratory w Gram Stain     Status: None (Preliminary result)   Collection Time: 04/21/24  4:00 AM  Result Value Ref Range Status   Specimen Description EXPECTORATED SPUTUM  Final   Special Requests NONE Reflexed from [id number]  Final   Gram Stain   Final    FEW WBC PRESENT, PREDOMINANTLY PMN FEW BUDDING YEAST SEEN Performed at [facility], [address] [place], [place] [address]    Culture PENDING  Incomplete   Report Status PENDING  Incomplete         Radiology Studies: US  CHEST (PLEURAL EFFUSION) Result Date: 04/21/2024 CLINICAL DATA:  Right pleural effusion. EXAM: CHEST ULTRASOUND COMPARISON:  04/19/2024. FINDINGS: There is a trace right pleural effusion. No pleural effusion is seen on the left. IMPRESSION: Trace right pleural effusion. Electronically Signed   By: Leita Birmingham M.D.   On: 04/21/2024 14:02   DG Chest Port 1 View Result Date: 04/20/2024 CLINICAL DATA:  [id number] Dyspnea [id number] EXAM: PORTABLE CHEST 1 VIEW COMPARISON:  Chest x-ray 04/18/2024, CT chest 04/19/2024 FINDINGS: The heart and mediastinal contours are unchanged. Atherosclerotic plaque Interval worsening of patchy airspace and interstitial opacities of the right lung with almost complete opacification of the mid lower lung zones. No pulmonary edema. Persistent right pleural effusion. Possible trace left pleural effusion. No pneumothorax. No acute osseous abnormality. Left shoulder surgical anchor sutures. IMPRESSION: 1. Interval worsening of patchy airspace and interstitial opacities of the right lung with almost complete opacification of the mid lower lung zones. 2. Persistent right pleural effusion. 3. Possible trace left pleural effusion. 4.  Aortic Atherosclerosis (ICD10-I70.0). Electronically Signed   By: Morgane  Naveau M.D.   On: 04/20/2024 17:21            LOS: 3 days   Time spent= 35 mins    Burgess JAYSON Dare, MD [facility]  If 7PM-7AM, please contact night-coverage  04/22/2024, 10:29 AM

## 2024-04-23 DIAGNOSIS — J189 Pneumonia, unspecified organism: Secondary | ICD-10-CM | POA: Diagnosis not present

## 2024-04-23 LAB — GLUCOSE, CAPILLARY
Glucose-Capillary: 91 mg/dL (ref 70–99)
Glucose-Capillary: 170 mg/dL — ABNORMAL HIGH (ref 70–99)
Glucose-Capillary: 138 mg/dL — ABNORMAL HIGH (ref 70–99)
Glucose-Capillary: 119 mg/dL — ABNORMAL HIGH (ref 70–99)

## 2024-04-23 LAB — CBC
HCT: 36 % — ABNORMAL LOW (ref 39.0–52.0)
Hemoglobin: 12.6 g/dL — ABNORMAL LOW (ref 13.0–17.0)
MCH: 29.4 pg (ref 26.0–34.0)
MCHC: 35 g/dL (ref 30.0–36.0)
MCV: 84.1 fL (ref 80.0–100.0)
Platelets: 291 K/uL (ref 150–400)
RBC: 4.28 MIL/uL (ref 4.22–5.81)
RDW: 14.9 % (ref 11.5–15.5)
WBC: 24.7 K/uL — ABNORMAL HIGH (ref 4.0–10.5)
nRBC: 0 % (ref 0.0–0.2)

## 2024-04-23 LAB — COMPREHENSIVE METABOLIC PANEL WITH GFR
ALT: 31 U/L (ref 0–44)
AST: 73 U/L — ABNORMAL HIGH (ref 15–41)
Albumin: 1.7 g/dL — ABNORMAL LOW (ref 3.5–5.0)
Alkaline Phosphatase: 126 U/L (ref 38–126)
Anion gap: 10 (ref 5–15)
BUN: 24 mg/dL — ABNORMAL HIGH (ref 8–23)
CO2: 25 mmol/L (ref 22–32)
Calcium: 7.7 mg/dL — ABNORMAL LOW (ref 8.9–10.3)
Chloride: 100 mmol/L (ref 98–111)
Creatinine, Ser: 1.17 mg/dL (ref 0.61–1.24)
GFR, Estimated: >60 mL/min (ref >60)
Glucose, Bld: 96 mg/dL (ref 70–99)
Potassium: 3.6 mmol/L (ref 3.5–5.1)
Sodium: 135 mmol/L (ref 135–145)
Total Bilirubin: 0.9 mg/dL (ref 0.0–1.2)
Total Protein: 5.7 g/dL — ABNORMAL LOW (ref 6.5–8.1)

## 2024-04-23 LAB — CULTURE, RESPIRATORY W GRAM STAIN

## 2024-04-23 LAB — STREP PNEUMONIAE URINARY ANTIGEN: Strep Pneumo Urinary Antigen: NEGATIVE

## 2024-04-23 LAB — C DIFFICILE (CDIFF) QUICK SCRN (NO PCR REFLEX)
C Diff antigen: NEGATIVE
C Diff interpretation: NOT DETECTED
C Diff toxin: NEGATIVE

## 2024-04-23 LAB — MAGNESIUM: Magnesium: 2.3 mg/dL (ref 1.7–2.4)

## 2024-04-23 MED ORDER — ACETAMINOPHEN 325 MG PO TABS
650.0000 mg | ORAL_TABLET | Freq: Four times a day (QID) | ORAL | Status: DC | PRN
  Administered 2024-04-23 – 2024-04-26 (×6): 650 mg via ORAL
  Filled 2024-04-23 (×7): qty 2

## 2024-04-23 MED ORDER — LINEZOLID 600 MG/300ML IV SOLN
600.0000 mg | Freq: Two times a day (BID) | INTRAVENOUS | Status: DC
  Administered 2024-04-23 – 2024-04-26 (×7): 600 mg via INTRAVENOUS
  Filled 2024-04-23 (×8): qty 300

## 2024-04-23 NOTE — Consult Note (Addendum)
 [facility]    Date of Admission:  04/19/2024   Total days of antibiotics: 5 ceftriaxone zenovia               Reason for Consult: Persistent pnuemonia    Referring Provider: Dr Caleen   Assessment: Pneumonia, Fever (persistent) On Prednisone  5g  Plan: Would change anbx to linezolid , zosyn  Await legionela ag (seems less likely with azithro?) Would consider BAL Send histo, blasto tests.  Send C diff  Comment- Would consider BAL.  He ha a low-mod risk of TB- he's been on steroids (albeit a low dose) and he has been in the [employer].  I don't think the candida is significant.  Change in anbx will provider MRSA and Pseudomonas coverage.   Thank you so much for this interesting consult,  Principal Problem:   CAP (community acquired pneumonia) Active Problems:   Acute hypoxic respiratory failure (HCC)   Sepsis (HCC)   A-fib (HCC)   AKI (acute kidney injury) (HCC)   Transaminitis   Hyperglycemia    aspirin  EC  81 mg Oral Daily   atorvastatin   80 mg Oral QPM   enoxaparin  (LOVENOX ) injection  40 mg Subcutaneous Q24H   guaiFENesin   1,200 mg Oral BID   insulin  aspart  0-6 Units Subcutaneous TID WC   ipratropium-albuterol   3 mL Nebulization BID   metoprolol  tartrate  25 mg Oral BID   predniSONE   5 mg Oral Q breakfast   sodium chloride  flush  3 mL Intravenous Q12H    HPI: Brandon Dixon is a 78 y.o. male with hx of PMR (on prednisone  5mg /day), HTN, pre-diabetes, BPH, GERD.  Was seen by his PCP on 6-30 for 1.5 weeks of cough productive of yellow sputum which was keeping him awake at night, and felt to have RLL pna- he was given IM ceftriaxone  then augmentin  with planned 10 day course.  He was seen again in PCP office 7-2 hypoxia to 84% with exertion, GI (upset from augmentin ) and was sent to ED.  There he was found to have temp 102.6, and to have WBC 18.6 and CT showed RLL consolidation, small effusion and an 8mm nodule in the lingula.  He  was also found to have AKI (0.8--> 1.4) and afib with RVR (new).  He was started on ceftriaxone  and azithro.  He became fluid overloaded in hospital but this has improved with lasix . He is now off O2.  His WBC has continued to increase (now 24.7).  His temp is up to 103.2 this am.  Sputum Cx (7-4) C albicans.  RVP (-) CT 7-5 showed: Dense consolidation in the right middle lobe and right lower lobe and to a lesser extent left lower lobe compatible with multifocal pneumonia.    ROS- he denies sick contacts.  He has been in the [employer] and [place]. He has not been incarcerated or homeless.  He denies TB exposure.  He is a farmer  Review of Systems: Review of Systems  Constitutional:  Positive for fever. Negative for chills.  Respiratory:  Positive for cough and sputum production.   Gastrointestinal:  Positive for diarrhea. Negative for constipation.  Genitourinary:  Negative for dysuria.    Past Medical History:  Diagnosis Date   ALLERGIC RHINITIS 09/26/2007   BENIGN PROSTATIC HYPERTROPHY 05/27/2007   BPH with urinary obstruction    COLONIC POLYPS, HX OF 05/27/2007   DERMATITIS 08/25/2010   GERD 05/27/2007   HYPERCHOLESTEROLEMIA 09/26/2007  Peripheral vascular disease (HCC)     Social History   Tobacco Use   Smoking status: Former    Current packs/day: 0.00    Types: Cigarettes    Quit date: 10/19/2001    Years since quitting: 22.5   Smokeless tobacco: Never  Vaping Use   Vaping status: Never Used  Substance Use Topics   Alcohol use: Yes    Alcohol/week: 6.0 standard drinks of alcohol    Types: 6 Cans of beer per week   Drug use: No    Family History  Problem Relation Age of Onset   Heart disease Mother    Heart attack Mother        46    Cancer Father        lung ca   Hypertension Sister    Diabetes Brother    Hypertension Sister    Diabetes Brother    Diabetes Brother    Diabetes Brother    Diabetes Brother    Diabetes Daughter       Medications: Scheduled:  aspirin  EC  81 mg Oral Daily   atorvastatin   80 mg Oral QPM   enoxaparin  (LOVENOX ) injection  40 mg Subcutaneous Q24H   guaiFENesin   1,200 mg Oral BID   insulin  aspart  0-6 Units Subcutaneous TID WC   ipratropium-albuterol   3 mL Nebulization BID   metoprolol  tartrate  25 mg Oral BID   predniSONE   5 mg Oral Q breakfast   sodium chloride  flush  3 mL Intravenous Q12H    Abtx:  Anti-infectives (From admission, onward)    Start     Dose/Rate Route Frequency Ordered Stop   04/22/24 1000  piperacillin -tazobactam (ZOSYN ) IVPB 3.375 g        3.375 g 12.5 mL/hr over 240 Minutes Intravenous Every 8 hours 04/22/24 0909     04/20/24 1600  azithromycin  (ZITHROMAX ) tablet 500 mg        500 mg Oral Daily 04/19/24 2233 04/21/24 1507   04/20/24 1600  cefTRIAXone  (ROCEPHIN ) 1 g in sodium chloride  0.9 % 100 mL IVPB  Status:  Discontinued        1 g 200 mL/hr over 30 Minutes Intravenous Every 24 hours 04/19/24 2233 04/22/24 0856   04/19/24 1600  cefTRIAXone  (ROCEPHIN ) 1 g in sodium chloride  0.9 % 100 mL IVPB        1 g 200 mL/hr over 30 Minutes Intravenous  Once 04/19/24 1548 04/19/24 1749   04/19/24 1600  azithromycin  (ZITHROMAX ) 500 mg in sodium chloride  0.9 % 250 mL IVPB        500 mg 250 mL/hr over 60 Minutes Intravenous  Once 04/19/24 1548 04/19/24 1808         OBJECTIVE: Blood pressure (!) 156/76, pulse 100, temperature (!) 103.2 F (39.6 C), temperature source Rectal, resp. rate 20, height 6' (1.829 m), weight 77.2 kg, SpO2 94%.  Physical Exam Vitals reviewed.  Constitutional:      General: He is not in acute distress.    Appearance: He is well-developed. He is not ill-appearing.  Eyes:     Extraocular Movements: Extraocular movements intact.     Pupils: Pupils are equal, round, and reactive to light.  Cardiovascular:     Rate and Rhythm: Normal rate and regular rhythm.  Pulmonary:     Effort: Pulmonary effort is normal.     Breath sounds: Normal  breath sounds.  Abdominal:     General: Bowel sounds are normal.     Palpations:  Abdomen is soft.     Tenderness: There is no abdominal tenderness.  Musculoskeletal:     Cervical back: Normal range of motion.     Right lower leg: No edema.     Left lower leg: No edema.  Lymphadenopathy:     Cervical: No cervical adenopathy.  Neurological:     General: No focal deficit present.     Mental Status: He is alert.     Lab Results Results for orders placed or performed during the hospital encounter of 04/19/24 (from the past 48 hours)  Glucose, capillary     Status: Abnormal   Collection Time: 04/21/24 11:05 AM  Result Value Ref Range   Glucose-Capillary 125 (H) 70 - 99 mg/dL    Comment: Glucose reference range applies only to samples taken after fasting for at least 8 hours.  Glucose, capillary     Status: Abnormal   Collection Time: 04/21/24  4:12 PM  Result Value Ref Range   Glucose-Capillary 169 (H) 70 - 99 mg/dL    Comment: Glucose reference range applies only to samples taken after fasting for at least 8 hours.  Glucose, capillary     Status: Abnormal   Collection Time: 04/21/24  9:06 PM  Result Value Ref Range   Glucose-Capillary 131 (H) 70 - 99 mg/dL    Comment: Glucose reference range applies only to samples taken after fasting for at least 8 hours.  Glucose, capillary     Status: Abnormal   Collection Time: 04/22/24  6:11 AM  Result Value Ref Range   Glucose-Capillary 114 (H) 70 - 99 mg/dL    Comment: Glucose reference range applies only to samples taken after fasting for at least 8 hours.  CBC     Status: Abnormal   Collection Time: 04/22/24  6:32 AM  Result Value Ref Range   WBC 22.7 (H) 4.0 - 10.5 K/uL   RBC 4.59 4.22 - 5.81 MIL/uL   Hemoglobin 13.5 13.0 - 17.0 g/dL   HCT 60.9 60.9 - 47.9 %   MCV 85.0 80.0 - 100.0 fL   MCH 29.4 26.0 - 34.0 pg   MCHC 34.6 30.0 - 36.0 g/dL   RDW 84.5 88.4 - 84.4 %   Platelets 273 150 - 400 K/uL   nRBC 0.0 0.0 - 0.2 %     Comment: Performed at [facility], [address] [place], [place] [address]  Comprehensive metabolic panel with GFR     Status: Abnormal   Collection Time: 04/22/24  6:32 AM  Result Value Ref Range   Sodium 134 (L) 135 - 145 mmol/L   Potassium 3.7 3.5 - 5.1 mmol/L   Chloride 98 98 - 111 mmol/L   CO2 23 22 - 32 mmol/L   Glucose, Bld 115 (H) 70 - 99 mg/dL    Comment: Glucose reference range applies only to samples taken after fasting for at least 8 hours.   BUN 28 (H) 8 - 23 mg/dL   Creatinine, Ser 8.76 0.61 - 1.24 mg/dL   Calcium  8.2 (L) 8.9 - 10.3 mg/dL   Total Protein 6.2 (L) 6.5 - 8.1 g/dL   Albumin  1.8 (L) 3.5 - 5.0 g/dL   AST 77 (H) 15 - 41 U/L   ALT 33 0 - 44 U/L   Alkaline Phosphatase 134 (H) 38 - 126 U/L   Total Bilirubin 0.5 0.0 - 1.2 mg/dL   GFR, Estimated >39 >39 mL/min    Comment: (NOTE) Calculated using the CKD-EPI  Creatinine Equation (2021)    Anion gap 13 5 - 15    Comment: Performed at [facility], [address] [place], [place] [address]  Magnesium     Status: None   Collection Time: 04/22/24  6:32 AM  Result Value Ref Range   Magnesium 2.3 1.7 - 2.4 mg/dL    Comment: Performed at [facility], [address] [place], [place] [address]  Procalcitonin     Status: None   Collection Time: 04/22/24  6:32 AM  Result Value Ref Range   Procalcitonin 4.89 ng/mL    Comment:        Interpretation: PCT > 2 ng/mL: Systemic infection (sepsis) is likely, unless other causes are known. (NOTE)       Sepsis PCT Algorithm           Lower Respiratory Tract                                      Infection PCT Algorithm    ----------------------------     ----------------------------         PCT < 0.25 ng/mL                PCT < 0.10 ng/mL          Strongly encourage             Strongly discourage   discontinuation of antibiotics    initiation of antibiotics    ----------------------------     -----------------------------       PCT 0.25 - 0.50 ng/mL             PCT 0.10 - 0.25 ng/mL               OR       >80% decrease in PCT            Discourage initiation of                                            antibiotics      Encourage discontinuation           of antibiotics    ----------------------------     -----------------------------         PCT >= 0.50 ng/mL              PCT 0.26 - 0.50 ng/mL               AND       <80% decrease in PCT              Encourage initiation of                                             antibiotics       Encourage continuation           of antibiotics    ----------------------------     -----------------------------        PCT >= 0.50 ng/mL                  PCT > 0.50 ng/mL  AND         increase in PCT                  Strongly encourage                                      initiation of antibiotics    Strongly encourage escalation           of antibiotics                                     -----------------------------                                           PCT <= 0.25 ng/mL                                                 OR                                        > 80% decrease in PCT                                      Discontinue / Do not initiate                                             antibiotics  Performed at [facility], [address] [place], [place] [address]   Brain natriuretic peptide     Status: Abnormal   Collection Time: 04/22/24  6:32 AM  Result Value Ref Range   B Natriuretic Peptide 193.6 (H) 0.0 - 100.0 pg/mL    Comment: Performed at [facility], [address] [place], [place] [address]  Respiratory (~20 pathogens) panel by PCR     Status: None   Collection Time: 04/22/24  8:57 AM   Specimen: Nasopharyngeal Swab; Respiratory  Result Value Ref Range   Adenovirus NOT DETECTED NOT DETECTED   Coronavirus 229E NOT DETECTED NOT DETECTED    Comment: (NOTE) The Coronavirus on the Respiratory Panel, DOES NOT test for the novel  Coronavirus  (2019 nCoV)    Coronavirus HKU1 NOT DETECTED NOT DETECTED   Coronavirus NL63 NOT DETECTED NOT DETECTED   Coronavirus OC43 NOT DETECTED NOT DETECTED   Metapneumovirus NOT DETECTED NOT DETECTED   Rhinovirus / Enterovirus NOT DETECTED NOT DETECTED   Influenza A NOT DETECTED NOT DETECTED   Influenza B NOT DETECTED NOT DETECTED   Parainfluenza Virus 1 NOT DETECTED NOT DETECTED   Parainfluenza Virus 2 NOT DETECTED NOT DETECTED   Parainfluenza Virus 3 NOT DETECTED NOT DETECTED   Parainfluenza Virus 4 NOT DETECTED NOT DETECTED   Respiratory Syncytial Virus NOT DETECTED NOT DETECTED   Bordetella pertussis NOT DETECTED NOT DETECTED   Bordetella  Parapertussis NOT DETECTED NOT DETECTED   Chlamydophila pneumoniae NOT DETECTED NOT DETECTED   Mycoplasma pneumoniae NOT DETECTED NOT DETECTED    Comment: Performed at [facility], [address] [place], [place] [address]  Glucose, capillary     Status: Abnormal   Collection Time: 04/22/24 10:58 AM  Result Value Ref Range   Glucose-Capillary 197 (H) 70 - 99 mg/dL    Comment: Glucose reference range applies only to samples taken after fasting for at least 8 hours.  Glucose, capillary     Status: Abnormal   Collection Time: 04/22/24  4:08 PM  Result Value Ref Range   Glucose-Capillary 152 (H) 70 - 99 mg/dL    Comment: Glucose reference range applies only to samples taken after fasting for at least 8 hours.  Glucose, capillary     Status: Abnormal   Collection Time: 04/22/24 10:10 PM  Result Value Ref Range   Glucose-Capillary 101 (H) 70 - 99 mg/dL    Comment: Glucose reference range applies only to samples taken after fasting for at least 8 hours.  CBC     Status: Abnormal   Collection Time: 04/23/24  2:45 AM  Result Value Ref Range   WBC 24.7 (H) 4.0 - 10.5 K/uL   RBC 4.28 4.22 - 5.81 MIL/uL   Hemoglobin 12.6 (L) 13.0 - 17.0 g/dL   HCT 63.9 (L) 60.9 - 47.9 %   MCV 84.1 80.0 - 100.0 fL   MCH 29.4 26.0 - 34.0 pg   MCHC 35.0 30.0 - 36.0  g/dL   RDW 85.0 88.4 - 84.4 %   Platelets 291 150 - 400 K/uL   nRBC 0.0 0.0 - 0.2 %    Comment: Performed at [facility], [address] [place], [place] [address]  Comprehensive metabolic panel with GFR     Status: Abnormal   Collection Time: 04/23/24  2:45 AM  Result Value Ref Range   Sodium 135 135 - 145 mmol/L   Potassium 3.6 3.5 - 5.1 mmol/L   Chloride 100 98 - 111 mmol/L   CO2 25 22 - 32 mmol/L   Glucose, Bld 96 70 - 99 mg/dL    Comment: Glucose reference range applies only to samples taken after fasting for at least 8 hours.   BUN 24 (H) 8 - 23 mg/dL   Creatinine, Ser 8.82 0.61 - 1.24 mg/dL   Calcium  7.7 (L) 8.9 - 10.3 mg/dL   Total Protein 5.7 (L) 6.5 - 8.1 g/dL   Albumin  1.7 (L) 3.5 - 5.0 g/dL   AST 73 (H) 15 - 41 U/L   ALT 31 0 - 44 U/L   Alkaline Phosphatase 126 38 - 126 U/L   Total Bilirubin 0.9 0.0 - 1.2 mg/dL   GFR, Estimated >39 >39 mL/min    Comment: (NOTE) Calculated using the CKD-EPI Creatinine Equation (2021)    Anion gap 10 5 - 15    Comment: Performed at [facility], [address] [place], [place] [address]  Magnesium     Status: None   Collection Time: 04/23/24  2:45 AM  Result Value Ref Range   Magnesium 2.3 1.7 - 2.4 mg/dL    Comment: Performed at [facility], [address], [place], [place] [address]  Glucose, capillary     Status: None   Collection Time: 04/23/24  6:14 AM  Result Value Ref Range   Glucose-Capillary 91 70 - 99 mg/dL    Comment: Glucose  reference range applies only to samples taken after fasting for at least 8 hours.   Comment 1 Notify RN    Comment 2 Document in Chart       Component Value Date/Time   SDES EXPECTORATED SPUTUM 04/21/2024 0400   SDES EXPECTORATED SPUTUM 04/21/2024 0400   SPECREQUEST NONE 04/21/2024 0400   SPECREQUEST NONE Reflexed from [id number] 04/21/2024 0400   CULT  04/21/2024 0400    FEW YEAST CULTURE REINCUBATED FOR BETTER GROWTH Performed at [facility], [address],  [place], [place] [address]    REPTSTATUS 04/21/2024 FINAL 04/21/2024 0400   REPTSTATUS PENDING 04/21/2024 0400   CT ABDOMEN PELVIS W CONTRAST Result Date: 04/22/2024 CLINICAL DATA:  Sepsis rule out infection EXAM: CT ABDOMEN AND PELVIS WITH CONTRAST TECHNIQUE: Multidetector CT imaging of the abdomen and pelvis was performed using the standard protocol following bolus administration of intravenous contrast. RADIATION DOSE REDUCTION: This exam was performed according to the departmental dose-optimization program which includes automated exposure control, adjustment of the mA and/or kV according to patient size and/or use of iterative reconstruction technique. CONTRAST:  75mL OMNIPAQUE  IOHEXOL  350 MG/ML SOLN COMPARISON:  03/29/2022 FINDINGS: Lower chest: Extensive consolidation noted in the right middle lobe and right lower lobe. Less pronounced patchy opacities in the left lower lobe. Findings compatible with pneumonia. Small right pleural effusion. A Hepatobiliary: No focal hepatic abnormality. Gallbladder unremarkable. Pancreas: No focal abnormality or ductal dilatation. Spleen: No focal abnormality.  Normal size. Adrenals/Urinary Tract: Left adrenal nodule measures 9 mm and is stable since prior study most compatible with adenoma. Right adrenal gland unremarkable. No renal or ureteral stones. No hydronephrosis. No focal renal abnormality. Stomach/Bowel: Scattered colonic diverticulosis. No active diverticulitis. Stomach and small bowel decompressed. No bowel obstruction or inflammatory process. Vascular/Lymphatic: Aortic atherosclerosis. No evidence of aneurysm or adenopathy. Reproductive: No visible focal abnormality. Other: No free fluid or free air. Musculoskeletal: No acute bony abnormality. IMPRESSION: Dense consolidation in the right middle lobe and right lower lobe and to a lesser extent left lower lobe compatible with multifocal pneumonia. Small right pleural effusion. No acute findings in the abdomen or  pelvis. Colonic diverticulosis. Aortic atherosclerosis. Electronically Signed   By: Franky Crease M.D.   On: 04/22/2024 20:06   Recent Results (from the past 240 hours)  Urine Culture     Status: None   Collection Time: 04/18/24  7:24 AM   Specimen: Urine  Result Value Ref Range Status   MICRO NUMBER: [id number]  Final   SPECIMEN QUALITY: Adequate  Final   Sample Source URINE  Final   STATUS: FINAL  Final   Result: No Growth  Final  Resp panel by RT-PCR (RSV, Flu A&B, Covid) Anterior Nasal Swab     Status: None   Collection Time: 04/19/24  2:46 PM   Specimen: Anterior Nasal Swab  Result Value Ref Range Status   SARS Coronavirus 2 by RT PCR NEGATIVE NEGATIVE Final    Comment: (NOTE) SARS-CoV-2 target nucleic acids are NOT DETECTED.  The SARS-CoV-2 RNA is generally detectable in upper respiratory specimens during the acute phase of infection. The lowest concentration of SARS-CoV-2 viral copies this assay can detect is 138 copies/mL. A negative result does not preclude SARS-Cov-2 infection and should not be used as the sole basis for treatment or other patient management decisions. A negative result may occur with  improper specimen collection/handling, submission of specimen other than nasopharyngeal swab, presence of viral mutation(s) within the areas targeted by this assay,  and inadequate number of viral copies(<138 copies/mL). A negative result must be combined with clinical observations, patient history, and epidemiological information. The expected result is Negative.  Fact Sheet for Patients:  BloggerCourse.com  Fact Sheet for Healthcare Providers:  SeriousBroker.it  This test is no t yet approved or cleared by the [place]  FDA and  has been authorized for detection and/or diagnosis of SARS-CoV-2 by FDA under an Emergency Use Authorization (EUA). This EUA will remain  in effect (meaning this test can be used) for the  duration of the COVID-19 declaration under Section 564(b)(1) of the Act, 21 U.S.C.section 360bbb-3(b)(1), unless the authorization is terminated  or revoked sooner.       Influenza A by PCR NEGATIVE NEGATIVE Final   Influenza B by PCR NEGATIVE NEGATIVE Final    Comment: (NOTE) The Xpert Xpress SARS-CoV-2/FLU/RSV plus assay is intended as an aid in the diagnosis of influenza from Nasopharyngeal swab specimens and should not be used as a sole basis for treatment. Nasal washings and aspirates are unacceptable for Xpert Xpress SARS-CoV-2/FLU/RSV testing.  Fact Sheet for Patients: BloggerCourse.com  Fact Sheet for Healthcare Providers: SeriousBroker.it  This test is not yet approved or cleared by the [place]  FDA and has been authorized for detection and/or diagnosis of SARS-CoV-2 by FDA under an Emergency Use Authorization (EUA). This EUA will remain in effect (meaning this test can be used) for the duration of the COVID-19 declaration under Section 564(b)(1) of the Act, 21 U.S.C. section 360bbb-3(b)(1), unless the authorization is terminated or revoked.     Resp Syncytial Virus by PCR NEGATIVE NEGATIVE Final    Comment: (NOTE) Fact Sheet for Patients: BloggerCourse.com  Fact Sheet for Healthcare Providers: SeriousBroker.it  This test is not yet approved or cleared by the [place]  FDA and has been authorized for detection and/or diagnosis of SARS-CoV-2 by FDA under an Emergency Use Authorization (EUA). This EUA will remain in effect (meaning this test can be used) for the duration of the COVID-19 declaration under Section 564(b)(1) of the Act, 21 U.S.C. section 360bbb-3(b)(1), unless the authorization is terminated or revoked.  Performed at [facility], [name] [place], [place] [address]   MRSA Next Gen by PCR, Nasal     Status: None    Collection Time: 04/19/24  8:57 PM   Specimen: Nasal Mucosa; Nasal Swab  Result Value Ref Range Status   MRSA by PCR Next Gen NOT DETECTED NOT DETECTED Final    Comment: (NOTE) The GeneXpert MRSA Assay (FDA approved for NASAL specimens only), is one component of a comprehensive MRSA colonization surveillance program. It is not intended to diagnose MRSA infection nor to guide or monitor treatment for MRSA infections. Test performance is not FDA approved in patients less than 22 years old. Performed at [facility], [address], [place], [place] [address]   Culture, blood (Routine X 2) w Reflex to ID Panel     Status: None (Preliminary result)   Collection Time: 04/20/24  9:05 AM   Specimen: BLOOD  Result Value Ref Range Status   Specimen Description BLOOD SITE NOT SPECIFIED  Final   Special Requests   Final    BOTTLES DRAWN AEROBIC ONLY Blood Culture results may not be optimal due to an inadequate volume of blood received in culture bottles   Culture   Final    NO GROWTH 3 DAYS Performed at [facility], [address] [place], [place] [address]  Report Status PENDING  Incomplete  Culture, blood (Routine X 2) w Reflex to ID Panel     Status: None (Preliminary result)   Collection Time: 04/20/24  1:37 PM   Specimen: BLOOD RIGHT ARM  Result Value Ref Range Status   Specimen Description BLOOD RIGHT ARM  Final   Special Requests   Final    BOTTLES DRAWN AEROBIC AND ANAEROBIC Blood Culture results may not be optimal due to an inadequate volume of blood received in culture bottles   Culture   Final    NO GROWTH 3 DAYS Performed at [facility], [address], [place], [place] [address]    Report Status PENDING  Incomplete  Expectorated Sputum Assessment w Gram Stain, Rflx to Resp Cult     Status: None   Collection Time: 04/20/24  8:37 PM   Specimen: Expectorated Sputum  Result Value Ref Range Status   Specimen Description EXPECTORATED SPUTUM  Final    Special Requests NONE  Final   Sputum evaluation   Final    Sputum specimen not acceptable for testing.  Please recollect.   Gram Stain Report Called to,Read Back By and Verified With: RN RONAL RADFORD [phone] @2325  FH Performed at [facility], [address], [place], [place] [address]    Report Status 04/20/2024 FINAL  Final  Expectorated Sputum Assessment w Gram Stain, Rflx to Resp Cult     Status: None   Collection Time: 04/21/24  4:00 AM   Specimen: Expectorated Sputum  Result Value Ref Range Status   Specimen Description EXPECTORATED SPUTUM  Final   Special Requests NONE  Final   Sputum evaluation   Final    THIS SPECIMEN IS ACCEPTABLE FOR SPUTUM CULTURE Performed at [facility], [address] [place], [place] [address]    Report Status 04/21/2024 FINAL  Final  Culture, Respiratory w Gram Stain     Status: None (Preliminary result)   Collection Time: 04/21/24  4:00 AM  Result Value Ref Range Status   Specimen Description EXPECTORATED SPUTUM  Final   Special Requests NONE Reflexed from [id number]  Final   Gram Stain   Final    FEW WBC PRESENT, PREDOMINANTLY PMN FEW BUDDING YEAST SEEN    Culture   Final    FEW YEAST CULTURE REINCUBATED FOR BETTER GROWTH Performed at [facility], [address] [place], [place] [address]    Report Status PENDING  Incomplete  Respiratory (~20 pathogens) panel by PCR     Status: None   Collection Time: 04/22/24  8:57 AM   Specimen: Nasopharyngeal Swab; Respiratory  Result Value Ref Range Status   Adenovirus NOT DETECTED NOT DETECTED Final   Coronavirus 229E NOT DETECTED NOT DETECTED Final    Comment: (NOTE) The Coronavirus on the Respiratory Panel, DOES NOT test for the novel  Coronavirus (2019 nCoV)    Coronavirus HKU1 NOT DETECTED NOT DETECTED Final   Coronavirus NL63 NOT DETECTED NOT DETECTED Final   Coronavirus OC43 NOT DETECTED NOT DETECTED Final   Metapneumovirus NOT DETECTED NOT DETECTED Final   Rhinovirus /  Enterovirus NOT DETECTED NOT DETECTED Final   Influenza A NOT DETECTED NOT DETECTED Final   Influenza B NOT DETECTED NOT DETECTED Final   Parainfluenza Virus 1 NOT DETECTED NOT DETECTED Final   Parainfluenza Virus 2 NOT DETECTED NOT DETECTED Final   Parainfluenza Virus 3 NOT DETECTED NOT DETECTED Final   Parainfluenza Virus 4 NOT DETECTED NOT DETECTED Final  Respiratory Syncytial Virus NOT DETECTED NOT DETECTED Final   Bordetella pertussis NOT DETECTED NOT DETECTED Final   Bordetella Parapertussis NOT DETECTED NOT DETECTED Final   Chlamydophila pneumoniae NOT DETECTED NOT DETECTED Final   Mycoplasma pneumoniae NOT DETECTED NOT DETECTED Final    Comment: Performed at [facility], [address], [place], [place] [address]    Microbiology: Recent Results (from the past 240 hours)  Urine Culture     Status: None   Collection Time: 04/18/24  7:24 AM   Specimen: Urine  Result Value Ref Range Status   MICRO NUMBER: [id number]  Final   SPECIMEN QUALITY: Adequate  Final   Sample Source URINE  Final   STATUS: FINAL  Final   Result: No Growth  Final  Resp panel by RT-PCR (RSV, Flu A&B, Covid) Anterior Nasal Swab     Status: None   Collection Time: 04/19/24  2:46 PM   Specimen: Anterior Nasal Swab  Result Value Ref Range Status   SARS Coronavirus 2 by RT PCR NEGATIVE NEGATIVE Final    Comment: (NOTE) SARS-CoV-2 target nucleic acids are NOT DETECTED.  The SARS-CoV-2 RNA is generally detectable in upper respiratory specimens during the acute phase of infection. The lowest concentration of SARS-CoV-2 viral copies this assay can detect is 138 copies/mL. A negative result does not preclude SARS-Cov-2 infection and should not be used as the sole basis for treatment or other patient management decisions. A negative result may occur with  improper specimen collection/handling, submission of specimen other than nasopharyngeal swab, presence of viral mutation(s) within the areas targeted  by this assay, and inadequate number of viral copies(<138 copies/mL). A negative result must be combined with clinical observations, patient history, and epidemiological information. The expected result is Negative.  Fact Sheet for Patients:  BloggerCourse.com  Fact Sheet for Healthcare Providers:  SeriousBroker.it  This test is no t yet approved or cleared by the [place]  FDA and  has been authorized for detection and/or diagnosis of SARS-CoV-2 by FDA under an Emergency Use Authorization (EUA). This EUA will remain  in effect (meaning this test can be used) for the duration of the COVID-19 declaration under Section 564(b)(1) of the Act, 21 U.S.C.section 360bbb-3(b)(1), unless the authorization is terminated  or revoked sooner.       Influenza A by PCR NEGATIVE NEGATIVE Final   Influenza B by PCR NEGATIVE NEGATIVE Final    Comment: (NOTE) The Xpert Xpress SARS-CoV-2/FLU/RSV plus assay is intended as an aid in the diagnosis of influenza from Nasopharyngeal swab specimens and should not be used as a sole basis for treatment. Nasal washings and aspirates are unacceptable for Xpert Xpress SARS-CoV-2/FLU/RSV testing.  Fact Sheet for Patients: BloggerCourse.com  Fact Sheet for Healthcare Providers: SeriousBroker.it  This test is not yet approved or cleared by the [place]  FDA and has been authorized for detection and/or diagnosis of SARS-CoV-2 by FDA under an Emergency Use Authorization (EUA). This EUA will remain in effect (meaning this test can be used) for the duration of the COVID-19 declaration under Section 564(b)(1) of the Act, 21 U.S.C. section 360bbb-3(b)(1), unless the authorization is terminated or revoked.     Resp Syncytial Virus by PCR NEGATIVE NEGATIVE Final    Comment: (NOTE) Fact Sheet for Patients: BloggerCourse.com  Fact  Sheet for Healthcare Providers: SeriousBroker.it  This test is not yet approved or cleared by the [place]  FDA and has been authorized for detection and/or diagnosis of SARS-CoV-2 by FDA under an Emergency  Use Authorization (EUA). This EUA will remain in effect (meaning this test can be used) for the duration of the COVID-19 declaration under Section 564(b)(1) of the Act, 21 U.S.C. section 360bbb-3(b)(1), unless the authorization is terminated or revoked.  Performed at [facility], [address] [place], [place] [address]   MRSA Next Gen by PCR, Nasal     Status: None   Collection Time: 04/19/24  8:57 PM   Specimen: Nasal Mucosa; Nasal Swab  Result Value Ref Range Status   MRSA by PCR Next Gen NOT DETECTED NOT DETECTED Final    Comment: (NOTE) The GeneXpert MRSA Assay (FDA approved for NASAL specimens only), is one component of a comprehensive MRSA colonization surveillance program. It is not intended to diagnose MRSA infection nor to guide or monitor treatment for MRSA infections. Test performance is not FDA approved in patients less than 8 years old. Performed at [facility], [address] [place], [place] [address]   Culture, blood (Routine X 2) w Reflex to ID Panel     Status: None (Preliminary result)   Collection Time: 04/20/24  9:05 AM   Specimen: BLOOD  Result Value Ref Range Status   Specimen Description BLOOD SITE NOT SPECIFIED  Final   Special Requests   Final    BOTTLES DRAWN AEROBIC ONLY Blood Culture results may not be optimal due to an inadequate volume of blood received in culture bottles   Culture   Final    NO GROWTH 3 DAYS Performed at [facility], [address], [place], [place] [address]    Report Status PENDING  Incomplete  Culture, blood (Routine X 2) w Reflex to ID Panel     Status: None (Preliminary result)   Collection Time: 04/20/24  1:37 PM   Specimen: BLOOD RIGHT ARM  Result Value Ref  Range Status   Specimen Description BLOOD RIGHT ARM  Final   Special Requests   Final    BOTTLES DRAWN AEROBIC AND ANAEROBIC Blood Culture results may not be optimal due to an inadequate volume of blood received in culture bottles   Culture   Final    NO GROWTH 3 DAYS Performed at [facility], [address] [place], [place] [address]    Report Status PENDING  Incomplete  Expectorated Sputum Assessment w Gram Stain, Rflx to Resp Cult     Status: None   Collection Time: 04/20/24  8:37 PM   Specimen: Expectorated Sputum  Result Value Ref Range Status   Specimen Description EXPECTORATED SPUTUM  Final   Special Requests NONE  Final   Sputum evaluation   Final    Sputum specimen not acceptable for testing.  Please recollect.   Gram Stain Report Called to,Read Back By and Verified With: RN RONAL RADFORD [phone] @2325  FH Performed at [facility], [address] [place], [place] [address]    Report Status 04/20/2024 FINAL  Final  Expectorated Sputum Assessment w Gram Stain, Rflx to Resp Cult     Status: None   Collection Time: 04/21/24  4:00 AM   Specimen: Expectorated Sputum  Result Value Ref Range Status   Specimen Description EXPECTORATED SPUTUM  Final   Special Requests NONE  Final   Sputum evaluation   Final    THIS SPECIMEN IS ACCEPTABLE FOR SPUTUM CULTURE Performed at [facility], [address], [place], [place] [address]    Report Status 04/21/2024 FINAL  Final  Culture, Respiratory w Gram Stain     Status: None (Preliminary result)   Collection Time: 04/21/24  4:00 AM  Result Value Ref Range Status   Specimen Description EXPECTORATED SPUTUM  Final   Special Requests NONE Reflexed from [id number]  Final   Gram Stain   Final    FEW WBC PRESENT, PREDOMINANTLY PMN FEW BUDDING YEAST SEEN    Culture   Final    FEW YEAST CULTURE REINCUBATED FOR BETTER GROWTH Performed at [facility], [address] [place], [place] [address]    Report Status PENDING   Incomplete  Respiratory (~20 pathogens) panel by PCR     Status: None   Collection Time: 04/22/24  8:57 AM   Specimen: Nasopharyngeal Swab; Respiratory  Result Value Ref Range Status   Adenovirus NOT DETECTED NOT DETECTED Final   Coronavirus 229E NOT DETECTED NOT DETECTED Final    Comment: (NOTE) The Coronavirus on the Respiratory Panel, DOES NOT test for the novel  Coronavirus (2019 nCoV)    Coronavirus HKU1 NOT DETECTED NOT DETECTED Final   Coronavirus NL63 NOT DETECTED NOT DETECTED Final   Coronavirus OC43 NOT DETECTED NOT DETECTED Final   Metapneumovirus NOT DETECTED NOT DETECTED Final   Rhinovirus / Enterovirus NOT DETECTED NOT DETECTED Final   Influenza A NOT DETECTED NOT DETECTED Final   Influenza B NOT DETECTED NOT DETECTED Final   Parainfluenza Virus 1 NOT DETECTED NOT DETECTED Final   Parainfluenza Virus 2 NOT DETECTED NOT DETECTED Final   Parainfluenza Virus 3 NOT DETECTED NOT DETECTED Final   Parainfluenza Virus 4 NOT DETECTED NOT DETECTED Final   Respiratory Syncytial Virus NOT DETECTED NOT DETECTED Final   Bordetella pertussis NOT DETECTED NOT DETECTED Final   Bordetella Parapertussis NOT DETECTED NOT DETECTED Final   Chlamydophila pneumoniae NOT DETECTED NOT DETECTED Final   Mycoplasma pneumoniae NOT DETECTED NOT DETECTED Final    Comment: Performed at [facility], [address] [place], [place] [address]    Radiographs and labs were personally reviewed by me.   Reyes Fenton, MD [facility] [phone] 04/23/2024, 11:04 AM

## 2024-04-23 NOTE — Progress Notes (Signed)
 PROGRESS NOTE    Brandon Dixon  FMW:[id number] DOB: Jan 04, 1946 DOA: 04/19/2024 PCP: Merna Huxley, NP    Brief Narrative:  78 y.o. male with hx of PAD, PMR on chronic prednisone , hypertension, hyperlipidemia, prediabetes, BPH, GERD, who is transferred from [facility] ED for community-acquired pneumonia with acute hypoxic respiratory failure.  Ongoing for almost 2 weeks, seen by outpatient PCP on 6/30 and was given IM Rocephin  followed by 10 days of Augmentin .  Augmentin  caused GI upset but went to go see PCP again were noted to be hypoxic therefore sent to the ER.  In the ER noted to have right lower lobe near complete consolidation on the CTA chest.  Started on IV Rocephin  and azithromycin .  Initially also in A-fib with RVR, new onset therefore started on p.o. metoprolol . Hospital course also complicated by fluid overload requiring IV Lasix .  Slowly he was weaned off oxygen down to room air.   Assessment & Plan:  Principal Problem:   CAP (community acquired pneumonia) Active Problems:   Acute hypoxic respiratory failure (HCC)   Sepsis (HCC)   A-fib (HCC)   AKI (acute kidney injury) (HCC)   Transaminitis   Hyperglycemia     Community-acquired pneumonia Acute hypoxic respiratory failure, secondary to above, on 8L  Sepsis, secondary to above, present on admission  Pleural effusion Failed outpatient treatment with Rocephin /Augmentin .   CTA shows near right lower lobe complete consolidation and his hypoxic.  Initially worsening oxygen requirement but improved after diuretics.  Due to persistent leukocytosis, CT chest abdomen pelvis performed which does not show any acute pathology.  IR attempted thoracentesis but minimal effusion.  MRSA swab is negative, broaden antibiotics to IV Zosyn , completed 3 days of azithromycin . -RVP, COVID/flu/RSV are negative. -Urine Legionella and strep - Blood cultures remain negative - Consulted infectious disease - Briefly discussed with  pulmonary, no role for bronchoscopy at this time  Atrial fibrillation, new onset, initially RVR now rate controlled He is now back in normal sinus rhythm.  Echo unremarkable.  TSH normal.  Hesitant to start any anticoagulation at this time.  Can consider outpatient ambulatory monitor  Acute congestive heart failure with preserved ejection fraction -Echo  shows preserved ef, received lasix .    Acute kidney injury stage I Baseline creatinine 0.8, elevated 1.4 on admission.  Creatinine now stable around 1.2   Acute liver injury, mixed pattern AST 110, alk phos 202, other LFT normal.  May be related to underlying systemic illness. - Trend LFT, trending down  Essential hypertension - Benicar  on hold due to mild AKI, IV as needed   History of prediabetes Hyperglycemia Chronic steroid use Hyperlipidemia Home regimen metformin  Accu-Cheks and sliding scale - A1c 6.6   Hyponatremia, mild, resolved Resolved and stable  Polymyalgia rheumatica - On prednisone  5 mg daily   Incidental findings: 8 mm nodule in the lingula: Outpatient surveillance imaging   PT/OT  DVT prophylaxis: enoxaparin  (LOVENOX ) injection 40 mg Start: 04/20/24 1000    Code Status: Full Code Family Communication: Family at bedside   Subjective: Seen at bedside, symptomatically feeling better Still having fevers overnight  Examination:  General exam: Appears calm and comfortable  Respiratory system: Some rhonchi especially on the right side Cardiovascular system: S1 & S2 heard, RRR. No JVD, murmurs, rubs, gallops or clicks. No pedal edema. Gastrointestinal system: Abdomen is nondistended, soft and nontender. No organomegaly or masses felt. Normal bowel sounds heard. Central nervous system: Alert and oriented. No focal neurological deficits. Extremities: Symmetric 5  x 5 power. Skin: No rashes, lesions or ulcers Psychiatry: Judgement and insight appear normal. Mood & affect appropriate.                 Diet Orders (From admission, onward)     Start     Ordered   04/19/24 2230  Diet Carb Modified Fluid consistency: Thin; Room service appropriate? Yes  Diet effective now       Question Answer Comment  Diet-HS Snack? Nothing   Calorie Level Medium 1600-2000   Fluid consistency: Thin   Room service appropriate? Yes      04/19/24 2233            Objective: Vitals:   04/22/24 2344 04/23/24 0309 04/23/24 0643 04/23/24 0743  BP: 139/77 (!) 161/78  (!) 156/76  Pulse: 81 87  100  Resp: 18 20    Temp: 99 F (37.2 C) 99.1 F (37.3 C)  (!) 100.6 F (38.1 C)  TempSrc: Oral Oral  Oral  SpO2: 91% 94%  94%  Weight:   77.2 kg   Height:        Intake/Output Summary (Last 24 hours) at 04/23/2024 0947 Last data filed at 04/23/2024 0900 Gross per 24 hour  Intake 364.14 ml  Output --  Net 364.14 ml   Filed Weights   04/19/24 2050 04/23/24 0643  Weight: 77.9 kg 77.2 kg    Scheduled Meds:  aspirin  EC  81 mg Oral Daily   atorvastatin   80 mg Oral QPM   enoxaparin  (LOVENOX ) injection  40 mg Subcutaneous Q24H   guaiFENesin   1,200 mg Oral BID   insulin  aspart  0-6 Units Subcutaneous TID WC   ipratropium-albuterol   3 mL Nebulization BID   metoprolol  tartrate  25 mg Oral BID   predniSONE   5 mg Oral Q breakfast   sodium chloride  flush  3 mL Intravenous Q12H   Continuous Infusions:  piperacillin -tazobactam (ZOSYN )  IV 3.375 g (04/23/24 0830)    Nutritional status     Body mass index is 23.08 kg/m.  Data Reviewed:   CBC: Recent Labs  Lab 04/19/24 1530 04/19/24 1536 04/20/24 0205 04/21/24 0336 04/22/24 0632 04/23/24 0245  WBC 18.6*  --  15.9* 21.1* 22.7* 24.7*  HGB 14.6 15.6 13.3 13.4 13.5 12.6*  HCT 43.0 46.0 39.5 39.9 39.0 36.0*  MCV 86.5  --  86.4 88.1 85.0 84.1  PLT 311  --  296 244 273 291   Basic Metabolic Panel: Recent Labs  Lab 04/19/24 1530 04/19/24 1536 04/20/24 0205 04/21/24 0853 04/22/24 0632 04/23/24 0245  NA 129* 132* 135 134* 134* 135  K  4.4 4.5 4.0 4.0 3.7 3.6  CL 94*  --  100 100 98 100  CO2 21*  --  21* 21* 23 25  GLUCOSE 302*  --  195* 120* 115* 96  BUN 23  --  22 29* 28* 24*  CREATININE 1.41*  --  1.42* 1.16 1.23 1.17  CALCIUM  8.8*  --  8.3* 8.3* 8.2* 7.7*  MG  --   --  2.2 2.5* 2.3 2.3  PHOS  --   --  3.6 3.6  --   --    GFR: Estimated Creatinine Clearance: 56.8 mL/min (by C-G formula based on SCr of 1.17 mg/dL). Liver Function Tests: Recent Labs  Lab 04/19/24 1530 04/20/24 0446 04/21/24 0853 04/22/24 0632 04/23/24 0245  AST 110* 86* 89* 77* 73*  ALT 41 37 37 33 31  ALKPHOS 202* 149* 148* 134* 126  BILITOT 0.7 0.7 0.9 0.5 0.9  PROT 7.1 6.5 6.3* 6.2* 5.7*  ALBUMIN  3.0* 2.1* 1.9* 1.8* 1.7*   No results for input(s): LIPASE, AMYLASE in the last 168 hours. No results for input(s): AMMONIA in the last 168 hours. Coagulation Profile: No results for input(s): INR, PROTIME in the last 168 hours. Cardiac Enzymes: Recent Labs  Lab 04/19/24 1530  CKTOTAL 189   BNP (last 3 results) No results for input(s): PROBNP in the last 8760 hours. HbA1C: No results for input(s): HGBA1C in the last 72 hours. CBG: Recent Labs  Lab 04/22/24 0611 04/22/24 1058 04/22/24 1608 04/22/24 2210 04/23/24 0614  GLUCAP 114* 197* 152* 101* 91   Lipid Profile: No results for input(s): CHOL, HDL, LDLCALC, TRIG, CHOLHDL, LDLDIRECT in the last 72 hours. Thyroid  Function Tests: No results for input(s): TSH, T4TOTAL, FREET4, T3FREE, THYROIDAB in the last 72 hours. Anemia Panel: No results for input(s): VITAMINB12, FOLATE, FERRITIN, TIBC, IRON, RETICCTPCT in the last 72 hours. Sepsis Labs: Recent Labs  Lab 04/20/24 0205 04/20/24 0905 04/22/24 0632  PROCALCITON 6.79  --  4.89  LATICACIDVEN  --  3.1*  --     Recent Results (from the past 240 hours)  Urine Culture     Status: None   Collection Time: 04/18/24  7:24 AM   Specimen: Urine  Result Value Ref Range Status   MICRO  NUMBER: [id number]  Final   SPECIMEN QUALITY: Adequate  Final   Sample Source URINE  Final   STATUS: FINAL  Final   Result: No Growth  Final  Resp panel by RT-PCR (RSV, Flu A&B, Covid) Anterior Nasal Swab     Status: None   Collection Time: 04/19/24  2:46 PM   Specimen: Anterior Nasal Swab  Result Value Ref Range Status   SARS Coronavirus 2 by RT PCR NEGATIVE NEGATIVE Final    Comment: (NOTE) SARS-CoV-2 target nucleic acids are NOT DETECTED.  The SARS-CoV-2 RNA is generally detectable in upper respiratory specimens during the acute phase of infection. The lowest concentration of SARS-CoV-2 viral copies this assay can detect is 138 copies/mL. A negative result does not preclude SARS-Cov-2 infection and should not be used as the sole basis for treatment or other patient management decisions. A negative result may occur with  improper specimen collection/handling, submission of specimen other than nasopharyngeal swab, presence of viral mutation(s) within the areas targeted by this assay, and inadequate number of viral copies(<138 copies/mL). A negative result must be combined with clinical observations, patient history, and epidemiological information. The expected result is Negative.  Fact Sheet for Patients:  BloggerCourse.com  Fact Sheet for Healthcare Providers:  SeriousBroker.it  This test is no t yet approved or cleared by the [place]  FDA and  has been authorized for detection and/or diagnosis of SARS-CoV-2 by FDA under an Emergency Use Authorization (EUA). This EUA will remain  in effect (meaning this test can be used) for the duration of the COVID-19 declaration under Section 564(b)(1) of the Act, 21 U.S.C.section 360bbb-3(b)(1), unless the authorization is terminated  or revoked sooner.       Influenza A by PCR NEGATIVE NEGATIVE Final   Influenza B by PCR NEGATIVE NEGATIVE Final    Comment: (NOTE) The Xpert Xpress  SARS-CoV-2/FLU/RSV plus assay is intended as an aid in the diagnosis of influenza from Nasopharyngeal swab specimens and should not be used as a sole basis for treatment. Nasal washings and aspirates are unacceptable for Xpert Xpress SARS-CoV-2/FLU/RSV testing.  Fact Sheet for  Patients: BloggerCourse.com  Fact Sheet for Healthcare Providers: SeriousBroker.it  This test is not yet approved or cleared by the [place]  FDA and has been authorized for detection and/or diagnosis of SARS-CoV-2 by FDA under an Emergency Use Authorization (EUA). This EUA will remain in effect (meaning this test can be used) for the duration of the COVID-19 declaration under Section 564(b)(1) of the Act, 21 U.S.C. section 360bbb-3(b)(1), unless the authorization is terminated or revoked.     Resp Syncytial Virus by PCR NEGATIVE NEGATIVE Final    Comment: (NOTE) Fact Sheet for Patients: BloggerCourse.com  Fact Sheet for Healthcare Providers: SeriousBroker.it  This test is not yet approved or cleared by the [place]  FDA and has been authorized for detection and/or diagnosis of SARS-CoV-2 by FDA under an Emergency Use Authorization (EUA). This EUA will remain in effect (meaning this test can be used) for the duration of the COVID-19 declaration under Section 564(b)(1) of the Act, 21 U.S.C. section 360bbb-3(b)(1), unless the authorization is terminated or revoked.  Performed at [facility], [address] [place], [place] [address]   MRSA Next Gen by PCR, Nasal     Status: None   Collection Time: 04/19/24  8:57 PM   Specimen: Nasal Mucosa; Nasal Swab  Result Value Ref Range Status   MRSA by PCR Next Gen NOT DETECTED NOT DETECTED Final    Comment: (NOTE) The GeneXpert MRSA Assay (FDA approved for NASAL specimens only), is one component of a comprehensive MRSA colonization  surveillance program. It is not intended to diagnose MRSA infection nor to guide or monitor treatment for MRSA infections. Test performance is not FDA approved in patients less than 69 years old. Performed at [facility], [address] [place], [place] [address]   Culture, blood (Routine X 2) w Reflex to ID Panel     Status: None (Preliminary result)   Collection Time: 04/20/24  9:05 AM   Specimen: BLOOD  Result Value Ref Range Status   Specimen Description BLOOD SITE NOT SPECIFIED  Final   Special Requests   Final    BOTTLES DRAWN AEROBIC ONLY Blood Culture results may not be optimal due to an inadequate volume of blood received in culture bottles   Culture   Final    NO GROWTH 3 DAYS Performed at [facility], [address] [place], [place] [address]    Report Status PENDING  Incomplete  Culture, blood (Routine X 2) w Reflex to ID Panel     Status: None (Preliminary result)   Collection Time: 04/20/24  1:37 PM   Specimen: BLOOD RIGHT ARM  Result Value Ref Range Status   Specimen Description BLOOD RIGHT ARM  Final   Special Requests   Final    BOTTLES DRAWN AEROBIC AND ANAEROBIC Blood Culture results may not be optimal due to an inadequate volume of blood received in culture bottles   Culture   Final    NO GROWTH 3 DAYS Performed at [facility], [address], [place], [place] [address]    Report Status PENDING  Incomplete  Expectorated Sputum Assessment w Gram Stain, Rflx to Resp Cult     Status: None   Collection Time: 04/20/24  8:37 PM   Specimen: Expectorated Sputum  Result Value Ref Range Status   Specimen Description EXPECTORATED SPUTUM  Final   Special Requests NONE  Final   Sputum evaluation   Final    Sputum specimen not acceptable for  testing.  Please recollect.   Gram Stain Report Called to,Read Back By and Verified With: RN RONAL RADFORD [phone] @2325  FH Performed at [name], [address], [place], [place] [address]    Report  Status 04/20/2024 FINAL  Final  Expectorated Sputum Assessment w Gram Stain, Rflx to Resp Cult     Status: None   Collection Time: 04/21/24  4:00 AM   Specimen: Expectorated Sputum  Result Value Ref Range Status   Specimen Description EXPECTORATED SPUTUM  Final   Special Requests NONE  Final   Sputum evaluation   Final    THIS SPECIMEN IS ACCEPTABLE FOR SPUTUM CULTURE Performed at [facility], [address], [place], [place] [address]    Report Status 04/21/2024 FINAL  Final  Culture, Respiratory w Gram Stain     Status: None (Preliminary result)   Collection Time: 04/21/24  4:00 AM  Result Value Ref Range Status   Specimen Description EXPECTORATED SPUTUM  Final   Special Requests NONE Reflexed from [id number]  Final   Gram Stain   Final    FEW WBC PRESENT, PREDOMINANTLY PMN FEW BUDDING YEAST SEEN    Culture   Final    FEW YEAST CULTURE REINCUBATED FOR BETTER GROWTH Performed at [facility], [address] [place], [place] [address]    Report Status PENDING  Incomplete  Respiratory (~20 pathogens) panel by PCR     Status: None   Collection Time: 04/22/24  8:57 AM   Specimen: Nasopharyngeal Swab; Respiratory  Result Value Ref Range Status   Adenovirus NOT DETECTED NOT DETECTED Final   Coronavirus 229E NOT DETECTED NOT DETECTED Final    Comment: (NOTE) The Coronavirus on the Respiratory Panel, DOES NOT test for the novel  Coronavirus (2019 nCoV)    Coronavirus HKU1 NOT DETECTED NOT DETECTED Final   Coronavirus NL63 NOT DETECTED NOT DETECTED Final   Coronavirus OC43 NOT DETECTED NOT DETECTED Final   Metapneumovirus NOT DETECTED NOT DETECTED Final   Rhinovirus / Enterovirus NOT DETECTED NOT DETECTED Final   Influenza A NOT DETECTED NOT DETECTED Final   Influenza B NOT DETECTED NOT DETECTED Final   Parainfluenza Virus 1 NOT DETECTED NOT DETECTED Final   Parainfluenza Virus 2 NOT DETECTED NOT DETECTED Final   Parainfluenza Virus 3 NOT DETECTED NOT DETECTED Final    Parainfluenza Virus 4 NOT DETECTED NOT DETECTED Final   Respiratory Syncytial Virus NOT DETECTED NOT DETECTED Final   Bordetella pertussis NOT DETECTED NOT DETECTED Final   Bordetella Parapertussis NOT DETECTED NOT DETECTED Final   Chlamydophila pneumoniae NOT DETECTED NOT DETECTED Final   Mycoplasma pneumoniae NOT DETECTED NOT DETECTED Final    Comment: Performed at [facility], [address] [place], [place] [address]         Radiology Studies: CT ABDOMEN PELVIS W CONTRAST Result Date: 04/22/2024 CLINICAL DATA:  Sepsis rule out infection EXAM: CT ABDOMEN AND PELVIS WITH CONTRAST TECHNIQUE: Multidetector CT imaging of the abdomen and pelvis was performed using the standard protocol following bolus administration of intravenous contrast. RADIATION DOSE REDUCTION: This exam was performed according to the departmental dose-optimization program which includes automated exposure control, adjustment of the mA and/or kV according to patient size and/or use of iterative reconstruction technique. CONTRAST:  75mL OMNIPAQUE  IOHEXOL  350 MG/ML SOLN COMPARISON:  03/29/2022 FINDINGS: Lower chest: Extensive consolidation noted in the right middle lobe and right lower lobe. Less pronounced patchy opacities in the left lower lobe. Findings  compatible with pneumonia. Small right pleural effusion. A Hepatobiliary: No focal hepatic abnormality. Gallbladder unremarkable. Pancreas: No focal abnormality or ductal dilatation. Spleen: No focal abnormality.  Normal size. Adrenals/Urinary Tract: Left adrenal nodule measures 9 mm and is stable since prior study most compatible with adenoma. Right adrenal gland unremarkable. No renal or ureteral stones. No hydronephrosis. No focal renal abnormality. Stomach/Bowel: Scattered colonic diverticulosis. No active diverticulitis. Stomach and small bowel decompressed. No bowel obstruction or inflammatory process. Vascular/Lymphatic: Aortic atherosclerosis. No evidence of aneurysm  or adenopathy. Reproductive: No visible focal abnormality. Other: No free fluid or free air. Musculoskeletal: No acute bony abnormality. IMPRESSION: Dense consolidation in the right middle lobe and right lower lobe and to a lesser extent left lower lobe compatible with multifocal pneumonia. Small right pleural effusion. No acute findings in the abdomen or pelvis. Colonic diverticulosis. Aortic atherosclerosis. Electronically Signed   By: Franky Crease M.D.   On: 04/22/2024 20:06   US  CHEST (PLEURAL EFFUSION) Result Date: 04/21/2024 CLINICAL DATA:  Right pleural effusion. EXAM: CHEST ULTRASOUND COMPARISON:  04/19/2024. FINDINGS: There is a trace right pleural effusion. No pleural effusion is seen on the left. IMPRESSION: Trace right pleural effusion. Electronically Signed   By: Leita Birmingham M.D.   On: 04/21/2024 14:02           LOS: 4 days   Time spent= 35 mins    Burgess JAYSON Dare, MD [facility]  If 7PM-7AM, please contact night-coverage  04/23/2024, 9:47 AM

## 2024-04-24 ENCOUNTER — Other Ambulatory Visit: Payer: Self-pay | Admitting: Infectious Disease

## 2024-04-24 DIAGNOSIS — J189 Pneumonia, unspecified organism: Secondary | ICD-10-CM | POA: Diagnosis not present

## 2024-04-24 DIAGNOSIS — J90 Pleural effusion, not elsewhere classified: Secondary | ICD-10-CM

## 2024-04-24 DIAGNOSIS — D72829 Elevated white blood cell count, unspecified: Secondary | ICD-10-CM

## 2024-04-24 LAB — COMPREHENSIVE METABOLIC PANEL WITH GFR
ALT: 40 U/L (ref 0–44)
AST: 110 U/L — ABNORMAL HIGH (ref 15–41)
Albumin: 1.7 g/dL — ABNORMAL LOW (ref 3.5–5.0)
Alkaline Phosphatase: 139 U/L — ABNORMAL HIGH (ref 38–126)
Anion gap: 10 (ref 5–15)
BUN: 20 mg/dL (ref 8–23)
CO2: 26 mmol/L (ref 22–32)
Calcium: 8 mg/dL — ABNORMAL LOW (ref 8.9–10.3)
Chloride: 100 mmol/L (ref 98–111)
Creatinine, Ser: 1.07 mg/dL (ref 0.61–1.24)
GFR, Estimated: >60 mL/min (ref >60)
Glucose, Bld: 124 mg/dL — ABNORMAL HIGH (ref 70–99)
Potassium: 3.5 mmol/L (ref 3.5–5.1)
Sodium: 136 mmol/L (ref 135–145)
Total Bilirubin: 0.6 mg/dL (ref 0.0–1.2)
Total Protein: 5.9 g/dL — ABNORMAL LOW (ref 6.5–8.1)

## 2024-04-24 LAB — CBC
HCT: 36.5 % — ABNORMAL LOW (ref 39.0–52.0)
Hemoglobin: 12.6 g/dL — ABNORMAL LOW (ref 13.0–17.0)
MCH: 28.9 pg (ref 26.0–34.0)
MCHC: 34.5 g/dL (ref 30.0–36.0)
MCV: 83.7 fL (ref 80.0–100.0)
Platelets: 376 K/uL (ref 150–400)
RBC: 4.36 MIL/uL (ref 4.22–5.81)
RDW: 14.8 % (ref 11.5–15.5)
WBC: 24.3 K/uL — ABNORMAL HIGH (ref 4.0–10.5)
nRBC: 0 % (ref 0.0–0.2)

## 2024-04-24 LAB — GLUCOSE, CAPILLARY
Glucose-Capillary: 119 mg/dL — ABNORMAL HIGH (ref 70–99)
Glucose-Capillary: 144 mg/dL — ABNORMAL HIGH (ref 70–99)
Glucose-Capillary: 208 mg/dL — ABNORMAL HIGH (ref 70–99)
Glucose-Capillary: 113 mg/dL — ABNORMAL HIGH (ref 70–99)

## 2024-04-24 LAB — MAGNESIUM: Magnesium: 2.3 mg/dL (ref 1.7–2.4)

## 2024-04-24 MED ORDER — BOOST / RESOURCE BREEZE PO LIQD CUSTOM
1.0000 | Freq: Three times a day (TID) | ORAL | Status: DC
  Administered 2024-04-24 – 2024-05-02 (×13): 1 via ORAL

## 2024-04-24 NOTE — Care Management Important Message (Signed)
 Important Message  Patient Details  Name: Brandon Dixon MRN: [id number] Date of Birth: 06/14/1946   Important Message Given:  Yes - Medicare IM  Patient left prior to IM delivery will mail to the patient home address.    Modine Oppenheimer 04/24/2024, 4:10 PM

## 2024-04-24 NOTE — Progress Notes (Signed)
 Subjective:  Still coughing purulent material up   Antibiotics:  Anti-infectives (From admission, onward)    Start     Dose/Rate Route Frequency Ordered Stop   04/23/24 1230  linezolid  (ZYVOX ) IVPB 600 mg        600 mg 300 mL/hr over 60 Minutes Intravenous Every 12 hours 04/23/24 1130     04/22/24 1000  piperacillin -tazobactam (ZOSYN ) IVPB 3.375 g        3.375 g 12.5 mL/hr over 240 Minutes Intravenous Every 8 hours 04/22/24 0909     04/20/24 1600  azithromycin  (ZITHROMAX ) tablet 500 mg        500 mg Oral Daily 04/19/24 2233 04/21/24 1507   04/20/24 1600  cefTRIAXone  (ROCEPHIN ) 1 g in sodium chloride  0.9 % 100 mL IVPB  Status:  Discontinued        1 g 200 mL/hr over 30 Minutes Intravenous Every 24 hours 04/19/24 2233 04/22/24 0856   04/19/24 1600  cefTRIAXone  (ROCEPHIN ) 1 g in sodium chloride  0.9 % 100 mL IVPB        1 g 200 mL/hr over 30 Minutes Intravenous  Once 04/19/24 1548 04/19/24 1749   04/19/24 1600  azithromycin  (ZITHROMAX ) 500 mg in sodium chloride  0.9 % 250 mL IVPB        500 mg 250 mL/hr over 60 Minutes Intravenous  Once 04/19/24 1548 04/19/24 1808       Medications: Scheduled Meds:  aspirin  EC  81 mg Oral Daily   atorvastatin   80 mg Oral QPM   enoxaparin  (LOVENOX ) injection  40 mg Subcutaneous Q24H   feeding supplement  1 Container Oral TID BM   guaiFENesin   1,200 mg Oral BID   insulin  aspart  0-6 Units Subcutaneous TID WC   metoprolol  tartrate  25 mg Oral BID   predniSONE   5 mg Oral Q breakfast   sodium chloride  flush  3 mL Intravenous Q12H   Continuous Infusions:  linezolid  (ZYVOX ) IV 600 mg (04/24/24 1042)   piperacillin -tazobactam (ZOSYN )  IV 3.375 g (04/24/24 1041)   PRN Meds:.acetaminophen , albuterol , hydrALAZINE , melatonin, metoprolol  tartrate, ondansetron  (ZOFRAN ) IV, polyethylene glycol, senna-docusate    Objective: Weight change:   Intake/Output Summary (Last 24 hours) at 04/24/2024 1128 Last data filed at 04/24/2024 0836 Gross  per 24 hour  Intake 923.77 ml  Output --  Net 923.77 ml   Blood pressure (!) 140/76, pulse 90, temperature 98.8 F (37.1 C), temperature source Oral, resp. rate 20, height 6' (1.829 m), weight 77.2 kg, SpO2 99%. Temp:  [98.7 F (37.1 C)-99.4 F (37.4 C)] 98.8 F (37.1 C) (07/07 0734) Pulse Rate:  [85-93] 90 (07/07 0734) Resp:  [14-20] 20 (07/07 0734) BP: (130-151)/(67-76) 140/76 (07/07 0734) SpO2:  [91 %-99 %] 99 % (07/07 0734)  Physical Exam: Physical Exam Constitutional:      Appearance: He is well-developed.  HENT:     Head: Normocephalic and atraumatic.  Eyes:     Conjunctiva/sclera: Conjunctivae normal.  Cardiovascular:     Rate and Rhythm: Normal rate and regular rhythm.     Heart sounds: No murmur heard.    No friction rub. No gallop.  Pulmonary:     Effort: Pulmonary effort is normal. No respiratory distress.     Breath sounds: No stridor. Rhonchi present. No wheezing.  Chest:     Chest wall: No tenderness.  Abdominal:     General: There is no distension.     Palpations: Abdomen is soft.  Musculoskeletal:  General: Normal range of motion.     Cervical back: Normal range of motion and neck supple.  Skin:    General: Skin is warm and dry.     Findings: No erythema or rash.  Neurological:     General: No focal deficit present.     Mental Status: He is alert and oriented to person, place, and time.  Psychiatric:        Mood and Affect: Mood normal.        Behavior: Behavior normal.        Thought Content: Thought content normal.        Judgment: Judgment normal.      CBC:    BMET Recent Labs    04/23/24 0245 04/24/24 0242  NA 135 136  K 3.6 3.5  CL 100 100  CO2 25 26  GLUCOSE 96 124*  BUN 24* 20  CREATININE 1.17 1.07  CALCIUM  7.7* 8.0*     Liver Panel  Recent Labs    04/23/24 0245 04/24/24 0242  PROT 5.7* 5.9*  ALBUMIN  1.7* 1.7*  AST 73* 110*  ALT 31 40  ALKPHOS 126 139*  BILITOT 0.9 0.6       Sedimentation Rate No  results for input(s): ESRSEDRATE in the last 72 hours. C-Reactive Protein No results for input(s): CRP in the last 72 hours.  Micro Results: Recent Results (from the past 720 hours)  Urine Culture     Status: None   Collection Time: 04/18/24  7:24 AM   Specimen: Urine  Result Value Ref Range Status   MICRO NUMBER: [id number]  Final   SPECIMEN QUALITY: Adequate  Final   Sample Source URINE  Final   STATUS: FINAL  Final   Result: No Growth  Final  Resp panel by RT-PCR (RSV, Flu A&B, Covid) Anterior Nasal Swab     Status: None   Collection Time: 04/19/24  2:46 PM   Specimen: Anterior Nasal Swab  Result Value Ref Range Status   SARS Coronavirus 2 by RT PCR NEGATIVE NEGATIVE Final    Comment: (NOTE) SARS-CoV-2 target nucleic acids are NOT DETECTED.  The SARS-CoV-2 RNA is generally detectable in upper respiratory specimens during the acute phase of infection. The lowest concentration of SARS-CoV-2 viral copies this assay can detect is 138 copies/mL. A negative result does not preclude SARS-Cov-2 infection and should not be used as the sole basis for treatment or other patient management decisions. A negative result may occur with  improper specimen collection/handling, submission of specimen other than nasopharyngeal swab, presence of viral mutation(s) within the areas targeted by this assay, and inadequate number of viral copies(<138 copies/mL). A negative result must be combined with clinical observations, patient history, and epidemiological information. The expected result is Negative.  Fact Sheet for Patients:  BloggerCourse.com  Fact Sheet for Healthcare Providers:  SeriousBroker.it  This test is no t yet approved or cleared by the [place]  FDA and  has been authorized for detection and/or diagnosis of SARS-CoV-2 by FDA under an Emergency Use Authorization (EUA). This EUA will remain  in effect (meaning this test can  be used) for the duration of the COVID-19 declaration under Section 564(b)(1) of the Act, 21 U.S.C.section 360bbb-3(b)(1), unless the authorization is terminated  or revoked sooner.       Influenza A by PCR NEGATIVE NEGATIVE Final   Influenza B by PCR NEGATIVE NEGATIVE Final    Comment: (NOTE) The Xpert Xpress SARS-CoV-2/FLU/RSV plus assay is intended as an aid  in the diagnosis of influenza from Nasopharyngeal swab specimens and should not be used as a sole basis for treatment. Nasal washings and aspirates are unacceptable for Xpert Xpress SARS-CoV-2/FLU/RSV testing.  Fact Sheet for Patients: BloggerCourse.com  Fact Sheet for Healthcare Providers: SeriousBroker.it  This test is not yet approved or cleared by the [place]  FDA and has been authorized for detection and/or diagnosis of SARS-CoV-2 by FDA under an Emergency Use Authorization (EUA). This EUA will remain in effect (meaning this test can be used) for the duration of the COVID-19 declaration under Section 564(b)(1) of the Act, 21 U.S.C. section 360bbb-3(b)(1), unless the authorization is terminated or revoked.     Resp Syncytial Virus by PCR NEGATIVE NEGATIVE Final    Comment: (NOTE) Fact Sheet for Patients: BloggerCourse.com  Fact Sheet for Healthcare Providers: SeriousBroker.it  This test is not yet approved or cleared by the [place]  FDA and has been authorized for detection and/or diagnosis of SARS-CoV-2 by FDA under an Emergency Use Authorization (EUA). This EUA will remain in effect (meaning this test can be used) for the duration of the COVID-19 declaration under Section 564(b)(1) of the Act, 21 U.S.C. section 360bbb-3(b)(1), unless the authorization is terminated or revoked.  Performed at [facility], [address] [place], [place] [address]   MRSA Next Gen by PCR, Nasal     Status:  None   Collection Time: 04/19/24  8:57 PM   Specimen: Nasal Mucosa; Nasal Swab  Result Value Ref Range Status   MRSA by PCR Next Gen NOT DETECTED NOT DETECTED Final    Comment: (NOTE) The GeneXpert MRSA Assay (FDA approved for NASAL specimens only), is one component of a comprehensive MRSA colonization surveillance program. It is not intended to diagnose MRSA infection nor to guide or monitor treatment for MRSA infections. Test performance is not FDA approved in patients less than 2 years old. Performed at [facility], [address], [place], [place] [address]   Culture, blood (Routine X 2) w Reflex to ID Panel     Status: None (Preliminary result)   Collection Time: 04/20/24  9:05 AM   Specimen: BLOOD  Result Value Ref Range Status   Specimen Description BLOOD SITE NOT SPECIFIED  Final   Special Requests   Final    BOTTLES DRAWN AEROBIC ONLY Blood Culture results may not be optimal due to an inadequate volume of blood received in culture bottles   Culture   Final    NO GROWTH 4 DAYS Performed at [facility], [address] [place], [place] [address]    Report Status PENDING  Incomplete  Culture, blood (Routine X 2) w Reflex to ID Panel     Status: None (Preliminary result)   Collection Time: 04/20/24  1:37 PM   Specimen: BLOOD RIGHT ARM  Result Value Ref Range Status   Specimen Description BLOOD RIGHT ARM  Final   Special Requests   Final    BOTTLES DRAWN AEROBIC AND ANAEROBIC Blood Culture results may not be optimal due to an inadequate volume of blood received in culture bottles   Culture   Final    NO GROWTH 4 DAYS Performed at [facility], [address] [place], [place] [address]    Report Status PENDING  Incomplete  Expectorated Sputum Assessment w Gram Stain, Rflx to Resp Cult     Status: None   Collection Time: 04/20/24  8:37 PM   Specimen: Expectorated Sputum  Result Value Ref Range Status   Specimen Description EXPECTORATED SPUTUM  Final    Special Requests NONE  Final   Sputum evaluation   Final    Sputum specimen not acceptable for testing.  Please recollect.   Gram Stain Report Called to,Read Back By and Verified With: RN RONAL RADFORD [phone] @2325  FH Performed at [facility], [address], [place], [place] [address]    Report Status 04/20/2024 FINAL  Final  Expectorated Sputum Assessment w Gram Stain, Rflx to Resp Cult     Status: None   Collection Time: 04/21/24  4:00 AM   Specimen: Expectorated Sputum  Result Value Ref Range Status   Specimen Description EXPECTORATED SPUTUM  Final   Special Requests NONE  Final   Sputum evaluation   Final    THIS SPECIMEN IS ACCEPTABLE FOR SPUTUM CULTURE Performed at [facility], [address], [place], [place] [address]    Report Status 04/21/2024 FINAL  Final  Culture, Respiratory w Gram Stain     Status: None   Collection Time: 04/21/24  4:00 AM  Result Value Ref Range Status   Specimen Description EXPECTORATED SPUTUM  Final   Special Requests NONE Reflexed from [id number]  Final   Gram Stain   Final    FEW WBC PRESENT, PREDOMINANTLY PMN FEW BUDDING YEAST SEEN Performed at [facility], [address] [place], [place] [address]    Culture FEW CANDIDA ALBICANS  Final   Report Status 04/23/2024 FINAL  Final  Respiratory (~20 pathogens) panel by PCR     Status: None   Collection Time: 04/22/24  8:57 AM   Specimen: Nasopharyngeal Swab; Respiratory  Result Value Ref Range Status   Adenovirus NOT DETECTED NOT DETECTED Final   Coronavirus 229E NOT DETECTED NOT DETECTED Final    Comment: (NOTE) The Coronavirus on the Respiratory Panel, DOES NOT test for the novel  Coronavirus (2019 nCoV)    Coronavirus HKU1 NOT DETECTED NOT DETECTED Final   Coronavirus NL63 NOT DETECTED NOT DETECTED Final   Coronavirus OC43 NOT DETECTED NOT DETECTED Final   Metapneumovirus NOT DETECTED NOT DETECTED Final   Rhinovirus / Enterovirus NOT DETECTED NOT DETECTED Final    Influenza A NOT DETECTED NOT DETECTED Final   Influenza B NOT DETECTED NOT DETECTED Final   Parainfluenza Virus 1 NOT DETECTED NOT DETECTED Final   Parainfluenza Virus 2 NOT DETECTED NOT DETECTED Final   Parainfluenza Virus 3 NOT DETECTED NOT DETECTED Final   Parainfluenza Virus 4 NOT DETECTED NOT DETECTED Final   Respiratory Syncytial Virus NOT DETECTED NOT DETECTED Final   Bordetella pertussis NOT DETECTED NOT DETECTED Final   Bordetella Parapertussis NOT DETECTED NOT DETECTED Final   Chlamydophila pneumoniae NOT DETECTED NOT DETECTED Final   Mycoplasma pneumoniae NOT DETECTED NOT DETECTED Final    Comment: Performed at [facility], [address] [place], [place] [address]  C Difficile Quick Screen (NO PCR Reflex)     Status: None   Collection Time: 04/23/24 11:38 AM   Specimen: STOOL  Result Value Ref Range Status   C Diff antigen NEGATIVE NEGATIVE Final   C Diff toxin NEGATIVE NEGATIVE Final   C Diff interpretation No C. difficile detected.  Final    Comment: Performed at [facility], [address], [place], [place] [address]    Studies/Results: CT ABDOMEN PELVIS W CONTRAST Result Date: 04/22/2024 CLINICAL DATA:  Sepsis rule out infection EXAM: CT ABDOMEN AND  PELVIS WITH CONTRAST TECHNIQUE: Multidetector CT imaging of the abdomen and pelvis was performed using the standard protocol following bolus administration of intravenous contrast. RADIATION DOSE REDUCTION: This exam was performed according to the departmental dose-optimization program which includes automated exposure control, adjustment of the mA and/or kV according to patient size and/or use of iterative reconstruction technique. CONTRAST:  75mL OMNIPAQUE  IOHEXOL  350 MG/ML SOLN COMPARISON:  03/29/2022 FINDINGS: Lower chest: Extensive consolidation noted in the right middle lobe and right lower lobe. Less pronounced patchy opacities in the left lower lobe. Findings compatible with pneumonia. Small right pleural  effusion. A Hepatobiliary: No focal hepatic abnormality. Gallbladder unremarkable. Pancreas: No focal abnormality or ductal dilatation. Spleen: No focal abnormality.  Normal size. Adrenals/Urinary Tract: Left adrenal nodule measures 9 mm and is stable since prior study most compatible with adenoma. Right adrenal gland unremarkable. No renal or ureteral stones. No hydronephrosis. No focal renal abnormality. Stomach/Bowel: Scattered colonic diverticulosis. No active diverticulitis. Stomach and small bowel decompressed. No bowel obstruction or inflammatory process. Vascular/Lymphatic: Aortic atherosclerosis. No evidence of aneurysm or adenopathy. Reproductive: No visible focal abnormality. Other: No free fluid or free air. Musculoskeletal: No acute bony abnormality. IMPRESSION: Dense consolidation in the right middle lobe and right lower lobe and to a lesser extent left lower lobe compatible with multifocal pneumonia. Small right pleural effusion. No acute findings in the abdomen or pelvis. Colonic diverticulosis. Aortic atherosclerosis. Electronically Signed   By: Franky Crease M.D.   On: 04/22/2024 20:06      Assessment/Plan:  INTERVAL HISTORY: pt still febrile to 103 yesterday am   Principal Problem:   Pneumonia of right lower lobe due to infectious organism Active Problems:   Acute hypoxic respiratory failure (HCC)   Sepsis (HCC)   A-fib (HCC)   AKI (acute kidney injury) (HCC)   Transaminitis   Hyperglycemia    Brandon Dixon is a 78 y.o. male with polymyalgia rheumatica on chronic prednisone  5 mg/day atrial fibrillation who has been admitted with a nonresolving pneumonia.  He had what he believed to be a viral infection several weeks ago which then worsened and progressed to productive cough with purulent phlegm and eventually high fevers.  Labs are pertinent for leukocytosis of 18,600 which is worsened up to 24,700 yesterday, exam and-itis with elevated AST up to 110 at 1 point but normal  ALT normal bilirubin and alkaline phosphatase. CXR on July 1st had shown R lower lobe pneumonia as had CT abdomen pelvis which shows dense consolidation in the right middle lobe.  He also has small right-sided pleural effusion.  The patient has failed to respond to antibacterial antibiotics which have been given so far including ceftriaxone  intramuscularly oral Augmentin  for a few doses followed by ceftriaxone  and azithromycin  and more recently Zosyn  with then Zyvox  having been added yesterday.  Is immunocompromised on prednisone  and does have a history of travel in the [employer] which would put him at risk for having acquired tuberculosis and I think we need to rule him out for tuberculosis with at least 3 AFB sputum for culture that are smear negative but with my preference that he also have a bronchial alveolar lavage.  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  When I asked him if he had already had pulmonary symptoms at that time he says that he believes he was actually feeling well at the time that the cabs were born.  This raises of course the  possibility of a zoonotic infection in particular Brucella abortus  in particular  This organism is a biohazardous one for the LAB and so I have informed them that this patent could have brucella.  I am sending Brucella antibodies, Q fevers antibodies as well given exposure though clinically less likely to cause pneumonia.Legionella ag is still pending  I will send serum coccal antigen to ensure that we have also sent urine for Blastomyces antigen and histoplasma antigen  I will continue the zosyn  and zyvox  for now  I think the patient clearly needs a bronchoscopy with BAL and potentially TB biopsy if there is a target  Brucella is difficult to grow but can be grown per micro dept. We could consider PCRs as well  I would not move to empirically treat at this point but if we did would go with doxy  + gent vs doxy + rifampin.    I have personally spent involved in face-to-face and non-face-to-face activities for this patient on the day of the visit. Professional time spent includes the following activities: Preparing to see the patient (review of tests), Obtaining and/or reviewing separately obtained history (admission/discharge record), Performing a medically appropriate examination and/or evaluation , Ordering medications/tests/procedures, referring and communicating with other health care professionals, Documenting clinical information in the EMR, Independently interpreting results (not separately reported), Communicating results to the patient/family/caregiver, Counseling and educating the patient/family/caregiver and Care coordination (not separately reported).   Evaluation of the patient requires complex antimicrobial therapy evaluation, counseling , isolation needs to reduce disease transmission and ris assessment and mitigation.      LOS: 5 days   Jomarie Fleeta Rothman 04/24/2024, 11:28 AM

## 2024-04-24 NOTE — Anesthesia Preprocedure Evaluation (Addendum)
 Anesthesia Evaluation  Patient identified by MRN, date of birth, ID band Patient awake    Reviewed: Allergy & Precautions, NPO status , Patient's Chart, lab work & pertinent test results  Airway Mallampati: II  TM Distance: >3 FB Neck ROM: Full    Dental  (+) Dental Advisory Given, Missing   Pulmonary former smoker   breath sounds clear to auscultation + decreased breath sounds      Cardiovascular + Peripheral Vascular Disease  Normal cardiovascular exam Rhythm:Regular Rate:Normal  Echo 04/20/2024  1. Left ventricular ejection fraction, by estimation, is 55 to 60%. The left ventricle has normal function. The left ventricle has no regional wall motion abnormalities. Left ventricular diastolic parameters were normal.   2. Right ventricular systolic function is normal. The right ventricular size is normal. There is moderately elevated pulmonary artery systolic pressure.   3. Left atrial size was mildly dilated.   4. The mitral valve is grossly normal. Trivial mitral valve regurgitation. No evidence of mitral stenosis.   5. The aortic valve is grossly normal. Aortic valve regurgitation is not visualized. No aortic stenosis is present.   6. The inferior vena cava is normal in size with greater than 50% respiratory variability, suggesting right atrial pressure of 3 mmHg.   Comparison(s): No prior Echocardiogram.   Conclusion(s)/Recommendation(s): Normal biventricular function without evidence of hemodynamically significant valvular heart disease.     Neuro/Psych negative neurological ROS     GI/Hepatic Neg liver ROS,GERD  ,,  Endo/Other  negative endocrine ROS    Renal/GU Renal disease     Musculoskeletal negative musculoskeletal ROS (+)    Abdominal   Peds  Hematology negative hematology ROS (+)   Anesthesia Other Findings   Reproductive/Obstetrics                              Anesthesia  Physical Anesthesia Plan  ASA: 3  Anesthesia Plan: General   Post-op Pain Management: Minimal or no pain anticipated   Induction: Intravenous  PONV Risk Score and Plan: 2 and TIVA, Propofol  infusion and Treatment may vary due to age or medical condition  Airway Management Planned: Oral ETT  Additional Equipment:   Intra-op Plan:   Post-operative Plan: Extubation in OR  Informed Consent: I have reviewed the patients History and Physical, chart, labs and discussed the procedure including the risks, benefits and alternatives for the proposed anesthesia with the patient or authorized representative who has indicated his/her understanding and acceptance.     Dental advisory given  Plan Discussed with: CRNA  Anesthesia Plan Comments:          Anesthesia Quick Evaluation

## 2024-04-24 NOTE — Plan of Care (Signed)

## 2024-04-24 NOTE — Consult Note (Addendum)
 NAME:  Brandon Dixon, MRN:  [id number], DOB:  [birth year], LOS: 5 ADMISSION DATE:  04/19/2024, CONSULTATION DATE:  04/24/2024  REFERRING MD:  Caleen, TRH, CHIEF COMPLAINT: Pneumonia  History of Present Illness:  78 year old man presented to PCP on 6/30 with cough yellow sputum.  He was treated with IM ceftriaxone  given 10-day course of Augmentin . On follow-up visit 7/2 he was noted to be hypoxic, not able to tolerate Augmentin  due to GI upset and was sent to the ED. ED course -febrile 100.6, leukocytosis 18K , CT angiogram chest showed large area of consolidation involving right middle and lower lobes, small right effusion.  Ultrasound chest on 7/4 only showed trace effusion CT abdomen/pelvis on 7/5 showed consolidation extensively in the right middle and lower lobes, minimal left lower lobe with small right effusion. He had persistent fever and ID was consulted.  Antibiotics were changed to Zosyn  and linezolid  to cover Pseudomonas and MRSA.  Of note RVP and MRSA PCR was negative results of blood cultures. Last fever of 103 is noted on 7/6.  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 rate 20, height 6' (1.829 m), weight 77.2 kg, SpO2 99%.        Intake/Output Summary (Last 24 hours) at 04/24/2024 0959 Last data filed at 04/24/2024 0836 Gross per 24 hour  Intake 923.77 ml  Output --  Net 923.77 ml   Filed Weights   04/19/24 2050 04/23/24 0643   Weight: 77.9 kg 77.2 kg    Examination: Gen. Pleasant, well-nourished, in no distress, normal affect ENT - no pallor,icterus, no post nasal drip Neck: No JVD, no thyromegaly, no carotid bruits Lungs: no use of accessory muscles, no dullness to percussion, decreased breath sounds on right without rales or rhonchi  Cardiovascular: Rhythm regular, heart sounds  normal, no murmurs or gallops, no peripheral edema Abdomen: soft and non-tender, no hepatosplenomegaly, BS normal. Musculoskeletal: No deformities, no cyanosis or clubbing Neuro:  alert, non focal   Labs show mild hypokalemia, improved renal function 20/1.1, persistent leukocytosis  Resolved problem list   Assessment and Plan   Severe community-acquired pneumonia Multifocal pneumonia -No organism isolated but clearly appears bacterial given progression on imaging.  He has finally defervesced and repeat CT abdomen also shows improved aeration tracing clinical improvement but he has persistent leukocytosis.  There is no evidence of empyema.  Due to chronic low-dose prednisone , concern for uncommon organism.  Urine strep antigen negative  -ID has recommended obtaining bronchoscopy with cultures.  He is coughing enough sputum and I would suggest that we proceed with obtaining sputum for testing - We can plan for bronchoscopy if he does not improve over next 48 hours based on endoscopy availability. Risks of the procedure including coughing, bleeding, lump in Cherina requiring chest tube were discussed with the patient in detail and evidence understanding - Tentatively scheduled for 10 AM tomorrow, n.p.o. after midnight   Best Practice (right click and Reselect all  SmartList Selections daily)    Code Status:  full code Last date of multidisciplinary goals of care discussion [NA]  Labs   CBC: Recent Labs  Lab 04/20/24 0205 04/21/24 0336 04/22/24 0632 04/23/24 0245 04/24/24 0242  WBC 15.9* 21.1* 22.7* 24.7* 24.3*  HGB  13.3 13.4 13.5 12.6* 12.6*  HCT 39.5 39.9 39.0 36.0* 36.5*  MCV 86.4 88.1 85.0 84.1 83.7  PLT 296 244 273 291 376    Basic Metabolic Panel: Recent Labs  Lab 04/20/24 0205 04/21/24 0853 04/22/24 0632 04/23/24 0245 04/24/24 0242  NA 135 134* 134* 135 136  K 4.0 4.0 3.7 3.6 3.5  CL 100 100 98 100 100  CO2 21* 21* 23 25 26   GLUCOSE 195* 120* 115* 96 124*  BUN 22 29* 28* 24* 20  CREATININE 1.42* 1.16 1.23 1.17 1.07  CALCIUM  8.3* 8.3* 8.2* 7.7* 8.0*  MG 2.2 2.5* 2.3 2.3 2.3  PHOS 3.6 3.6  --   --   --    GFR: Estimated Creatinine Clearance: 62.1 mL/min (by C-G formula based on SCr of 1.07 mg/dL). Recent Labs  Lab 04/20/24 0205 04/20/24 0905 04/21/24 0336 04/22/24 0632 04/23/24 0245 04/24/24 0242  PROCALCITON 6.79  --   --  4.89  --   --   WBC 15.9*  --  21.1* 22.7* 24.7* 24.3*  LATICACIDVEN  --  3.1*  --   --   --   --     Liver Function Tests: Recent Labs  Lab 04/20/24 0446 04/21/24 0853 04/22/24 0632 04/23/24 0245 04/24/24 0242  AST 86* 89* 77* 73* 110*  ALT 37 37 33 31 40  ALKPHOS 149* 148* 134* 126 139*  BILITOT 0.7 0.9 0.5 0.9 0.6  PROT 6.5 6.3* 6.2* 5.7* 5.9*  ALBUMIN  2.1* 1.9* 1.8* 1.7* 1.7*   No results for input(s): LIPASE, AMYLASE in the last 168 hours. No results for input(s): AMMONIA in the last 168 hours.  ABG    Component Value Date/Time   HCO3 23.7 04/19/2024 1536   TCO2 25 04/19/2024 1536   ACIDBASEDEF 1.0 04/19/2024 1536   O2SAT 32 04/19/2024 1536     Coagulation Profile: No results for input(s): INR, PROTIME in the last 168 hours.  Cardiac Enzymes: Recent Labs  Lab 04/19/24 1530  CKTOTAL 189    HbA1C: Hgb A1c MFr Bld  Date/Time Value Ref Range Status  04/20/2024 02:05 AM 6.6 (H) 4.8 - 5.6 % Final    Comment:    (NOTE)         Prediabetes: 5.7 - 6.4         Diabetes: >6.4         Glycemic control for adults with diabetes: <7.0   12/15/2023 10:39 AM 6.4 4.6 - 6.5 % Final    Comment:    Glycemic Control  Guidelines for People with Diabetes:Non Diabetic:  <6%Goal of Therapy: <7%Additional Action Suggested:  >8%     CBG: Recent Labs  Lab 04/23/24 0614 04/23/24 1138 04/23/24 1604 04/23/24 2114 04/24/24 0605  GLUCAP 91 170* 138* 119* 119*    Review of Systems:    Positive for fevers, cough with minimal yellow sputum right-sided chest pain dyspnea on exertion  Constitutional: negative for anorexia, fevers and sweats  Eyes: negative for irritation, redness and visual disturbance  Ears, nose, mouth, throat, and face: negative for earaches, epistaxis, nasal congestion and sore throat  Cardiovascular: negative for  lower extremity edema, orthopnea, palpitations and syncope  Gastrointestinal: negative for abdominal pain,  constipation, diarrhea, melena, nausea and vomiting  Genitourinary:negative for dysuria, frequency and hematuria  Hematologic/lymphatic: negative for bleeding, easy bruising and lymphadenopathy  Musculoskeletal:negative for arthralgias, muscle weakness and stiff joints  Neurological: negative for coordination problems, gait problems, headaches and weakness  Endocrine: negative for diabetic symptoms including polydipsia, polyuria and weight loss   Past Medical History:  He,  has a past medical history of ALLERGIC RHINITIS (09/26/2007), BENIGN PROSTATIC HYPERTROPHY (05/27/2007), BPH with urinary obstruction, COLONIC POLYPS, HX OF (05/27/2007), DERMATITIS (08/25/2010), GERD (05/27/2007), HYPERCHOLESTEROLEMIA (09/26/2007), and Peripheral vascular disease (HCC).   Surgical History:   Past Surgical History:  Procedure Laterality Date   CYSTOSCOPY  11/10/2022   per [facility], Holmium laser enucleation with morcellation   LOWER EXTREMITY ANGIOGRAM Left 04/30/2014   Procedure: LOWER EXTREMITY ANGIOGRAM;  Surgeon: Lonni GORMAN Blade, MD;  Location: [facility] CATH LAB;  Service: Cardiovascular;  Laterality: Left;   ROTATOR CUFF REPAIR     VASECTOMY       Social History:    reports that he quit smoking about 22 years ago. His smoking use included cigarettes. He has never used smokeless tobacco. He reports current alcohol use of about 6.0 standard drinks of alcohol per week. He reports that he does not use drugs.   Family History:  His family history includes Cancer in his father; Diabetes in his brother, brother, brother, brother, brother, and daughter; Heart attack in his mother; Heart disease in his mother; Hypertension in his sister and sister.   Allergies Allergies  Allergen Reactions   Augmentin  [Amoxicillin -Pot Clavulanate] Diarrhea    Adverse reaction with diarrhea, decreased PO intake      Home Medications  Prior to Admission medications   Medication Sig Start Date End Date Taking? Authorizing Provider  amoxicillin -clavulanate (AUGMENTIN ) 875-125 MG tablet Take 1 tablet by mouth 2 (two) times daily. 04/17/24  Yes Johnny Garnette LABOR, MD  aspirin  EC 81 MG tablet Take 81 mg by mouth daily.   Yes [provider]  atorvastatin  (LIPITOR) 80 MG tablet TAKE 1 TABLET EVERY EVENING 01/27/24  Yes Nafziger, Darleene, NP  HYDROcodone  bit-homatropine (HYCODAN) 5-1.5 MG/5ML syrup Take 5 mLs by mouth every 4 (four) hours as needed. Patient taking differently: Take 5 mLs by mouth every 4 (four) hours as needed for cough. 04/17/24  Yes Johnny Garnette LABOR, MD  hydrocortisone ointment 0.5 % Apply 1 application topically daily as needed for itching.   Yes [provider]  metFORMIN  (GLUCOPHAGE -XR) 500 MG 24 hr tablet Take 1 tablet by mouth once daily with breakfast 03/17/24  Yes Nafziger, Darleene, NP  Multiple Vitamin (MULTI-VITAMIN) tablet Take 1 tablet by mouth daily.   Yes [provider]  olmesartan  (BENICAR ) 5 MG tablet TAKE 1 TABLET EVERY DAY 11/05/23  Yes Nafziger, Darleene, NP  predniSONE  (DELTASONE ) 5 MG tablet Take 1 tablet (5 mg total) by mouth daily with breakfast. 11/12/23  Yes Nafziger, Darleene, NP  tadalafil  (CIALIS ) 20 MG tablet Take 0.5-1 tablets (10-20 mg  total) by mouth every other day as needed for erectile dysfunction. 12/15/23  Yes Nafziger, Darleene, NP      Harden Staff MD. DEBE.  Pulmonary & Critical care Pager : [phone]  If no response to pager , please call [phone] until 7 pm After 7:00 pm call Elink  [phone]   04/24/2024

## 2024-04-24 NOTE — Plan of Care (Signed)

## 2024-04-24 NOTE — Progress Notes (Signed)
 PROGRESS NOTE    Brandon Dixon  FMW:[id number] DOB: 13-Feb-1946 DOA: 04/19/2024 PCP: Merna Huxley, NP    Brief Narrative:  78 y.o. male with hx of PAD, PMR on chronic prednisone , hypertension, hyperlipidemia, prediabetes, BPH, GERD, who is transferred from [facility] ED for community-acquired pneumonia with acute hypoxic respiratory failure.  Ongoing for almost 2 weeks, seen by outpatient PCP on 6/30 and was given IM Rocephin  followed by 10 days of Augmentin .  Augmentin  caused GI upset but went to go see PCP again were noted to be hypoxic therefore sent to the ER.  In the ER noted to have right lower lobe near complete consolidation on the CTA chest.  Started on IV Rocephin  and azithromycin .  Initially also in A-fib with RVR, new onset therefore started on p.o. metoprolol . Hospital course also complicated by fluid overload requiring IV Lasix .  Slowly he was weaned off oxygen down to room air.   Assessment & Plan:  Principal Problem:   CAP (community acquired pneumonia) Active Problems:   Acute hypoxic respiratory failure (HCC)   Sepsis (HCC)   A-fib (HCC)   AKI (acute kidney injury) (HCC)   Transaminitis   Hyperglycemia     Community-acquired pneumonia Acute hypoxic respiratory failure, secondary to above, on 8L  Sepsis, secondary to above, present on admission  Pleural effusion Failed outpatient treatment with Rocephin /Augmentin .   CTA shows near right lower lobe complete consolidation and his hypoxic.  Initially worsening oxygen requirement but improved after diuretics.  Due to persistent leukocytosis, CT chest abdomen pelvis performed which does not show any acute pathology.  IR attempted thoracentesis but minimal effusion.  MRSA swab is negative, completed azithromycin .  On IV Zosyn , linezolid  added. -RVP, COVID/flu/RSV are negative.  C. difficile-negative -Urine Legionella and strep-pending -Blastomyces/histoplasma-pending, AFB smears - Blood cultures remain  negative - ID following - Pulmonary consulted for BAL  Atrial fibrillation, new onset, initially RVR now, resolved He is now back in normal sinus rhythm.  Echo unremarkable.  TSH normal.  Hesitant to start any anticoagulation at this time.  Can consider outpatient ambulatory monitor  Acute congestive heart failure with preserved ejection fraction - Now euvolemic.  Echo  shows preserved ef, received lasix .    Acute kidney injury stage I, resolved Baseline creatinine 0.8, elevated 1.4 on admission.  Creatinine now stable around 1.2   Acute liver injury, mixed pattern AST 110, alk phos 202, other LFT normal.  May be related to underlying systemic illness. - Trend LFT, trending down  Essential hypertension - Benicar  on hold due to mild AKI, IV as needed   History of prediabetes Hyperglycemia Chronic steroid use Hyperlipidemia Home regimen metformin  Accu-Cheks and sliding scale - A1c 6.6   Hyponatremia, mild, resolved Resolved and stable  Polymyalgia rheumatica - On prednisone  5 mg daily   Incidental findings: 8 mm nodule in the lingula: Outpatient surveillance imaging   PT/OT  DVT prophylaxis: enoxaparin  (LOVENOX ) injection 40 mg Start: 04/20/24 1000    Code Status: Full Code Family Communication: Family at bedside   Subjective: Still had fever yesterday Overall feels very weak but denies any overt shortness of breath  Examination:  General exam: Appears calm and comfortable  Respiratory system: Some rhonchi especially on the right side Cardiovascular system: S1 & S2 heard, RRR. No JVD, murmurs, rubs, gallops or clicks. No pedal edema. Gastrointestinal system: Abdomen is nondistended, soft and nontender. No organomegaly or masses felt. Normal bowel sounds heard. Central nervous system: Alert and oriented. No focal  neurological deficits. Extremities: Symmetric 5 x 5 power. Skin: No rashes, lesions or ulcers Psychiatry: Judgement and insight appear normal. Mood &  affect appropriate.                Diet Orders (From admission, onward)     Start     Ordered   04/24/24 0859  Diet regular Room service appropriate? Yes; Fluid consistency: Thin  Diet effective now       Question Answer Comment  Room service appropriate? Yes   Fluid consistency: Thin      04/24/24 0859            Objective: Vitals:   04/23/24 2344 04/24/24 0322 04/24/24 0718 04/24/24 0734  BP: 136/69 139/74  (!) 140/76  Pulse: 89 89  90  Resp: 14 18  20   Temp: 99.4 F (37.4 C) 99.2 F (37.3 C)  98.8 F (37.1 C)  TempSrc: Oral Oral  Oral  SpO2: 94% 94% 97% 99%  Weight:      Height:        Intake/Output Summary (Last 24 hours) at 04/24/2024 1040 Last data filed at 04/24/2024 0836 Gross per 24 hour  Intake 923.77 ml  Output --  Net 923.77 ml   Filed Weights   04/19/24 2050 04/23/24 0643  Weight: 77.9 kg 77.2 kg    Scheduled Meds:  aspirin  EC  81 mg Oral Daily   atorvastatin   80 mg Oral QPM   enoxaparin  (LOVENOX ) injection  40 mg Subcutaneous Q24H   feeding supplement  1 Container Oral TID BM   guaiFENesin   1,200 mg Oral BID   insulin  aspart  0-6 Units Subcutaneous TID WC   metoprolol  tartrate  25 mg Oral BID   predniSONE   5 mg Oral Q breakfast   sodium chloride  flush  3 mL Intravenous Q12H   Continuous Infusions:  linezolid  (ZYVOX ) IV Stopped (04/23/24 2247)   piperacillin -tazobactam (ZOSYN )  IV 3.375 g (04/24/24 0151)    Nutritional status     Body mass index is 23.08 kg/m.  Data Reviewed:   CBC: Recent Labs  Lab 04/20/24 0205 04/21/24 0336 04/22/24 0632 04/23/24 0245 04/24/24 0242  WBC 15.9* 21.1* 22.7* 24.7* 24.3*  HGB 13.3 13.4 13.5 12.6* 12.6*  HCT 39.5 39.9 39.0 36.0* 36.5*  MCV 86.4 88.1 85.0 84.1 83.7  PLT 296 244 273 291 376   Basic Metabolic Panel: Recent Labs  Lab 04/20/24 0205 04/21/24 0853 04/22/24 0632 04/23/24 0245 04/24/24 0242  NA 135 134* 134* 135 136  K 4.0 4.0 3.7 3.6 3.5  CL 100 100 98 100 100   CO2 21* 21* 23 25 26   GLUCOSE 195* 120* 115* 96 124*  BUN 22 29* 28* 24* 20  CREATININE 1.42* 1.16 1.23 1.17 1.07  CALCIUM  8.3* 8.3* 8.2* 7.7* 8.0*  MG 2.2 2.5* 2.3 2.3 2.3  PHOS 3.6 3.6  --   --   --    GFR: Estimated Creatinine Clearance: 62.1 mL/min (by C-G formula based on SCr of 1.07 mg/dL). Liver Function Tests: Recent Labs  Lab 04/20/24 0446 04/21/24 0853 04/22/24 0632 04/23/24 0245 04/24/24 0242  AST 86* 89* 77* 73* 110*  ALT 37 37 33 31 40  ALKPHOS 149* 148* 134* 126 139*  BILITOT 0.7 0.9 0.5 0.9 0.6  PROT 6.5 6.3* 6.2* 5.7* 5.9*  ALBUMIN  2.1* 1.9* 1.8* 1.7* 1.7*   No results for input(s): LIPASE, AMYLASE in the last 168 hours. No results for input(s): AMMONIA in the last 168  hours. Coagulation Profile: No results for input(s): INR, PROTIME in the last 168 hours. Cardiac Enzymes: Recent Labs  Lab 04/19/24 1530  CKTOTAL 189   BNP (last 3 results) No results for input(s): PROBNP in the last 8760 hours. HbA1C: No results for input(s): HGBA1C in the last 72 hours. CBG: Recent Labs  Lab 04/23/24 0614 04/23/24 1138 04/23/24 1604 04/23/24 2114 04/24/24 0605  GLUCAP 91 170* 138* 119* 119*   Lipid Profile: No results for input(s): CHOL, HDL, LDLCALC, TRIG, CHOLHDL, LDLDIRECT in the last 72 hours. Thyroid  Function Tests: No results for input(s): TSH, T4TOTAL, FREET4, T3FREE, THYROIDAB in the last 72 hours. Anemia Panel: No results for input(s): VITAMINB12, FOLATE, FERRITIN, TIBC, IRON, RETICCTPCT in the last 72 hours. Sepsis Labs: Recent Labs  Lab 04/20/24 0205 04/20/24 0905 04/22/24 0632  PROCALCITON 6.79  --  4.89  LATICACIDVEN  --  3.1*  --     Recent Results (from the past 240 hours)  Urine Culture     Status: None   Collection Time: 04/18/24  7:24 AM   Specimen: Urine  Result Value Ref Range Status   MICRO NUMBER: [id number]  Final   SPECIMEN QUALITY: Adequate  Final   Sample Source URINE   Final   STATUS: FINAL  Final   Result: No Growth  Final  Resp panel by RT-PCR (RSV, Flu A&B, Covid) Anterior Nasal Swab     Status: None   Collection Time: 04/19/24  2:46 PM   Specimen: Anterior Nasal Swab  Result Value Ref Range Status   SARS Coronavirus 2 by RT PCR NEGATIVE NEGATIVE Final    Comment: (NOTE) SARS-CoV-2 target nucleic acids are NOT DETECTED.  The SARS-CoV-2 RNA is generally detectable in upper respiratory specimens during the acute phase of infection. The lowest concentration of SARS-CoV-2 viral copies this assay can detect is 138 copies/mL. A negative result does not preclude SARS-Cov-2 infection and should not be used as the sole basis for treatment or other patient management decisions. A negative result may occur with  improper specimen collection/handling, submission of specimen other than nasopharyngeal swab, presence of viral mutation(s) within the areas targeted by this assay, and inadequate number of viral copies(<138 copies/mL). A negative result must be combined with clinical observations, patient history, and epidemiological information. The expected result is Negative.  Fact Sheet for Patients:  BloggerCourse.com  Fact Sheet for Healthcare Providers:  SeriousBroker.it  This test is no t yet approved or cleared by the [place]  FDA and  has been authorized for detection and/or diagnosis of SARS-CoV-2 by FDA under an Emergency Use Authorization (EUA). This EUA will remain  in effect (meaning this test can be used) for the duration of the COVID-19 declaration under Section 564(b)(1) of the Act, 21 U.S.C.section 360bbb-3(b)(1), unless the authorization is terminated  or revoked sooner.       Influenza A by PCR NEGATIVE NEGATIVE Final   Influenza B by PCR NEGATIVE NEGATIVE Final    Comment: (NOTE) The Xpert Xpress SARS-CoV-2/FLU/RSV plus assay is intended as an aid in the diagnosis of influenza  from Nasopharyngeal swab specimens and should not be used as a sole basis for treatment. Nasal washings and aspirates are unacceptable for Xpert Xpress SARS-CoV-2/FLU/RSV testing.  Fact Sheet for Patients: BloggerCourse.com  Fact Sheet for Healthcare Providers: SeriousBroker.it  This test is not yet approved or cleared by the [place]  FDA and has been authorized for detection and/or diagnosis of SARS-CoV-2 by FDA under an Emergency Use Authorization (EUA). This  EUA will remain in effect (meaning this test can be used) for the duration of the COVID-19 declaration under Section 564(b)(1) of the Act, 21 U.S.C. section 360bbb-3(b)(1), unless the authorization is terminated or revoked.     Resp Syncytial Virus by PCR NEGATIVE NEGATIVE Final    Comment: (NOTE) Fact Sheet for Patients: BloggerCourse.com  Fact Sheet for Healthcare Providers: SeriousBroker.it  This test is not yet approved or cleared by the [place]  FDA and has been authorized for detection and/or diagnosis of SARS-CoV-2 by FDA under an Emergency Use Authorization (EUA). This EUA will remain in effect (meaning this test can be used) for the duration of the COVID-19 declaration under Section 564(b)(1) of the Act, 21 U.S.C. section 360bbb-3(b)(1), unless the authorization is terminated or revoked.  Performed at [facility], [address] [place], [place] [address]   MRSA Next Gen by PCR, Nasal     Status: None   Collection Time: 04/19/24  8:57 PM   Specimen: Nasal Mucosa; Nasal Swab  Result Value Ref Range Status   MRSA by PCR Next Gen NOT DETECTED NOT DETECTED Final    Comment: (NOTE) The GeneXpert MRSA Assay (FDA approved for NASAL specimens only), is one component of a comprehensive MRSA colonization surveillance program. It is not intended to diagnose MRSA infection nor to guide or monitor  treatment for MRSA infections. Test performance is not FDA approved in patients less than 34 years old. Performed at [facility], [address], [place], [place] [address]   Culture, blood (Routine X 2) w Reflex to ID Panel     Status: None (Preliminary result)   Collection Time: 04/20/24  9:05 AM   Specimen: BLOOD  Result Value Ref Range Status   Specimen Description BLOOD SITE NOT SPECIFIED  Final   Special Requests   Final    BOTTLES DRAWN AEROBIC ONLY Blood Culture results may not be optimal due to an inadequate volume of blood received in culture bottles   Culture   Final    NO GROWTH 4 DAYS Performed at [facility], [address] [place], [place] [address]    Report Status PENDING  Incomplete  Culture, blood (Routine X 2) w Reflex to ID Panel     Status: None (Preliminary result)   Collection Time: 04/20/24  1:37 PM   Specimen: BLOOD RIGHT ARM  Result Value Ref Range Status   Specimen Description BLOOD RIGHT ARM  Final   Special Requests   Final    BOTTLES DRAWN AEROBIC AND ANAEROBIC Blood Culture results may not be optimal due to an inadequate volume of blood received in culture bottles   Culture   Final    NO GROWTH 4 DAYS Performed at [facility], [address], [place], [place] [address]    Report Status PENDING  Incomplete  Expectorated Sputum Assessment w Gram Stain, Rflx to Resp Cult     Status: None   Collection Time: 04/20/24  8:37 PM   Specimen: Expectorated Sputum  Result Value Ref Range Status   Specimen Description EXPECTORATED SPUTUM  Final   Special Requests NONE  Final   Sputum evaluation   Final    Sputum specimen not acceptable for testing.  Please recollect.   Gram Stain Report Called to,Read Back By and Verified With: RN RONAL RADFORD [phone] @2325  FH Performed at [facility], [address], [place], [place] [address]    Report Status 04/20/2024 FINAL  Final  Expectorated Sputum Assessment w Gram Stain, Rflx to Resp Cult      Status: None   Collection Time: 04/21/24  4:00 AM   Specimen: Expectorated Sputum  Result Value Ref Range Status   Specimen Description EXPECTORATED SPUTUM  Final   Special Requests NONE  Final   Sputum evaluation   Final    THIS SPECIMEN IS ACCEPTABLE FOR SPUTUM CULTURE Performed at [facility], [address] [place], [place] [address]    Report Status 04/21/2024 FINAL  Final  Culture, Respiratory w Gram Stain     Status: None   Collection Time: 04/21/24  4:00 AM  Result Value Ref Range Status   Specimen Description EXPECTORATED SPUTUM  Final   Special Requests NONE Reflexed from [id number]  Final   Gram Stain   Final    FEW WBC PRESENT, PREDOMINANTLY PMN FEW BUDDING YEAST SEEN Performed at [facility], [address], [place], [place] [address]    Culture FEW CANDIDA ALBICANS  Final   Report Status 04/23/2024 FINAL  Final  Respiratory (~20 pathogens) panel by PCR     Status: None   Collection Time: 04/22/24  8:57 AM   Specimen: Nasopharyngeal Swab; Respiratory  Result Value Ref Range Status   Adenovirus NOT DETECTED NOT DETECTED Final   Coronavirus 229E NOT DETECTED NOT DETECTED Final    Comment: (NOTE) The Coronavirus on the Respiratory Panel, DOES NOT test for the novel  Coronavirus (2019 nCoV)    Coronavirus HKU1 NOT DETECTED NOT DETECTED Final   Coronavirus NL63 NOT DETECTED NOT DETECTED Final   Coronavirus OC43 NOT DETECTED NOT DETECTED Final   Metapneumovirus NOT DETECTED NOT DETECTED Final   Rhinovirus / Enterovirus NOT DETECTED NOT DETECTED Final   Influenza A NOT DETECTED NOT DETECTED Final   Influenza B NOT DETECTED NOT DETECTED Final   Parainfluenza Virus 1 NOT DETECTED NOT DETECTED Final   Parainfluenza Virus 2 NOT DETECTED NOT DETECTED Final   Parainfluenza Virus 3 NOT DETECTED NOT DETECTED Final   Parainfluenza Virus 4 NOT DETECTED NOT DETECTED Final   Respiratory Syncytial Virus NOT DETECTED NOT DETECTED Final   Bordetella pertussis NOT  DETECTED NOT DETECTED Final   Bordetella Parapertussis NOT DETECTED NOT DETECTED Final   Chlamydophila pneumoniae NOT DETECTED NOT DETECTED Final   Mycoplasma pneumoniae NOT DETECTED NOT DETECTED Final    Comment: Performed at [facility], [address], [place], [place] [address]  C Difficile Quick Screen (NO PCR Reflex)     Status: None   Collection Time: 04/23/24 11:38 AM   Specimen: STOOL  Result Value Ref Range Status   C Diff antigen NEGATIVE NEGATIVE Final   C Diff toxin NEGATIVE NEGATIVE Final   C Diff interpretation No C. difficile detected.  Final    Comment: Performed at [facility], [address] [place], [place] [address]         Radiology Studies: CT ABDOMEN PELVIS W CONTRAST Result Date: 04/22/2024 CLINICAL DATA:  Sepsis rule out infection EXAM: CT ABDOMEN AND PELVIS WITH CONTRAST TECHNIQUE: Multidetector CT imaging of the abdomen and pelvis was performed using the standard protocol following bolus administration of intravenous contrast. RADIATION DOSE REDUCTION: This exam was performed according to the departmental dose-optimization program which includes automated exposure control, adjustment of the mA and/or kV according to patient size and/or use of iterative reconstruction technique. CONTRAST:  75mL OMNIPAQUE  IOHEXOL  350 MG/ML SOLN COMPARISON:  03/29/2022 FINDINGS: Lower chest:  Extensive consolidation noted in the right middle lobe and right lower lobe. Less pronounced patchy opacities in the left lower lobe. Findings compatible with pneumonia. Small right pleural effusion. A Hepatobiliary: No focal hepatic abnormality. Gallbladder unremarkable. Pancreas: No focal abnormality or ductal dilatation. Spleen: No focal abnormality.  Normal size. Adrenals/Urinary Tract: Left adrenal nodule measures 9 mm and is stable since prior study most compatible with adenoma. Right adrenal gland unremarkable. No renal or ureteral stones. No hydronephrosis. No focal renal  abnormality. Stomach/Bowel: Scattered colonic diverticulosis. No active diverticulitis. Stomach and small bowel decompressed. No bowel obstruction or inflammatory process. Vascular/Lymphatic: Aortic atherosclerosis. No evidence of aneurysm or adenopathy. Reproductive: No visible focal abnormality. Other: No free fluid or free air. Musculoskeletal: No acute bony abnormality. IMPRESSION: Dense consolidation in the right middle lobe and right lower lobe and to a lesser extent left lower lobe compatible with multifocal pneumonia. Small right pleural effusion. No acute findings in the abdomen or pelvis. Colonic diverticulosis. Aortic atherosclerosis. Electronically Signed   By: Franky Crease M.D.   On: 04/22/2024 20:06           LOS: 5 days   Time spent= 35 mins    Burgess JAYSON Dare, MD [facility]  If 7PM-7AM, please contact night-coverage  04/24/2024, 10:40 AM

## 2024-04-24 NOTE — Progress Notes (Signed)
 Mobility Specialist Progress Note;   04/24/24 1525  Mobility  Activity Ambulated with assistance in room  Level of Assistance Standby assist, set-up cues, supervision of patient - no hands on  Assistive Device None  Distance Ambulated (ft) 30 ft  Activity Response Tolerated well  Mobility Referral Yes  Mobility visit 1 Mobility  Mobility Specialist Start Time (ACUTE ONLY) 1525  Mobility Specialist Stop Time (ACUTE ONLY) 1534  Mobility Specialist Time Calculation (min) (ACUTE ONLY) 9 min   Pt in chair upon arrival, agreeable to in room mobility d/t enteric precautions. Required no physical assistance during ambulation. Preformed 2x short bouts in room, after 2 pt became fatigued. SPO2 90-95% on RA, HR up to 96 bpm w/ activity. Pt returned back to chair with all needs met, call bell in reach. Wife present.   Lauraine Erm Mobility Specialist Please contact via SecureChat or [employer] [phone]

## 2024-04-25 ENCOUNTER — Encounter (HOSPITAL_COMMUNITY)
Admission: EM | Disposition: A | Discharge status: Home / Self Care | Payer: Self-pay | Source: Ambulatory Visit | Attending: Internal Medicine

## 2024-04-25 ENCOUNTER — Inpatient Hospital Stay (HOSPITAL_COMMUNITY): Payer: Self-pay | Admitting: Anesthesiology

## 2024-04-25 ENCOUNTER — Encounter (HOSPITAL_COMMUNITY): Payer: Self-pay | Admitting: Internal Medicine

## 2024-04-25 DIAGNOSIS — E7800 Pure hypercholesterolemia, unspecified: Secondary | ICD-10-CM | POA: Diagnosis not present

## 2024-04-25 DIAGNOSIS — J189 Pneumonia, unspecified organism: Secondary | ICD-10-CM | POA: Diagnosis not present

## 2024-04-25 DIAGNOSIS — I4891 Unspecified atrial fibrillation: Secondary | ICD-10-CM | POA: Diagnosis not present

## 2024-04-25 HISTORY — PX: VIDEO BRONCHOSCOPY: SHX5072

## 2024-04-25 LAB — CBC
HCT: 36.2 % — ABNORMAL LOW (ref 39.0–52.0)
Hemoglobin: 12.5 g/dL — ABNORMAL LOW (ref 13.0–17.0)
MCH: 28.9 pg (ref 26.0–34.0)
MCHC: 34.5 g/dL (ref 30.0–36.0)
MCV: 83.6 fL (ref 80.0–100.0)
Platelets: 482 K/uL — ABNORMAL HIGH (ref 150–400)
RBC: 4.33 MIL/uL (ref 4.22–5.81)
RDW: 14.9 % (ref 11.5–15.5)
WBC: 22.5 K/uL — ABNORMAL HIGH (ref 4.0–10.5)
nRBC: 0 % (ref 0.0–0.2)

## 2024-04-25 LAB — GLUCOSE, CAPILLARY
Glucose-Capillary: 100 mg/dL — ABNORMAL HIGH (ref 70–99)
Glucose-Capillary: 136 mg/dL — ABNORMAL HIGH (ref 70–99)
Glucose-Capillary: 280 mg/dL — ABNORMAL HIGH (ref 70–99)
Glucose-Capillary: 295 mg/dL — ABNORMAL HIGH (ref 70–99)
Glucose-Capillary: 310 mg/dL — ABNORMAL HIGH (ref 70–99)

## 2024-04-25 LAB — COMPREHENSIVE METABOLIC PANEL WITH GFR
ALT: 57 U/L — ABNORMAL HIGH (ref 0–44)
AST: 159 U/L — ABNORMAL HIGH (ref 15–41)
Albumin: 1.6 g/dL — ABNORMAL LOW (ref 3.5–5.0)
Alkaline Phosphatase: 137 U/L — ABNORMAL HIGH (ref 38–126)
Anion gap: 10 (ref 5–15)
BUN: 18 mg/dL (ref 8–23)
CO2: 25 mmol/L (ref 22–32)
Calcium: 7.8 mg/dL — ABNORMAL LOW (ref 8.9–10.3)
Chloride: 100 mmol/L (ref 98–111)
Creatinine, Ser: 1.04 mg/dL (ref 0.61–1.24)
GFR, Estimated: >60 mL/min (ref >60)
Glucose, Bld: 117 mg/dL — ABNORMAL HIGH (ref 70–99)
Potassium: 3.8 mmol/L (ref 3.5–5.1)
Sodium: 135 mmol/L (ref 135–145)
Total Bilirubin: 0.7 mg/dL (ref 0.0–1.2)
Total Protein: 5.7 g/dL — ABNORMAL LOW (ref 6.5–8.1)

## 2024-04-25 LAB — LEGIONELLA PNEUMOPHILA SEROGP 1 UR AG: L. pneumophila Serogp 1 Ur Ag: NEGATIVE

## 2024-04-25 LAB — CULTURE, BLOOD (ROUTINE X 2)
Culture: NO GROWTH
Culture: NO GROWTH

## 2024-04-25 LAB — CRYPTOCOCCAL ANTIGEN: Crypto Ag: NEGATIVE

## 2024-04-25 LAB — MAGNESIUM: Magnesium: 2.1 mg/dL (ref 1.7–2.4)

## 2024-04-25 SURGERY — VIDEO BRONCHOSCOPY WITHOUT FLUORO
Anesthesia: General | Laterality: Bilateral

## 2024-04-25 MED ORDER — PROPOFOL 10 MG/ML IV BOLUS
INTRAVENOUS | Status: DC | PRN
  Administered 2024-04-25: 100 mg via INTRAVENOUS
  Administered 2024-04-25: 145 ug/kg/min via INTRAVENOUS

## 2024-04-25 MED ORDER — GUAIFENESIN-DM 100-10 MG/5ML PO SYRP: 5.0000 mL | ORAL_SOLUTION | ORAL | Status: DC | PRN

## 2024-04-25 MED ORDER — ROCURONIUM BROMIDE 10 MG/ML (PF) SYRINGE
PREFILLED_SYRINGE | INTRAVENOUS | Status: DC | PRN
Start: 2024-04-25 — End: 2024-04-25
  Administered 2024-04-25: 15 mg via INTRAVENOUS

## 2024-04-25 MED ORDER — SODIUM CHLORIDE 0.9 % IV SOLN
INTRAVENOUS | Status: DC | PRN
Start: 2024-04-25 — End: 2024-04-25

## 2024-04-25 MED ORDER — ONDANSETRON HCL 4 MG/2ML IJ SOLN
INTRAMUSCULAR | Status: DC | PRN
  Administered 2024-04-25: 4 mg via INTRAVENOUS

## 2024-04-25 MED ORDER — PHENYLEPHRINE 80 MCG/ML (10ML) SYRINGE FOR IV PUSH (FOR BLOOD PRESSURE SUPPORT)
PREFILLED_SYRINGE | INTRAVENOUS | Status: DC | PRN
  Administered 2024-04-25: 80 ug via INTRAVENOUS

## 2024-04-25 MED ORDER — FENTANYL CITRATE (PF) 100 MCG/2ML IJ SOLN
INTRAMUSCULAR | Status: AC
  Filled 2024-04-25: qty 2

## 2024-04-25 MED ORDER — SUGAMMADEX SODIUM 200 MG/2ML IV SOLN
INTRAVENOUS | Status: DC | PRN
  Administered 2024-04-25: 200 mg via INTRAVENOUS

## 2024-04-25 MED ORDER — SUCCINYLCHOLINE CHLORIDE 200 MG/10ML IV SOSY
PREFILLED_SYRINGE | INTRAVENOUS | Status: DC | PRN
  Administered 2024-04-25: 80 mg via INTRAVENOUS

## 2024-04-25 MED ORDER — DEXAMETHASONE SODIUM PHOSPHATE 10 MG/ML IJ SOLN
INTRAMUSCULAR | Status: DC | PRN
Start: 2024-04-25 — End: 2024-04-25
  Administered 2024-04-25: 10 mg via INTRAVENOUS

## 2024-04-25 MED ORDER — BENZONATATE 100 MG PO CAPS
100.0000 mg | ORAL_CAPSULE | Freq: Three times a day (TID) | ORAL | Status: DC | PRN
  Administered 2024-04-25 – 2024-04-26 (×2): 100 mg via ORAL
  Filled 2024-04-25 (×2): qty 1

## 2024-04-25 MED ORDER — LIDOCAINE 2% (20 MG/ML) 5 ML SYRINGE
INTRAMUSCULAR | Status: DC | PRN
  Administered 2024-04-25: 80 mg via INTRAVENOUS

## 2024-04-25 NOTE — Progress Notes (Signed)
 PT Cancellation Note  Patient Details Name: Brandon Dixon MRN: [id number] DOB: 07/23/1946   Cancelled Treatment:    Reason Eval/Treat Not Completed: Patient at procedure or test/unavailable (pt leaving for planned video bronchoscopy (scheduled for 10am) soon.) Will continue efforts per PT plan of care as schedule permits.   Brandon Dixon Brandon Dixon Brandon Dixon 04/25/2024, 9:37 AM

## 2024-04-25 NOTE — Progress Notes (Signed)
 NAME:  Brandon Dixon, MRN:  [id number], DOB:  09-Mar-1946, LOS: 6 ADMISSION DATE:  04/19/2024, CONSULTATION DATE:  04/25/2024  REFERRING MD:  Caleen, TRH, CHIEF COMPLAINT: Pneumonia  History of Present Illness:  78 year old man presented to PCP on 6/30 with cough yellow sputum.  He was treated with IM ceftriaxone  given 10-day course of Augmentin . On follow-up visit 7/2 he was noted to be hypoxic, not able to tolerate Augmentin  due to GI upset and was sent to the ED. ED course -febrile 100.6, leukocytosis 18K , CT angiogram chest showed large area of consolidation involving right middle and lower lobes, small right effusion.  Ultrasound chest on 7/4 only showed trace effusion CT abdomen/pelvis on 7/5 showed consolidation extensively in the right middle and lower lobes, minimal left lower lobe with small right effusion. He had persistent fever and ID was consulted.  Antibiotics were changed to Zosyn  and linezolid  to cover Pseudomonas and MRSA.  Of note RVP and MRSA PCR was negative results of blood cultures. Last fever of 103 is noted on 7/6.  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 rate 20, height 6' (1.829 m), weight 77.2 kg, SpO2 95%.        Intake/Output Summary (Last 24 hours) at 04/25/2024 0954 Last data filed at 04/25/2024 0600 Gross per 24 hour  Intake 1444.17 ml  Output --  Net 1444.17 ml   Filed Weights   04/19/24 2050 04/23/24 0643  Weight:  77.9 kg 77.2 kg    Examination: Gen. Pleasant, well-nourished, in no distress, normal affect ENT - no pallor,icterus, JVD Neck: No JVD, no thyromegaly, no carotid bruits Lungs: No accessory muscle use, decreased breath sounds on right without rales or rhonchi  Cardiovascular: Rhythm regular, heart sounds  normal, no murmurs or gallops, no peripheral edema Abdomen: soft and non-tender, no hepatosplenomegaly, BS normal. Musculoskeletal: No deformities, no cyanosis or clubbing Neuro:  alert, non focal   Labs show mild hypokalemia, improved renal function 18/1.0, mild elevated liver enzymes, mild decrease leukocytosis  Resolved problem list   Assessment and Plan   Severe community-acquired pneumonia Multifocal pneumonia -No organism isolated but clearly appears bacterial given progression on imaging.  He has finally defervesced and repeat CT abdomen also shows improved aeration tracing clinical improvement but he has persistent leukocytosis.  There is no evidence of empyema.  Due to chronic low-dose prednisone , concern for uncommon organism.  Urine strep antigen negative  - Proceed with bronchoscopy today Risks of the procedure including coughing, bleeding, lung puncture requiring chest tube were discussed with the patient , wife and daughter in detail and evidence understanding - Continue Zosyn , linezolid  can be stopped in my opinion since MRSA PCR negative - Await urine Legionella   Best Practice (right click and Reselect all SmartList Selections daily)    Code Status:  full code Last date of multidisciplinary goals of care discussion [NA]  Labs   CBC: Recent Labs  Lab 04/21/24 0336 04/22/24 9367 04/23/24  0245 04/24/24 0242 04/25/24 0316  WBC 21.1* 22.7* 24.7* 24.3* 22.5*  HGB 13.4 13.5 12.6* 12.6* 12.5*  HCT 39.9 39.0 36.0* 36.5* 36.2*  MCV 88.1 85.0 84.1 83.7 83.6  PLT 244 273 291 376 482*    Basic Metabolic Panel: Recent Labs  Lab 04/20/24 0205 04/21/24 0853  04/22/24 0632 04/23/24 0245 04/24/24 0242 04/25/24 0316  NA 135 134* 134* 135 136 135  K 4.0 4.0 3.7 3.6 3.5 3.8  CL 100 100 98 100 100 100  CO2 21* 21* 23 25 26 25   GLUCOSE 195* 120* 115* 96 124* 117*  BUN 22 29* 28* 24* 20 18  CREATININE 1.42* 1.16 1.23 1.17 1.07 1.04  CALCIUM  8.3* 8.3* 8.2* 7.7* 8.0* 7.8*  MG 2.2 2.5* 2.3 2.3 2.3 2.1  PHOS 3.6 3.6  --   --   --   --    GFR: Estimated Creatinine Clearance: 63.9 mL/min (by C-G formula based on SCr of 1.04 mg/dL). Recent Labs  Lab 04/20/24 0205 04/20/24 0905 04/21/24 0336 04/22/24 9367 04/23/24 0245 04/24/24 0242 04/25/24 0316  PROCALCITON 6.79  --   --  4.89  --   --   --   WBC 15.9*  --    < > 22.7* 24.7* 24.3* 22.5*  LATICACIDVEN  --  3.1*  --   --   --   --   --    < > = values in this interval not displayed.    Liver Function Tests: Recent Labs  Lab 04/21/24 0853 04/22/24 9367 04/23/24 0245 04/24/24 0242 04/25/24 0316  AST 89* 77* 73* 110* 159*  ALT 37 33 31 40 57*  ALKPHOS 148* 134* 126 139* 137*  BILITOT 0.9 0.5 0.9 0.6 0.7  PROT 6.3* 6.2* 5.7* 5.9* 5.7*  ALBUMIN  1.9* 1.8* 1.7* 1.7* 1.6*   No results for input(s): LIPASE, AMYLASE in the last 168 hours. No results for input(s): AMMONIA in the last 168 hours.  ABG    Component Value Date/Time   HCO3 23.7 04/19/2024 1536   TCO2 25 04/19/2024 1536   ACIDBASEDEF 1.0 04/19/2024 1536   O2SAT 32 04/19/2024 1536     Coagulation Profile: No results for input(s): INR, PROTIME in the last 168 hours.  Cardiac Enzymes: Recent Labs  Lab 04/19/24 1530  CKTOTAL 189    HbA1C: Hgb A1c MFr Bld  Date/Time Value Ref Range Status  04/20/2024 02:05 AM 6.6 (H) 4.8 - 5.6 % Final    Comment:    (NOTE)         Prediabetes: 5.7 - 6.4         Diabetes: >6.4         Glycemic control for adults with diabetes: <7.0   12/15/2023 10:39 AM 6.4 4.6 - 6.5 % Final    Comment:    Glycemic Control Guidelines for People with Diabetes:Non Diabetic:  <6%Goal of  Therapy: <7%Additional Action Suggested:  >8%     CBG: Recent Labs  Lab 04/24/24 0605 04/24/24 1120 04/24/24 1648 04/24/24 2105 04/25/24 0638  GLUCAP 119* 144* 208* 113* 100*    Harden Staff MD. FCCP. [facility] Pager : [phone]  If no response to pager , please call [phone] until 7 pm After 7:00 pm call Elink  [phone]   04/25/2024

## 2024-04-25 NOTE — Progress Notes (Signed)
 PROGRESS NOTE    Brandon Dixon  FMW:[id number] DOB: [birth year] DOA: 04/19/2024 PCP: Merna Huxley, NP    Brief Narrative:  78 y.o. male with hx of PAD, PMR on chronic prednisone , hypertension, hyperlipidemia, prediabetes, BPH, GERD, who is transferred from [facility] ED for community-acquired pneumonia with acute hypoxic respiratory failure.  Ongoing for almost 2 weeks, seen by outpatient PCP on 6/30 and was given IM Rocephin  followed by 10 days of Augmentin .  Augmentin  caused GI upset but went to go see PCP again were noted to be hypoxic therefore sent to the ER.  In the ER noted to have right lower lobe near complete consolidation on the CTA chest.  Started on IV Rocephin  and azithromycin .  Initially also in A-fib with RVR, new onset therefore started on p.o. metoprolol . Hospital course also complicated by fluid overload requiring IV Lasix .  Slowly he was weaned off oxygen down to room air.   Assessment & Plan:  Principal Problem:   CAP (community acquired pneumonia) Active Problems:   Acute hypoxic respiratory failure (HCC)   Sepsis (HCC)   A-fib (HCC)   AKI (acute kidney injury) (HCC)   Transaminitis   Hyperglycemia     Community-acquired pneumonia Acute hypoxic respiratory failure, secondary to above, on 8L  Sepsis, secondary to above, present on admission  Pleural effusion Failed outpatient treatment with Rocephin /Augmentin .   CTA shows near right lower lobe complete consolidation and his hypoxic.  Initially worsening oxygen requirement but improved after diuretics.  Due to persistent leukocytosis, CT chest abdomen pelvis performed which does not show any acute pathology.  IR attempted thoracentesis but minimal effusion.  MRSA swab is negative, completed azithromycin .  On IV Zosyn , linezolid  added. -RVP, COVID/flu/RSV are negative.  C. difficile-negative - Urine strep, cryptococcal antigen-negative - Urine Legionella Blastomyces/histoplasma-pending, AFB smears -  Blood cultures remain negative - ID following - Status post bronchoscopy 7/8, brushings and BAL sent  Atrial fibrillation, new onset, initially RVR now, resolved He is now back in normal sinus rhythm.  Echo unremarkable.  TSH normal.  Hesitant to start any anticoagulation at this time.  Can consider outpatient ambulatory monitor  Acute congestive heart failure with preserved ejection fraction - Now euvolemic.  Echo  shows preserved ef, received lasix .    Acute kidney injury stage I, resolved Baseline creatinine 0.8, elevated 1.4 on admission.  Creatinine now stable around 1.2   Acute liver injury, mixed pattern AST 110, alk phos 202, other LFT normal.  May be related to underlying systemic illness. - Trend LFT, trending down  Essential hypertension - Benicar  on hold due to mild AKI, IV as needed   History of prediabetes Hyperglycemia Chronic steroid use Hyperlipidemia Home regimen metformin  Accu-Cheks and sliding scale - A1c 6.6   Hyponatremia, mild, resolved Resolved and stable  Polymyalgia rheumatica - On prednisone  5 mg daily   Incidental findings: 8 mm nodule in the lingula: Outpatient surveillance imaging   PT/OT  DVT prophylaxis: enoxaparin  (LOVENOX ) injection 40 mg Start: 04/20/24 1000    Code Status: Full Code Family Communication: Family at bedside   Subjective: Seen at bedside, no complaints. Dr. Jude in the room during my evaluation as well helping family understand the procedure  Examination:  General exam: Appears calm and comfortable  Respiratory system: Some rhonchi especially on the right side Cardiovascular system: S1 & S2 heard, RRR. No JVD, murmurs, rubs, gallops or clicks. No pedal edema. Gastrointestinal system: Abdomen is nondistended, soft and nontender. No organomegaly or masses  felt. Normal bowel sounds heard. Central nervous system: Alert and oriented. No focal neurological deficits. Extremities: Symmetric 5 x 5 power. Skin: No rashes,  lesions or ulcers Psychiatry: Judgement and insight appear normal. Mood & affect appropriate.                Diet Orders (From admission, onward)     Start     Ordered   04/25/24 1214  Diet regular Room service appropriate? Yes; Fluid consistency: Thin  Diet effective now       Question Answer Comment  Room service appropriate? Yes   Fluid consistency: Thin      04/25/24 1215            Objective: Vitals:   04/25/24 1120 04/25/24 1129 04/25/24 1135 04/25/24 1152  BP: (!) 142/87  (!) 160/77 (!) 157/86  Pulse: 89  92 89  Resp: 19  17 18   Temp:    98.5 F (36.9 C)  TempSrc:    Oral  SpO2: (!) 89% 92% 90% 91%  Weight:      Height:        Intake/Output Summary (Last 24 hours) at 04/25/2024 1224 Last data filed at 04/25/2024 1105 Gross per 24 hour  Intake 1424.09 ml  Output --  Net 1424.09 ml   Filed Weights   04/19/24 2050 04/23/24 0643 04/25/24 1015  Weight: 77.9 kg 77.2 kg 77.2 kg    Scheduled Meds:  aspirin  EC  81 mg Oral Daily   atorvastatin   80 mg Oral QPM   enoxaparin  (LOVENOX ) injection  40 mg Subcutaneous Q24H   feeding supplement  1 Container Oral TID BM   guaiFENesin   1,200 mg Oral BID   insulin  aspart  0-6 Units Subcutaneous TID WC   metoprolol  tartrate  25 mg Oral BID   predniSONE   5 mg Oral Q breakfast   sodium chloride  flush  3 mL Intravenous Q12H   Continuous Infusions:  linezolid  (ZYVOX ) IV 600 mg (04/25/24 0856)   piperacillin -tazobactam (ZOSYN )  IV 3.375 g (04/25/24 0856)    Nutritional status     Body mass index is 23.08 kg/m.  Data Reviewed:   CBC: Recent Labs  Lab 04/21/24 0336 04/22/24 9367 04/23/24 0245 04/24/24 0242 04/25/24 0316  WBC 21.1* 22.7* 24.7* 24.3* 22.5*  HGB 13.4 13.5 12.6* 12.6* 12.5*  HCT 39.9 39.0 36.0* 36.5* 36.2*  MCV 88.1 85.0 84.1 83.7 83.6  PLT 244 273 291 376 482*   Basic Metabolic Panel: Recent Labs  Lab 04/20/24 0205 04/21/24 0853 04/22/24 0632 04/23/24 0245 04/24/24 0242  04/25/24 0316  NA 135 134* 134* 135 136 135  K 4.0 4.0 3.7 3.6 3.5 3.8  CL 100 100 98 100 100 100  CO2 21* 21* 23 25 26 25   GLUCOSE 195* 120* 115* 96 124* 117*  BUN 22 29* 28* 24* 20 18  CREATININE 1.42* 1.16 1.23 1.17 1.07 1.04  CALCIUM  8.3* 8.3* 8.2* 7.7* 8.0* 7.8*  MG 2.2 2.5* 2.3 2.3 2.3 2.1  PHOS 3.6 3.6  --   --   --   --    GFR: Estimated Creatinine Clearance: 63.9 mL/min (by C-G formula based on SCr of 1.04 mg/dL). Liver Function Tests: Recent Labs  Lab 04/21/24 0853 04/22/24 9367 04/23/24 0245 04/24/24 0242 04/25/24 0316  AST 89* 77* 73* 110* 159*  ALT 37 33 31 40 57*  ALKPHOS 148* 134* 126 139* 137*  BILITOT 0.9 0.5 0.9 0.6 0.7  PROT 6.3* 6.2* 5.7* 5.9* 5.7*  ALBUMIN  1.9* 1.8* 1.7* 1.7* 1.6*   No results for input(s): LIPASE, AMYLASE in the last 168 hours. No results for input(s): AMMONIA in the last 168 hours. Coagulation Profile: No results for input(s): INR, PROTIME in the last 168 hours. Cardiac Enzymes: Recent Labs  Lab 04/19/24 1530  CKTOTAL 189   BNP (last 3 results) No results for input(s): PROBNP in the last 8760 hours. HbA1C: No results for input(s): HGBA1C in the last 72 hours. CBG: Recent Labs  Lab 04/24/24 1120 04/24/24 1648 04/24/24 2105 04/25/24 0638 04/25/24 1155  GLUCAP 144* 208* 113* 100* 136*   Lipid Profile: No results for input(s): CHOL, HDL, LDLCALC, TRIG, CHOLHDL, LDLDIRECT in the last 72 hours. Thyroid  Function Tests: No results for input(s): TSH, T4TOTAL, FREET4, T3FREE, THYROIDAB in the last 72 hours. Anemia Panel: No results for input(s): VITAMINB12, FOLATE, FERRITIN, TIBC, IRON, RETICCTPCT in the last 72 hours. Sepsis Labs: Recent Labs  Lab 04/20/24 0205 04/20/24 0905 04/22/24 0632  PROCALCITON 6.79  --  4.89  LATICACIDVEN  --  3.1*  --     Recent Results (from the past 240 hours)  Urine Culture     Status: None   Collection Time: 04/18/24  7:24 AM   Specimen:  Urine  Result Value Ref Range Status   MICRO NUMBER: [id number]  Final   SPECIMEN QUALITY: Adequate  Final   Sample Source URINE  Final   STATUS: FINAL  Final   Result: No Growth  Final  Resp panel by RT-PCR (RSV, Flu A&B, Covid) Anterior Nasal Swab     Status: None   Collection Time: 04/19/24  2:46 PM   Specimen: Anterior Nasal Swab  Result Value Ref Range Status   SARS Coronavirus 2 by RT PCR NEGATIVE NEGATIVE Final    Comment: (NOTE) SARS-CoV-2 target nucleic acids are NOT DETECTED.  The SARS-CoV-2 RNA is generally detectable in upper respiratory specimens during the acute phase of infection. The lowest concentration of SARS-CoV-2 viral copies this assay can detect is 138 copies/mL. A negative result does not preclude SARS-Cov-2 infection and should not be used as the sole basis for treatment or other patient management decisions. A negative result may occur with  improper specimen collection/handling, submission of specimen other than nasopharyngeal swab, presence of viral mutation(s) within the areas targeted by this assay, and inadequate number of viral copies(<138 copies/mL). A negative result must be combined with clinical observations, patient history, and epidemiological information. The expected result is Negative.  Fact Sheet for Patients:  BloggerCourse.com  Fact Sheet for Healthcare Providers:  SeriousBroker.it  This test is no t yet approved or cleared by the [place]  FDA and  has been authorized for detection and/or diagnosis of SARS-CoV-2 by FDA under an Emergency Use Authorization (EUA). This EUA will remain  in effect (meaning this test can be used) for the duration of the COVID-19 declaration under Section 564(b)(1) of the Act, 21 U.S.C.section 360bbb-3(b)(1), unless the authorization is terminated  or revoked sooner.       Influenza A by PCR NEGATIVE NEGATIVE Final   Influenza B by PCR NEGATIVE  NEGATIVE Final    Comment: (NOTE) The Xpert Xpress SARS-CoV-2/FLU/RSV plus assay is intended as an aid in the diagnosis of influenza from Nasopharyngeal swab specimens and should not be used as a sole basis for treatment. Nasal washings and aspirates are unacceptable for Xpert Xpress SARS-CoV-2/FLU/RSV testing.  Fact Sheet for Patients: BloggerCourse.com  Fact Sheet for Healthcare Providers: SeriousBroker.it  This test is not  yet approved or cleared by the [place]  FDA and has been authorized for detection and/or diagnosis of SARS-CoV-2 by FDA under an Emergency Use Authorization (EUA). This EUA will remain in effect (meaning this test can be used) for the duration of the COVID-19 declaration under Section 564(b)(1) of the Act, 21 U.S.C. section 360bbb-3(b)(1), unless the authorization is terminated or revoked.     Resp Syncytial Virus by PCR NEGATIVE NEGATIVE Final    Comment: (NOTE) Fact Sheet for Patients: BloggerCourse.com  Fact Sheet for Healthcare Providers: SeriousBroker.it  This test is not yet approved or cleared by the [place]  FDA and has been authorized for detection and/or diagnosis of SARS-CoV-2 by FDA under an Emergency Use Authorization (EUA). This EUA will remain in effect (meaning this test can be used) for the duration of the COVID-19 declaration under Section 564(b)(1) of the Act, 21 U.S.C. section 360bbb-3(b)(1), unless the authorization is terminated or revoked.  Performed at [facility], [address] [place], [place] [address]   MRSA Next Gen by PCR, Nasal     Status: None   Collection Time: 04/19/24  8:57 PM   Specimen: Nasal Mucosa; Nasal Swab  Result Value Ref Range Status   MRSA by PCR Next Gen NOT DETECTED NOT DETECTED Final    Comment: (NOTE) The GeneXpert MRSA Assay (FDA approved for NASAL specimens only), is one  component of a comprehensive MRSA colonization surveillance program. It is not intended to diagnose MRSA infection nor to guide or monitor treatment for MRSA infections. Test performance is not FDA approved in patients less than 25 years old. Performed at [facility], [address], [place], [place] [address]   Culture, blood (Routine X 2) w Reflex to ID Panel     Status: None   Collection Time: 04/20/24  9:05 AM   Specimen: BLOOD  Result Value Ref Range Status   Specimen Description BLOOD SITE NOT SPECIFIED  Final   Special Requests   Final    BOTTLES DRAWN AEROBIC ONLY Blood Culture results may not be optimal due to an inadequate volume of blood received in culture bottles   Culture   Final    NO GROWTH 5 DAYS Performed at [facility], [address] [place], [place] [address]    Report Status 04/25/2024 FINAL  Final  Culture, blood (Routine X 2) w Reflex to ID Panel     Status: None   Collection Time: 04/20/24  1:37 PM   Specimen: BLOOD RIGHT ARM  Result Value Ref Range Status   Specimen Description BLOOD RIGHT ARM  Final   Special Requests   Final    BOTTLES DRAWN AEROBIC AND ANAEROBIC Blood Culture results may not be optimal due to an inadequate volume of blood received in culture bottles   Culture   Final    NO GROWTH 5 DAYS Performed at [facility], [address], [place], [place] [address]    Report Status 04/25/2024 FINAL  Final  Expectorated Sputum Assessment w Gram Stain, Rflx to Resp Cult     Status: None   Collection Time: 04/20/24  8:37 PM   Specimen: Expectorated Sputum  Result Value Ref Range Status   Specimen Description EXPECTORATED SPUTUM  Final   Special Requests NONE  Final   Sputum evaluation   Final    Sputum specimen not acceptable for testing.  Please recollect.   Gram Stain Report Called to,Read Back By and Verified With:  RN RONAL RADFORD [phone] @2325  FH Performed at [facility], [address] [place], [place] [address]     Report Status 04/20/2024 FINAL  Final  Expectorated Sputum Assessment w Gram Stain, Rflx to Resp Cult     Status: None   Collection Time: 04/21/24  4:00 AM   Specimen: Expectorated Sputum  Result Value Ref Range Status   Specimen Description EXPECTORATED SPUTUM  Final   Special Requests NONE  Final   Sputum evaluation   Final    THIS SPECIMEN IS ACCEPTABLE FOR SPUTUM CULTURE Performed at [facility], [address] [place], [place] [address]    Report Status 04/21/2024 FINAL  Final  Culture, Respiratory w Gram Stain     Status: None   Collection Time: 04/21/24  4:00 AM  Result Value Ref Range Status   Specimen Description EXPECTORATED SPUTUM  Final   Special Requests NONE Reflexed from [id number]  Final   Gram Stain   Final    FEW WBC PRESENT, PREDOMINANTLY PMN FEW BUDDING YEAST SEEN Performed at [facility], [address], [place], [place] [address]    Culture FEW CANDIDA ALBICANS  Final   Report Status 04/23/2024 FINAL  Final  Respiratory (~20 pathogens) panel by PCR     Status: None   Collection Time: 04/22/24  8:57 AM   Specimen: Nasopharyngeal Swab; Respiratory  Result Value Ref Range Status   Adenovirus NOT DETECTED NOT DETECTED Final   Coronavirus 229E NOT DETECTED NOT DETECTED Final    Comment: (NOTE) The Coronavirus on the Respiratory Panel, DOES NOT test for the novel  Coronavirus (2019 nCoV)    Coronavirus HKU1 NOT DETECTED NOT DETECTED Final   Coronavirus NL63 NOT DETECTED NOT DETECTED Final   Coronavirus OC43 NOT DETECTED NOT DETECTED Final   Metapneumovirus NOT DETECTED NOT DETECTED Final   Rhinovirus / Enterovirus NOT DETECTED NOT DETECTED Final   Influenza A NOT DETECTED NOT DETECTED Final   Influenza B NOT DETECTED NOT DETECTED Final   Parainfluenza Virus 1 NOT DETECTED NOT DETECTED Final   Parainfluenza Virus 2 NOT DETECTED NOT DETECTED Final   Parainfluenza Virus 3 NOT DETECTED NOT DETECTED Final   Parainfluenza Virus 4 NOT DETECTED NOT  DETECTED Final   Respiratory Syncytial Virus NOT DETECTED NOT DETECTED Final   Bordetella pertussis NOT DETECTED NOT DETECTED Final   Bordetella Parapertussis NOT DETECTED NOT DETECTED Final   Chlamydophila pneumoniae NOT DETECTED NOT DETECTED Final   Mycoplasma pneumoniae NOT DETECTED NOT DETECTED Final    Comment: Performed at [facility], [address], [place], [place] [address]  C Difficile Quick Screen (NO PCR Reflex)     Status: None   Collection Time: 04/23/24 11:38 AM   Specimen: STOOL  Result Value Ref Range Status   C Diff antigen NEGATIVE NEGATIVE Final   C Diff toxin NEGATIVE NEGATIVE Final   C Diff interpretation No C. difficile detected.  Final    Comment: Performed at [facility], [address], [place], [place] [address]         Radiology Studies: No results found.         LOS: 6 days   Time spent= 35 mins    Burgess JAYSON Dare, MD [facility]  If 7PM-7AM, please contact night-coverage  04/25/2024, 12:24 PM

## 2024-04-25 NOTE — Progress Notes (Signed)
 Subjective:  Feels slightly better   Antibiotics:  Anti-infectives (From admission, onward)    Start     Dose/Rate Route Frequency Ordered Stop   04/23/24 1230  linezolid  (ZYVOX ) IVPB 600 mg        600 mg 300 mL/hr over 60 Minutes Intravenous Every 12 hours 04/23/24 1130     04/22/24 1000  piperacillin -tazobactam (ZOSYN ) IVPB 3.375 g        3.375 g 12.5 mL/hr over 240 Minutes Intravenous Every 8 hours 04/22/24 0909     04/20/24 1600  azithromycin  (ZITHROMAX ) tablet 500 mg        500 mg Oral Daily 04/19/24 2233 04/21/24 1507   04/20/24 1600  cefTRIAXone  (ROCEPHIN ) 1 g in sodium chloride  0.9 % 100 mL IVPB  Status:  Discontinued        1 g 200 mL/hr over 30 Minutes Intravenous Every 24 hours 04/19/24 2233 04/22/24 0856   04/19/24 1600  cefTRIAXone  (ROCEPHIN ) 1 g in sodium chloride  0.9 % 100 mL IVPB        1 g 200 mL/hr over 30 Minutes Intravenous  Once 04/19/24 1548 04/19/24 1749   04/19/24 1600  azithromycin  (ZITHROMAX ) 500 mg in sodium chloride  0.9 % 250 mL IVPB        500 mg 250 mL/hr over 60 Minutes Intravenous  Once 04/19/24 1548 04/19/24 1808       Medications: Scheduled Meds:  aspirin  EC  81 mg Oral Daily   atorvastatin   80 mg Oral QPM   enoxaparin  (LOVENOX ) injection  40 mg Subcutaneous Q24H   feeding supplement  1 Container Oral TID BM   guaiFENesin   1,200 mg Oral BID   insulin  aspart  0-6 Units Subcutaneous TID WC   metoprolol  tartrate  25 mg Oral BID   predniSONE   5 mg Oral Q breakfast   sodium chloride  flush  3 mL Intravenous Q12H   Continuous Infusions:  linezolid  (ZYVOX ) IV Stopped (04/25/24 0956)   piperacillin -tazobactam (ZOSYN )  IV 3.375 g (04/25/24 1624)   PRN Meds:.acetaminophen , albuterol , benzonatate , hydrALAZINE , melatonin, metoprolol  tartrate, ondansetron  (ZOFRAN ) IV, polyethylene glycol, senna-docusate    Objective: Weight change:   Intake/Output Summary (Last 24 hours) at 04/25/2024 1641 Last data filed at 04/25/2024 1505 Gross  per 24 hour  Intake 1734.79 ml  Output --  Net 1734.79 ml   Blood pressure (!) 144/80, pulse 90, temperature 98 F (36.7 C), temperature source Oral, resp. rate 18, height 6' (1.829 m), weight 77.2 kg, SpO2 96%. Temp:  [97.9 F (36.6 C)-99.2 F (37.3 C)] 98 F (36.7 C) (07/08 1546) Pulse Rate:  [74-94] 90 (07/08 1546) Resp:  [17-22] 18 (07/08 1546) BP: (128-162)/(69-87) 144/80 (07/08 1546) SpO2:  [89 %-97 %] 96 % (07/08 1546) Weight:  [77.2 kg] 77.2 kg (07/08 1015)  Physical Exam: Physical Exam Constitutional:      Appearance: He is well-developed.  HENT:     Head: Normocephalic and atraumatic.  Eyes:     Conjunctiva/sclera: Conjunctivae normal.  Cardiovascular:     Rate and Rhythm: Normal rate and regular rhythm.     Heart sounds: No murmur heard.    Friction rub present. No gallop.  Pulmonary:     Effort: Pulmonary effort is normal. No respiratory distress.     Breath sounds: Normal breath sounds. No wheezing.  Abdominal:     General: There is no distension.     Palpations: Abdomen is soft.  Musculoskeletal:  General: Normal range of motion.     Cervical back: Normal range of motion and neck supple.  Skin:    General: Skin is warm and dry.     Findings: No erythema or rash.  Neurological:     General: No focal deficit present.     Mental Status: He is alert and oriented to person, place, and time.  Psychiatric:        Mood and Affect: Mood normal.        Behavior: Behavior normal.        Thought Content: Thought content normal.        Judgment: Judgment normal.      CBC:    BMET Recent Labs    04/24/24 0242 04/25/24 0316  NA 136 135  K 3.5 3.8  CL 100 100  CO2 26 25  GLUCOSE 124* 117*  BUN 20 18  CREATININE 1.07 1.04  CALCIUM  8.0* 7.8*     Liver Panel  Recent Labs    04/24/24 0242 04/25/24 0316  PROT 5.9* 5.7*  ALBUMIN  1.7* 1.6*  AST 110* 159*  ALT 40 57*  ALKPHOS 139* 137*  BILITOT 0.6 0.7       Sedimentation Rate No  results for input(s): ESRSEDRATE in the last 72 hours. C-Reactive Protein No results for input(s): CRP in the last 72 hours.  Micro Results: Recent Results (from the past 720 hours)  Urine Culture     Status: None   Collection Time: 04/18/24  7:24 AM   Specimen: Urine  Result Value Ref Range Status   MICRO NUMBER: [id number]  Final   SPECIMEN QUALITY: Adequate  Final   Sample Source URINE  Final   STATUS: FINAL  Final   Result: No Growth  Final  Resp panel by RT-PCR (RSV, Flu A&B, Covid) Anterior Nasal Swab     Status: None   Collection Time: 04/19/24  2:46 PM   Specimen: Anterior Nasal Swab  Result Value Ref Range Status   SARS Coronavirus 2 by RT PCR NEGATIVE NEGATIVE Final    Comment: (NOTE) SARS-CoV-2 target nucleic acids are NOT DETECTED.  The SARS-CoV-2 RNA is generally detectable in upper respiratory specimens during the acute phase of infection. The lowest concentration of SARS-CoV-2 viral copies this assay can detect is 138 copies/mL. A negative result does not preclude SARS-Cov-2 infection and should not be used as the sole basis for treatment or other patient management decisions. A negative result may occur with  improper specimen collection/handling, submission of specimen other than nasopharyngeal swab, presence of viral mutation(s) within the areas targeted by this assay, and inadequate number of viral copies(<138 copies/mL). A negative result must be combined with clinical observations, patient history, and epidemiological information. The expected result is Negative.  Fact Sheet for Patients:  BloggerCourse.com  Fact Sheet for Healthcare Providers:  SeriousBroker.it  This test is no t yet approved or cleared by the [place]  FDA and  has been authorized for detection and/or diagnosis of SARS-CoV-2 by FDA under an Emergency Use Authorization (EUA). This EUA will remain  in effect (meaning this test can  be used) for the duration of the COVID-19 declaration under Section 564(b)(1) of the Act, 21 U.S.C.section 360bbb-3(b)(1), unless the authorization is terminated  or revoked sooner.       Influenza A by PCR NEGATIVE NEGATIVE Final   Influenza B by PCR NEGATIVE NEGATIVE Final    Comment: (NOTE) The Xpert Xpress SARS-CoV-2/FLU/RSV plus assay is intended as an aid  in the diagnosis of influenza from Nasopharyngeal swab specimens and should not be used as a sole basis for treatment. Nasal washings and aspirates are unacceptable for Xpert Xpress SARS-CoV-2/FLU/RSV testing.  Fact Sheet for Patients: BloggerCourse.com  Fact Sheet for Healthcare Providers: SeriousBroker.it  This test is not yet approved or cleared by the [place]  FDA and has been authorized for detection and/or diagnosis of SARS-CoV-2 by FDA under an Emergency Use Authorization (EUA). This EUA will remain in effect (meaning this test can be used) for the duration of the COVID-19 declaration under Section 564(b)(1) of the Act, 21 U.S.C. section 360bbb-3(b)(1), unless the authorization is terminated or revoked.     Resp Syncytial Virus by PCR NEGATIVE NEGATIVE Final    Comment: (NOTE) Fact Sheet for Patients: BloggerCourse.com  Fact Sheet for Healthcare Providers: SeriousBroker.it  This test is not yet approved or cleared by the [place]  FDA and has been authorized for detection and/or diagnosis of SARS-CoV-2 by FDA under an Emergency Use Authorization (EUA). This EUA will remain in effect (meaning this test can be used) for the duration of the COVID-19 declaration under Section 564(b)(1) of the Act, 21 U.S.C. section 360bbb-3(b)(1), unless the authorization is terminated or revoked.  Performed at [facility], [address] [place], [place] [address]   MRSA Next Gen by PCR, Nasal     Status:  None   Collection Time: 04/19/24  8:57 PM   Specimen: Nasal Mucosa; Nasal Swab  Result Value Ref Range Status   MRSA by PCR Next Gen NOT DETECTED NOT DETECTED Final    Comment: (NOTE) The GeneXpert MRSA Assay (FDA approved for NASAL specimens only), is one component of a comprehensive MRSA colonization surveillance program. It is not intended to diagnose MRSA infection nor to guide or monitor treatment for MRSA infections. Test performance is not FDA approved in patients less than 89 years old. Performed at [facility], [address], [place], [place] [address]   Culture, blood (Routine X 2) w Reflex to ID Panel     Status: None   Collection Time: 04/20/24  9:05 AM   Specimen: BLOOD  Result Value Ref Range Status   Specimen Description BLOOD SITE NOT SPECIFIED  Final   Special Requests   Final    BOTTLES DRAWN AEROBIC ONLY Blood Culture results may not be optimal due to an inadequate volume of blood received in culture bottles   Culture   Final    NO GROWTH 5 DAYS Performed at [facility], [address] [place], [place] [address]    Report Status 04/25/2024 FINAL  Final  Culture, blood (Routine X 2) w Reflex to ID Panel     Status: None   Collection Time: 04/20/24  1:37 PM   Specimen: BLOOD RIGHT ARM  Result Value Ref Range Status   Specimen Description BLOOD RIGHT ARM  Final   Special Requests   Final    BOTTLES DRAWN AEROBIC AND ANAEROBIC Blood Culture results may not be optimal due to an inadequate volume of blood received in culture bottles   Culture   Final    NO GROWTH 5 DAYS Performed at [facility], [address] [place], [place] [address]    Report Status 04/25/2024 FINAL  Final  Expectorated Sputum Assessment w Gram Stain, Rflx to Resp Cult     Status: None   Collection Time: 04/20/24  8:37 PM   Specimen: Expectorated Sputum  Result Value  Ref Range Status   Specimen Description EXPECTORATED SPUTUM  Final   Special Requests NONE  Final    Sputum evaluation   Final    Sputum specimen not acceptable for testing.  Please recollect.   Gram Stain Report Called to,Read Back By and Verified With: RN RONAL RADFORD [phone] @2325  FH Performed at [facility], [address], [place], [place] [address]    Report Status 04/20/2024 FINAL  Final  Expectorated Sputum Assessment w Gram Stain, Rflx to Resp Cult     Status: None   Collection Time: 04/21/24  4:00 AM   Specimen: Expectorated Sputum  Result Value Ref Range Status   Specimen Description EXPECTORATED SPUTUM  Final   Special Requests NONE  Final   Sputum evaluation   Final    THIS SPECIMEN IS ACCEPTABLE FOR SPUTUM CULTURE Performed at [facility], [address], [place], [place] [address]    Report Status 04/21/2024 FINAL  Final  Culture, Respiratory w Gram Stain     Status: None   Collection Time: 04/21/24  4:00 AM  Result Value Ref Range Status   Specimen Description EXPECTORATED SPUTUM  Final   Special Requests NONE Reflexed from [id number]  Final   Gram Stain   Final    FEW WBC PRESENT, PREDOMINANTLY PMN FEW BUDDING YEAST SEEN Performed at [facility], [address], [place], [place] [address]    Culture FEW CANDIDA ALBICANS  Final   Report Status 04/23/2024 FINAL  Final  Respiratory (~20 pathogens) panel by PCR     Status: None   Collection Time: 04/22/24  8:57 AM   Specimen: Nasopharyngeal Swab; Respiratory  Result Value Ref Range Status   Adenovirus NOT DETECTED NOT DETECTED Final   Coronavirus 229E NOT DETECTED NOT DETECTED Final    Comment: (NOTE) The Coronavirus on the Respiratory Panel, DOES NOT test for the novel  Coronavirus (2019 nCoV)    Coronavirus HKU1 NOT DETECTED NOT DETECTED Final   Coronavirus NL63 NOT DETECTED NOT DETECTED Final   Coronavirus OC43 NOT DETECTED NOT DETECTED Final   Metapneumovirus NOT DETECTED NOT DETECTED Final   Rhinovirus / Enterovirus NOT DETECTED NOT DETECTED Final   Influenza A NOT DETECTED NOT DETECTED  Final   Influenza B NOT DETECTED NOT DETECTED Final   Parainfluenza Virus 1 NOT DETECTED NOT DETECTED Final   Parainfluenza Virus 2 NOT DETECTED NOT DETECTED Final   Parainfluenza Virus 3 NOT DETECTED NOT DETECTED Final   Parainfluenza Virus 4 NOT DETECTED NOT DETECTED Final   Respiratory Syncytial Virus NOT DETECTED NOT DETECTED Final   Bordetella pertussis NOT DETECTED NOT DETECTED Final   Bordetella Parapertussis NOT DETECTED NOT DETECTED Final   Chlamydophila pneumoniae NOT DETECTED NOT DETECTED Final   Mycoplasma pneumoniae NOT DETECTED NOT DETECTED Final    Comment: Performed at [facility], [address], [place], [place] [address]  C Difficile Quick Screen (NO PCR Reflex)     Status: None   Collection Time: 04/23/24 11:38 AM   Specimen: STOOL  Result Value Ref Range Status   C Diff antigen NEGATIVE NEGATIVE Final   C Diff toxin NEGATIVE NEGATIVE Final   C Diff interpretation No C. difficile detected.  Final    Comment: Performed at [facility], [address] [place], [place] [address]  Culture, BAL-quantitative w Gram Stain     Status: None (Preliminary result)   Collection Time: 04/25/24 10:46 AM   Specimen: Bronchial  Alveolar Lavage; Respiratory  Result Value Ref Range Status   Specimen Description BRONCHIAL ALVEOLAR LAVAGE  Final   Special Requests NONE  Final   Gram Stain   Final    NO WBC SEEN NO ORGANISMS SEEN Performed at [facility], [address], [place], [place] [address]    Culture PENDING  Incomplete   Report Status PENDING  Incomplete  Aerobic/Anaerobic Culture w Gram Stain (surgical/deep wound)     Status: None (Preliminary result)   Collection Time: 04/25/24 10:46 AM   Specimen: Bronchial Alveolar Lavage; Respiratory  Result Value Ref Range Status   Specimen Description BRONCHIAL ALVEOLAR LAVAGE  Final   Special Requests NONE  Final   Gram Stain   Final    NO WBC SEEN NO ORGANISMS SEEN Performed at [facility],  [address], [place], [place] [address]    Culture PENDING  Incomplete   Report Status PENDING  Incomplete    Studies/Results: No results found.     Assessment/Plan:  INTERVAL HISTORY:   Patient is status bronchoscopy   Principal Problem:   Pneumonia of right lower lobe due to infectious organism Active Problems:   Acute hypoxic respiratory failure (HCC)   Sepsis (HCC)   A-fib (HCC)   AKI (acute kidney injury) (HCC)   Transaminitis   Hyperglycemia   Leukocytosis    Brandon Dixon is a 78 y.o. male with polymyalgia rheumatica on chronic prednisone  5 mg/day atrial fibrillation who has been admitted with a nonresolving pneumonia.  He had what he believed to be a viral infection several weeks ago which then worsened and progressed to productive cough with purulent phlegm and eventually high fevers.  Labs are pertinent for leukocytosis of 18,600 which is worsened up to 24,700 yesterday, exam and-itis with elevated AST up to 110 at 1 point but normal ALT normal bilirubin and alkaline phosphatase. CXR on July 1st had shown R lower lobe pneumonia as had CT abdomen pelvis which shows dense consolidation in the right middle lobe.  He also has small right-sided pleural effusion.  The patient has failed to respond to antibacterial antibiotics which have been given so far including ceftriaxone  intramuscularly oral Augmentin  for a few doses followed by ceftriaxone  and azithromycin  and more recently Zosyn  with then Zyvox  having been added yesterday.  Is immunocompromised on prednisone  and does have a history of travel in the [employer] which would put him at risk for having acquired tuberculosis and I think we need to rule him out for tuberculosis with at least 3 AFB sputum for culture that are smear negative but with my preference that he also have a bronchial alveolar lavage.  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  When I asked him if  he had already had pulmonary symptoms at that time he says that he believes he was actually feeling well at the time that the cabs were born.  This raises of course the possibility of a zoonotic infection in particular Brucella abortus  in particular  This organism is a biohazardous one for the LAB and so I have informed them that this patent could have brucella.  I have sent Brucella antibodies, Q fevers antibodies as well given exposure though clinically less likely to cause pneumonia.Legionella ag is still pending  Serum cryptococcal antigen was negative, urine histo and blasto ag pending  Legionella ag is negative in urine as was his pneumococcal ag  He has had one sputum for AFB  He is now sp bronchoscopy with Dr. Jude and greatly appreciate his help.  He did encounter a moderate amount of greenish-yellow secretions in the right lower lobe airways which were lavaged and suctioned for culture.  The patients fever has defervesced and WBC down slightly. His transaminitis is worse.  While I think much of his story would line up for Brucella and it is possible the azithromycin  he had originally could have had some activity, I am not yet ready to pull the trigger on empiric treatment  I will ensure he has full screening for viral hepatides and US  RUQ  If we went down that round would be doxy + gent vs rifampin  I still want to ensure that he is ruled out for TB with 3 separate AFB smears negative  Airborne cautions until AFBs are negative x 3 smears   I have personally spent 52 minutes involved in face-to-face and non-face-to-face activities for this patient on the day of the visit. Professional time spent includes the following activities: Preparing to see the patient (review of tests), Obtaining and/or reviewing separately obtained history (admission/discharge record), Performing a medically appropriate examination and/or evaluation , Ordering medications/tests/procedures, referring  and communicating with other health care professionals, Documenting clinical information in the EMR, Independently interpreting results (not separately reported), Communicating results to the patient/family/caregiver, Counseling and educating the patient/family/caregiver and Care coordination (not separately reported).   Evaluation of the patient requires complex antimicrobial therapy evaluation, counseling , isolation needs to reduce disease transmission and risk assessment and mitigation.       LOS: 6 days   Brandon Dixon 04/25/2024, 4:41 PM

## 2024-04-25 NOTE — Progress Notes (Signed)
 Occupational Therapy Treatment Patient Details Name: Brandon Dixon MRN: [id number] DOB: 05/16/1946 Today's Date: 04/25/2024   History of present illness Pt is a 78 year old man admitted with CAP, sepsis and respiratory failure with hypoxia on 04/19/24. Hospital course complicated by new onset afib with RVR. PMH: PAD, PMR on chronic prednisone , HTN, HLD, prediabetes, BPH, GERD, B rotator cuff repairs.   OT comments  Pt c/o of SOB, still tired from procedure earlier but agreeable for OOB activities. Pt mod I for bed mobility, stands unassisted without AD. Pt able to ambulate around room 50 feet on RA, no LOB, no dizziness, Pt tolerated well, O2 on RA around 85-89%, did not improve above 89% on RA, quickly improves to 95% or higher on 3L O2. Pt able to complete ADLs sitting or standing at bedside with supervision, increased time. Pt educated on energy conservation, taking frequent rest breaks in between tasks, sitting when possible to complete ADLs. Will continue to see Pt acutely to progress as able with activity tolerance and strength, no OT follow up expected.       If plan is discharge home, recommend the following:  A little help with walking and/or transfers;A little help with bathing/dressing/bathroom;Assistance with cooking/housework;Assist for transportation;Help with stairs or ramp for entrance   Equipment Recommendations  None recommended by OT    Recommendations for Other Services      Precautions / Restrictions Precautions Precautions: Fall Recall of Precautions/Restrictions: Intact Restrictions Weight Bearing Restrictions Per Provider Order: No       Mobility Bed Mobility Overal bed mobility: Modified Independent                  Transfers Overall transfer level: Needs assistance Equipment used: None Transfers: Sit to/from Stand Sit to Stand: Supervision           General transfer comment: supervision for safety, ambulated 50 feet without AD, no physical  assist, no LOB     Balance Overall balance assessment: Mild deficits observed, not formally tested                                         ADL either performed or assessed with clinical judgement   ADL Overall ADL's : Needs assistance/impaired Eating/Feeding: Independent   Grooming: Supervision/safety;Standing                                 General ADL Comments: supervision, standing for ADLs, increased time for rest breaks and SOB.    Extremity/Trunk Assessment Upper Extremity Assessment Upper Extremity Assessment: Generalized weakness   Lower Extremity Assessment Lower Extremity Assessment: Defer to PT evaluation        Vision       Perception     Praxis     Communication Communication Communication: No apparent difficulties   Cognition Arousal: Alert Behavior During Therapy: WFL for tasks assessed/performed Cognition: No apparent impairments                               Following commands: Intact        Cueing   Cueing Techniques: Verbal cues  Exercises      Shoulder Instructions       General Comments 85-89 on RA with activities or at rest, improves quickly  to 95% or higher on 3L O2    Pertinent Vitals/ Pain       Pain Assessment Pain Assessment: No/denies pain  Home Living                                          Prior Functioning/Environment              Frequency  Min 2X/week        Progress Toward Goals  OT Goals(current goals can now be found in the care plan section)  Progress towards OT goals: Progressing toward goals  Acute Rehab OT Goals OT Goal Formulation: With patient/family Time For Goal Achievement: 05/06/24 Potential to Achieve Goals: Good ADL Goals Additional ADL Goal #1: Pt will complete basic ADLs mod I. Additional ADL Goal #2: Pt will generalize energy conservation strategies in ADLs and mobility.  Plan      Co-evaluation                  AM-PAC OT 6 Clicks Daily Activity     Outcome Measure   Help from another person eating meals?: None Help from another person taking care of personal grooming?: A Little Help from another person toileting, which includes using toliet, bedpan, or urinal?: A Little Help from another person bathing (including washing, rinsing, drying)?: A Little Help from another person to put on and taking off regular upper body clothing?: None Help from another person to put on and taking off regular lower body clothing?: A Little 6 Click Score: 20    End of Session Equipment Utilized During Treatment: Gait belt  OT Visit Diagnosis: Unsteadiness on feet (R26.81);Other (comment)   Activity Tolerance Patient tolerated treatment well   Patient Left in bed;with call bell/phone within reach;with family/visitor present   Nurse Communication Mobility status        Time: 1346-1415 OT Time Calculation (min): 29 min  Charges: OT General Charges $OT Visit: 1 Visit OT Treatments $Self Care/Home Management : 8-22 mins $Therapeutic Activity: 8-22 mins  Shantina Chronister, OTR/L   Saniyyah Elster R Zanita Millman 04/25/2024, 2:26 PM

## 2024-04-25 NOTE — Op Note (Signed)
 Indication : Severe COmmunity acquired pneumonia with lobar consolidation Written informed consent was obtained prior to the procedure. The risks of the procedure including coughing, bleeding and the small chance of lung puncture requiring chest tube were discussed in great detail. The benefits & alternatives including serial follow up were also discussed.  General anesthesia was provided Trachea & bronchial tree examined to the subsegmental level. Moderate amount of liquid greenish yellow secretions pooled in RLL airways. These were lavaged & suctioned. No endobronchial lesions seen. Brushing obtained from RLL  Patient tolerated procedure well with mild desaturation. Extubated & recovered by anesthesia  Suhayb Anzalone V.  [phone]

## 2024-04-25 NOTE — Transfer of Care (Signed)
 Immediate Anesthesia Transfer of Care Note  Patient: Brandon Dixon  Procedure(s) Performed: VIDEO BRONCHOSCOPY WITHOUT FLUORO (Bilateral)  Patient Location: PACU  Anesthesia Type:General  Level of Consciousness: awake and alert   Airway & Oxygen Therapy: Patient Spontanous Breathing and Patient connected to face mask oxygen  Post-op Assessment: Report given to RN and Post -op Vital signs reviewed and stable  Post vital signs: Reviewed and stable  Last Vitals:  Vitals Value Taken Time  BP    Temp    Pulse    Resp    SpO2      Last Pain:  Vitals:   04/25/24 1015  TempSrc: Temporal  PainSc: 0-No pain      Patients Stated Pain Goal: 0 (04/24/24 2211)  Complications: No notable events documented.

## 2024-04-26 ENCOUNTER — Inpatient Hospital Stay (HOSPITAL_COMMUNITY)

## 2024-04-26 DIAGNOSIS — J189 Pneumonia, unspecified organism: Secondary | ICD-10-CM | POA: Diagnosis not present

## 2024-04-26 LAB — BLASTOMYCES ANTIGEN
Blastomyces Antigen: NOT DETECTED ng/mL
Interpretation: NEGATIVE

## 2024-04-26 LAB — CBC
HCT: 35.8 % — ABNORMAL LOW (ref 39.0–52.0)
Hemoglobin: 12.4 g/dL — ABNORMAL LOW (ref 13.0–17.0)
MCH: 29.7 pg (ref 26.0–34.0)
MCHC: 34.6 g/dL (ref 30.0–36.0)
MCV: 85.6 fL (ref 80.0–100.0)
Platelets: 636 K/uL — ABNORMAL HIGH (ref 150–400)
RBC: 4.18 MIL/uL — ABNORMAL LOW (ref 4.22–5.81)
RDW: 15.2 % (ref 11.5–15.5)
WBC: 21.7 K/uL — ABNORMAL HIGH (ref 4.0–10.5)
nRBC: 0 % (ref 0.0–0.2)

## 2024-04-26 LAB — CYTOLOGY - NON PAP

## 2024-04-26 LAB — HEPATITIS A ANTIBODY, TOTAL: hep A Total Ab: NONREACTIVE

## 2024-04-26 LAB — MAGNESIUM: Magnesium: 2.2 mg/dL (ref 1.7–2.4)

## 2024-04-26 LAB — HIV ANTIBODY (ROUTINE TESTING W REFLEX): HIV Screen 4th Generation wRfx: NONREACTIVE

## 2024-04-26 LAB — Q FEVER ANTIBODIES, IGG
Q Fever Phase I: NEGATIVE
Q Fever Phase II: NEGATIVE

## 2024-04-26 LAB — GLUCOSE, CAPILLARY
Glucose-Capillary: 136 mg/dL — ABNORMAL HIGH (ref 70–99)
Glucose-Capillary: 200 mg/dL — ABNORMAL HIGH (ref 70–99)
Glucose-Capillary: 130 mg/dL — ABNORMAL HIGH (ref 70–99)
Glucose-Capillary: 105 mg/dL — ABNORMAL HIGH (ref 70–99)

## 2024-04-26 LAB — RAPID HIV SCREEN (HIV 1/2 AB+AG)
HIV 1/2 Antibodies: NONREACTIVE
HIV-1 P24 Antigen - HIV24: NONREACTIVE

## 2024-04-26 LAB — HEPATITIS B SURFACE ANTIGEN: Hepatitis B Surface Ag: NONREACTIVE

## 2024-04-26 MED ORDER — INSULIN ASPART 100 UNIT/ML IJ SOLN
3.0000 [IU] | Freq: Three times a day (TID) | INTRAMUSCULAR | Status: DC
  Administered 2024-04-28: 3 [IU] via SUBCUTANEOUS

## 2024-04-26 MED ORDER — LINEZOLID 600 MG PO TABS
600.0000 mg | ORAL_TABLET | Freq: Two times a day (BID) | ORAL | Status: DC
  Administered 2024-04-26: 600 mg via ORAL
  Filled 2024-04-26 (×2): qty 1

## 2024-04-26 NOTE — Plan of Care (Signed)
   Problem: Education: Goal: Knowledge of General Education information will improve Description Including pain rating scale, medication(s)/side effects and non-pharmacologic comfort measures Outcome: Progressing   Problem: Clinical Measurements: Goal: Will remain free from infection Outcome: Progressing   Problem: Nutrition: Goal: Adequate nutrition will be maintained Outcome: Progressing   Problem: Coping: Goal: Level of anxiety will decrease Outcome: Progressing

## 2024-04-26 NOTE — Anesthesia Postprocedure Evaluation (Signed)
 Anesthesia Post Note  Patient: Brandon Dixon  Procedure(s) Performed: VIDEO BRONCHOSCOPY WITHOUT FLUORO (Bilateral)     Patient location during evaluation: Endoscopy Anesthesia Type: General Level of consciousness: sedated and patient cooperative Pain management: pain level controlled Vital Signs Assessment: post-procedure vital signs reviewed and stable Respiratory status: spontaneous breathing Cardiovascular status: stable Anesthetic complications: no   No notable events documented.  Last Vitals:  Vitals:   04/26/24 1503 04/26/24 2027  BP: (!) 151/70 (!) 168/81  Pulse: 86 90  Resp: 18   Temp: 36.8 C 37.3 C  SpO2: 97% 93%    Last Pain:  Vitals:   04/26/24 2027  TempSrc: Oral  PainSc: 0-No pain                 Norleen Pope

## 2024-04-26 NOTE — Progress Notes (Signed)
 NAME:  Brandon Dixon, MRN:  [id number], DOB:  05-05-1946, LOS: 7 ADMISSION DATE:  04/19/2024, CONSULTATION DATE:  04/26/2024  REFERRING MD:  Caleen, TRH, CHIEF COMPLAINT: Pneumonia  History of Present Illness:  78 year old man presented to PCP on 6/30 with cough yellow sputum.  He was treated with IM ceftriaxone  given 10-day course of Augmentin . On follow-up visit 7/2 he was noted to be hypoxic, not able to tolerate Augmentin  due to GI upset and was sent to the ED. ED course -febrile 100.6, leukocytosis 18K , CT angiogram chest showed large area of consolidation involving right middle and lower lobes, small right effusion.  Ultrasound chest on 7/4 only showed trace effusion CT abdomen/pelvis on 7/5 showed consolidation extensively in the right middle and lower lobes, minimal left lower lobe with small right effusion. He had persistent fever and ID was consulted.  Antibiotics were changed to Zosyn  and linezolid  to cover Pseudomonas and MRSA.  Of note RVP and MRSA PCR was negative results of blood cultures. Last fever of 103 is noted on 7/6.  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 rate 18, height 6' (1.829 m), weight 77.2 kg, SpO2 94%.        Intake/Output Summary (Last 24 hours) at 04/26/2024 1033 Last data filed at 04/26/2024 0400 Gross per 24 hour  Intake 1129.27 ml  Output --  Net 1129.27 ml   Filed Weights    04/19/24 2050 04/23/24 0643 04/25/24 1015  Weight: 77.9 kg 77.2 kg 77.2 kg    Examination: Gen. Pleasant, well-nourished, in no distress, normal affect ENT - no pallor,icterus, JVD Neck: No JVD, no thyromegaly, no carotid bruits Lungs: Decreased breath sounds on right, no accessory muscle use Cardiovascular: Rhythm regular, heart sounds  normal, no murmurs or gallops, no peripheral edema Abdomen: soft and non-tender, no hepatosplenomegaly, BS normal. Musculoskeletal: No deformities, no cyanosis or clubbing Neuro:  alert, non focal   Labs show slight decrease in leukocytosis  Resolved problem list   Assessment and Plan   Severe community-acquired pneumonia Multifocal pneumonia -No organism isolated but clearly appears bacterial given progression on imaging.  He has finally defervesced and repeat CT abdomen also shows improved aeration tracing clinical improvement but he has persistent leukocytosis.  There is no evidence of empyema.  Due to chronic low-dose prednisone , concern for uncommon organism.  Urine strep and Legionella antigen negative  - Await BAL data - Continue Zosyn , linezolid  can be stopped in my opinion since MRSA PCR negative - Diarrhea likely related to antibiotics - Chest x-ray tomorrow    Best Practice (right click and Reselect all SmartList Selections daily)    Code Status:  full code Last date of multidisciplinary goals of care discussion [NA]  Labs   CBC: Recent Labs  Lab 04/22/24 0632 04/23/24 0245 04/24/24 0242 04/25/24 0316 04/26/24 0436  WBC 22.7* 24.7* 24.3* 22.5* 21.7*  HGB 13.5 12.6* 12.6* 12.5* 12.4*  HCT 39.0 36.0* 36.5*  36.2* 35.8*  MCV 85.0 84.1 83.7 83.6 85.6  PLT 273 291 376 482* 636*    Basic Metabolic Panel: Recent Labs  Lab 04/20/24 0205 04/21/24 0853 04/22/24 9367 04/23/24 0245 04/24/24 0242 04/25/24 0316 04/26/24 0436  NA 135 134* 134* 135 136 135  --   K 4.0 4.0 3.7 3.6 3.5 3.8  --   CL 100 100 98 100 100 100   --   CO2 21* 21* 23 25 26 25   --   GLUCOSE 195* 120* 115* 96 124* 117*  --   BUN 22 29* 28* 24* 20 18  --   CREATININE 1.42* 1.16 1.23 1.17 1.07 1.04  --   CALCIUM  8.3* 8.3* 8.2* 7.7* 8.0* 7.8*  --   MG 2.2 2.5* 2.3 2.3 2.3 2.1 2.2  PHOS 3.6 3.6  --   --   --   --   --    GFR: Estimated Creatinine Clearance: 63.9 mL/min (by C-G formula based on SCr of 1.04 mg/dL). Recent Labs  Lab 04/20/24 0205 04/20/24 0905 04/21/24 0336 04/22/24 9367 04/23/24 0245 04/24/24 0242 04/25/24 0316 04/26/24 0436  PROCALCITON 6.79  --   --  4.89  --   --   --   --   WBC 15.9*  --    < > 22.7* 24.7* 24.3* 22.5* 21.7*  LATICACIDVEN  --  3.1*  --   --   --   --   --   --    < > = values in this interval not displayed.    Liver Function Tests: Recent Labs  Lab 04/21/24 0853 04/22/24 9367 04/23/24 0245 04/24/24 0242 04/25/24 0316  AST 89* 77* 73* 110* 159*  ALT 37 33 31 40 57*  ALKPHOS 148* 134* 126 139* 137*  BILITOT 0.9 0.5 0.9 0.6 0.7  PROT 6.3* 6.2* 5.7* 5.9* 5.7*  ALBUMIN  1.9* 1.8* 1.7* 1.7* 1.6*   No results for input(s): LIPASE, AMYLASE in the last 168 hours. No results for input(s): AMMONIA in the last 168 hours.  ABG    Component Value Date/Time   HCO3 23.7 04/19/2024 1536   TCO2 25 04/19/2024 1536   ACIDBASEDEF 1.0 04/19/2024 1536   O2SAT 32 04/19/2024 1536     Coagulation Profile: No results for input(s): INR, PROTIME in the last 168 hours.  Cardiac Enzymes: Recent Labs  Lab 04/19/24 1530  CKTOTAL 189    HbA1C: Hgb A1c MFr Bld  Date/Time Value Ref Range Status  04/20/2024 02:05 AM 6.6 (H) 4.8 - 5.6 % Final    Comment:    (NOTE)         Prediabetes: 5.7 - 6.4         Diabetes: >6.4         Glycemic control for adults with diabetes: <7.0   12/15/2023 10:39 AM 6.4 4.6 - 6.5 % Final    Comment:    Glycemic Control Guidelines for People with Diabetes:Non Diabetic:  <6%Goal of Therapy: <7%Additional Action Suggested:  >8%     CBG: Recent Labs  Lab  04/25/24 1155 04/25/24 1603 04/25/24 2052 04/25/24 2226 04/26/24 0639  GLUCAP 136* 280* 295* 310* 136*    Harden Staff MD. FCCP. [facility] Pager : [phone]  If no response to pager , please call [phone] until 7 pm After 7:00 pm call Elink  [phone]   04/26/2024

## 2024-04-26 NOTE — Progress Notes (Signed)
 Physical Therapy Treatment Patient Details Name: Brandon Dixon MRN: [id number] DOB: 1946-10-17 Today's Date: 04/26/2024   History of Present Illness Pt is a 78 year old man admitted with CAP, sepsis and respiratory failure with hypoxia on 04/19/24. Hospital course complicated by new onset afib with RVR. PMH: PAD, PMR on chronic prednisone , HTN, HLD, prediabetes, BPH, GERD, B rotator cuff repairs.    PT Comments  Pt is progressing well towards goals. Currently pt was able to ambulate 120 ft on room air with O2 sats 90-92% and HR ~ 95 bpm. Pt is supervision for sit to stand and gait without an AD. Pt was educated on purse lipped breathing when feeling short of breathe which helped with O2 sats during activity. Due to pt current functional status, home set up and available assistance at home no recommended skilled physical therapy services at this time on discharge from acute care hospital setting. Will continue to follow in acute setting in order to ensure that pt returns home with decreased risk for falls, injury, re-hospitalization and improved activity tolerance.      If plan is discharge home, recommend the following: A little help with walking and/or transfers;Assistance with cooking/housework;Assist for transportation;Help with stairs or ramp for entrance     Equipment Recommendations  None recommended by PT       Precautions / Restrictions Precautions Precautions: Fall Recall of Precautions/Restrictions: Intact Precaution/Restrictions Comments: airborne precautions Restrictions Weight Bearing Restrictions Per Provider Order: No     Mobility  Bed Mobility   General bed mobility comments: sitting in chair on arrival and departure.    Transfers Overall transfer level: Needs assistance Equipment used: None Transfers: Sit to/from Stand Sit to Stand: Supervision     General transfer comment: supervision for safety    Ambulation/Gait Ambulation/Gait assistance: Supervision Gait  Distance (Feet): 120 Feet Assistive device: None Gait Pattern/deviations: Step-through pattern, Decreased stride length Gait velocity: decreased Gait velocity interpretation: 1.31 - 2.62 ft/sec, indicative of limited community ambulator   General Gait Details: HR 95 bpm O2 sats 90-92% throughout gait      Balance Overall balance assessment: Modified Independent Sitting-balance support: Bilateral upper extremity supported, Feet supported Sitting balance-Leahy Scale: Good     Standing balance support: No upper extremity supported, During functional activity Standing balance-Leahy Scale: Fair Standing balance comment: no overt LOB          Communication Communication Communication: No apparent difficulties  Cognition Arousal: Alert Behavior During Therapy: WFL for tasks assessed/performed   PT - Cognitive impairments: No apparent impairments       Following commands: Intact      Cueing Cueing Techniques: Verbal cues     General Comments General comments (skin integrity, edema, etc.): spouse present during session. O2 sats remained 90% and above during gait. Down to 89% once seated after gait and quickly back up to 95%      Pertinent Vitals/Pain Pain Assessment Pain Assessment: No/denies pain     PT Goals (current goals can now be found in the care plan section) Acute Rehab PT Goals Patient Stated Goal: return to full independence PT Goal Formulation: With patient/family Time For Goal Achievement: 05/06/24 Potential to Achieve Goals: Good Progress towards PT goals: Progressing toward goals    Frequency    Min 2X/week      PT Plan  Updated POC        AM-PAC PT 6 Clicks Mobility   Outcome Measure  Help needed turning from your back to your side  while in a flat bed without using bedrails?: None Help needed moving from lying on your back to sitting on the side of a flat bed without using bedrails?: None Help needed moving to and from a bed to a chair  (including a wheelchair)?: A Little Help needed standing up from a chair using your arms (e.g., wheelchair or bedside chair)?: A Little Help needed to walk in hospital room?: A Little Help needed climbing 3-5 steps with a railing? : A Little 6 Click Score: 20    End of Session   Activity Tolerance: Patient tolerated treatment well Patient left: with call bell/phone within reach;with family/visitor present;in chair Nurse Communication: Mobility status PT Visit Diagnosis: Unsteadiness on feet (R26.81);Muscle weakness (generalized) (M62.81)     Time: 8977-8962 PT Time Calculation (min) (ACUTE ONLY): 15 min  Charges:    $Therapeutic Activity: 8-22 mins PT General Charges $$ ACUTE PT VISIT: 1 Visit                     Dorothyann Maier, DPT, CLT  Acute Rehabilitation Services Office: [phone] (Secure chat preferred)    Dorothyann VEAR Maier 04/26/2024, 10:42 AM

## 2024-04-26 NOTE — Progress Notes (Signed)
 PROGRESS NOTE    Brandon Dixon  FMW:[id number] DOB: Apr 10, 1946 DOA: 04/19/2024 PCP: Merna Huxley, NP    Brief Narrative:  78 y.o. male with hx of PAD, PMR on chronic prednisone , hypertension, hyperlipidemia, prediabetes, BPH, GERD, who is transferred from [facility] ED for community-acquired pneumonia with acute hypoxic respiratory failure.  Ongoing for almost 2 weeks, seen by outpatient PCP on 6/30 and was given IM Rocephin  followed by 10 days of Augmentin .  Augmentin  caused GI upset but went to go see PCP again were noted to be hypoxic therefore sent to the ER.  In the ER noted to have right lower lobe near complete consolidation on the CTA chest.  Started on IV Rocephin  and azithromycin .  Initially also in A-fib with RVR, new onset therefore started on p.o. metoprolol . Hospital course also complicated by fluid overload requiring IV Lasix .  Slowly he was weaned off oxygen down to room air.  Due to persistent fever ID and pulmonary were consulted.  Underwent bronchoscopy 7/8.  Extensive ID workup has been initiated   Assessment & Plan:  Principal Problem:   CAP (community acquired pneumonia) Active Problems:   Acute hypoxic respiratory failure (HCC)   Sepsis (HCC)   A-fib (HCC)   AKI (acute kidney injury) (HCC)   Transaminitis   Hyperglycemia     Community-acquired pneumonia Acute hypoxic respiratory failure, secondary to above, on 8L  Sepsis, secondary to above, present on admission  Pleural effusion Failed outpatient treatment with Rocephin /Augmentin .   CTA shows near right lower lobe complete consolidation and his hypoxic.  Initially worsening oxygen requirement but improved after diuretics.  Due to persistent leukocytosis, CT chest abdomen pelvis performed which does not show any acute pathology.  IR attempted thoracentesis but minimal effusion.  MRSA swab is negative, completed azithromycin .  On IV Zosyn , linezolid  added.  Hopefully we can stop linezolid ; defer to  ID -Risk factors for total exposure -RVP, COVID/flu/RSV are negative.  C. difficile-negative - HIV, urine Legionella, urine strep, cryptococcal antigen-negative - Hepatitis panel, Q fever, Blastomyces/histoplasma-pending, AFB smears-pending - Blood cultures remain negative - ID following - Status post bronchoscopy 7/8, brushings and BAL-no organisms seen  Atrial fibrillation, new onset, initially RVR now, resolved He is now back in normal sinus rhythm.  Echo unremarkable.  TSH normal.  Hesitant to start any anticoagulation at this time.  Can consider outpatient ambulatory monitor  Acute congestive heart failure with preserved ejection fraction - Now euvolemic.  Echo  shows preserved ef, received lasix .    Acute kidney injury stage I, resolved Baseline creatinine 0.8, elevated 1.4 on admission.  Creatinine now stable around 1.2   Acute liver injury, mixed pattern AST 110, alk phos 202, other LFT normal.  May be related to underlying systemic illness. - Trend LFT, trending down.  Right upper quadrant ultrasound negative  Essential hypertension - Benicar  on hold due to mild AKI, IV as needed   History of prediabetes Hyperglycemia Chronic steroid use Hyperlipidemia Home regimen metformin  Accu-Cheks and sliding scale - A1c 6.6   Hyponatremia, mild, resolved Resolved and stable  Polymyalgia rheumatica - On prednisone  5 mg daily   Incidental findings: 8 mm nodule in the lingula: Outpatient surveillance imaging   PT/OT = outptn  DVT prophylaxis: enoxaparin  (LOVENOX ) injection 40 mg Start: 04/20/24 1000    Code Status: Full Code Family Communication: Family at bedside   Subjective: Seen at bedside, no complaints. Sitting up in a recliner Still feels weak  Examination:  General exam: Appears  calm and comfortable  Respiratory system: Some rhonchi especially on the right side Cardiovascular system: S1 & S2 heard, RRR. No JVD, murmurs, rubs, gallops or clicks. No pedal  edema. Gastrointestinal system: Abdomen is nondistended, soft and nontender. No organomegaly or masses felt. Normal bowel sounds heard. Central nervous system: Alert and oriented. No focal neurological deficits. Extremities: Symmetric 5 x 5 power. Skin: No rashes, lesions or ulcers Psychiatry: Judgement and insight appear normal. Mood & affect appropriate.                Diet Orders (From admission, onward)     Start     Ordered   04/25/24 1214  Diet regular Room service appropriate? Yes; Fluid consistency: Thin  Diet effective now       Question Answer Comment  Room service appropriate? Yes   Fluid consistency: Thin      04/25/24 1215            Objective: Vitals:   04/25/24 2254 04/26/24 0245 04/26/24 0721 04/26/24 0930  BP: 129/69 133/72 (!) 151/67 (!) 151/67  Pulse: 77 76 79 93  Resp:  18 18   Temp: 98.3 F (36.8 C) 98.2 F (36.8 C) 98.7 F (37.1 C)   TempSrc: Oral Oral Oral   SpO2: 97% 98% 94%   Weight:      Height:        Intake/Output Summary (Last 24 hours) at 04/26/2024 1129 Last data filed at 04/26/2024 0400 Gross per 24 hour  Intake 729.27 ml  Output --  Net 729.27 ml   Filed Weights   04/19/24 2050 04/23/24 0643 04/25/24 1015  Weight: 77.9 kg 77.2 kg 77.2 kg    Scheduled Meds:  aspirin  EC  81 mg Oral Daily   atorvastatin   80 mg Oral QPM   enoxaparin  (LOVENOX ) injection  40 mg Subcutaneous Q24H   feeding supplement  1 Container Oral TID BM   guaiFENesin   1,200 mg Oral BID   insulin  aspart  0-6 Units Subcutaneous TID WC   metoprolol  tartrate  25 mg Oral BID   predniSONE   5 mg Oral Q breakfast   sodium chloride  flush  3 mL Intravenous Q12H   Continuous Infusions:  linezolid  (ZYVOX ) IV 600 mg (04/26/24 0947)   piperacillin -tazobactam (ZOSYN )  IV 3.375 g (04/26/24 0946)    Nutritional status     Body mass index is 23.08 kg/m.  Data Reviewed:   CBC: Recent Labs  Lab 04/22/24 0632 04/23/24 0245 04/24/24 0242 04/25/24 0316  04/26/24 0436  WBC 22.7* 24.7* 24.3* 22.5* 21.7*  HGB 13.5 12.6* 12.6* 12.5* 12.4*  HCT 39.0 36.0* 36.5* 36.2* 35.8*  MCV 85.0 84.1 83.7 83.6 85.6  PLT 273 291 376 482* 636*   Basic Metabolic Panel: Recent Labs  Lab 04/20/24 0205 04/21/24 0853 04/22/24 9367 04/23/24 0245 04/24/24 0242 04/25/24 0316 04/26/24 0436  NA 135 134* 134* 135 136 135  --   K 4.0 4.0 3.7 3.6 3.5 3.8  --   CL 100 100 98 100 100 100  --   CO2 21* 21* 23 25 26 25   --   GLUCOSE 195* 120* 115* 96 124* 117*  --   BUN 22 29* 28* 24* 20 18  --   CREATININE 1.42* 1.16 1.23 1.17 1.07 1.04  --   CALCIUM  8.3* 8.3* 8.2* 7.7* 8.0* 7.8*  --   MG 2.2 2.5* 2.3 2.3 2.3 2.1 2.2  PHOS 3.6 3.6  --   --   --   --   --  GFR: Estimated Creatinine Clearance: 63.9 mL/min (by C-G formula based on SCr of 1.04 mg/dL). Liver Function Tests: Recent Labs  Lab 04/21/24 0853 04/22/24 9367 04/23/24 0245 04/24/24 0242 04/25/24 0316  AST 89* 77* 73* 110* 159*  ALT 37 33 31 40 57*  ALKPHOS 148* 134* 126 139* 137*  BILITOT 0.9 0.5 0.9 0.6 0.7  PROT 6.3* 6.2* 5.7* 5.9* 5.7*  ALBUMIN  1.9* 1.8* 1.7* 1.7* 1.6*   No results for input(s): LIPASE, AMYLASE in the last 168 hours. No results for input(s): AMMONIA in the last 168 hours. Coagulation Profile: No results for input(s): INR, PROTIME in the last 168 hours. Cardiac Enzymes: Recent Labs  Lab 04/19/24 1530  CKTOTAL 189   BNP (last 3 results) No results for input(s): PROBNP in the last 8760 hours. HbA1C: No results for input(s): HGBA1C in the last 72 hours. CBG: Recent Labs  Lab 04/25/24 1155 04/25/24 1603 04/25/24 2052 04/25/24 2226 04/26/24 0639  GLUCAP 136* 280* 295* 310* 136*   Lipid Profile: No results for input(s): CHOL, HDL, LDLCALC, TRIG, CHOLHDL, LDLDIRECT in the last 72 hours. Thyroid  Function Tests: No results for input(s): TSH, T4TOTAL, FREET4, T3FREE, THYROIDAB in the last 72 hours. Anemia Panel: No results for  input(s): VITAMINB12, FOLATE, FERRITIN, TIBC, IRON, RETICCTPCT in the last 72 hours. Sepsis Labs: Recent Labs  Lab 04/20/24 0205 04/20/24 0905 04/22/24 0632  PROCALCITON 6.79  --  4.89  LATICACIDVEN  --  3.1*  --     Recent Results (from the past 240 hours)  Urine Culture     Status: None   Collection Time: 04/18/24  7:24 AM   Specimen: Urine  Result Value Ref Range Status   MICRO NUMBER: [id number]  Final   SPECIMEN QUALITY: Adequate  Final   Sample Source URINE  Final   STATUS: FINAL  Final   Result: No Growth  Final  Resp panel by RT-PCR (RSV, Flu A&B, Covid) Anterior Nasal Swab     Status: None   Collection Time: 04/19/24  2:46 PM   Specimen: Anterior Nasal Swab  Result Value Ref Range Status   SARS Coronavirus 2 by RT PCR NEGATIVE NEGATIVE Final    Comment: (NOTE) SARS-CoV-2 target nucleic acids are NOT DETECTED.  The SARS-CoV-2 RNA is generally detectable in upper respiratory specimens during the acute phase of infection. The lowest concentration of SARS-CoV-2 viral copies this assay can detect is 138 copies/mL. A negative result does not preclude SARS-Cov-2 infection and should not be used as the sole basis for treatment or other patient management decisions. A negative result may occur with  improper specimen collection/handling, submission of specimen other than nasopharyngeal swab, presence of viral mutation(s) within the areas targeted by this assay, and inadequate number of viral copies(<138 copies/mL). A negative result must be combined with clinical observations, patient history, and epidemiological information. The expected result is Negative.  Fact Sheet for Patients:  BloggerCourse.com  Fact Sheet for Healthcare Providers:  SeriousBroker.it  This test is no t yet approved or cleared by the [place]  FDA and  has been authorized for detection and/or diagnosis of SARS-CoV-2 by FDA under an  Emergency Use Authorization (EUA). This EUA will remain  in effect (meaning this test can be used) for the duration of the COVID-19 declaration under Section 564(b)(1) of the Act, 21 U.S.C.section 360bbb-3(b)(1), unless the authorization is terminated  or revoked sooner.       Influenza A by PCR NEGATIVE NEGATIVE Final   Influenza B by PCR  NEGATIVE NEGATIVE Final    Comment: (NOTE) The Xpert Xpress SARS-CoV-2/FLU/RSV plus assay is intended as an aid in the diagnosis of influenza from Nasopharyngeal swab specimens and should not be used as a sole basis for treatment. Nasal washings and aspirates are unacceptable for Xpert Xpress SARS-CoV-2/FLU/RSV testing.  Fact Sheet for Patients: BloggerCourse.com  Fact Sheet for Healthcare Providers: SeriousBroker.it  This test is not yet approved or cleared by the [place]  FDA and has been authorized for detection and/or diagnosis of SARS-CoV-2 by FDA under an Emergency Use Authorization (EUA). This EUA will remain in effect (meaning this test can be used) for the duration of the COVID-19 declaration under Section 564(b)(1) of the Act, 21 U.S.C. section 360bbb-3(b)(1), unless the authorization is terminated or revoked.     Resp Syncytial Virus by PCR NEGATIVE NEGATIVE Final    Comment: (NOTE) Fact Sheet for Patients: BloggerCourse.com  Fact Sheet for Healthcare Providers: SeriousBroker.it  This test is not yet approved or cleared by the [place]  FDA and has been authorized for detection and/or diagnosis of SARS-CoV-2 by FDA under an Emergency Use Authorization (EUA). This EUA will remain in effect (meaning this test can be used) for the duration of the COVID-19 declaration under Section 564(b)(1) of the Act, 21 U.S.C. section 360bbb-3(b)(1), unless the authorization is terminated or revoked.  Performed at [facility], [address] [place], [place] [address]   MRSA Next Gen by PCR, Nasal     Status: None   Collection Time: 04/19/24  8:57 PM   Specimen: Nasal Mucosa; Nasal Swab  Result Value Ref Range Status   MRSA by PCR Next Gen NOT DETECTED NOT DETECTED Final    Comment: (NOTE) The GeneXpert MRSA Assay (FDA approved for NASAL specimens only), is one component of a comprehensive MRSA colonization surveillance program. It is not intended to diagnose MRSA infection nor to guide or monitor treatment for MRSA infections. Test performance is not FDA approved in patients less than 66 years old. Performed at [facility], [address], [place], [place] [address]   Culture, blood (Routine X 2) w Reflex to ID Panel     Status: None   Collection Time: 04/20/24  9:05 AM   Specimen: BLOOD  Result Value Ref Range Status   Specimen Description BLOOD SITE NOT SPECIFIED  Final   Special Requests   Final    BOTTLES DRAWN AEROBIC ONLY Blood Culture results may not be optimal due to an inadequate volume of blood received in culture bottles   Culture   Final    NO GROWTH 5 DAYS Performed at [facility], [address] [place], [place] [address]    Report Status 04/25/2024 FINAL  Final  Culture, blood (Routine X 2) w Reflex to ID Panel     Status: None   Collection Time: 04/20/24  1:37 PM   Specimen: BLOOD RIGHT ARM  Result Value Ref Range Status   Specimen Description BLOOD RIGHT ARM  Final   Special Requests   Final    BOTTLES DRAWN AEROBIC AND ANAEROBIC Blood Culture results may not be optimal due to an inadequate volume of blood received in culture bottles   Culture   Final    NO GROWTH 5 DAYS Performed at [facility], [address], [place], [place] [address]    Report Status 04/25/2024 FINAL  Final  Expectorated Sputum Assessment w Gram Stain, Rflx to Resp Cult  Status: None   Collection Time: 04/20/24  8:37 PM   Specimen: Expectorated Sputum  Result Value Ref  Range Status   Specimen Description EXPECTORATED SPUTUM  Final   Special Requests NONE  Final   Sputum evaluation   Final    Sputum specimen not acceptable for testing.  Please recollect.   Gram Stain Report Called to,Read Back By and Verified With: RN RONAL RADFORD [phone] @2325  FH Performed at [facility], [address] [place], [place] [address]    Report Status 04/20/2024 FINAL  Final  Expectorated Sputum Assessment w Gram Stain, Rflx to Resp Cult     Status: None   Collection Time: 04/21/24  4:00 AM   Specimen: Expectorated Sputum  Result Value Ref Range Status   Specimen Description EXPECTORATED SPUTUM  Final   Special Requests NONE  Final   Sputum evaluation   Final    THIS SPECIMEN IS ACCEPTABLE FOR SPUTUM CULTURE Performed at [facility], [address] [place], [place] [address]    Report Status 04/21/2024 FINAL  Final  Culture, Respiratory w Gram Stain     Status: None   Collection Time: 04/21/24  4:00 AM  Result Value Ref Range Status   Specimen Description EXPECTORATED SPUTUM  Final   Special Requests NONE Reflexed from [id number]  Final   Gram Stain   Final    FEW WBC PRESENT, PREDOMINANTLY PMN FEW BUDDING YEAST SEEN Performed at [facility], [address], [place], [place] [address]    Culture FEW CANDIDA ALBICANS  Final   Report Status 04/23/2024 FINAL  Final  Respiratory (~20 pathogens) panel by PCR     Status: None   Collection Time: 04/22/24  8:57 AM   Specimen: Nasopharyngeal Swab; Respiratory  Result Value Ref Range Status   Adenovirus NOT DETECTED NOT DETECTED Final   Coronavirus 229E NOT DETECTED NOT DETECTED Final    Comment: (NOTE) The Coronavirus on the Respiratory Panel, DOES NOT test for the novel  Coronavirus (2019 nCoV)    Coronavirus HKU1 NOT DETECTED NOT DETECTED Final   Coronavirus NL63 NOT DETECTED NOT DETECTED Final   Coronavirus OC43 NOT DETECTED NOT DETECTED Final   Metapneumovirus NOT DETECTED NOT DETECTED Final    Rhinovirus / Enterovirus NOT DETECTED NOT DETECTED Final   Influenza A NOT DETECTED NOT DETECTED Final   Influenza B NOT DETECTED NOT DETECTED Final   Parainfluenza Virus 1 NOT DETECTED NOT DETECTED Final   Parainfluenza Virus 2 NOT DETECTED NOT DETECTED Final   Parainfluenza Virus 3 NOT DETECTED NOT DETECTED Final   Parainfluenza Virus 4 NOT DETECTED NOT DETECTED Final   Respiratory Syncytial Virus NOT DETECTED NOT DETECTED Final   Bordetella pertussis NOT DETECTED NOT DETECTED Final   Bordetella Parapertussis NOT DETECTED NOT DETECTED Final   Chlamydophila pneumoniae NOT DETECTED NOT DETECTED Final   Mycoplasma pneumoniae NOT DETECTED NOT DETECTED Final    Comment: Performed at [facility], [address], [place], [place] [address]  C Difficile Quick Screen (NO PCR Reflex)     Status: None   Collection Time: 04/23/24 11:38 AM   Specimen: STOOL  Result Value Ref Range Status   C Diff antigen NEGATIVE NEGATIVE Final   C Diff toxin NEGATIVE NEGATIVE Final   C Diff interpretation No C. difficile detected.  Final    Comment: Performed at [facility], [address] [place], [place] [address]  Blastomyces Antigen  Status: None   Collection Time: 04/23/24 11:39 AM   Specimen: Blood  Result Value Ref Range Status   Blastomyces Antigen None Detected None Detected ng/mL Final    Comment: (NOTE) Reference Interval: None Detected Reportable Range: 0.31 ng/mL - 20.00 ng/mL Results above 20.00 ng/mL are reported as 'Positive, Above the Limit of Quantification' This test was developed and its performance characteristics determined by [employer]. It has not been cleared or approved by the FDA; however, FDA clearance or approval is not currently required for clinical use. The results are not intended to be used as the sole means for clinical diagnosis or patient decisions.    Interpretation Negative  Final   Specimen Type SERUM  Final    Comment:  (NOTE) Performed At: [facility] [address] [place], [place] [id number] [place] MD Ey:[id number]   Culture, BAL-quantitative w Gram Stain     Status: None (Preliminary result)   Collection Time: 04/25/24 10:46 AM   Specimen: Bronchial Alveolar Lavage; Respiratory  Result Value Ref Range Status   Specimen Description BRONCHIAL ALVEOLAR LAVAGE  Final   Special Requests NONE  Final   Gram Stain NO WBC SEEN NO ORGANISMS SEEN   Final   Culture   Final    NO GROWTH < 24 HOURS Performed at [facility], [address] [place], [place] [address]    Report Status PENDING  Incomplete  Aerobic/Anaerobic Culture w Gram Stain (surgical/deep wound)     Status: None (Preliminary result)   Collection Time: 04/25/24 10:46 AM   Specimen: Bronchial Alveolar Lavage; Respiratory  Result Value Ref Range Status   Specimen Description BRONCHIAL ALVEOLAR LAVAGE  Final   Special Requests NONE  Final   Gram Stain NO WBC SEEN NO ORGANISMS SEEN   Final   Culture   Final    NO GROWTH < 24 HOURS Performed at [facility], [address] [place], [place] [address]    Report Status PENDING  Incomplete         Radiology Studies: US  Abdomen Limited RUQ (LIVER/GB) Result Date: 04/26/2024 CLINICAL DATA:  Elevated liver enzymes. EXAM: ULTRASOUND ABDOMEN LIMITED RIGHT UPPER QUADRANT COMPARISON:  CT abdomen pelvis dated 04/22/2024. FINDINGS: Gallbladder: No gallstones or wall thickening visualized. No sonographic Murphy sign noted by sonographer. Common bile duct: Diameter: 3 mm Liver: No focal lesion identified. Within normal limits in parenchymal echogenicity. Portal vein is patent on color Doppler imaging with normal direction of blood flow towards the liver. Other: A small right pleural effusion is partially visualized. IMPRESSION: Right pleural effusion, otherwise unremarkable right upper quadrant ultrasound. Electronically Signed   By: Vanetta Chou M.D.   On:  04/26/2024 10:33           LOS: 7 days   Time spent= 35 mins    Brandon JAYSON Dare, MD [facility]  If 7PM-7AM, please contact night-coverage  04/26/2024, 11:29 AM

## 2024-04-26 NOTE — Progress Notes (Signed)
 Subjective:  He is feeling a lot better and was even able to walk in the hallway without desaturating   Antibiotics:  Anti-infectives (From admission, onward)    Start     Dose/Rate Route Frequency Ordered Stop   04/26/24 2200  linezolid  (ZYVOX ) tablet 600 mg        600 mg Oral Every 12 hours 04/26/24 1524     04/23/24 1230  linezolid  (ZYVOX ) IVPB 600 mg  Status:  Discontinued        600 mg 300 mL/hr over 60 Minutes Intravenous Every 12 hours 04/23/24 1130 04/26/24 1524   04/22/24 1000  piperacillin -tazobactam (ZOSYN ) IVPB 3.375 g        3.375 g 12.5 mL/hr over 240 Minutes Intravenous Every 8 hours 04/22/24 0909     04/20/24 1600  azithromycin  (ZITHROMAX ) tablet 500 mg        500 mg Oral Daily 04/19/24 2233 04/21/24 1507   04/20/24 1600  cefTRIAXone  (ROCEPHIN ) 1 g in sodium chloride  0.9 % 100 mL IVPB  Status:  Discontinued        1 g 200 mL/hr over 30 Minutes Intravenous Every 24 hours 04/19/24 2233 04/22/24 0856   04/19/24 1600  cefTRIAXone  (ROCEPHIN ) 1 g in sodium chloride  0.9 % 100 mL IVPB        1 g 200 mL/hr over 30 Minutes Intravenous  Once 04/19/24 1548 04/19/24 1749   04/19/24 1600  azithromycin  (ZITHROMAX ) 500 mg in sodium chloride  0.9 % 250 mL IVPB        500 mg 250 mL/hr over 60 Minutes Intravenous  Once 04/19/24 1548 04/19/24 1808       Medications: Scheduled Meds:  aspirin  EC  81 mg Oral Daily   atorvastatin   80 mg Oral QPM   enoxaparin  (LOVENOX ) injection  40 mg Subcutaneous Q24H   feeding supplement  1 Container Oral TID BM   guaiFENesin   1,200 mg Oral BID   insulin  aspart  0-6 Units Subcutaneous TID WC   insulin  aspart  3 Units Subcutaneous TID WC   linezolid   600 mg Oral Q12H   metoprolol  tartrate  25 mg Oral BID   predniSONE   5 mg Oral Q breakfast   sodium chloride  flush  3 mL Intravenous Q12H   Continuous Infusions:  piperacillin -tazobactam (ZOSYN )  IV 3.375 g (04/26/24 0946)   PRN Meds:.acetaminophen , albuterol , benzonatate ,  hydrALAZINE , melatonin, metoprolol  tartrate, ondansetron  (ZOFRAN ) IV, polyethylene glycol, senna-docusate    Objective: Weight change:   Intake/Output Summary (Last 24 hours) at 04/26/2024 1529 Last data filed at 04/26/2024 0400 Gross per 24 hour  Intake 418.57 ml  Output --  Net 418.57 ml   Blood pressure (!) 151/70, pulse 86, temperature 98.2 F (36.8 C), temperature source Oral, resp. rate 18, height 6' (1.829 m), weight 77.2 kg, SpO2 97%. Temp:  [97.7 F (36.5 C)-98.7 F (37.1 C)] 98.2 F (36.8 C) (07/09 1503) Pulse Rate:  [76-93] 86 (07/09 1503) Resp:  [18-20] 18 (07/09 1503) BP: (129-151)/(63-80) 151/70 (07/09 1503) SpO2:  [94 %-98 %] 97 % (07/09 1503)  Physical Exam: Physical Exam Constitutional:      Appearance: He is well-developed.  HENT:     Head: Normocephalic and atraumatic.  Eyes:     Conjunctiva/sclera: Conjunctivae normal.  Cardiovascular:     Rate and Rhythm: Normal rate and regular rhythm.  Pulmonary:     Effort: Pulmonary effort is normal. No respiratory distress.     Breath sounds: No  wheezing.  Abdominal:     General: There is no distension.     Palpations: Abdomen is soft.  Musculoskeletal:        General: Normal range of motion.     Cervical back: Normal range of motion and neck supple.  Skin:    General: Skin is warm and dry.     Findings: No erythema or rash.  Neurological:     General: No focal deficit present.     Mental Status: He is alert and oriented to person, place, and time.  Psychiatric:        Mood and Affect: Mood normal.        Behavior: Behavior normal.        Thought Content: Thought content normal.        Judgment: Judgment normal.      CBC:    BMET Recent Labs    04/24/24 0242 04/25/24 0316  NA 136 135  K 3.5 3.8  CL 100 100  CO2 26 25  GLUCOSE 124* 117*  BUN 20 18  CREATININE 1.07 1.04  CALCIUM  8.0* 7.8*     Liver Panel  Recent Labs    04/24/24 0242 04/25/24 0316  PROT 5.9* 5.7*  ALBUMIN  1.7*  1.6*  AST 110* 159*  ALT 40 57*  ALKPHOS 139* 137*  BILITOT 0.6 0.7       Sedimentation Rate No results for input(s): ESRSEDRATE in the last 72 hours. C-Reactive Protein No results for input(s): CRP in the last 72 hours.  Micro Results: Recent Results (from the past 720 hours)  Urine Culture     Status: None   Collection Time: 04/18/24  7:24 AM   Specimen: Urine  Result Value Ref Range Status   MICRO NUMBER: [id number]  Final   SPECIMEN QUALITY: Adequate  Final   Sample Source URINE  Final   STATUS: FINAL  Final   Result: No Growth  Final  Resp panel by RT-PCR (RSV, Flu A&B, Covid) Anterior Nasal Swab     Status: None   Collection Time: 04/19/24  2:46 PM   Specimen: Anterior Nasal Swab  Result Value Ref Range Status   SARS Coronavirus 2 by RT PCR NEGATIVE NEGATIVE Final    Comment: (NOTE) SARS-CoV-2 target nucleic acids are NOT DETECTED.  The SARS-CoV-2 RNA is generally detectable in upper respiratory specimens during the acute phase of infection. The lowest concentration of SARS-CoV-2 viral copies this assay can detect is 138 copies/mL. A negative result does not preclude SARS-Cov-2 infection and should not be used as the sole basis for treatment or other patient management decisions. A negative result may occur with  improper specimen collection/handling, submission of specimen other than nasopharyngeal swab, presence of viral mutation(s) within the areas targeted by this assay, and inadequate number of viral copies(<138 copies/mL). A negative result must be combined with clinical observations, patient history, and epidemiological information. The expected result is Negative.  Fact Sheet for Patients:  BloggerCourse.com  Fact Sheet for Healthcare Providers:  SeriousBroker.it  This test is no t yet approved or cleared by the [place]  FDA and  has been authorized for detection and/or diagnosis of SARS-CoV-2  by FDA under an Emergency Use Authorization (EUA). This EUA will remain  in effect (meaning this test can be used) for the duration of the COVID-19 declaration under Section 564(b)(1) of the Act, 21 U.S.C.section 360bbb-3(b)(1), unless the authorization is terminated  or revoked sooner.       Influenza A by  PCR NEGATIVE NEGATIVE Final   Influenza B by PCR NEGATIVE NEGATIVE Final    Comment: (NOTE) The Xpert Xpress SARS-CoV-2/FLU/RSV plus assay is intended as an aid in the diagnosis of influenza from Nasopharyngeal swab specimens and should not be used as a sole basis for treatment. Nasal washings and aspirates are unacceptable for Xpert Xpress SARS-CoV-2/FLU/RSV testing.  Fact Sheet for Patients: BloggerCourse.com  Fact Sheet for Healthcare Providers: SeriousBroker.it  This test is not yet approved or cleared by the [place]  FDA and has been authorized for detection and/or diagnosis of SARS-CoV-2 by FDA under an Emergency Use Authorization (EUA). This EUA will remain in effect (meaning this test can be used) for the duration of the COVID-19 declaration under Section 564(b)(1) of the Act, 21 U.S.C. section 360bbb-3(b)(1), unless the authorization is terminated or revoked.     Resp Syncytial Virus by PCR NEGATIVE NEGATIVE Final    Comment: (NOTE) Fact Sheet for Patients: BloggerCourse.com  Fact Sheet for Healthcare Providers: SeriousBroker.it  This test is not yet approved or cleared by the [place]  FDA and has been authorized for detection and/or diagnosis of SARS-CoV-2 by FDA under an Emergency Use Authorization (EUA). This EUA will remain in effect (meaning this test can be used) for the duration of the COVID-19 declaration under Section 564(b)(1) of the Act, 21 U.S.C. section 360bbb-3(b)(1), unless the authorization is terminated or revoked.  Performed at [facility], [address] [place], [place] [address]   MRSA Next Gen by PCR, Nasal     Status: None   Collection Time: 04/19/24  8:57 PM   Specimen: Nasal Mucosa; Nasal Swab  Result Value Ref Range Status   MRSA by PCR Next Gen NOT DETECTED NOT DETECTED Final    Comment: (NOTE) The GeneXpert MRSA Assay (FDA approved for NASAL specimens only), is one component of a comprehensive MRSA colonization surveillance program. It is not intended to diagnose MRSA infection nor to guide or monitor treatment for MRSA infections. Test performance is not FDA approved in patients less than 64 years old. Performed at [facility], [address], [place], [place] [address]   Culture, blood (Routine X 2) w Reflex to ID Panel     Status: None   Collection Time: 04/20/24  9:05 AM   Specimen: BLOOD  Result Value Ref Range Status   Specimen Description BLOOD SITE NOT SPECIFIED  Final   Special Requests   Final    BOTTLES DRAWN AEROBIC ONLY Blood Culture results may not be optimal due to an inadequate volume of blood received in culture bottles   Culture   Final    NO GROWTH 5 DAYS Performed at [facility], [address] [place], [place] [address]    Report Status 04/25/2024 FINAL  Final  Culture, blood (Routine X 2) w Reflex to ID Panel     Status: None   Collection Time: 04/20/24  1:37 PM   Specimen: BLOOD RIGHT ARM  Result Value Ref Range Status   Specimen Description BLOOD RIGHT ARM  Final   Special Requests   Final    BOTTLES DRAWN AEROBIC AND ANAEROBIC Blood Culture results may not be optimal due to an inadequate volume of blood received in culture bottles   Culture   Final    NO GROWTH 5 DAYS Performed at [facility], [address], [place], [place] [address]    Report Status 04/25/2024 FINAL  Final  Expectorated Sputum Assessment  w Gram Stain, Rflx to Resp Cult     Status: None   Collection Time: 04/20/24  8:37 PM   Specimen: Expectorated Sputum  Result  Value Ref Range Status   Specimen Description EXPECTORATED SPUTUM  Final   Special Requests NONE  Final   Sputum evaluation   Final    Sputum specimen not acceptable for testing.  Please recollect.   Gram Stain Report Called to,Read Back By and Verified With: RN RONAL RADFORD [phone] @2325  FH Performed at [facility], [address] [place], [place] [address]    Report Status 04/20/2024 FINAL  Final  Expectorated Sputum Assessment w Gram Stain, Rflx to Resp Cult     Status: None   Collection Time: 04/21/24  4:00 AM   Specimen: Expectorated Sputum  Result Value Ref Range Status   Specimen Description EXPECTORATED SPUTUM  Final   Special Requests NONE  Final   Sputum evaluation   Final    THIS SPECIMEN IS ACCEPTABLE FOR SPUTUM CULTURE Performed at [facility], [address], [place], [place] [address]    Report Status 04/21/2024 FINAL  Final  Culture, Respiratory w Gram Stain     Status: None   Collection Time: 04/21/24  4:00 AM  Result Value Ref Range Status   Specimen Description EXPECTORATED SPUTUM  Final   Special Requests NONE Reflexed from [id number]  Final   Gram Stain   Final    FEW WBC PRESENT, PREDOMINANTLY PMN FEW BUDDING YEAST SEEN Performed at [facility], [address] [place], [place] [address]    Culture FEW CANDIDA ALBICANS  Final   Report Status 04/23/2024 FINAL  Final  Respiratory (~20 pathogens) panel by PCR     Status: None   Collection Time: 04/22/24  8:57 AM   Specimen: Nasopharyngeal Swab; Respiratory  Result Value Ref Range Status   Adenovirus NOT DETECTED NOT DETECTED Final   Coronavirus 229E NOT DETECTED NOT DETECTED Final    Comment: (NOTE) The Coronavirus on the Respiratory Panel, DOES NOT test for the novel  Coronavirus (2019 nCoV)    Coronavirus HKU1 NOT DETECTED NOT DETECTED Final   Coronavirus NL63 NOT DETECTED NOT DETECTED Final   Coronavirus OC43 NOT DETECTED NOT DETECTED Final   Metapneumovirus NOT DETECTED NOT DETECTED  Final   Rhinovirus / Enterovirus NOT DETECTED NOT DETECTED Final   Influenza A NOT DETECTED NOT DETECTED Final   Influenza B NOT DETECTED NOT DETECTED Final   Parainfluenza Virus 1 NOT DETECTED NOT DETECTED Final   Parainfluenza Virus 2 NOT DETECTED NOT DETECTED Final   Parainfluenza Virus 3 NOT DETECTED NOT DETECTED Final   Parainfluenza Virus 4 NOT DETECTED NOT DETECTED Final   Respiratory Syncytial Virus NOT DETECTED NOT DETECTED Final   Bordetella pertussis NOT DETECTED NOT DETECTED Final   Bordetella Parapertussis NOT DETECTED NOT DETECTED Final   Chlamydophila pneumoniae NOT DETECTED NOT DETECTED Final   Mycoplasma pneumoniae NOT DETECTED NOT DETECTED Final    Comment: Performed at [facility], [address] [place], [place] [address]  C Difficile Quick Screen (NO PCR Reflex)     Status: None   Collection Time: 04/23/24 11:38 AM   Specimen: STOOL  Result Value Ref Range Status   C Diff antigen NEGATIVE NEGATIVE Final   C Diff toxin NEGATIVE NEGATIVE Final   C Diff interpretation No C. difficile detected.  Final    Comment: Performed at [facility], [address], [place], [place] [address]  Blastomyces Antigen     Status: None   Collection Time: 04/23/24 11:39 AM   Specimen: Blood  Result Value Ref Range Status   Blastomyces Antigen None Detected None Detected ng/mL Final    Comment: (NOTE) Reference Interval: None Detected Reportable Range: 0.31 ng/mL - 20.00 ng/mL Results above 20.00 ng/mL are reported as 'Positive, Above the Limit of Quantification' This test was developed and its performance characteristics determined by [employer]. It has not been cleared or approved by the FDA; however, FDA clearance or approval is not currently required for clinical use. The results are not intended to be used as the sole means for clinical diagnosis or patient decisions.    Interpretation Negative  Final   Specimen Type SERUM  Final    Comment:  (NOTE) Performed At: [facility] [address] [place], [place] [id number] [place] MD Ey:[id number]   Culture, BAL-quantitative w Gram Stain     Status: None (Preliminary result)   Collection Time: 04/25/24 10:46 AM   Specimen: Bronchial Alveolar Lavage; Respiratory  Result Value Ref Range Status   Specimen Description BRONCHIAL ALVEOLAR LAVAGE  Final   Special Requests NONE  Final   Gram Stain NO WBC SEEN NO ORGANISMS SEEN   Final   Culture   Final    NO GROWTH < 24 HOURS Performed at [facility], [address], [place], [place] [address]    Report Status PENDING  Incomplete  Aerobic/Anaerobic Culture w Gram Stain (surgical/deep wound)     Status: None (Preliminary result)   Collection Time: 04/25/24 10:46 AM   Specimen: Bronchial Alveolar Lavage; Respiratory  Result Value Ref Range Status   Specimen Description BRONCHIAL ALVEOLAR LAVAGE  Final   Special Requests NONE  Final   Gram Stain NO WBC SEEN NO ORGANISMS SEEN   Final   Culture   Final    NO GROWTH < 24 HOURS Performed at [facility], [address] [place], [place] [address]    Report Status PENDING  Incomplete    Studies/Results: US  Abdomen Limited RUQ (LIVER/GB) Result Date: 04/26/2024 CLINICAL DATA:  Elevated liver enzymes. EXAM: ULTRASOUND ABDOMEN LIMITED RIGHT UPPER QUADRANT COMPARISON:  CT abdomen pelvis dated 04/22/2024. FINDINGS: Gallbladder: No gallstones or wall thickening visualized. No sonographic Murphy sign noted by sonographer. Common bile duct: Diameter: 3 mm Liver: No focal lesion identified. Within normal limits in parenchymal echogenicity. Portal vein is patent on color Doppler imaging with normal direction of blood flow towards the liver. Other: A small right pleural effusion is partially visualized. IMPRESSION: Right pleural effusion, otherwise unremarkable right upper quadrant ultrasound. Electronically Signed   By: Vanetta Chou M.D.   On: 04/26/2024 10:33        Assessment/Plan:  INTERVAL HISTORY:   Pt submitted new AFB culture today   Principal Problem:   Pneumonia of right lower lobe due to infectious organism Active Problems:   Acute hypoxic respiratory failure (HCC)   Sepsis (HCC)   A-fib (HCC)   AKI (acute kidney injury) (HCC)   Transaminitis   Hyperglycemia   Leukocytosis    Brandon Dixon is a 78 y.o. male with polymyalgia rheumatica on chronic prednisone  5 mg/day atrial fibrillation who has been admitted with a nonresolving pneumonia  #1 Slow to resolve pneumonia:   Mentioned previously he had exposure to calves who had recently been birthed which raises the possibility of Brucella and Coxiella    He had been  treating him with Zosyn  and Zyvox  and he seems to be getting better  Of the two zoonoses we have thought about Coxiella is more common cause of pneumonia  Zyvox  actually appears to have some activity vs this organism  I will continue him on antibacterial antibiotics at present of which he has received 5 days of zosyn  and 4 of zyvox  after other abx  I may be inclined to give him dose of invanz tomorrow to give him 6 days of quite broad coverage for GNR (though not pseudmonas) and then exchange the zyvox  for doxy and give for 14 days  #2 examine-itis: This seems to be worsening and again raises the concern that this could be a zoonotic infection such as Coxiella Brucella.  I will repeat his liver function test tomorrow his hepatitis panel was negative as was his HIV antibody  #3 IP In terms of his isolation precautions he needs to be in airborne till we have 3 negative smears this would not prevent him from going home he could go home but just wear a mask in public until we have 3 negative smears   I have personally spent 56 minutes involved in face-to-face and non-face-to-face activities for this patient on the day of the visit. Professional time spent includes the following activities: Preparing to see the  patient (review of tests), Obtaining and/or reviewing separately obtained history (admission/discharge record), Performing a medically appropriate examination and/or evaluation , Ordering medications/tests/procedures, referring and communicating with other health care professionals, Documenting clinical information in the EMR, Independently interpreting results (not separately reported), Communicating results to the patient/family/caregiver, Counseling and educating the patient/family/caregiver and Care coordination (not separately reported).   Evaluation of the patient requires complex antimicrobial therapy evaluation, counseling , isolation needs to reduce disease transmission and risk assessment and mitigation.      LOS: 7 days   Jomarie Fleeta Rothman 04/26/2024, 3:29 PM

## 2024-04-26 NOTE — Progress Notes (Signed)
 Mobility Specialist Progress Note;   04/26/24 0852  Mobility  Activity Transferred from bed to chair  Level of Assistance Standby assist, set-up cues, supervision of patient - no hands on  Assistive Device None  Distance Ambulated (ft) 7 ft  Activity Response Tolerated well  Mobility Referral Yes  Mobility visit 1 Mobility  Mobility Specialist Start Time (ACUTE ONLY) [id number]  Mobility Specialist Stop Time (ACUTE ONLY) 0858  Mobility Specialist Time Calculation (min) (ACUTE ONLY) 6 min   Pt in BR upon arrival, agreeable to mobility. Required no physical assistance to transfer to chair. SPO2 91-95% on RA. No c/o when asked. Pt left comfortably in chair with all needs met. Wife present.  Lauraine Erm Mobility Specialist Please contact via SecureChat or [employer] [phone]

## 2024-04-26 NOTE — Inpatient Diabetes Management (Signed)
 Inpatient Diabetes Program Recommendations  AACE/ADA: New Consensus Statement on Inpatient Glycemic Control (2015)  Target Ranges:  Prepandial:   less than 140 mg/dL      Peak postprandial:   less than 180 mg/dL (1-2 hours)      Critically ill patients:  140 - 180 mg/dL   Lab Results  Component Value Date   GLUCAP 136 (H) 04/26/2024   HGBA1C 6.6 (H) 04/20/2024    Latest Reference Range & Units 04/25/24 11:55 04/25/24 16:03 04/25/24 20:52 04/25/24 22:26 04/26/24 06:39  Glucose-Capillary 70 - 99 mg/dL 863 (H) 719 (H) 704 (H) 310 (H) 136 (H)  (H): Data is abnormally high Review of Glycemic Control  Diabetes history: DM2 Outpatient Diabetes medications: Metformin  XR 500 mg BID Current orders for Inpatient glycemic control: Novolog  0-6 units correction scale TID  Inpatient Diabetes Program Recommendations:   Noted that postprandial blood sugars have been greater than 180 mg/dl.   Recommend adding Novolog  3 units TID with meals if eating at least 50% of meal and while on steroids.  Marjorie Lunger RN BSN CDE Diabetes Coordinator Pager: [phone]  8am-5pm

## 2024-04-27 ENCOUNTER — Inpatient Hospital Stay (HOSPITAL_COMMUNITY)

## 2024-04-27 DIAGNOSIS — D72829 Elevated white blood cell count, unspecified: Secondary | ICD-10-CM | POA: Diagnosis not present

## 2024-04-27 DIAGNOSIS — N179 Acute kidney failure, unspecified: Secondary | ICD-10-CM | POA: Diagnosis not present

## 2024-04-27 DIAGNOSIS — R739 Hyperglycemia, unspecified: Secondary | ICD-10-CM

## 2024-04-27 DIAGNOSIS — R7401 Elevation of levels of liver transaminase levels: Secondary | ICD-10-CM

## 2024-04-27 DIAGNOSIS — J189 Pneumonia, unspecified organism: Secondary | ICD-10-CM | POA: Diagnosis not present

## 2024-04-27 DIAGNOSIS — M353 Polymyalgia rheumatica: Secondary | ICD-10-CM

## 2024-04-27 DIAGNOSIS — I4891 Unspecified atrial fibrillation: Secondary | ICD-10-CM | POA: Diagnosis not present

## 2024-04-27 DIAGNOSIS — R6520 Severe sepsis without septic shock: Secondary | ICD-10-CM

## 2024-04-27 DIAGNOSIS — A419 Sepsis, unspecified organism: Secondary | ICD-10-CM

## 2024-04-27 DIAGNOSIS — K7200 Acute and subacute hepatic failure without coma: Secondary | ICD-10-CM

## 2024-04-27 LAB — CULTURE, BAL-QUANTITATIVE W GRAM STAIN
Culture: NO GROWTH
Gram Stain: NONE SEEN

## 2024-04-27 LAB — CBC
HCT: 39 % (ref 39.0–52.0)
Hemoglobin: 13.3 g/dL (ref 13.0–17.0)
MCH: 29.3 pg (ref 26.0–34.0)
MCHC: 34.1 g/dL (ref 30.0–36.0)
MCV: 85.9 fL (ref 80.0–100.0)
Platelets: 761 K/uL — ABNORMAL HIGH (ref 150–400)
RBC: 4.54 MIL/uL (ref 4.22–5.81)
RDW: 15 % (ref 11.5–15.5)
WBC: 22.6 K/uL — ABNORMAL HIGH (ref 4.0–10.5)
nRBC: 0 % (ref 0.0–0.2)

## 2024-04-27 LAB — COMPREHENSIVE METABOLIC PANEL WITH GFR
ALT: 129 U/L — ABNORMAL HIGH (ref 0–44)
AST: 240 U/L — ABNORMAL HIGH (ref 15–41)
Albumin: 1.8 g/dL — ABNORMAL LOW (ref 3.5–5.0)
Alkaline Phosphatase: 218 U/L — ABNORMAL HIGH (ref 38–126)
Anion gap: 11 (ref 5–15)
BUN: 15 mg/dL (ref 8–23)
CO2: 22 mmol/L (ref 22–32)
Calcium: 8.3 mg/dL — ABNORMAL LOW (ref 8.9–10.3)
Chloride: 102 mmol/L (ref 98–111)
Creatinine, Ser: 0.98 mg/dL (ref 0.61–1.24)
GFR, Estimated: >60 mL/min (ref >60)
Glucose, Bld: 169 mg/dL — ABNORMAL HIGH (ref 70–99)
Potassium: 4 mmol/L (ref 3.5–5.1)
Sodium: 135 mmol/L (ref 135–145)
Total Bilirubin: 0.7 mg/dL (ref 0.0–1.2)
Total Protein: 6.9 g/dL (ref 6.5–8.1)

## 2024-04-27 LAB — MAGNESIUM: Magnesium: 1.9 mg/dL (ref 1.7–2.4)

## 2024-04-27 LAB — GLUCOSE, CAPILLARY
Glucose-Capillary: 109 mg/dL — ABNORMAL HIGH (ref 70–99)
Glucose-Capillary: 167 mg/dL — ABNORMAL HIGH (ref 70–99)
Glucose-Capillary: 128 mg/dL — ABNORMAL HIGH (ref 70–99)
Glucose-Capillary: 152 mg/dL — ABNORMAL HIGH (ref 70–99)

## 2024-04-27 LAB — BRUCELLA ANTIBODY IGG, EIA: Brucella Antibody IgG, EIA: NEGATIVE

## 2024-04-27 LAB — HCV AB W REFLEX TO QUANT PCR: HCV Ab: NONREACTIVE

## 2024-04-27 LAB — ACID FAST SMEAR (AFB, MYCOBACTERIA)
Acid Fast Smear: NEGATIVE
Acid Fast Smear: NEGATIVE

## 2024-04-27 LAB — HCV INTERPRETATION

## 2024-04-27 LAB — BRUCELLA ANTIBODY IGM, EIA: Brucella Antibody IgM, EIA: NEGATIVE

## 2024-04-27 LAB — HEPATITIS B SURFACE ANTIBODY, QUANTITATIVE: Hep B S AB Quant (Post): 384 m[IU]/mL

## 2024-04-27 MED ORDER — DOXYCYCLINE HYCLATE 100 MG PO TABS
100.0000 mg | ORAL_TABLET | Freq: Two times a day (BID) | ORAL | Status: DC
  Administered 2024-04-27 – 2024-05-02 (×11): 100 mg via ORAL
  Filled 2024-04-27 (×11): qty 1

## 2024-04-27 MED ORDER — ALBUMIN HUMAN 25 % IV SOLN
12.5000 g | Freq: Every day | INTRAVENOUS | Status: AC
  Administered 2024-04-27 – 2024-04-29 (×3): 12.5 g via INTRAVENOUS
  Filled 2024-04-27 (×3): qty 50

## 2024-04-27 MED ORDER — TRAZODONE HCL 50 MG PO TABS
50.0000 mg | ORAL_TABLET | Freq: Every evening | ORAL | Status: DC | PRN
  Administered 2024-04-27 – 2024-05-01 (×5): 50 mg via ORAL
  Filled 2024-04-27 (×5): qty 1

## 2024-04-27 NOTE — Plan of Care (Signed)
  Problem: Education: Goal: Knowledge of General Education information will improve Description: Including pain rating scale, medication(s)/side effects and non-pharmacologic comfort measures Outcome: Progressing   Problem: Coping: Goal: Level of anxiety will decrease Outcome: Progressing   Problem: Elimination: Goal: Will not experience complications related to bowel motility Outcome: Progressing Goal: Will not experience complications related to urinary retention Outcome: Progressing   Problem: Safety: Goal: Ability to remain free from injury will improve Outcome: Progressing   Problem: Skin Integrity: Goal: Risk for impaired skin integrity will decrease Outcome: Progressing   

## 2024-04-27 NOTE — Progress Notes (Signed)
 [facility] night cross cover note:   I was notified by the patient's RN of the patient's request for a different sleeping aid.  Specifically, the patient reports that the existing melatonin has been ineffective for him and requests an alternate sleep aid.  Consequently, I discontinued existing order for melatonin and added as needed trazodone  for insomnia.     Eva Pore, DO Hospitalist

## 2024-04-27 NOTE — Progress Notes (Signed)
 Progress Note   Patient: Brandon Dixon FMW:[id number] DOB: [birth year] DOA: 04/19/2024     8 DOS: the patient was seen and examined on 04/27/2024   Brief hospital course: Brief Narrative:  78 y.o. male with hx of PAD, PMR on chronic prednisone , hypertension, hyperlipidemia, prediabetes, BPH, GERD, who is transferred from [facility] ED for community-acquired pneumonia with acute hypoxic respiratory failure.  Ongoing for almost 2 weeks, seen by outpatient PCP on 6/30 and was given IM Rocephin  followed by 10 days of Augmentin .  Augmentin  caused GI upset but went to go see PCP again were noted to be hypoxic therefore sent to the ER.  In the ER noted to have right lower lobe near complete consolidation on the CTA chest.  Started on IV Rocephin  and azithromycin .  Initially also in A-fib with RVR, new onset therefore started on p.o. metoprolol . Hospital course also complicated by fluid overload requiring IV Lasix .  Slowly O2 weaned down.  Due to persistent fever ID and pulmonary performed bronchoscopy 7/8.  Extensive ID workup has been initiated and currently on Zosyn , Doxy to target Brucella, Coxiella per ID.  Assessment & Plan:  Principal Problem:   CAP (community acquired pneumonia) Active Problems:   Acute hypoxic respiratory failure (HCC)   Sepsis (HCC)   A-fib (HCC)   AKI (acute kidney injury) (HCC)   Transaminitis   Hyperglycemia   Community-acquired pneumonia Acute hypoxic respiratory failure, secondary to above, on 8L  Sepsis, secondary to above, present on admission  Pleural effusion Failed outpatient treatment with Rocephin /Augmentin .   CTA shows near right lower lobe complete consolidation and his hypoxic.  Initially worsening oxygen requirement but improved after diuretics.  Due to persistent leukocytosis, CT chest abdomen pelvis performed which does not show any acute pathology.  IR attempted thoracentesis but minimal effusion.  MRSA swab is negative, completed azithromycin .   On IV Zosyn  and doxy added per ID to cover brucella, coxiella.  Hepatitis panel, Q fever, brucella negative, crypto negative. AFB smears 2 negative third pending. BAL, Blood cultures remain negative ID follow up appreciated Status post bronchoscopy 7/8, brushings and BAL-no organisms seen  Atrial fibrillation, new onset, initially RVR now, resolved He is now back in normal sinus rhythm.  Echo unremarkable.  TSH normal.  Hesitant to start any anticoagulation at this time.  Can consider outpatient ambulatory monitor. Continue metoprolol .  Acute congestive heart failure with preserved ejection fraction Now euvolemic.  Echo  shows preserved EF, low dose lasix  20mg  daily ordered. Monitor daily electrolytes, renal function.    Acute kidney injury stage I, resolved Baseline creatinine 0.8, elevated 1.4 on admission.  Creatinine now stable around 1.2   Acute liver injury, mixed pattern AST 110, alk phos 202, other LFT normal.  May be related to underlying systemic illness. - Trend LFT, trending down.  Right upper quadrant ultrasound negative.  Hypoalbuminemia- Encourage oral diet, supplements. 3 days of IV Albumin  ordered.  Essential hypertension Benicar  on hold due to mild AKI, Continue metoprolol  oral, IV Lopressor  as needed   History of prediabetes Hyperglycemia Chronic steroid use Hyperlipidemia Home regimen metformin , hold for now. Accu-Cheks and sliding scale A1c 6.6   Hyponatremia, mild, resolved Resolved and stable  Polymyalgia rheumatica - On prednisone  5 mg daily   Incidental findings: 8 mm nodule in the lingula: Outpatient surveillance imaging      Out of bed to chair. Incentive spirometry. Nursing supportive care. Fall, aspiration precautions. Diet:  Diet Orders (From admission, onward)  Start     Ordered   04/25/24 1214  Diet regular Room service appropriate? Yes; Fluid consistency: Thin  Diet effective now       Question Answer Comment  Room service  appropriate? Yes   Fluid consistency: Thin      04/25/24 1215           DVT prophylaxis: enoxaparin  (LOVENOX ) injection 40 mg Start: 04/20/24 1000  Level of care: Telemetry Medical   Code Status: Full Code  Subjective: Patient is seen and examined today morning. He is sitting in chair. Has poor appetite, Tmax 100.7. Felt short of breath last night currently on 3L supplemental O2.  Physical Exam: Vitals:   04/27/24 0800 04/27/24 0941 04/27/24 1225 04/27/24 1653  BP: (!) 167/85 (!) 167/85 (!) 149/70 (!) 159/81  Pulse: 95 (!) 103 86 89  Resp: 20  20 20   Temp: 98.3 F (36.8 C)  97.6 F (36.4 C) 97.9 F (36.6 C)  TempSrc: Oral  Oral Oral  SpO2: 93%  97% 95%  Weight:      Height:        General - Elderly Caucasian ill looking male, no apparent distress HEENT - PERRLA, EOMI, atraumatic head, non tender sinuses. Lung - Clear, basal rales, diffuse rhonchi, no wheezes. Heart - S1, S2 heard, no murmurs, rubs, trace pedal edema. Abdomen - Soft, non tender, bowel sounds good Neuro - Alert, awake and oriented x 3, non focal exam. Skin - Warm and dry.  Data Reviewed:      Latest Ref Rng & Units 04/27/2024   10:00 AM 04/26/2024    4:36 AM 04/25/2024    3:16 AM  CBC  WBC 4.0 - 10.5 K/uL 22.6  21.7  22.5   Hemoglobin 13.0 - 17.0 g/dL 86.6  87.5  87.4   Hematocrit 39.0 - 52.0 % 39.0  35.8  36.2   Platelets 150 - 400 K/uL 761  636  482       Latest Ref Rng & Units 04/27/2024   10:00 AM 04/25/2024    3:16 AM 04/24/2024    2:42 AM  BMP  Glucose 70 - 99 mg/dL 830  882  875   BUN 8 - 23 mg/dL 15  18  20    Creatinine 0.61 - 1.24 mg/dL 9.01  8.95  8.92   Sodium 135 - 145 mmol/L 135  135  136   Potassium 3.5 - 5.1 mmol/L 4.0  3.8  3.5   Chloride 98 - 111 mmol/L 102  100  100   CO2 22 - 32 mmol/L 22  25  26    Calcium  8.9 - 10.3 mg/dL 8.3  7.8  8.0    DG Chest 2 View Result Date: 04/27/2024 CLINICAL DATA:  Right pneumothorax EXAM: CHEST - 2 VIEW COMPARISON:  Chest radiograph dated  04/20/2024 FINDINGS: Normal lung volumes. Slightly improved right lung aeration with decreased but persistent multifocal consolidations and patchy opacities. Trace right pleural effusion. No pneumothorax. Right heart border remains partially obscured. Left humeral bone anchors. IMPRESSION: 1. Slightly improved right lung aeration with decreased but persistent multifocal consolidations and patchy opacities. 2. Trace right pleural effusion. No pneumothorax. Electronically Signed   By: Limin  Xu M.D.   On: 04/27/2024 10:01   US  Abdomen Limited RUQ (LIVER/GB) Result Date: 04/26/2024 CLINICAL DATA:  Elevated liver enzymes. EXAM: ULTRASOUND ABDOMEN LIMITED RIGHT UPPER QUADRANT COMPARISON:  CT abdomen pelvis dated 04/22/2024. FINDINGS: Gallbladder: No gallstones or wall thickening visualized. No sonographic Murphy sign noted  by sonographer. Common bile duct: Diameter: 3 mm Liver: No focal lesion identified. Within normal limits in parenchymal echogenicity. Portal vein is patent on color Doppler imaging with normal direction of blood flow towards the liver. Other: A small right pleural effusion is partially visualized. IMPRESSION: Right pleural effusion, otherwise unremarkable right upper quadrant ultrasound. Electronically Signed   By: Vanetta Chou M.D.   On: 04/26/2024 10:33    Family Communication: Discussed with patient, family at bedside, they understand and agree. All questions answered.  Disposition: Status is: Inpatient Remains inpatient appropriate because: severity of illness  Planned Discharge Destination: Home     Time spent: 53 minutes  Author: Concepcion Riser, MD 04/27/2024 6:53 PM Secure chat 7am to 7pm For on call review [email or web address].

## 2024-04-27 NOTE — Progress Notes (Signed)
 Occupational Therapy Treatment Patient Details Name: Brandon Dixon MRN: [id number] DOB: April 07, 1946 Today's Date: 04/27/2024   History of present illness Pt is a 78 year old man admitted with CAP, sepsis and respiratory failure with hypoxia on 04/19/24. Hospital course complicated by new onset afib with RVR. PMH: PAD, PMR on chronic prednisone , HTN, HLD, prediabetes, BPH, GERD, B rotator cuff repairs.   OT comments  Pt c/o SOB, started feeling worse last night and this morning, but eager to perform OOB activities and participate. Pt ambulated around room 60 feet, holding onto IV cart, supervision for safety. Pt tolerated well. Able to go to toilet and complete mod I, increased time/effort. O2 on room air at 87% during activity and rest, quicly recovers to above 95% on 2L O2. Pt does well with ADLs and mobility without AD, still significantly limited due to poor activity tolerance and SOB. Will continue to see Pt acutely to progress as able and maximize OOB activity tolerance. No OT follow up expected.       If plan is discharge home, recommend the following:  A little help with walking and/or transfers;A little help with bathing/dressing/bathroom;Assistance with cooking/housework;Assist for transportation;Help with stairs or ramp for entrance   Equipment Recommendations  None recommended by OT    Recommendations for Other Services      Precautions / Restrictions Precautions Precautions: Fall Recall of Precautions/Restrictions: Intact Precaution/Restrictions Comments: airborne precautions Restrictions Weight Bearing Restrictions Per Provider Order: No       Mobility Bed Mobility Overal bed mobility: Modified Independent                  Transfers Overall transfer level: Modified independent                 General transfer comment: mod I, able to move IV cart, tolerates OOB activity well     Balance Overall balance assessment: Modified Independent                                          ADL either performed or assessed with clinical judgement   ADL Overall ADL's : Modified independent                                       General ADL Comments: increased time, no physical assist, able to go to restroom and back on RA, desats to 87 but quickly recovers on 2L O2    Extremity/Trunk Assessment              Vision       Perception     Praxis     Communication Communication Communication: No apparent difficulties   Cognition Arousal: Alert Behavior During Therapy: WFL for tasks assessed/performed Cognition: No apparent impairments                               Following commands: Intact        Cueing   Cueing Techniques: Verbal cues  Exercises      Shoulder Instructions       General Comments      Pertinent Vitals/ Pain       Pain Assessment Pain Assessment: No/denies pain  Home Living  Prior Functioning/Environment              Frequency  Min 2X/week        Progress Toward Goals  OT Goals(current goals can now be found in the care plan section)  Progress towards OT goals: Progressing toward goals  Acute Rehab OT Goals OT Goal Formulation: With patient/family Time For Goal Achievement: 05/06/24 Potential to Achieve Goals: Good ADL Goals Additional ADL Goal #1: Pt will complete basic ADLs mod I. Additional ADL Goal #2: Pt will generalize energy conservation strategies in ADLs and mobility.  Plan      Co-evaluation                 AM-PAC OT 6 Clicks Daily Activity     Outcome Measure   Help from another person eating meals?: None Help from another person taking care of personal grooming?: None Help from another person toileting, which includes using toliet, bedpan, or urinal?: None Help from another person bathing (including washing, rinsing, drying)?: None Help from another person  to put on and taking off regular upper body clothing?: None Help from another person to put on and taking off regular lower body clothing?: None 6 Click Score: 24    End of Session    OT Visit Diagnosis: Unsteadiness on feet (R26.81);Other (comment)   Activity Tolerance Patient tolerated treatment well   Patient Left in chair;with call bell/phone within reach;with family/visitor present   Nurse Communication Mobility status        Time: 8771-8745 OT Time Calculation (min): 26 min  Charges: OT General Charges $OT Visit: 1 Visit OT Treatments $Self Care/Home Management : 8-22 mins $Therapeutic Activity: 8-22 mins  Naseer Hearn, OTR/L   Elouise JONELLE Bott 04/27/2024, 12:59 PM

## 2024-04-27 NOTE — Progress Notes (Signed)
 NAME:  Brandon Dixon, MRN:  [id number], DOB:  [birth year], LOS: 8 ADMISSION DATE:  04/19/2024, CONSULTATION DATE:  04/27/2024  REFERRING MD:  Caleen, TRH, CHIEF COMPLAINT: Pneumonia  History of Present Illness:  78 year old man presented to PCP on 6/30 with cough yellow sputum.  He was treated with IM ceftriaxone  given 10-day course of Augmentin . On follow-up visit 7/2 he was noted to be hypoxic, not able to tolerate Augmentin  due to GI upset and was sent to the ED. ED course -febrile 100.6, leukocytosis 18K , CT angiogram chest showed large area of consolidation involving right middle and lower lobes, small right effusion.  Ultrasound chest on 7/4 only showed trace effusion CT abdomen/pelvis on 7/5 showed consolidation extensively in the right middle and lower lobes, minimal left lower lobe with small right effusion. He had persistent fever and ID was consulted.  Antibiotics were changed to Zosyn  and linezolid  to cover Pseudomonas and MRSA.  Of note RVP and MRSA PCR was negative results of blood cultures. Last fever of 103 is noted on 7/6.  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 rate 20, height 6' (1.829 m), weight 77.2 kg, SpO2 97%.        Intake/Output Summary (Last 24 hours) at 04/27/2024 1307 Last data filed at 04/27/2024 0000 Gross per 24 hour  Intake 374.29 ml  Output --  Net 374.29 ml   Filed  Weights   04/19/24 2050 04/23/24 0643 04/25/24 1015  Weight: 77.9 kg 77.2 kg 77.2 kg    Examination: Gen. Pleasant, well-nourished, in no distress, normal affect ENT - no pallor,icterus, JVD Neck: No JVD, no thyromegaly, no carotid bruits Lungs: D better air entry on right, no accessory muscle use Cardiovascular: S1-S2 regular no murmurs or gallops, no peripheral edema Abdomen: soft and non-tender, no hepatosplenomegaly, BS normal. Musculoskeletal: No deformities, no cyanosis or clubbing Neuro:  alert, non focal   Labs show persistent leukocytosis, normal electrolytes, increasing LFTs Chest x-ray shows improved aeration right lower lobe no significant effusion  Resolved problem list   Assessment and Plan   Severe community-acquired pneumonia Multifocal pneumonia -No organism isolated but clearly appears bacterial given progression on imaging.  He has finally defervesced and repeat CT abdomen also shows improved aeration tracing clinical improvement but he has persistent leukocytosis.  There is no evidence of empyema.  Due to chronic low-dose prednisone , concern for uncommon organism.  Urine strep and Legionella antigen negative -BAL negative cultures and AFB can DC airborne isolation in my opinion - Continue Zosyn , linezolid  can be stopped in my opinion since MRSA PCR negative - No evidence of empyema to explain persistent fever/leukocytosis  Elevated LFTs -unclear cause  Updated wife and daughter at bedside  Best Practice (right click and Reselect all SmartList Selections daily)    Code Status:  full code Last date of multidisciplinary goals of care discussion [NA]  Labs   CBC: Recent Labs  Lab 04/23/24 0245 04/24/24 0242  04/25/24 0316 04/26/24 0436 04/27/24 1000  WBC 24.7* 24.3* 22.5* 21.7* 22.6*  HGB 12.6* 12.6* 12.5* 12.4* 13.3  HCT 36.0* 36.5* 36.2* 35.8* 39.0  MCV 84.1 83.7 83.6 85.6 85.9  PLT 291 376 482* 636* 761*    Basic Metabolic Panel: Recent  Labs  Lab 04/21/24 0853 04/22/24 9367 04/23/24 0245 04/24/24 0242 04/25/24 0316 04/26/24 0436 04/27/24 1000  NA 134* 134* 135 136 135  --  135  K 4.0 3.7 3.6 3.5 3.8  --  4.0  CL 100 98 100 100 100  --  102  CO2 21* 23 25 26 25   --  22  GLUCOSE 120* 115* 96 124* 117*  --  169*  BUN 29* 28* 24* 20 18  --  15  CREATININE 1.16 1.23 1.17 1.07 1.04  --  0.98  CALCIUM  8.3* 8.2* 7.7* 8.0* 7.8*  --  8.3*  MG 2.5* 2.3 2.3 2.3 2.1 2.2 1.9  PHOS 3.6  --   --   --   --   --   --    GFR: Estimated Creatinine Clearance: 67.8 mL/min (by C-G formula based on SCr of 0.98 mg/dL). Recent Labs  Lab 04/22/24 [phone] 04/23/24 0245 04/24/24 0242 04/25/24 0316 04/26/24 0436 04/27/24 1000  PROCALCITON 4.89  --   --   --   --   --   WBC 22.7*   < > 24.3* 22.5* 21.7* 22.6*   < > = values in this interval not displayed.    Liver Function Tests: Recent Labs  Lab 04/22/24 9367 04/23/24 0245 04/24/24 0242 04/25/24 0316 04/27/24 1000  AST 77* 73* 110* 159* 240*  ALT 33 31 40 57* 129*  ALKPHOS 134* 126 139* 137* 218*  BILITOT 0.5 0.9 0.6 0.7 0.7  PROT 6.2* 5.7* 5.9* 5.7* 6.9  ALBUMIN  1.8* 1.7* 1.7* 1.6* 1.8*   No results for input(s): LIPASE, AMYLASE in the last 168 hours. No results for input(s): AMMONIA in the last 168 hours.  ABG    Component Value Date/Time   HCO3 23.7 04/19/2024 1536   TCO2 25 04/19/2024 1536   ACIDBASEDEF 1.0 04/19/2024 1536   O2SAT 32 04/19/2024 1536     Coagulation Profile: No results for input(s): INR, PROTIME in the last 168 hours.  Cardiac Enzymes: No results for input(s): CKTOTAL, CKMB, CKMBINDEX, TROPONINI in the last 168 hours.   HbA1C: Hgb A1c MFr Bld  Date/Time Value Ref Range Status  04/20/2024 02:05 AM 6.6 (H) 4.8 - 5.6 % Final    Comment:    (NOTE)         Prediabetes: 5.7 - 6.4         Diabetes: >6.4         Glycemic control for adults with diabetes: <7.0   12/15/2023 10:39 AM 6.4 4.6 - 6.5 % Final    Comment:     Glycemic Control Guidelines for People with Diabetes:Non Diabetic:  <6%Goal of Therapy: <7%Additional Action Suggested:  >8%     CBG: Recent Labs  Lab 04/26/24 1148 04/26/24 1629 04/26/24 2025 04/27/24 0548 04/27/24 1222  GLUCAP 200* 130* 105* 109* 167*    Harden Staff MD. FCCP. [facility] Pager : [phone]  If no response to pager , please call [phone] until 7 pm After 7:00 pm call Elink  [phone]   04/27/2024

## 2024-04-27 NOTE — Progress Notes (Signed)
 \       Subjective:   He feels worse overnight   Antibiotics:  Anti-infectives (From admission, onward)    Start     Dose/Rate Route Frequency Ordered Stop   04/27/24 1030  doxycycline  (VIBRA -TABS) tablet 100 mg        100 mg Oral Every 12 hours 04/27/24 0931     04/26/24 2200  linezolid  (ZYVOX ) tablet 600 mg  Status:  Discontinued        600 mg Oral Every 12 hours 04/26/24 1524 04/27/24 0931   04/23/24 1230  linezolid  (ZYVOX ) IVPB 600 mg  Status:  Discontinued        600 mg 300 mL/hr over 60 Minutes Intravenous Every 12 hours 04/23/24 1130 04/26/24 1524   04/22/24 1000  piperacillin -tazobactam (ZOSYN ) IVPB 3.375 g        3.375 g 12.5 mL/hr over 240 Minutes Intravenous Every 8 hours 04/22/24 0909     04/20/24 1600  azithromycin  (ZITHROMAX ) tablet 500 mg        500 mg Oral Daily 04/19/24 2233 04/21/24 1507   04/20/24 1600  cefTRIAXone  (ROCEPHIN ) 1 g in sodium chloride  0.9 % 100 mL IVPB  Status:  Discontinued        1 g 200 mL/hr over 30 Minutes Intravenous Every 24 hours 04/19/24 2233 04/22/24 0856   04/19/24 1600  cefTRIAXone  (ROCEPHIN ) 1 g in sodium chloride  0.9 % 100 mL IVPB        1 g 200 mL/hr over 30 Minutes Intravenous  Once 04/19/24 1548 04/19/24 1749   04/19/24 1600  azithromycin  (ZITHROMAX ) 500 mg in sodium chloride  0.9 % 250 mL IVPB        500 mg 250 mL/hr over 60 Minutes Intravenous  Once 04/19/24 1548 04/19/24 1808       Medications: Scheduled Meds:  aspirin  EC  81 mg Oral Daily   atorvastatin   80 mg Oral QPM   doxycycline   100 mg Oral Q12H   enoxaparin  (LOVENOX ) injection  40 mg Subcutaneous Q24H   feeding supplement  1 Container Oral TID BM   guaiFENesin   1,200 mg Oral BID   insulin  aspart  0-6 Units Subcutaneous TID WC   insulin  aspart  3 Units Subcutaneous TID WC   metoprolol  tartrate  25 mg Oral BID   predniSONE   5 mg Oral Q breakfast   sodium chloride  flush  3 mL Intravenous Q12H   Continuous Infusions:  albumin  human 12.5 g (04/27/24 1320)    piperacillin -tazobactam (ZOSYN )  IV 3.375 g (04/27/24 0951)   PRN Meds:.acetaminophen , albuterol , benzonatate , hydrALAZINE , melatonin, metoprolol  tartrate, ondansetron  (ZOFRAN ) IV, polyethylene glycol, senna-docusate    Objective: Weight change:   Intake/Output Summary (Last 24 hours) at 04/27/2024 1654 Last data filed at 04/27/2024 0000 Gross per 24 hour  Intake 374.29 ml  Output --  Net 374.29 ml   Blood pressure (!) 159/81, pulse 89, temperature 97.9 F (36.6 C), temperature source Oral, resp. rate 20, height 6' (1.829 m), weight 77.2 kg, SpO2 95%. Temp:  [97.6 F (36.4 C)-100.7 F (38.2 C)] 97.9 F (36.6 C) (07/10 1653) Pulse Rate:  [84-103] 89 (07/10 1653) Resp:  [20] 20 (07/10 1653) BP: (149-168)/(70-85) 159/81 (07/10 1653) SpO2:  [93 %-97 %] 95 % (07/10 1653)  Physical Exam: Physical Exam Constitutional:      Appearance: He is well-developed.  HENT:     Head: Normocephalic and atraumatic.  Eyes:     Conjunctiva/sclera: Conjunctivae normal.  Cardiovascular:     Rate  and Rhythm: Normal rate and regular rhythm.     Heart sounds: No murmur heard.    No friction rub. No gallop.  Pulmonary:     Effort: Pulmonary effort is normal. No respiratory distress.     Breath sounds: Normal breath sounds. No stridor. No wheezing.  Abdominal:     General: There is no distension.     Palpations: Abdomen is soft.  Musculoskeletal:        General: Normal range of motion.     Cervical back: Normal range of motion and neck supple.  Skin:    General: Skin is warm and dry.     Findings: No erythema or rash.  Neurological:     General: No focal deficit present.     Mental Status: He is alert and oriented to person, place, and time.  Psychiatric:        Mood and Affect: Mood normal.        Behavior: Behavior normal.        Thought Content: Thought content normal.        Judgment: Judgment normal.      CBC:    BMET Recent Labs    04/25/24 0316 04/27/24 1000  NA 135  135  K 3.8 4.0  CL 100 102  CO2 25 22  GLUCOSE 117* 169*  BUN 18 15  CREATININE 1.04 0.98  CALCIUM  7.8* 8.3*     Liver Panel  Recent Labs    04/25/24 0316 04/27/24 1000  PROT 5.7* 6.9  ALBUMIN  1.6* 1.8*  AST 159* 240*  ALT 57* 129*  ALKPHOS 137* 218*  BILITOT 0.7 0.7       Sedimentation Rate No results for input(s): ESRSEDRATE in the last 72 hours. C-Reactive Protein No results for input(s): CRP in the last 72 hours.  Micro Results: Recent Results (from the past 720 hours)  Urine Culture     Status: None   Collection Time: 04/18/24  7:24 AM   Specimen: Urine  Result Value Ref Range Status   MICRO NUMBER: [id number]  Final   SPECIMEN QUALITY: Adequate  Final   Sample Source URINE  Final   STATUS: FINAL  Final   Result: No Growth  Final  Resp panel by RT-PCR (RSV, Flu A&B, Covid) Anterior Nasal Swab     Status: None   Collection Time: 04/19/24  2:46 PM   Specimen: Anterior Nasal Swab  Result Value Ref Range Status   SARS Coronavirus 2 by RT PCR NEGATIVE NEGATIVE Final    Comment: (NOTE) SARS-CoV-2 target nucleic acids are NOT DETECTED.  The SARS-CoV-2 RNA is generally detectable in upper respiratory specimens during the acute phase of infection. The lowest concentration of SARS-CoV-2 viral copies this assay can detect is 138 copies/mL. A negative result does not preclude SARS-Cov-2 infection and should not be used as the sole basis for treatment or other patient management decisions. A negative result may occur with  improper specimen collection/handling, submission of specimen other than nasopharyngeal swab, presence of viral mutation(s) within the areas targeted by this assay, and inadequate number of viral copies(<138 copies/mL). A negative result must be combined with clinical observations, patient history, and epidemiological information. The expected result is Negative.  Fact Sheet for Patients:  BloggerCourse.com  Fact  Sheet for Healthcare Providers:  SeriousBroker.it  This test is no t yet approved or cleared by the [place]  FDA and  has been authorized for detection and/or diagnosis of SARS-CoV-2 by FDA under an Emergency  Use Authorization (EUA). This EUA will remain  in effect (meaning this test can be used) for the duration of the COVID-19 declaration under Section 564(b)(1) of the Act, 21 U.S.C.section 360bbb-3(b)(1), unless the authorization is terminated  or revoked sooner.       Influenza A by PCR NEGATIVE NEGATIVE Final   Influenza B by PCR NEGATIVE NEGATIVE Final    Comment: (NOTE) The Xpert Xpress SARS-CoV-2/FLU/RSV plus assay is intended as an aid in the diagnosis of influenza from Nasopharyngeal swab specimens and should not be used as a sole basis for treatment. Nasal washings and aspirates are unacceptable for Xpert Xpress SARS-CoV-2/FLU/RSV testing.  Fact Sheet for Patients: BloggerCourse.com  Fact Sheet for Healthcare Providers: SeriousBroker.it  This test is not yet approved or cleared by the [place]  FDA and has been authorized for detection and/or diagnosis of SARS-CoV-2 by FDA under an Emergency Use Authorization (EUA). This EUA will remain in effect (meaning this test can be used) for the duration of the COVID-19 declaration under Section 564(b)(1) of the Act, 21 U.S.C. section 360bbb-3(b)(1), unless the authorization is terminated or revoked.     Resp Syncytial Virus by PCR NEGATIVE NEGATIVE Final    Comment: (NOTE) Fact Sheet for Patients: BloggerCourse.com  Fact Sheet for Healthcare Providers: SeriousBroker.it  This test is not yet approved or cleared by the [place]  FDA and has been authorized for detection and/or diagnosis of SARS-CoV-2 by FDA under an Emergency Use Authorization (EUA). This EUA will remain in effect  (meaning this test can be used) for the duration of the COVID-19 declaration under Section 564(b)(1) of the Act, 21 U.S.C. section 360bbb-3(b)(1), unless the authorization is terminated or revoked.  Performed at [facility], [address] [place], [place] [address]   MRSA Next Gen by PCR, Nasal     Status: None   Collection Time: 04/19/24  8:57 PM   Specimen: Nasal Mucosa; Nasal Swab  Result Value Ref Range Status   MRSA by PCR Next Gen NOT DETECTED NOT DETECTED Final    Comment: (NOTE) The GeneXpert MRSA Assay (FDA approved for NASAL specimens only), is one component of a comprehensive MRSA colonization surveillance program. It is not intended to diagnose MRSA infection nor to guide or monitor treatment for MRSA infections. Test performance is not FDA approved in patients less than 49 years old. Performed at [facility], [address], [place], [place] [address]   Culture, blood (Routine X 2) w Reflex to ID Panel     Status: None   Collection Time: 04/20/24  9:05 AM   Specimen: BLOOD  Result Value Ref Range Status   Specimen Description BLOOD SITE NOT SPECIFIED  Final   Special Requests   Final    BOTTLES DRAWN AEROBIC ONLY Blood Culture results may not be optimal due to an inadequate volume of blood received in culture bottles   Culture   Final    NO GROWTH 5 DAYS Performed at [facility], [address], [place], [place] [address]    Report Status 04/25/2024 FINAL  Final  Culture, blood (Routine X 2) w Reflex to ID Panel     Status: None   Collection Time: 04/20/24  1:37 PM   Specimen: BLOOD RIGHT ARM  Result Value Ref Range Status   Specimen Description BLOOD RIGHT ARM  Final   Special Requests   Final    BOTTLES DRAWN AEROBIC AND ANAEROBIC Blood Culture results may not be optimal  due to an inadequate volume of blood received in culture bottles   Culture   Final    NO GROWTH 5 DAYS Performed at [facility], [address],  [place], [place] [address]    Report Status 04/25/2024 FINAL  Final  Expectorated Sputum Assessment w Gram Stain, Rflx to Resp Cult     Status: None   Collection Time: 04/20/24  8:37 PM   Specimen: Expectorated Sputum  Result Value Ref Range Status   Specimen Description EXPECTORATED SPUTUM  Final   Special Requests NONE  Final   Sputum evaluation   Final    Sputum specimen not acceptable for testing.  Please recollect.   Gram Stain Report Called to,Read Back By and Verified With: RN RONAL RADFORD [phone] @2325  FH Performed at [facility], [address] [place], [place] [address]    Report Status 04/20/2024 FINAL  Final  Expectorated Sputum Assessment w Gram Stain, Rflx to Resp Cult     Status: None   Collection Time: 04/21/24  4:00 AM   Specimen: Expectorated Sputum  Result Value Ref Range Status   Specimen Description EXPECTORATED SPUTUM  Final   Special Requests NONE  Final   Sputum evaluation   Final    THIS SPECIMEN IS ACCEPTABLE FOR SPUTUM CULTURE Performed at [facility], [address], [place], [place] [address]    Report Status 04/21/2024 FINAL  Final  Culture, Respiratory w Gram Stain     Status: None   Collection Time: 04/21/24  4:00 AM  Result Value Ref Range Status   Specimen Description EXPECTORATED SPUTUM  Final   Special Requests NONE Reflexed from [id number]  Final   Gram Stain   Final    FEW WBC PRESENT, PREDOMINANTLY PMN FEW BUDDING YEAST SEEN Performed at [facility], [address], [place], [place] [address]    Culture FEW CANDIDA ALBICANS  Final   Report Status 04/23/2024 FINAL  Final  Respiratory (~20 pathogens) panel by PCR     Status: None   Collection Time: 04/22/24  8:57 AM   Specimen: Nasopharyngeal Swab; Respiratory  Result Value Ref Range Status   Adenovirus NOT DETECTED NOT DETECTED Final   Coronavirus 229E NOT DETECTED NOT DETECTED Final    Comment: (NOTE) The Coronavirus on the Respiratory Panel, DOES NOT test for the novel   Coronavirus (2019 nCoV)    Coronavirus HKU1 NOT DETECTED NOT DETECTED Final   Coronavirus NL63 NOT DETECTED NOT DETECTED Final   Coronavirus OC43 NOT DETECTED NOT DETECTED Final   Metapneumovirus NOT DETECTED NOT DETECTED Final   Rhinovirus / Enterovirus NOT DETECTED NOT DETECTED Final   Influenza A NOT DETECTED NOT DETECTED Final   Influenza B NOT DETECTED NOT DETECTED Final   Parainfluenza Virus 1 NOT DETECTED NOT DETECTED Final   Parainfluenza Virus 2 NOT DETECTED NOT DETECTED Final   Parainfluenza Virus 3 NOT DETECTED NOT DETECTED Final   Parainfluenza Virus 4 NOT DETECTED NOT DETECTED Final   Respiratory Syncytial Virus NOT DETECTED NOT DETECTED Final   Bordetella pertussis NOT DETECTED NOT DETECTED Final   Bordetella Parapertussis NOT DETECTED NOT DETECTED Final   Chlamydophila pneumoniae NOT DETECTED NOT DETECTED Final   Mycoplasma pneumoniae NOT DETECTED NOT DETECTED Final    Comment: Performed at [facility], [address], [place], [place] [address]  C Difficile Quick Screen (NO PCR Reflex)     Status: None   Collection Time: 04/23/24 11:38 AM  Specimen: STOOL  Result Value Ref Range Status   C Diff antigen NEGATIVE NEGATIVE Final   C Diff toxin NEGATIVE NEGATIVE Final   C Diff interpretation No C. difficile detected.  Final    Comment: Performed at [facility], [address] [place], [place] [address]  Blastomyces Antigen     Status: None   Collection Time: 04/23/24 11:39 AM   Specimen: Blood  Result Value Ref Range Status   Blastomyces Antigen None Detected None Detected ng/mL Final    Comment: (NOTE) Reference Interval: None Detected Reportable Range: 0.31 ng/mL - 20.00 ng/mL Results above 20.00 ng/mL are reported as 'Positive, Above the Limit of Quantification' This test was developed and its performance characteristics determined by [employer]. It has not been cleared or approved by the FDA; however, FDA clearance or approval  is not currently required for clinical use. The results are not intended to be used as the sole means for clinical diagnosis or patient decisions.    Interpretation Negative  Final   Specimen Type SERUM  Final    Comment: (NOTE) Performed At: [facility] [address] [place], [place] [id number] [place] Pac MD Ey:[id number]   Acid Fast Smear (AFB)     Status: None   Collection Time: 04/24/24  9:08 AM   Specimen: Sputum  Result Value Ref Range Status   AFB Specimen Processing Concentration  Final   Acid Fast Smear Negative  Final    Comment: (NOTE) Performed At: [facility] [address] [place], [place] [id number] Jennette Shorter MD Ey:[id number]    Source (AFB) SPUTUM  Final    Comment: Performed at [facility], [address] [place], [place] [address]  Culture, BAL-quantitative w Gram Stain     Status: None   Collection Time: 04/25/24 10:46 AM   Specimen: Bronchial Alveolar Lavage; Respiratory  Result Value Ref Range Status   Specimen Description BRONCHIAL ALVEOLAR LAVAGE  Final   Special Requests NONE  Final   Gram Stain NO WBC SEEN NO ORGANISMS SEEN   Final   Culture   Final    NO GROWTH 2 DAYS Performed at [facility], [address] [place], [place] [address]    Report Status 04/27/2024 FINAL  Final  Aerobic/Anaerobic Culture w Gram Stain (surgical/deep wound)     Status: None (Preliminary result)   Collection Time: 04/25/24 10:46 AM   Specimen: Bronchial Alveolar Lavage; Respiratory  Result Value Ref Range Status   Specimen Description BRONCHIAL ALVEOLAR LAVAGE  Final   Special Requests NONE  Final   Gram Stain NO WBC SEEN NO ORGANISMS SEEN   Final   Culture   Final    NO GROWTH 2 DAYS NO ANAEROBES ISOLATED; CULTURE IN PROGRESS FOR 5 DAYS Performed at [facility], [address] [place], [place] [address]    Report Status PENDING  Incomplete  Acid Fast Smear (AFB)     Status: None   Collection Time:  04/25/24 10:46 AM   Specimen: Bronchial Alveolar Lavage; Respiratory  Result Value Ref Range Status   AFB Specimen Processing Concentration  Final   Acid Fast Smear Negative  Final    Comment: (NOTE) Performed At: [facility] [address] [place], [place] [id number] Jennette Shorter MD Ey:[id number]    Source (AFB) BRONCHIAL ALVEOLAR LAVAGE  Final    Comment: Performed at [facility], [address] [place], [place] [address]    Studies/Results: ARCOLA  Chest 2 View Result Date: 04/27/2024 CLINICAL DATA:  Right pneumothorax EXAM: CHEST - 2 VIEW COMPARISON:  Chest radiograph dated 04/20/2024 FINDINGS: Normal lung volumes. Slightly improved right lung aeration with decreased but persistent multifocal consolidations and patchy opacities. Trace right pleural effusion. No pneumothorax. Right heart border remains partially obscured. Left humeral bone anchors. IMPRESSION: 1. Slightly improved right lung aeration with decreased but persistent multifocal consolidations and patchy opacities. 2. Trace right pleural effusion. No pneumothorax. Electronically Signed   By: Limin  Xu M.D.   On: 04/27/2024 10:01   US  Abdomen Limited RUQ (LIVER/GB) Result Date: 04/26/2024 CLINICAL DATA:  Elevated liver enzymes. EXAM: ULTRASOUND ABDOMEN LIMITED RIGHT UPPER QUADRANT COMPARISON:  CT abdomen pelvis dated 04/22/2024. FINDINGS: Gallbladder: No gallstones or wall thickening visualized. No sonographic Murphy sign noted by sonographer. Common bile duct: Diameter: 3 mm Liver: No focal lesion identified. Within normal limits in parenchymal echogenicity. Portal vein is patent on color Doppler imaging with normal direction of blood flow towards the liver. Other: A small right pleural effusion is partially visualized. IMPRESSION: Right pleural effusion, otherwise unremarkable right upper quadrant ultrasound. Electronically Signed   By: Vanetta Chou M.D.   On: 04/26/2024 10:33        Assessment/Plan:  INTERVAL HISTORY:   Patient felt clinically worse overnight with worsening cough and malaise chest x-ray done this morning shows improved right aeration and decreased multilobar focal consolidations and patchy opacities with a trace right pleural effusion his transaminases continue to trend upwards.  Right upper quadrant ultrasound was unremarkable   Principal Problem:   Pneumonia of right lower lobe due to infectious organism Active Problems:   Acute hypoxic respiratory failure (HCC)   Sepsis (HCC)   A-fib (HCC)   AKI (acute kidney injury) (HCC)   Transaminitis   Hyperglycemia   Leukocytosis    Brandon Dixon is a 78 y.o. male with polymyalgia rheumatica on chronic prednisone  5 mg/day atrial fibrillation who has been admitted with a nonresolving pneumonia  #1 Slow to resolve pneumonia:   His Brucella IgG and IgM were both negative as acute titers his Q fever i.e. Coxiella IgG was also negative but again this is an acute titer.  Given his history of exposure to cabs being born recently before the onset of his symptoms his leukocytosis and transaminitis and his slow response to conventional antibiotics and the fact that some of the antibiotics he had such as azithromycin  and Zyvox  may have had some activity against Coxiella I think it is reasonable to now target Coxiella with doxycycline  100 mg orally twice daily.  Will continue his Zosyn  in the interim.  He has 2 negative AFB smears and once he has a third we can take him out of airborne isolation   #2 Transaminitis:  LFTs a bit worse today not terrible but certainly not improving.  As above we are pulling the trigger on doxycycline  today.  His hepatitis panel was negative his ultrasound of the liver was unremarkable.  If this continues to go in the wrong direction may be prudent to involve GI though the transaminitis is not profound at this point in time it could worsen   #3 IP In terms of  his isolation precautions he needs to be in airborne till we have 3 negative smears this would not prevent him from going home he could go home but just wear a mask in public until we have 3 negative smears   ,I have personally spent 52 minutes involved in face-to-face  and non-face-to-face activities for this patient on the day of the visit. Professional time spent includes the following activities: Preparing to see the patient (review of tests), Obtaining and/or reviewing separately obtained history (admission/discharge record), Performing a medically appropriate examination and/or evaluation , Ordering medications/tests/procedures, referring and communicating with other health care professionals, Documenting clinical information in the EMR, Independently interpreting results (not separately reported), Communicating results to the patient/family/caregiver, Counseling and educating the patient/family/caregiver and Care coordination (not separately reported).   Evaluation of the patient requires complex antimicrobial therapy evaluation, counseling , isolation needs to reduce disease transmission and risk assessment and mitigation.       LOS: 8 days   Jomarie Fleeta Rothman 04/27/2024, 4:54 PM

## 2024-04-28 DIAGNOSIS — N179 Acute kidney failure, unspecified: Secondary | ICD-10-CM | POA: Diagnosis not present

## 2024-04-28 DIAGNOSIS — I4891 Unspecified atrial fibrillation: Secondary | ICD-10-CM | POA: Diagnosis not present

## 2024-04-28 DIAGNOSIS — D72829 Elevated white blood cell count, unspecified: Secondary | ICD-10-CM | POA: Diagnosis not present

## 2024-04-28 DIAGNOSIS — J189 Pneumonia, unspecified organism: Secondary | ICD-10-CM | POA: Diagnosis not present

## 2024-04-28 LAB — ACID FAST SMEAR (AFB, MYCOBACTERIA): Acid Fast Smear: NEGATIVE

## 2024-04-28 LAB — COMPREHENSIVE METABOLIC PANEL WITH GFR
ALT: 101 U/L — ABNORMAL HIGH (ref 0–44)
AST: 148 U/L — ABNORMAL HIGH (ref 15–41)
Albumin: 1.8 g/dL — ABNORMAL LOW (ref 3.5–5.0)
Alkaline Phosphatase: 175 U/L — ABNORMAL HIGH (ref 38–126)
Anion gap: 12 (ref 5–15)
BUN: 14 mg/dL (ref 8–23)
CO2: 20 mmol/L — ABNORMAL LOW (ref 22–32)
Calcium: 8.1 mg/dL — ABNORMAL LOW (ref 8.9–10.3)
Chloride: 106 mmol/L (ref 98–111)
Creatinine, Ser: 0.86 mg/dL (ref 0.61–1.24)
GFR, Estimated: >60 mL/min (ref >60)
Glucose, Bld: 107 mg/dL — ABNORMAL HIGH (ref 70–99)
Potassium: 4.2 mmol/L (ref 3.5–5.1)
Sodium: 138 mmol/L (ref 135–145)
Total Bilirubin: 0.8 mg/dL (ref 0.0–1.2)
Total Protein: 6.2 g/dL — ABNORMAL LOW (ref 6.5–8.1)

## 2024-04-28 LAB — CBC
HCT: 36.8 % — ABNORMAL LOW (ref 39.0–52.0)
Hemoglobin: 12.3 g/dL — ABNORMAL LOW (ref 13.0–17.0)
MCH: 29.4 pg (ref 26.0–34.0)
MCHC: 33.4 g/dL (ref 30.0–36.0)
MCV: 87.8 fL (ref 80.0–100.0)
Platelets: 641 K/uL — ABNORMAL HIGH (ref 150–400)
RBC: 4.19 MIL/uL — ABNORMAL LOW (ref 4.22–5.81)
RDW: 15.1 % (ref 11.5–15.5)
WBC: 18.7 K/uL — ABNORMAL HIGH (ref 4.0–10.5)
nRBC: 0 % (ref 0.0–0.2)

## 2024-04-28 LAB — GLUCOSE, CAPILLARY
Glucose-Capillary: 101 mg/dL — ABNORMAL HIGH (ref 70–99)
Glucose-Capillary: 181 mg/dL — ABNORMAL HIGH (ref 70–99)
Glucose-Capillary: 209 mg/dL — ABNORMAL HIGH (ref 70–99)
Glucose-Capillary: 129 mg/dL — ABNORMAL HIGH (ref 70–99)

## 2024-04-28 MED ORDER — FUROSEMIDE 40 MG PO TABS
40.0000 mg | ORAL_TABLET | Freq: Every day | ORAL | Status: AC
  Administered 2024-04-28 – 2024-04-30 (×3): 40 mg via ORAL
  Filled 2024-04-28 (×3): qty 1

## 2024-04-28 MED ORDER — IRBESARTAN 75 MG PO TABS
37.5000 mg | ORAL_TABLET | Freq: Every day | ORAL | Status: DC
  Administered 2024-04-28 – 2024-05-02 (×5): 37.5 mg via ORAL
  Filled 2024-04-28 (×5): qty 0.5

## 2024-04-28 NOTE — Progress Notes (Addendum)
 NAME:  NATION CRADLE, MRN:  [id number], DOB:  25-Feb-1946, LOS: 9 ADMISSION DATE:  04/19/2024, CONSULTATION DATE:  04/28/2024  REFERRING MD:  Caleen, TRH, CHIEF COMPLAINT: Pneumonia  History of Present Illness:  78 year old man presented to PCP on 6/30 with cough yellow sputum.  He was treated with IM ceftriaxone  given 10-day course of Augmentin . On follow-up visit 7/2 he was noted to be hypoxic, not able to tolerate Augmentin  due to GI upset and was sent to the ED. ED course -febrile 100.6, leukocytosis 18K , CT angiogram chest showed large area of consolidation involving right middle and lower lobes, small right effusion.  Ultrasound chest on 7/4 only showed trace effusion CT abdomen/pelvis on 7/5 showed consolidation extensively in the right middle and lower lobes, minimal left lower lobe with small right effusion. He had persistent fever and ID was consulted.  Antibiotics were changed to Zosyn  and linezolid  to cover Pseudomonas and MRSA.  Of note RVP and MRSA PCR was negative results of blood cultures. Last fever of 103 is noted on 7/6.  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 rate 20, height 6' (1.829 m), weight 77.2 kg, SpO2 94%.        Intake/Output Summary (Last 24 hours) at 04/28/2024 0935 Last data filed at 04/28/2024 0700 Gross per 24 hour  Intake 573.58 ml  Output  --  Net 573.58 ml   Filed Weights   04/19/24 2050 04/23/24 0643 04/25/24 1015  Weight: 77.9 kg 77.2 kg 77.2 kg    Examination: Gen. Pleasant, well-nourished, in no distress, tired affect ENT - no pallor,icterus, JVD Neck: No JVD, no thyromegaly, no carotid bruits Lungs: Crackles right base,, no accessory muscle use Cardiovascular: S1-S2 regular no murmurs or gallops, no peripheral edema Abdomen: soft and non-tender, no hepatosplenomegaly, BS normal. Musculoskeletal: No deformities, no cyanosis or clubbing Neuro:  alert, non focal   Labs show decreasing leukocytosis, decreasing LFTs, normal electrolytes Chest x-ray shows improved aeration right lower lobe no significant effusion  Resolved problem list   Assessment and Plan   Severe community-acquired pneumonia Multifocal pneumonia -No organism isolated but clearly appears bacterial given progression on imaging.  He has finally defervesced and repeat CT abdomen also shows improved aeration tracing clinical improvement but he has persistent leukocytosis.  There is no evidence of empyema.  Due to chronic low-dose prednisone , concern for uncommon organism.  Urine strep and Legionella antigen negative  -BAL and AFB negative , can DC airborne isolation in my opinion - Continue Zosyn  and Doxy   Elevated LFTs -unclear cause, decreasing now  No need for IV albumin  in my opinion Diurese with 40 mg Lasix  daily for 3 days until pedal edema improved  Updated wife and daughter at bedside PCCM will be available as needed, can follow-up with PCP for chest x-ray resolution  Best Practice (right click and Reselect all SmartList Selections daily)    Code  Status:  full code Last date of multidisciplinary goals of care discussion [NA]  Labs   CBC: Recent Labs  Lab 04/24/24 0242 04/25/24 0316 04/26/24 0436 04/27/24 1000 04/28/24 0305  WBC 24.3* 22.5* 21.7* 22.6* 18.7*  HGB 12.6* 12.5* 12.4* 13.3 12.3*  HCT 36.5* 36.2* 35.8* 39.0  36.8*  MCV 83.7 83.6 85.6 85.9 87.8  PLT 376 482* 636* 761* 641*    Basic Metabolic Panel: Recent Labs  Lab 04/23/24 0245 04/24/24 0242 04/25/24 0316 04/26/24 0436 04/27/24 1000 04/28/24 0305  NA 135 136 135  --  135 138  K 3.6 3.5 3.8  --  4.0 4.2  CL 100 100 100  --  102 106  CO2 25 26 25   --  22 20*  GLUCOSE 96 124* 117*  --  169* 107*  BUN 24* 20 18  --  15 14  CREATININE 1.17 1.07 1.04  --  0.98 0.86  CALCIUM  7.7* 8.0* 7.8*  --  8.3* 8.1*  MG 2.3 2.3 2.1 2.2 1.9  --    GFR: Estimated Creatinine Clearance: 77.3 mL/min (by C-G formula based on SCr of 0.86 mg/dL). Recent Labs  Lab 04/22/24 0632 04/23/24 0245 04/25/24 0316 04/26/24 0436 04/27/24 1000 04/28/24 0305  PROCALCITON 4.89  --   --   --   --   --   WBC 22.7*   < > 22.5* 21.7* 22.6* 18.7*   < > = values in this interval not displayed.    Liver Function Tests: Recent Labs  Lab 04/23/24 0245 04/24/24 0242 04/25/24 0316 04/27/24 1000 04/28/24 0305  AST 73* 110* 159* 240* 148*  ALT 31 40 57* 129* 101*  ALKPHOS 126 139* 137* 218* 175*  BILITOT 0.9 0.6 0.7 0.7 0.8  PROT 5.7* 5.9* 5.7* 6.9 6.2*  ALBUMIN  1.7* 1.7* 1.6* 1.8* 1.8*   No results for input(s): LIPASE, AMYLASE in the last 168 hours. No results for input(s): AMMONIA in the last 168 hours.  ABG    Component Value Date/Time   HCO3 23.7 04/19/2024 1536   TCO2 25 04/19/2024 1536   ACIDBASEDEF 1.0 04/19/2024 1536   O2SAT 32 04/19/2024 1536     Coagulation Profile: No results for input(s): INR, PROTIME in the last 168 hours.  Cardiac Enzymes: No results for input(s): CKTOTAL, CKMB, CKMBINDEX, TROPONINI in the last 168 hours.   HbA1C: Hgb A1c MFr Bld  Date/Time Value Ref Range Status  04/20/2024 02:05 AM 6.6 (H) 4.8 - 5.6 % Final    Comment:    (NOTE)         Prediabetes: 5.7 - 6.4         Diabetes: >6.4         Glycemic control for adults with diabetes: <7.0   12/15/2023 10:39 AM 6.4 4.6 - 6.5 % Final     Comment:    Glycemic Control Guidelines for People with Diabetes:Non Diabetic:  <6%Goal of Therapy: <7%Additional Action Suggested:  >8%     CBG: Recent Labs  Lab 04/27/24 0548 04/27/24 1222 04/27/24 1650 04/27/24 2036 04/28/24 0627  GLUCAP 109* 167* 128* 152* 101*    Harden Staff MD. FCCP. [facility] Pager : [phone]  If no response to pager , please call [phone] until 7 pm After 7:00 pm call Elink  [phone]   04/28/2024

## 2024-04-28 NOTE — Progress Notes (Signed)
 Physical Therapy Treatment Patient Details Name: Brandon Dixon MRN: [id number] DOB: March 22, 1946 Today's Date: 04/28/2024   History of Present Illness Pt is a 78 year old man admitted with CAP, sepsis and respiratory failure with hypoxia on 04/19/24. Hospital course complicated by new onset afib with RVR. PMH: PAD, PMR on chronic prednisone , HTN, HLD, prediabetes, BPH, GERD, B rotator cuff repairs.    PT Comments  Pt is progressing towards goals. Pt is limited by fatigue at this time. Pt supervision to Mod I for sit to stand and gait this session. Pt limited secondary to shortness of breathe and fatigue with education on pacing due to pt trying to increase speed once fatigued. Due to pt current functional status, home set up and available assistance at home no recommended skilled physical therapy services at this time on discharge from acute care hospital setting. Will continue to follow in acute setting in order to ensure that pt returns home with decreased risk for falls, injury, re-hospitalization and improved activity tolerance.      If plan is discharge home, recommend the following: A little help with walking and/or transfers;Assistance with cooking/housework;Assist for transportation;Help with stairs or ramp for entrance     Equipment Recommendations  None recommended by PT       Precautions / Restrictions Precautions Precautions: Fall Recall of Precautions/Restrictions: Intact Restrictions Weight Bearing Restrictions Per Provider Order: No     Mobility  Bed Mobility   General bed mobility comments: sitting in chair on arrival and departure.    Transfers Overall transfer level: Modified independent Equipment used: None Transfers: Sit to/from Stand Sit to Stand: Modified independent (Device/Increase time)           General transfer comment: mod I, assist with lines    Ambulation/Gait Ambulation/Gait assistance: Supervision Gait Distance (Feet): 150 Feet Assistive  device: None Gait Pattern/deviations: Step-through pattern, Decreased stride length Gait velocity: decreased Gait velocity interpretation: 1.31 - 2.62 ft/sec, indicative of limited community ambulator   General Gait Details: HR up to 111 bpm. O2 sats around 93-94% occasionally would drop to 89% with coughing and increase back to 90's after ~20 seconds     Balance Overall balance assessment: Modified Independent Sitting-balance support: Bilateral upper extremity supported, Feet supported Sitting balance-Leahy Scale: Good     Standing balance support: No upper extremity supported, During functional activity Standing balance-Leahy Scale: Fair Standing balance comment: no overt LOB      Communication Communication Communication: No apparent difficulties  Cognition Arousal: Alert Behavior During Therapy: WFL for tasks assessed/performed   PT - Cognitive impairments: No apparent impairments   Following commands: Intact      Cueing Cueing Techniques: Verbal cues     General Comments General comments (skin integrity, edema, etc.): Spouse present during session      Pertinent Vitals/Pain Pain Assessment Pain Assessment: No/denies pain     PT Goals (current goals can now be found in the care plan section) Acute Rehab PT Goals Patient Stated Goal: return to full independence PT Goal Formulation: With patient/family Time For Goal Achievement: 05/06/24 Potential to Achieve Goals: Good Progress towards PT goals: Progressing toward goals    Frequency    Min 2X/week      PT Plan  Continue with current POC        AM-PAC PT 6 Clicks Mobility   Outcome Measure  Help needed turning from your back to your side while in a flat bed without using bedrails?: None Help needed moving from lying  on your back to sitting on the side of a flat bed without using bedrails?: None Help needed moving to and from a bed to a chair (including a wheelchair)?: None Help needed standing  up from a chair using your arms (e.g., wheelchair or bedside chair)?: None Help needed to walk in hospital room?: A Little Help needed climbing 3-5 steps with a railing? : A Little 6 Click Score: 22    End of Session Equipment Utilized During Treatment: Gait belt Activity Tolerance: Patient tolerated treatment well Patient left: with call bell/phone within reach;with family/visitor present;in chair Nurse Communication: Mobility status PT Visit Diagnosis: Unsteadiness on feet (R26.81);Muscle weakness (generalized) (M62.81)     Time: 8997-8974 PT Time Calculation (min) (ACUTE ONLY): 23 min  Charges:    $Therapeutic Activity: 23-37 mins PT General Charges $$ ACUTE PT VISIT: 1 Visit                    Dorothyann Maier, DPT, CLT  Acute Rehabilitation Services Office: [phone] (Secure chat preferred)    Dorothyann VEAR Maier 04/28/2024, 10:34 AM

## 2024-04-28 NOTE — Progress Notes (Signed)
 Mobility Specialist Progress Note;   04/28/24 1403  Mobility  Activity Ambulated independently in hallway  Level of Assistance Standby assist, set-up cues, supervision of patient - no hands on  Assistive Device None  Distance Ambulated (ft) 200 ft  Activity Response Tolerated well  Mobility Referral Yes  Mobility visit 1 Mobility  Mobility Specialist Start Time (ACUTE ONLY) 1403  Mobility Specialist Stop Time (ACUTE ONLY) 1415  Mobility Specialist Time Calculation (min) (ACUTE ONLY) 12 min   Pt in chair upon arrival, agreeable to mobility. Required no physical assistance during ambulation, SV. SPO2 93%> throughout ambulation. No c/o when asked. Pt returned back to chair with all needs met, call bell In reach. Daughter present.   Lauraine Erm Mobility Specialist Please contact via SecureChat or [employer] [phone]

## 2024-04-28 NOTE — Progress Notes (Signed)
 \       Subjective:   He had a better night and feels a bit better this morning compared to yesterday   Antibiotics:  Anti-infectives (From admission, onward)    Start     Dose/Rate Route Frequency Ordered Stop   04/27/24 1030  doxycycline  (VIBRA -TABS) tablet 100 mg        100 mg Oral Every 12 hours 04/27/24 0931     04/26/24 2200  linezolid  (ZYVOX ) tablet 600 mg  Status:  Discontinued        600 mg Oral Every 12 hours 04/26/24 1524 04/27/24 0931   04/23/24 1230  linezolid  (ZYVOX ) IVPB 600 mg  Status:  Discontinued        600 mg 300 mL/hr over 60 Minutes Intravenous Every 12 hours 04/23/24 1130 04/26/24 1524   04/22/24 1000  piperacillin -tazobactam (ZOSYN ) IVPB 3.375 g        3.375 g 12.5 mL/hr over 240 Minutes Intravenous Every 8 hours 04/22/24 0909     04/20/24 1600  azithromycin  (ZITHROMAX ) tablet 500 mg        500 mg Oral Daily 04/19/24 2233 04/21/24 1507   04/20/24 1600  cefTRIAXone  (ROCEPHIN ) 1 g in sodium chloride  0.9 % 100 mL IVPB  Status:  Discontinued        1 g 200 mL/hr over 30 Minutes Intravenous Every 24 hours 04/19/24 2233 04/22/24 0856   04/19/24 1600  cefTRIAXone  (ROCEPHIN ) 1 g in sodium chloride  0.9 % 100 mL IVPB        1 g 200 mL/hr over 30 Minutes Intravenous  Once 04/19/24 1548 04/19/24 1749   04/19/24 1600  azithromycin  (ZITHROMAX ) 500 mg in sodium chloride  0.9 % 250 mL IVPB        500 mg 250 mL/hr over 60 Minutes Intravenous  Once 04/19/24 1548 04/19/24 1808       Medications: Scheduled Meds:  aspirin  EC  81 mg Oral Daily   atorvastatin   80 mg Oral QPM   doxycycline   100 mg Oral Q12H   enoxaparin  (LOVENOX ) injection  40 mg Subcutaneous Q24H   feeding supplement  1 Container Oral TID BM   furosemide   40 mg Oral Daily   guaiFENesin   1,200 mg Oral BID   insulin  aspart  0-6 Units Subcutaneous TID WC   metoprolol  tartrate  25 mg Oral BID   predniSONE   5 mg Oral Q breakfast   sodium chloride  flush  3 mL Intravenous Q12H   Continuous  Infusions:  albumin  human 12.5 g (04/28/24 0919)   piperacillin -tazobactam (ZOSYN )  IV 3.375 g (04/28/24 1013)   PRN Meds:.acetaminophen , albuterol , benzonatate , hydrALAZINE , metoprolol  tartrate, ondansetron  (ZOFRAN ) IV, polyethylene glycol, senna-docusate, traZODone     Objective: Weight change:   Intake/Output Summary (Last 24 hours) at 04/28/2024 1257 Last data filed at 04/28/2024 1005 Gross per 24 hour  Intake 813.58 ml  Output --  Net 813.58 ml   Blood pressure (!) 145/80, pulse 90, temperature 98.2 F (36.8 C), temperature source Oral, resp. rate 19, height 6' (1.829 m), weight 77.2 kg, SpO2 97%. Temp:  [97.9 F (36.6 C)-98.5 F (36.9 C)] 98.2 F (36.8 C) (07/11 1059) Pulse Rate:  [82-108] 90 (07/11 1059) Resp:  [19-20] 19 (07/11 1059) BP: (145-169)/(74-82) 145/80 (07/11 1059) SpO2:  [94 %-97 %] 97 % (07/11 1059)  Physical Exam: Physical Exam Constitutional:      Appearance: He is well-developed.  HENT:     Head: Normocephalic and atraumatic.  Eyes:     Conjunctiva/sclera:  Conjunctivae normal.  Cardiovascular:     Rate and Rhythm: Normal rate and regular rhythm.     Heart sounds: No murmur heard.    No friction rub. No gallop.  Pulmonary:     Effort: Pulmonary effort is normal. No respiratory distress.     Breath sounds: Normal breath sounds. No stridor. No wheezing.  Abdominal:     General: There is no distension.     Palpations: Abdomen is soft.  Musculoskeletal:        General: Normal range of motion.     Cervical back: Normal range of motion and neck supple.  Skin:    General: Skin is warm and dry.     Findings: No erythema or rash.  Neurological:     General: No focal deficit present.     Mental Status: He is alert and oriented to person, place, and time.  Psychiatric:        Mood and Affect: Mood normal.        Behavior: Behavior normal.        Thought Content: Thought content normal.        Judgment: Judgment normal.       CBC:    BMET Recent Labs    04/27/24 1000 04/28/24 0305  NA 135 138  K 4.0 4.2  CL 102 106  CO2 22 20*  GLUCOSE 169* 107*  BUN 15 14  CREATININE 0.98 0.86  CALCIUM  8.3* 8.1*     Liver Panel  Recent Labs    04/27/24 1000 04/28/24 0305  PROT 6.9 6.2*  ALBUMIN  1.8* 1.8*  AST 240* 148*  ALT 129* 101*  ALKPHOS 218* 175*  BILITOT 0.7 0.8       Sedimentation Rate No results for input(s): ESRSEDRATE in the last 72 hours. C-Reactive Protein No results for input(s): CRP in the last 72 hours.  Micro Results: Recent Results (from the past 720 hours)  Urine Culture     Status: None   Collection Time: 04/18/24  7:24 AM   Specimen: Urine  Result Value Ref Range Status   MICRO NUMBER: [id number]  Final   SPECIMEN QUALITY: Adequate  Final   Sample Source URINE  Final   STATUS: FINAL  Final   Result: No Growth  Final  Resp panel by RT-PCR (RSV, Flu A&B, Covid) Anterior Nasal Swab     Status: None   Collection Time: 04/19/24  2:46 PM   Specimen: Anterior Nasal Swab  Result Value Ref Range Status   SARS Coronavirus 2 by RT PCR NEGATIVE NEGATIVE Final    Comment: (NOTE) SARS-CoV-2 target nucleic acids are NOT DETECTED.  The SARS-CoV-2 RNA is generally detectable in upper respiratory specimens during the acute phase of infection. The lowest concentration of SARS-CoV-2 viral copies this assay can detect is 138 copies/mL. A negative result does not preclude SARS-Cov-2 infection and should not be used as the sole basis for treatment or other patient management decisions. A negative result may occur with  improper specimen collection/handling, submission of specimen other than nasopharyngeal swab, presence of viral mutation(s) within the areas targeted by this assay, and inadequate number of viral copies(<138 copies/mL). A negative result must be combined with clinical observations, patient history, and epidemiological information. The expected result is  Negative.  Fact Sheet for Patients:  BloggerCourse.com  Fact Sheet for Healthcare Providers:  SeriousBroker.it  This test is no t yet approved or cleared by the [place]  FDA and  has been authorized for detection  and/or diagnosis of SARS-CoV-2 by FDA under an Emergency Use Authorization (EUA). This EUA will remain  in effect (meaning this test can be used) for the duration of the COVID-19 declaration under Section 564(b)(1) of the Act, 21 U.S.C.section 360bbb-3(b)(1), unless the authorization is terminated  or revoked sooner.       Influenza A by PCR NEGATIVE NEGATIVE Final   Influenza B by PCR NEGATIVE NEGATIVE Final    Comment: (NOTE) The Xpert Xpress SARS-CoV-2/FLU/RSV plus assay is intended as an aid in the diagnosis of influenza from Nasopharyngeal swab specimens and should not be used as a sole basis for treatment. Nasal washings and aspirates are unacceptable for Xpert Xpress SARS-CoV-2/FLU/RSV testing.  Fact Sheet for Patients: BloggerCourse.com  Fact Sheet for Healthcare Providers: SeriousBroker.it  This test is not yet approved or cleared by the [place]  FDA and has been authorized for detection and/or diagnosis of SARS-CoV-2 by FDA under an Emergency Use Authorization (EUA). This EUA will remain in effect (meaning this test can be used) for the duration of the COVID-19 declaration under Section 564(b)(1) of the Act, 21 U.S.C. section 360bbb-3(b)(1), unless the authorization is terminated or revoked.     Resp Syncytial Virus by PCR NEGATIVE NEGATIVE Final    Comment: (NOTE) Fact Sheet for Patients: BloggerCourse.com  Fact Sheet for Healthcare Providers: SeriousBroker.it  This test is not yet approved or cleared by the [place]  FDA and has been authorized for detection and/or diagnosis of  SARS-CoV-2 by FDA under an Emergency Use Authorization (EUA). This EUA will remain in effect (meaning this test can be used) for the duration of the COVID-19 declaration under Section 564(b)(1) of the Act, 21 U.S.C. section 360bbb-3(b)(1), unless the authorization is terminated or revoked.  Performed at [facility], [address] [place], [place] [address]   MRSA Next Gen by PCR, Nasal     Status: None   Collection Time: 04/19/24  8:57 PM   Specimen: Nasal Mucosa; Nasal Swab  Result Value Ref Range Status   MRSA by PCR Next Gen NOT DETECTED NOT DETECTED Final    Comment: (NOTE) The GeneXpert MRSA Assay (FDA approved for NASAL specimens only), is one component of a comprehensive MRSA colonization surveillance program. It is not intended to diagnose MRSA infection nor to guide or monitor treatment for MRSA infections. Test performance is not FDA approved in patients less than 43 years old. Performed at [facility], [address], [place], [place] [address]   Culture, blood (Routine X 2) w Reflex to ID Panel     Status: None   Collection Time: 04/20/24  9:05 AM   Specimen: BLOOD  Result Value Ref Range Status   Specimen Description BLOOD SITE NOT SPECIFIED  Final   Special Requests   Final    BOTTLES DRAWN AEROBIC ONLY Blood Culture results may not be optimal due to an inadequate volume of blood received in culture bottles   Culture   Final    NO GROWTH 5 DAYS Performed at [facility], [address] [place], [place] [address]    Report Status 04/25/2024 FINAL  Final  Culture, blood (Routine X 2) w Reflex to ID Panel     Status: None   Collection Time: 04/20/24  1:37 PM   Specimen: BLOOD RIGHT ARM  Result Value Ref Range Status   Specimen Description BLOOD RIGHT ARM  Final   Special Requests   Final    BOTTLES DRAWN AEROBIC  AND ANAEROBIC Blood Culture results may not be optimal due to an inadequate volume of blood received in culture bottles    Culture   Final    NO GROWTH 5 DAYS Performed at [facility], [address] [place], [place] [address]    Report Status 04/25/2024 FINAL  Final  Expectorated Sputum Assessment w Gram Stain, Rflx to Resp Cult     Status: None   Collection Time: 04/20/24  8:37 PM   Specimen: Expectorated Sputum  Result Value Ref Range Status   Specimen Description EXPECTORATED SPUTUM  Final   Special Requests NONE  Final   Sputum evaluation   Final    Sputum specimen not acceptable for testing.  Please recollect.   Gram Stain Report Called to,Read Back By and Verified With: RN RONAL RADFORD [phone] @2325  FH Performed at [facility], [address] [place], [place] [address]    Report Status 04/20/2024 FINAL  Final  Expectorated Sputum Assessment w Gram Stain, Rflx to Resp Cult     Status: None   Collection Time: 04/21/24  4:00 AM   Specimen: Expectorated Sputum  Result Value Ref Range Status   Specimen Description EXPECTORATED SPUTUM  Final   Special Requests NONE  Final   Sputum evaluation   Final    THIS SPECIMEN IS ACCEPTABLE FOR SPUTUM CULTURE Performed at [facility], [address] [place], [place] [address]    Report Status 04/21/2024 FINAL  Final  Culture, Respiratory w Gram Stain     Status: None   Collection Time: 04/21/24  4:00 AM  Result Value Ref Range Status   Specimen Description EXPECTORATED SPUTUM  Final   Special Requests NONE Reflexed from [id number]  Final   Gram Stain   Final    FEW WBC PRESENT, PREDOMINANTLY PMN FEW BUDDING YEAST SEEN Performed at [facility], [address] [place], [place] [address]    Culture FEW CANDIDA ALBICANS  Final   Report Status 04/23/2024 FINAL  Final  Respiratory (~20 pathogens) panel by PCR     Status: None   Collection Time: 04/22/24  8:57 AM   Specimen: Nasopharyngeal Swab; Respiratory  Result Value Ref Range Status   Adenovirus NOT DETECTED NOT DETECTED Final   Coronavirus 229E NOT DETECTED NOT DETECTED Final     Comment: (NOTE) The Coronavirus on the Respiratory Panel, DOES NOT test for the novel  Coronavirus (2019 nCoV)    Coronavirus HKU1 NOT DETECTED NOT DETECTED Final   Coronavirus NL63 NOT DETECTED NOT DETECTED Final   Coronavirus OC43 NOT DETECTED NOT DETECTED Final   Metapneumovirus NOT DETECTED NOT DETECTED Final   Rhinovirus / Enterovirus NOT DETECTED NOT DETECTED Final   Influenza A NOT DETECTED NOT DETECTED Final   Influenza B NOT DETECTED NOT DETECTED Final   Parainfluenza Virus 1 NOT DETECTED NOT DETECTED Final   Parainfluenza Virus 2 NOT DETECTED NOT DETECTED Final   Parainfluenza Virus 3 NOT DETECTED NOT DETECTED Final   Parainfluenza Virus 4 NOT DETECTED NOT DETECTED Final   Respiratory Syncytial Virus NOT DETECTED NOT DETECTED Final   Bordetella pertussis NOT DETECTED NOT DETECTED Final   Bordetella Parapertussis NOT DETECTED NOT DETECTED Final   Chlamydophila pneumoniae NOT DETECTED NOT DETECTED Final   Mycoplasma pneumoniae NOT DETECTED NOT DETECTED Final    Comment: Performed at [facility], [address], [place], [place] [address]  C Difficile Quick Screen (NO PCR Reflex)  Status: None   Collection Time: 04/23/24 11:38 AM   Specimen: STOOL  Result Value Ref Range Status   C Diff antigen NEGATIVE NEGATIVE Final   C Diff toxin NEGATIVE NEGATIVE Final   C Diff interpretation No C. difficile detected.  Final    Comment: Performed at [facility], [address] [place] [address], [place], [place] [address]  Blastomyces Antigen     Status: None   Collection Time: 04/23/24 11:39 AM   Specimen: Blood  Result Value Ref Range Status   Blastomyces Antigen None Detected None Detected ng/mL Final    Comment: (NOTE) Reference Interval: None Detected Reportable Range: 0.31 ng/mL - 20.00 ng/mL Results above 20.00 ng/mL are reported as 'Positive, Above the Limit of Quantification' This test was developed and its performance characteristics determined by [employer]. It  has not been cleared or approved by the FDA; however, FDA clearance or approval is not currently required for clinical use. The results are not intended to be used as the sole means for clinical diagnosis or patient decisions.    Interpretation Negative  Final   Specimen Type SERUM  Final    Comment: (NOTE) Performed At: [facility] [address] [place], [place] [id number] Charleston Pac MD Ey:[id number]   Acid Fast Smear (AFB)     Status: None   Collection Time: 04/24/24  9:08 AM   Specimen: Sputum  Result Value Ref Range Status   AFB Specimen Processing Concentration  Final   Acid Fast Smear Negative  Final    Comment: (NOTE) Performed At: [facility] [address] [place], [place] [id number] Jennette Shorter MD Ey:[id number]    Source (AFB) SPUTUM  Final    Comment: Performed at [facility], [address], [place], [place] [address]  Culture, BAL-quantitative w Gram Stain     Status: None   Collection Time: 04/25/24 10:46 AM   Specimen: Bronchial Alveolar Lavage; Respiratory  Result Value Ref Range Status   Specimen Description BRONCHIAL ALVEOLAR LAVAGE  Final   Special Requests NONE  Final   Gram Stain NO WBC SEEN NO ORGANISMS SEEN   Final   Culture   Final    NO GROWTH 2 DAYS Performed at [facility], [address], [place], [place] [address]    Report Status 04/27/2024 FINAL  Final  Aerobic/Anaerobic Culture w Gram Stain (surgical/deep wound)     Status: None (Preliminary result)   Collection Time: 04/25/24 10:46 AM   Specimen: Bronchial Alveolar Lavage; Respiratory  Result Value Ref Range Status   Specimen Description BRONCHIAL ALVEOLAR LAVAGE  Final   Special Requests NONE  Final   Gram Stain NO WBC SEEN NO ORGANISMS SEEN   Final   Culture   Final    NO GROWTH 3 DAYS NO ANAEROBES ISOLATED; CULTURE IN PROGRESS FOR 5 DAYS Performed at [facility], [address] [place], [place] [address]    Report Status  PENDING  Incomplete  Acid Fast Smear (AFB)     Status: None   Collection Time: 04/25/24 10:46 AM   Specimen: Bronchial Alveolar Lavage; Respiratory  Result Value Ref Range Status   AFB Specimen Processing Concentration  Final   Acid Fast Smear Negative  Final    Comment: (NOTE) Performed At: [facility] [address] [place], [place] [id number] Jennette Shorter MD Ey:[id number]    Source (AFB) BRONCHIAL ALVEOLAR LAVAGE  Final    Comment: Performed at [facility], [address] [place], [place] [address]  Acid Fast Smear (AFB)     Status: None   Collection Time: 04/26/24  6:58 AM   Specimen: Sputum  Result Value Ref Range Status   AFB Specimen Processing Concentration  Final   Acid Fast Smear Negative  Final    Comment: (NOTE) Performed At: [facility] [address] [place], [place] [id number] Jennette Shorter MD Ey:[id number]    Source (AFB) SPU  Final    Comment: Performed at [facility], [address] [place], [place] [address]    Studies/Results: DG Chest 2 View Result Date: 04/27/2024 CLINICAL DATA:  Right pneumothorax EXAM: CHEST - 2 VIEW COMPARISON:  Chest radiograph dated 04/20/2024 FINDINGS: Normal lung volumes. Slightly improved right lung aeration with decreased but persistent multifocal consolidations and patchy opacities. Trace right pleural effusion. No pneumothorax. Right heart border remains partially obscured. Left humeral bone anchors. IMPRESSION: 1. Slightly improved right lung aeration with decreased but persistent multifocal consolidations and patchy opacities. 2. Trace right pleural effusion. No pneumothorax. Electronically Signed   By: Limin  Xu M.D.   On: 04/27/2024 10:01       Assessment/Plan:  INTERVAL HISTORY:   Doxycycline  started yesterday for possible Coxiella LFTs have improved and white count for what is worth  Principal Problem:   Pneumonia of right lower lobe due to infectious organism Active Problems:    Acute hypoxic respiratory failure (HCC)   Sepsis (HCC)   A-fib (HCC)   AKI (acute kidney injury) (HCC)   Transaminitis   Hyperglycemia   Leukocytosis   PMR (polymyalgia rheumatica) (HCC)    Brandon Dixon is a 78 y.o. male with polymyalgia rheumatica on chronic prednisone  5 mg/day atrial fibrillation who has been admitted with a nonresolving pneumonia  #1 Slow to resolve pneumonia:   His Brucella IgG and IgM were both negative as acute titers his Q fever i.e. Coxiella IgG was also negative but again this is an acute titer.  Given his history I we felt that it was a compelling enough story with his exposure history or history has unexplained transaminitis persistent leukocytosis to give him the first-line therapy for Coxiella with doxycycline  100 mg twice daily for 14 days he has now received sufficient Zosyn  and we will stop that  He has 3 negative AFB smears he can come out of airborne isolation  #2 Transaminitis:  This is one of the reasons we decided to treat for Coxiella.  It could be due to other things as well but not viral hepatitides and it is improving   #3 IP DC airborne  I have personally spent 50 minutes involved in face-to-face and non-face-to-face activities for this patient on the day of the visit. Professional time spent includes the following activities: Preparing to see the patient (review of tests), Obtaining and/or reviewing separately obtained history (admission/discharge record), Performing a medically appropriate examination and/or evaluation , Ordering medications/tests/procedures, referring and communicating with other health care professionals, Documenting clinical information in the EMR, Independently interpreting results (not separately reported), Communicating results to the patient/family/caregiver, Counseling and educating the patient/family/caregiver and Care coordination (not separately reported).   Evaluation of the patient requires complex  antimicrobial therapy evaluation, counseling , isolation needs to reduce disease transmission and risk assessment and mitigation.   I will sign off for now  I have made a follow-up appointment in my clinic where we could check convalescent titers for Coxiella.  This is not absolutely necessary but would be interesting to  see if that is indeed what he has had during this hospitalization.   Brandon Dixon has an appointment on 05/18/2024 at 9AM with Dr. Fleeta Rothman  at  [facility], which  is located in the [facility] at  [address] [place] in [place].  [address], which is located to the left of the elevators.  Phone: [phone]  Fax: [phone]  [email or web address]  The patient should arrive 30 minutes prior to their appoitment.      LOS: 9 days   Jomarie Fleeta Rothman 04/28/2024, 12:57 PM

## 2024-04-28 NOTE — Progress Notes (Signed)
 Progress Note   Patient: Brandon Dixon FMW:[id number] DOB: November 03, 1945 DOA: 04/19/2024     9 DOS: the patient was seen and examined on 04/28/2024   Brief hospital course: Brief Narrative:  78 y.o. male with hx of PAD, PMR on chronic prednisone , hypertension, hyperlipidemia, prediabetes, BPH, GERD, who is transferred from [facility] ED for community-acquired pneumonia with acute hypoxic respiratory failure.  Ongoing for almost 2 weeks, seen by outpatient PCP on 6/30 and was given IM Rocephin  followed by 10 days of Augmentin .  Augmentin  caused GI upset but went to go see PCP again were noted to be hypoxic therefore sent to the ER.  In the ER noted to have right lower lobe near complete consolidation on the CTA chest.  Started on IV Rocephin  and azithromycin .  Initially also in A-fib with RVR, new onset therefore started on p.o. metoprolol . Hospital course also complicated by fluid overload requiring IV Lasix .  Slowly O2 weaned down.  Due to persistent fever ID and pulmonary performed bronchoscopy 7/8.  Extensive ID workup has been initiated and currently on Zosyn , Doxy to target Brucella, Coxiella per ID.  Assessment & Plan:  Principal Problem:   CAP (community acquired pneumonia) Active Problems:   Acute hypoxic respiratory failure (HCC)   Sepsis (HCC)   A-fib (HCC)   AKI (acute kidney injury) (HCC)   Transaminitis   Hyperglycemia   Community-acquired pneumonia Acute hypoxic respiratory failure, secondary to above, on 8L  Sepsis, secondary to above, present on admission  Pleural effusion Failed outpatient treatment with Rocephin /Augmentin .   CTA shows near right lower lobe complete consolidation and his hypoxic.  Initially worsening oxygen requirement but improved after diuretics.  Due to persistent leukocytosis, CT chest abdomen pelvis performed which does not show any acute pathology.  IR attempted thoracentesis but minimal effusion.  MRSA swab is negative, completed azithromycin .   Was on Zosyn + Linezolid .  Status post bronchoscopy 7/8, brushings and BAL-no organisms seen. RVP, COVID/flu/RSV are negative.  C. difficile-negative HIV, urine Legionella, urine strep, cryptococcal antigen-negative Hepatitis panel, Q fever, brucella negative, crypto negative. AFB smears x3 negative, isolation discontinued.  BAL, Blood cultures remained negative Stop IV Zosyn  and continue doxy 100  mg bid for 14 days per ID. Follow WBC, LFT if trending plan to discharge him tomorrow. Follow ID clinic to check convalescent titers for Coxiella.  Atrial fibrillation, new onset, initially RVR now, resolved He is now back in normal sinus rhythm.  Echo unremarkable.  TSH normal.  Hesitant to start any anticoagulation at this time.  Can consider outpatient ambulatory monitor. Continue metoprolol .  Acute congestive heart failure with preserved ejection fraction Now euvolemic. Echo  shows preserved EF, low dose lasix  20mg  dailyx 2 more days ordered along with albumin . Monitor daily electrolytes, renal function.    Acute kidney injury stage I, resolved Baseline creatinine 0.8, elevated 1.4 on admission.  Creatinine now stable around baseline.   Acute liver injury, mixed pattern AST, ALT, Alk phos gradually trending down. May be related to underlying infection, systemic illness vs antibiotics Right upper quadrant ultrasound negative. Hep panel negative.  Hypoalbuminemia- Encourage oral diet, supplements. 2 more days of IV Albumin .  Essential hypertension Benicar  resumed, continue metoprolol  oral, IV Lopressor  as needed   History of prediabetes Hyperglycemia Home regimen metformin , hold for now. A1C 6.6 Accu-Cheks and sliding scale for now, eating poor. Hypoglycemia protocol.   Hyponatremia, mild, resolved Resolved and stable.  Hyperlipidemia - Hold statin due to elevated LFT.  Polymyalgia rheumatica Chronic steroid  use Continue prednisone  5 mg daily   Incidental findings: 8 mm  nodule in the lingula: Outpatient surveillance imaging      Out of bed to chair. Incentive spirometry. Nursing supportive care. Fall, aspiration precautions. Diet:  Diet Orders (From admission, onward)     Start     Ordered   04/25/24 1214  Diet regular Room service appropriate? Yes; Fluid consistency: Thin  Diet effective now       Question Answer Comment  Room service appropriate? Yes   Fluid consistency: Thin      04/25/24 1215           DVT prophylaxis: enoxaparin  (LOVENOX ) injection 40 mg Start: 04/20/24 1000  Level of care: Telemetry Medical   Code Status: Full Code  Subjective: Patient is seen and examined today morning. He is sitting in chair. Did work with PT. Off supplemental oxygen. Encouraged incentive spirometry. Feels better, appetite improved.  Physical Exam: Vitals:   04/28/24 0754 04/28/24 0906 04/28/24 1059 04/28/24 1300  BP: (!) 159/82 (!) 150/74 (!) 145/80   Pulse: 94 (!) 108 90 93  Resp: 20  19   Temp: 98.5 F (36.9 C)  98.2 F (36.8 C)   TempSrc: Oral  Oral   SpO2: 94%  97% 96%  Weight:    74.5 kg  Height:        General - Elderly Caucasian male, no apparent distress HEENT - PERRLA, EOMI, atraumatic head, non tender sinuses. Lung - Clear, basal rales, diffuse rhonchi, no wheezes. Heart - S1, S2 heard, no murmurs, rubs, trace pedal edema. Abdomen - Soft, non tender, bowel sounds good Neuro - Alert, awake and oriented x 3, non focal exam. Skin - Warm and dry.  Data Reviewed:      Latest Ref Rng & Units 04/28/2024    3:05 AM 04/27/2024   10:00 AM 04/26/2024    4:36 AM  CBC  WBC 4.0 - 10.5 K/uL 18.7  22.6  21.7   Hemoglobin 13.0 - 17.0 g/dL 87.6  86.6  87.5   Hematocrit 39.0 - 52.0 % 36.8  39.0  35.8   Platelets 150 - 400 K/uL 641  761  636       Latest Ref Rng & Units 04/28/2024    3:05 AM 04/27/2024   10:00 AM 04/25/2024    3:16 AM  BMP  Glucose 70 - 99 mg/dL 892  830  882   BUN 8 - 23 mg/dL 14  15  18    Creatinine 0.61 - 1.24  mg/dL 9.13  9.01  8.95   Sodium 135 - 145 mmol/L 138  135  135   Potassium 3.5 - 5.1 mmol/L 4.2  4.0  3.8   Chloride 98 - 111 mmol/L 106  102  100   CO2 22 - 32 mmol/L 20  22  25    Calcium  8.9 - 10.3 mg/dL 8.1  8.3  7.8    DG Chest 2 View Result Date: 04/27/2024 CLINICAL DATA:  Right pneumothorax EXAM: CHEST - 2 VIEW COMPARISON:  Chest radiograph dated 04/20/2024 FINDINGS: Normal lung volumes. Slightly improved right lung aeration with decreased but persistent multifocal consolidations and patchy opacities. Trace right pleural effusion. No pneumothorax. Right heart border remains partially obscured. Left humeral bone anchors. IMPRESSION: 1. Slightly improved right lung aeration with decreased but persistent multifocal consolidations and patchy opacities. 2. Trace right pleural effusion. No pneumothorax. Electronically Signed   By: Limin  Xu M.D.   On: 04/27/2024 10:01  Family Communication: Discussed with patient, family at bedside, they understand and agree. All questions answered.  Disposition: Status is: Inpatient Remains inpatient appropriate because: severity of illness  Planned Discharge Destination: Home     Time spent: 52 minutes  Author: Concepcion Riser, MD 04/28/2024 3:36 PM Secure chat 7am to 7pm For on call review [email or web address].

## 2024-04-28 NOTE — Plan of Care (Signed)
  Problem: Health Behavior/Discharge Planning: Goal: Ability to manage health-related needs will improve Outcome: Progressing   Problem: Clinical Measurements: Goal: Ability to maintain clinical measurements within normal limits will improve Outcome: Progressing Goal: Diagnostic test results will improve Outcome: Progressing Goal: Respiratory complications will improve Outcome: Progressing   Problem: Activity: Goal: Risk for activity intolerance will decrease Outcome: Progressing   Problem: Nutrition: Goal: Adequate nutrition will be maintained Outcome: Progressing   Problem: Safety: Goal: Ability to remain free from injury will improve Outcome: Progressing

## 2024-04-29 DIAGNOSIS — J189 Pneumonia, unspecified organism: Secondary | ICD-10-CM | POA: Diagnosis not present

## 2024-04-29 LAB — GLUCOSE, CAPILLARY
Glucose-Capillary: 110 mg/dL — ABNORMAL HIGH (ref 70–99)
Glucose-Capillary: 178 mg/dL — ABNORMAL HIGH (ref 70–99)
Glucose-Capillary: 136 mg/dL — ABNORMAL HIGH (ref 70–99)
Glucose-Capillary: 166 mg/dL — ABNORMAL HIGH (ref 70–99)

## 2024-04-29 LAB — CBC
HCT: 35.9 % — ABNORMAL LOW (ref 39.0–52.0)
Hemoglobin: 12.1 g/dL — ABNORMAL LOW (ref 13.0–17.0)
MCH: 28.7 pg (ref 26.0–34.0)
MCHC: 33.7 g/dL (ref 30.0–36.0)
MCV: 85.1 fL (ref 80.0–100.0)
Platelets: 746 K/uL — ABNORMAL HIGH (ref 150–400)
RBC: 4.22 MIL/uL (ref 4.22–5.81)
RDW: 15 % (ref 11.5–15.5)
WBC: 17.5 K/uL — ABNORMAL HIGH (ref 4.0–10.5)
nRBC: 0 % (ref 0.0–0.2)

## 2024-04-29 LAB — COMPREHENSIVE METABOLIC PANEL WITH GFR
ALT: 86 U/L — ABNORMAL HIGH (ref 0–44)
AST: 96 U/L — ABNORMAL HIGH (ref 15–41)
Albumin: 2 g/dL — ABNORMAL LOW (ref 3.5–5.0)
Alkaline Phosphatase: 166 U/L — ABNORMAL HIGH (ref 38–126)
Anion gap: 10 (ref 5–15)
BUN: 13 mg/dL (ref 8–23)
CO2: 23 mmol/L (ref 22–32)
Calcium: 8.5 mg/dL — ABNORMAL LOW (ref 8.9–10.3)
Chloride: 104 mmol/L (ref 98–111)
Creatinine, Ser: 0.86 mg/dL (ref 0.61–1.24)
GFR, Estimated: >60 mL/min (ref >60)
Glucose, Bld: 112 mg/dL — ABNORMAL HIGH (ref 70–99)
Potassium: 4 mmol/L (ref 3.5–5.1)
Sodium: 137 mmol/L (ref 135–145)
Total Bilirubin: 0.7 mg/dL (ref 0.0–1.2)
Total Protein: 6.4 g/dL — ABNORMAL LOW (ref 6.5–8.1)

## 2024-04-29 MED ORDER — HYDRALAZINE HCL 20 MG/ML IJ SOLN: 10.0000 mg | Freq: Four times a day (QID) | INTRAMUSCULAR | Status: DC | PRN

## 2024-04-29 NOTE — Progress Notes (Signed)
 Mobility Specialist Progress Note:  Nurse requested Mobility Specialist to perform oxygen saturation test with pt which includes removing pt from oxygen both at rest and while ambulating.  Below are the results from that testing.     Patient Saturations on Room Air at Rest = spO2 97%  Patient Saturations on Room Air while Ambulating = sp02 93% .  Rested and performed pursed lip breathing for 1 minute with sp02 at 97%.  At end of testing pt left in room on 0 Liters of oxygen.  Reported results to nurse.    Lavanda Pollack Mobility Specialist  Please contact via [employer] or  Rehab Office [phone]

## 2024-04-29 NOTE — Progress Notes (Signed)
 Progress Note   Patient: Brandon Dixon FMW:[id number] DOB: 19-Sep-1946 DOA: 04/19/2024     10 DOS: the patient was seen and examined on 04/29/2024   Brief hospital course: 78 y.o. male with hx of PAD, PMR on chronic prednisone , hypertension, hyperlipidemia, prediabetes, BPH, GERD, who is transferred from [facility] ED for community-acquired pneumonia with acute hypoxic respiratory failure.  Ongoing for almost 2 weeks, seen by outpatient PCP on 6/30 and was given IM Rocephin  followed by 10 days of Augmentin .  Augmentin  caused GI upset but went to go see PCP again were noted to be hypoxic therefore sent to the ER.  In the ER noted to have right lower lobe near complete consolidation on the CTA chest.  Started on IV Rocephin  and azithromycin .  Initially also in A-fib with RVR, new onset therefore started on p.o. metoprolol . Hospital course also complicated by fluid overload requiring IV Lasix .  Slowly O2 weaned down.  Due to persistent fever ID and pulmonary performed bronchoscopy 7/8.  Extensive ID workup has been initiated and currently on Zosyn , Doxy to target Brucella, Coxiella per ID.    Assessment and Plan:    Principal Problem:   CAP (community acquired pneumonia) Active Problems:   Acute hypoxic respiratory failure (HCC)   Sepsis (HCC)   A-fib (HCC)   AKI (acute kidney injury) (HCC)   Transaminitis   Hyperglycemia   Community-acquired pneumonia Acute hypoxic respiratory failure, secondary to above, on 8L  Sepsis, secondary to above, present on admission  Pleural effusion Failed outpatient treatment with Rocephin /Augmentin .   CTA showed near right lower lobe complete consolidation and he was hypoxic.   Initially worsening oxygen requirement but improved after diuretics.  Due to persistent leukocytosis, CT chest abdomen pelvis performed which did not show any acute pathology.  IR attempted thoracentesis but minimal effusion.  MRSA swab was negative, completed azithromycin .   Was on Zosyn + Linezolid .  Status post bronchoscopy 7/8, brushings and BAL-no organisms seen. RVP, COVID/flu/RSV are negative.  C. difficile-negative HIV, urine Legionella, urine strep, cryptococcal antigen-negative Hepatitis panel, Q fever, brucella negative, crypto negative. AFB smears x3 negative, isolation discontinued.  BAL, Blood cultures remained negative Stop IV Zosyn  and continue doxy 100  mg bid for 14 days per ID. Patient has been weaned off oxygen and is now on room air Leukocytosis shows a downward trend but not normalized yet Follow ID clinic to check convalescent titers for Coxiella.   Atrial fibrillation, new onset, initially RVR now, resolved He is now back in normal sinus rhythm.  Echo unremarkable.  TSH normal.  Hesitant to start any anticoagulation at this time.  Can consider outpatient ambulatory monitor. Continue metoprolol .   Acute congestive heart failure with preserved ejection fraction Now euvolemic. Echo  shows preserved EF, low dose lasix  20mg  dailyx 2 more days ordered along with albumin . Monitor daily electrolytes, renal function.    Acute kidney injury stage I, resolved Baseline creatinine 0.8, elevated 1.4 on admission.  Creatinine now stable around baseline.   Acute liver injury, mixed pattern AST, ALT, Alk phos gradually trending down. May be related to underlying infection, systemic illness vs antibiotics Right upper quadrant ultrasound negative. Hep panel negative.   Hypoalbuminemia- Encourage oral diet, supplements. 2 more days of IV Albumin .   Essential hypertension Benicar  resumed, continue metoprolol  oral, IV Lopressor  as needed   History of prediabetes Hyperglycemia Home regimen metformin , hold for now. A1C 6.6 Accu-Cheks and sliding scale for now, eating poor. Hypoglycemia protocol.   Hyponatremia, mild,  resolved Resolved and stable.   Hyperlipidemia - Hold statin due to elevated LFT.   Polymyalgia rheumatica Chronic steroid  use Continue prednisone  5 mg daily   Incidental findings: 8 mm nodule in the lingula: Outpatient surveillance imaging          Subjective: No new complaints.  Ambulated with physical therapy.  Off oxygen  Physical Exam: Vitals:   04/28/24 2304 04/29/24 0553 04/29/24 0741 04/29/24 1125  BP: (!) 165/73 (!) 161/90 (!) 170/76 (!) 147/76  Pulse: 94 97 88 84  Resp: 20 20 20 20   Temp: 98.7 F (37.1 C) 98.1 F (36.7 C) 98.1 F (36.7 C) 98.4 F (36.9 C)  TempSrc: Oral Oral Oral Oral  SpO2: 92% 91% 93% 99%  Weight:      Height:       General - Elderly  male, no apparent distress, appears stated age HEENT - PERRLA, EOMI, atraumatic head, non tender sinuses. Lung -bilateral air entry, basal rales, diffuse rhonchi, no wheezes. Heart - S1, S2 heard, no murmurs, rubs, trace pedal edema. Abdomen - Soft, non tender, bowel sounds good Neuro - Alert, awake and oriented x 3, non focal exam.  Weak Skin - Warm and dry.    Data Reviewed: White count 17.5, hemoglobin 12.1, hematocrit 35.9, platelet count 746, AST 96, ALT 86 Labs reviewed  Family Communication: Plan of care discussed with patient and his wife at the bedside.  All questions and concerns have been addressed.  Disposition: Status is: Inpatient Remains inpatient appropriate because: Improving.  Possible discharge in 24 to 48 hours  Planned Discharge Destination: Home    Time spent: 35 minutes  Author: Aimee Somerset, MD 04/29/2024 11:58 AM  For on call review [email or web address].

## 2024-04-29 NOTE — Progress Notes (Signed)
 Mobility Specialist Progress Note:    04/29/24 1106  Mobility  Activity Ambulated with assistance in hallway  Level of Assistance Standby assist, set-up cues, supervision of patient - no hands on  Assistive Device None  Distance Ambulated (ft) 400 ft  Activity Response Tolerated well  Mobility Referral Yes  Mobility visit 1 Mobility  Mobility Specialist Start Time (ACUTE ONLY) 0920  Mobility Specialist Stop Time (ACUTE ONLY) 0933  Mobility Specialist Time Calculation (min) (ACUTE ONLY) 13 min   Received pt in chair and agreeable to mobility. No physical assistance needed. No c/o throughout. Ambulated on RA, SPO2 stayed between 93%-98%. Returned to room without fault. Pt left in chair with personal belongings and call light within reach. All needs met.   Lavanda Pollack Mobility Specialist  Please contact via [employer] or  Rehab Office [phone]

## 2024-04-29 NOTE — Plan of Care (Signed)

## 2024-04-30 DIAGNOSIS — J189 Pneumonia, unspecified organism: Secondary | ICD-10-CM | POA: Diagnosis not present

## 2024-04-30 LAB — CBC
HCT: 39.2 % (ref 39.0–52.0)
Hemoglobin: 13.1 g/dL (ref 13.0–17.0)
MCH: 29.3 pg (ref 26.0–34.0)
MCHC: 33.4 g/dL (ref 30.0–36.0)
MCV: 87.7 fL (ref 80.0–100.0)
Platelets: 765 K/uL — ABNORMAL HIGH (ref 150–400)
RBC: 4.47 MIL/uL (ref 4.22–5.81)
RDW: 15.2 % (ref 11.5–15.5)
WBC: 15.5 K/uL — ABNORMAL HIGH (ref 4.0–10.5)
nRBC: 0 % (ref 0.0–0.2)

## 2024-04-30 LAB — AEROBIC/ANAEROBIC CULTURE W GRAM STAIN (SURGICAL/DEEP WOUND)
Culture: NO GROWTH
Gram Stain: NONE SEEN

## 2024-04-30 LAB — COMPREHENSIVE METABOLIC PANEL WITH GFR
ALT: 70 U/L — ABNORMAL HIGH (ref 0–44)
AST: 68 U/L — ABNORMAL HIGH (ref 15–41)
Albumin: 2.3 g/dL — ABNORMAL LOW (ref 3.5–5.0)
Alkaline Phosphatase: 172 U/L — ABNORMAL HIGH (ref 38–126)
Anion gap: 9 (ref 5–15)
BUN: 17 mg/dL (ref 8–23)
CO2: 23 mmol/L (ref 22–32)
Calcium: 8.8 mg/dL — ABNORMAL LOW (ref 8.9–10.3)
Chloride: 103 mmol/L (ref 98–111)
Creatinine, Ser: 0.88 mg/dL (ref 0.61–1.24)
GFR, Estimated: >60 mL/min (ref >60)
Glucose, Bld: 118 mg/dL — ABNORMAL HIGH (ref 70–99)
Potassium: 4.4 mmol/L (ref 3.5–5.1)
Sodium: 135 mmol/L (ref 135–145)
Total Bilirubin: 0.7 mg/dL (ref 0.0–1.2)
Total Protein: 6.9 g/dL (ref 6.5–8.1)

## 2024-04-30 LAB — GLUCOSE, CAPILLARY
Glucose-Capillary: 112 mg/dL — ABNORMAL HIGH (ref 70–99)
Glucose-Capillary: 117 mg/dL — ABNORMAL HIGH (ref 70–99)
Glucose-Capillary: 141 mg/dL — ABNORMAL HIGH (ref 70–99)
Glucose-Capillary: 149 mg/dL — ABNORMAL HIGH (ref 70–99)

## 2024-04-30 NOTE — Progress Notes (Signed)
 Mobility Specialist Progress Note:    04/30/24 1601  Mobility  Activity Ambulated with assistance in hallway  Level of Assistance Standby assist, set-up cues, supervision of patient - no hands on  Assistive Device None  Distance Ambulated (ft) 1100 ft  Activity Response Tolerated well  Mobility Referral Yes  Mobility visit 1 Mobility  Mobility Specialist Start Time (ACUTE ONLY) 1508  Mobility Specialist Stop Time (ACUTE ONLY) 1524  Mobility Specialist Time Calculation (min) (ACUTE ONLY) 16 min   Received pt in chair and agreeable to mobility. No physical assistance required. No c/o throughout. Returned pt to room without fault. Left pt in chair with personal belongings and call light within reach. All needs met.   Lavanda Pollack Mobility Specialist  Please contact via [employer] or  Rehab Office [phone]

## 2024-04-30 NOTE — Plan of Care (Signed)

## 2024-04-30 NOTE — Progress Notes (Signed)
 Progress Note   Patient: Brandon Dixon FMW:[id number] DOB: [birth year] DOA: 04/19/2024     11 DOS: the patient was seen and examined on 04/30/2024   Brief hospital course: As per prior documentation: 78 y.o. male with hx of PAD, PMR on chronic prednisone , hypertension, hyperlipidemia, prediabetes, BPH, GERD, who is transferred from [facility] ED for community-acquired pneumonia with acute hypoxic respiratory failure.  Ongoing for almost 2 weeks, seen by outpatient PCP on 6/30 and was given IM Rocephin  followed by 10 days of Augmentin .  Augmentin  caused GI upset but went to go see PCP again were noted to be hypoxic therefore sent to the ER.  In the ER noted to have right lower lobe near complete consolidation on the CTA chest.  Started on IV Rocephin  and azithromycin .  Initially also in A-fib with RVR, new onset therefore started on p.o. metoprolol .  Hospital course also complicated by fluid overload requiring IV Lasix .  Slowly O2 weaned down.  Due to persistent fever ID and pulmonary performed bronchoscopy 7/8.  Extensive ID workup has been initiated and currently on Zosyn , Doxy to target Brucella, Coxiella per ID.  04/30/2024: Patient seen.  Patient continues to cough up yellowish sputum, otherwise, no new complaints.  Patient is currently on only doxycycline  (to complete 2-week course).  Patient has completed course of Zosyn .  Input from infectious disease team is already appreciated.    Assessment and Plan:    Principal Problem:   CAP (community acquired pneumonia) Active Problems:   Acute hypoxic respiratory failure (HCC)   Sepsis (HCC)   A-fib (HCC)   AKI (acute kidney injury) (HCC)   Transaminitis   Hyperglycemia   Community-acquired pneumonia Acute hypoxic respiratory failure, secondary to above, on 8L  Sepsis, secondary to above, present on admission  Pleural effusion Failed outpatient treatment with Rocephin /Augmentin .   CTA showed near right lower lobe complete  consolidation and he was hypoxic.   Initially worsening oxygen requirement but improved after diuretics.  Due to persistent leukocytosis, CT chest abdomen pelvis performed which did not show any acute pathology.  IR attempted thoracentesis but minimal effusion.  MRSA swab was negative, completed azithromycin .  Was on Zosyn + Linezolid .  Status post bronchoscopy 7/8, brushings and BAL-no organisms seen. RVP, COVID/flu/RSV are negative.  C. difficile-negative HIV, urine Legionella, urine strep, cryptococcal antigen-negative Hepatitis panel, Q fever, brucella negative, crypto negative. AFB smears x3 negative, isolation discontinued.  BAL, Blood cultures remained negative Stop IV Zosyn  and continue doxy 100  mg bid for 14 days per ID. Patient has been weaned off oxygen and is now on room air Leukocytosis shows a downward trend but not normalized yet Follow ID clinic to check convalescent titers for Coxiella. 04/30/2024: Brucella IgG and IgM said to be negative, and Coxiella IgG was also said to be negative.  Patient will complete 2-week course of doxycycline .   Atrial fibrillation, new onset, initially RVR now, resolved He is now back in normal sinus rhythm.  Echo unremarkable.  TSH normal.  Hesitant to start any anticoagulation at this time.  Can consider outpatient ambulatory monitor. Continue metoprolol . 04/30/2024: Patient is on metoprolol  25 Mg p.o. twice daily.   Acute congestive heart failure with preserved ejection fraction Now euvolemic. Echo  shows preserved EF, low dose lasix  20mg  dailyx 2 more days ordered along with albumin . Monitor daily electrolytes, renal function. 04/30/2024: Patient is euvolemic.  Lasix  is on hold.  BMP done today reveals sodium of 135, potassium of 4.4, BUN of 17, serum  creatinine of 0.88 and blood sugar of 118.  LFTs revealed alkaline phosphatase of 172, albumin  of 2.3, AST of 68 and ALT of 70.   Acute kidney injury stage I, resolved Baseline creatinine 0.8,  elevated 1.4 on admission.  Creatinine now stable around baseline.   Acute liver injury, mixed pattern AST, ALT, Alk phos gradually trending down. May be related to underlying infection, systemic illness vs antibiotics Right upper quadrant ultrasound negative. Hep panel negative.   Hypoalbuminemia- Encourage oral diet, supplements. -May have prognostic implication.   Essential hypertension Benicar  resumed, continue metoprolol  oral Blood pressure is controlled.    History of prediabetes Hyperglycemia -Metformin  is on hold.   - A1c of 6.6%.   - Patient is on subcutaneous NovoLog  0 to 6 units 3 times daily with meals.  H   Hyponatremia, mild, resolved Resolved and stable.   Hyperlipidemia - Hold statin due to elevated LFT.   Polymyalgia rheumatica Chronic steroid use Continue prednisone  5 mg daily   Incidental findings: 8 mm nodule in the lingula: Outpatient surveillance imaging          Subjective:  - Patient continues to cough up yellow sputum.    Physical Exam: Vitals:   04/30/24 0605 04/30/24 0709 04/30/24 1126 04/30/24 1500  BP: (!) 155/80 (!) 159/75 (!) 141/64 116/65  Pulse: 88 86 80 90  Resp: 20 20 18 20   Temp: 98 F (36.7 C) 98.9 F (37.2 C) 99.5 F (37.5 C) 98.5 F (36.9 C)  TempSrc: Oral Oral Oral Oral  SpO2: 95% 96% 99% 96%  Weight:      Height:       General -not in any distress.  Awake and alert.  Continues to cough.  HEENT -mild pallor.  No jaundice.   Lung -clear to auscultation. Heart - S1, S2  Abdomen - Soft, non tender Neuro -awake and alert.  Data Reviewed: White count 17.5, hemoglobin 12.1, hematocrit 35.9, platelet count 746, AST 96, ALT 86 Labs reviewed  Family Communication:.  Disposition: Status is: Inpatient Remains inpatient appropriate because: Improving.  Possible discharge in 24 to 48 hours  Planned Discharge Destination: Home    Time spent: 35 minutes  Author: Leatrice LILLETTE Chapel, MD 04/30/2024 3:18 PM  For  on call review [email or web address].

## 2024-05-01 DIAGNOSIS — J189 Pneumonia, unspecified organism: Secondary | ICD-10-CM | POA: Diagnosis not present

## 2024-05-01 LAB — COMPREHENSIVE METABOLIC PANEL WITH GFR
ALT: 62 U/L — ABNORMAL HIGH (ref 0–44)
AST: 58 U/L — ABNORMAL HIGH (ref 15–41)
Albumin: 2.3 g/dL — ABNORMAL LOW (ref 3.5–5.0)
Alkaline Phosphatase: 166 U/L — ABNORMAL HIGH (ref 38–126)
Anion gap: 9 (ref 5–15)
BUN: 16 mg/dL (ref 8–23)
CO2: 25 mmol/L (ref 22–32)
Calcium: 8.8 mg/dL — ABNORMAL LOW (ref 8.9–10.3)
Chloride: 103 mmol/L (ref 98–111)
Creatinine, Ser: 0.88 mg/dL (ref 0.61–1.24)
GFR, Estimated: >60 mL/min (ref >60)
Glucose, Bld: 128 mg/dL — ABNORMAL HIGH (ref 70–99)
Potassium: 4.3 mmol/L (ref 3.5–5.1)
Sodium: 137 mmol/L (ref 135–145)
Total Bilirubin: 0.7 mg/dL (ref 0.0–1.2)
Total Protein: 6.9 g/dL (ref 6.5–8.1)

## 2024-05-01 LAB — CBC WITH DIFFERENTIAL/PLATELET
Abs Immature Granulocytes: 0.1 K/uL — ABNORMAL HIGH (ref 0.00–0.07)
Basophils Absolute: 0.2 K/uL — ABNORMAL HIGH (ref 0.0–0.1)
Basophils Relative: 1 %
Eosinophils Absolute: 0.4 K/uL (ref 0.0–0.5)
Eosinophils Relative: 4 %
HCT: 41 % (ref 39.0–52.0)
Hemoglobin: 13.7 g/dL (ref 13.0–17.0)
Immature Granulocytes: 1 %
Lymphocytes Relative: 15 %
Lymphs Abs: 1.8 K/uL (ref 0.7–4.0)
MCH: 29.4 pg (ref 26.0–34.0)
MCHC: 33.4 g/dL (ref 30.0–36.0)
MCV: 88 fL (ref 80.0–100.0)
Monocytes Absolute: 1.2 K/uL — ABNORMAL HIGH (ref 0.1–1.0)
Monocytes Relative: 9 %
Neutro Abs: 8.9 K/uL — ABNORMAL HIGH (ref 1.7–7.7)
Neutrophils Relative %: 70 %
Platelets: 809 K/uL — ABNORMAL HIGH (ref 150–400)
RBC: 4.66 MIL/uL (ref 4.22–5.81)
RDW: 15.3 % (ref 11.5–15.5)
WBC: 12.6 K/uL — ABNORMAL HIGH (ref 4.0–10.5)
nRBC: 0.2 % (ref 0.0–0.2)

## 2024-05-01 LAB — GLUCOSE, CAPILLARY
Glucose-Capillary: 113 mg/dL — ABNORMAL HIGH (ref 70–99)
Glucose-Capillary: 126 mg/dL — ABNORMAL HIGH (ref 70–99)
Glucose-Capillary: 167 mg/dL — ABNORMAL HIGH (ref 70–99)
Glucose-Capillary: 153 mg/dL — ABNORMAL HIGH (ref 70–99)

## 2024-05-01 NOTE — Progress Notes (Signed)
 Mobility Specialist Progress Note;   05/01/24 0912  Mobility  Activity Ambulated independently in hallway  Level of Assistance Standby assist, set-up cues, supervision of patient - no hands on  Assistive Device None  Distance Ambulated (ft) 400 ft  Activity Response Tolerated well  Mobility Referral Yes  Mobility visit 1 Mobility  Mobility Specialist Start Time (ACUTE ONLY) 0912  Mobility Specialist Stop Time (ACUTE ONLY) 0924  Mobility Specialist Time Calculation (min) (ACUTE ONLY) 12 min    During-mobility: HR up to 110 bpm; SPO2 93-98%  Pt eager for mobility. Required no physical assistance during ambulation, SV. VSS throughout and only c/o swollen feet. Pt returned back to room and left with all needs met. Wife assisting pt w/ ADLs.   Lauraine Erm Mobility Specialist Please contact via [employer] or [employer] [phone]

## 2024-05-01 NOTE — Progress Notes (Signed)
 Progress Note   Patient: Brandon Dixon FMW:[id number] DOB: [birth year] DOA: 04/19/2024     12 DOS: the patient was seen and examined on 05/01/2024   Brief hospital course: As per prior documentation: 78 y.o. male with hx of PAD, PMR on chronic prednisone , hypertension, hyperlipidemia, prediabetes, BPH, GERD, who is transferred from [facility] ED for community-acquired pneumonia with acute hypoxic respiratory failure.  Ongoing for almost 2 weeks, seen by outpatient PCP on 6/30 and was given IM Rocephin  followed by 10 days of Augmentin .  Augmentin  caused GI upset but went to go see PCP again were noted to be hypoxic therefore sent to the ER.  In the ER noted to have right lower lobe near complete consolidation on the CTA chest.  Started on IV Rocephin  and azithromycin .  Initially also in A-fib with RVR, new onset therefore started on p.o. metoprolol .  Hospital course also complicated by fluid overload requiring IV Lasix .  Slowly O2 weaned down.  Due to persistent fever ID and pulmonary performed bronchoscopy 7/8.  Extensive ID workup has been initiated and currently on Zosyn , Doxy to target Brucella, Coxiella per ID.  04/30/2024: Patient seen.  Patient continues to cough up yellowish sputum, otherwise, no new complaints.  Patient is currently on only doxycycline  (to complete 2-week course).  Patient has completed course of Zosyn .  Input from infectious disease team is already appreciated.    05/01/2024: Patient seen.  Patient's wife updated.  Leukocytosis continues to improve.  WBC is down to the 12's.  Worsening leukocytosis noted (likely acute phase reactant).  Continue to monitor WBC and platelet count.  Continue doxycycline .  Infectious disease input is highly appreciated.  Assessment and Plan:    Principal Problem:   CAP (community acquired pneumonia) Active Problems:   Acute hypoxic respiratory failure (HCC)   Sepsis (HCC)   A-fib (HCC)   AKI (acute kidney injury) (HCC)    Transaminitis   Hyperglycemia   Community-acquired pneumonia: Acute hypoxic respiratory failure, secondary to above, on supplemental oxygen:   Sepsis, secondary to above, present on admission: Pleural effusion: -Patient failed outpatient treatment with Rocephin /Augmentin .    -CTA revealed large area of consolidation involving the right lung, most consistent with pneumonia.   -Due to persistent leukocytosis, CT chest abdomen pelvis performed which did not reveal any acute pathology.  IR attempted thoracentesis but minimal effusion.  MRSA swab was negative, completed azithromycin .  Was initially on Zosyn  and linezolid . -Status post bronchoscopy (7/8), brushings and BAL-no organisms seen. RVP, COVID/flu/RSV are negative.  C. difficile-negative HIV, urine Legionella, urine strep, cryptococcal antigen-negative Hepatitis panel, Q fever, brucella negative, crypto negative. AFB smears x3 negative, isolation discontinued.  BAL, Blood cultures remained negative -Stop IV Zosyn  and continue doxy 100  mg bid for 14 days per ID. - Patient has been weaned off oxygen and is now on room air -Leukocytosis shows a downward trend but not normalized yet (WBC of 12+) -Follow up with  ID clinic to check convalescent titers for Coxiella.   Atrial fibrillation, new onset, initially RVR now, resolved -Patient is back to normal sinus rhythm. - Echocardiogram is nonrevealing. - Normal TSH. - Continue metoprolol  25 Mg p.o. twice daily. -Consider cardiology referral on discharge.   Acute congestive heart failure with preserved ejection fraction -CHF improved with diuretics.   -Now euvolemic, despite ankle edema reported today (ankle edema is likely secondary to hypoalbuminemia).-Echo is not revealing. -Monitor daily electrolytes, renal function. - CMP done today revealed sodium of 137, potassium of  4.3, chloride 103, CO2 25, BUN of 16, serum creatinine 0.88, blood sugar of 126, Kaleab phosphatase of 166, albumin  of  2.3, AST of 58, ALT of 62 and blood sugar of 128.   Acute kidney injury stage I, resolved Baseline creatinine 0.8, elevated 1.4 on admission.   Creatinine now stable around baseline.   Acute liver injury, mixed pattern AST, ALT, Alk phos gradually trending down. May be related to underlying infection, systemic illness vs antibiotics Right upper quadrant ultrasound negative. Hep panel negative.   Hypoalbuminemia- Encourage oral diet, supplements. -May have prognostic implication.   Essential hypertension Benicar  resumed, continue metoprolol  oral Blood pressure is controlled.    History of prediabetes Hyperglycemia -Metformin  is on hold.   - A1c of 6.6%.   - Patient is on subcutaneous NovoLog  0 to 6 units 3 times daily with meals.  H   Hyponatremia, mild, resolved Resolved and stable.   Hyperlipidemia - Hold statin due to elevated LFT.   Polymyalgia rheumatica Chronic steroid use Continue prednisone  5 mg daily   Incidental findings: 8 mm nodule in the lingula: Outpatient surveillance imaging          Subjective:  - Patient continues to cough up yellow sputum.    Physical Exam: Vitals:   05/01/24 0624 05/01/24 0718 05/01/24 1126 05/01/24 1700  BP: 134/70 134/76 (!) 126/56 108/60  Pulse: 96 87 86 96  Resp: 19 20 20 20   Temp: 98.3 F (36.8 C) 98.3 F (36.8 C) 98.3 F (36.8 C) 98 F (36.7 C)  TempSrc: Oral Oral Oral Oral  SpO2: 99% 99% 99% 98%  Weight:      Height:       General -not in any distress.  Awake and alert.  Continues to cough.  HEENT -mild pallor.  No jaundice.   Lung -clear to auscultation. Heart - S1, S2  Abdomen - Soft, non tender Neuro -awake and alert.  Data Reviewed: White count 17.5, hemoglobin 12.1, hematocrit 35.9, platelet count 746, AST 96, ALT 86 Labs reviewed  Family Communication:.  Disposition: Status is: Inpatient Remains inpatient appropriate because: Improving.  Possible discharge in 24 to 48 hours  Planned  Discharge Destination: Home    Time spent: 35 minutes  Author: Leatrice LILLETTE Chapel, MD 05/01/2024 5:35 PM  For on call review [email or web address].

## 2024-05-02 DIAGNOSIS — J189 Pneumonia, unspecified organism: Secondary | ICD-10-CM | POA: Diagnosis not present

## 2024-05-02 LAB — COMPREHENSIVE METABOLIC PANEL WITH GFR
ALT: 63 U/L — ABNORMAL HIGH (ref 0–44)
AST: 63 U/L — ABNORMAL HIGH (ref 15–41)
Albumin: 2.3 g/dL — ABNORMAL LOW (ref 3.5–5.0)
Alkaline Phosphatase: 159 U/L — ABNORMAL HIGH (ref 38–126)
Anion gap: 9 (ref 5–15)
BUN: 17 mg/dL (ref 8–23)
CO2: 26 mmol/L (ref 22–32)
Calcium: 9.2 mg/dL (ref 8.9–10.3)
Chloride: 103 mmol/L (ref 98–111)
Creatinine, Ser: 0.83 mg/dL (ref 0.61–1.24)
GFR, Estimated: >60 mL/min (ref >60)
Glucose, Bld: 119 mg/dL — ABNORMAL HIGH (ref 70–99)
Potassium: 4.6 mmol/L (ref 3.5–5.1)
Sodium: 138 mmol/L (ref 135–145)
Total Bilirubin: 0.6 mg/dL (ref 0.0–1.2)
Total Protein: 7.1 g/dL (ref 6.5–8.1)

## 2024-05-02 LAB — CBC WITH DIFFERENTIAL/PLATELET
Abs Immature Granulocytes: 0.08 K/uL — ABNORMAL HIGH (ref 0.00–0.07)
Basophils Absolute: 0.2 K/uL — ABNORMAL HIGH (ref 0.0–0.1)
Basophils Relative: 2 %
Eosinophils Absolute: 0.4 K/uL (ref 0.0–0.5)
Eosinophils Relative: 4 %
HCT: 40.3 % (ref 39.0–52.0)
Hemoglobin: 13.3 g/dL (ref 13.0–17.0)
Immature Granulocytes: 1 %
Lymphocytes Relative: 16 %
Lymphs Abs: 1.6 K/uL (ref 0.7–4.0)
MCH: 29.2 pg (ref 26.0–34.0)
MCHC: 33 g/dL (ref 30.0–36.0)
MCV: 88.6 fL (ref 80.0–100.0)
Monocytes Absolute: 1.1 K/uL — ABNORMAL HIGH (ref 0.1–1.0)
Monocytes Relative: 11 %
Neutro Abs: 6.9 K/uL (ref 1.7–7.7)
Neutrophils Relative %: 66 %
Platelets: 715 K/uL — ABNORMAL HIGH (ref 150–400)
RBC: 4.55 MIL/uL (ref 4.22–5.81)
RDW: 15.3 % (ref 11.5–15.5)
WBC: 10.2 K/uL (ref 4.0–10.5)
nRBC: 0 % (ref 0.0–0.2)

## 2024-05-02 LAB — GLUCOSE, CAPILLARY: Glucose-Capillary: 214 mg/dL — ABNORMAL HIGH (ref 70–99)

## 2024-05-02 MED ORDER — DOXYCYCLINE HYCLATE 100 MG PO TABS: 100.0000 mg | ORAL_TABLET | Freq: Two times a day (BID) | ORAL | 0 refills | Status: AC

## 2024-05-02 MED ORDER — GUAIFENESIN-DM 100-10 MG/5ML PO SYRP: 10.0000 mL | ORAL_SOLUTION | Freq: Three times a day (TID) | ORAL | 0 refills | Status: DC | PRN

## 2024-05-02 MED ORDER — METOPROLOL TARTRATE 25 MG PO TABS: 25.0000 mg | ORAL_TABLET | Freq: Two times a day (BID) | ORAL | 11 refills | Status: DC

## 2024-05-02 NOTE — TOC Progression Note (Signed)
 Transition of Care [facility]) - Progression Note    Patient Details  Name: Brandon Dixon MRN: [id number] Date of Birth: 12-14-1945  Transition of Care [facility]) CM/SW Contact  Rosaline JONELLE Joe, RN Phone Number: 05/02/2024, 10:10 AM  Clinical Narrative:    CM noted that patient completed walk test with PT this morning and no HH needs and patient remained at 92% on RA with ambulation.  No IP Care management needs at this time and patient may likely discharge to home when stable per MD.   Expected Discharge Plan: Home/Self Care Barriers to Discharge: Continued Medical Work up  Expected Discharge Plan and Services                                               Social Determinants of Health (SDOH) Interventions SDOH Screenings   Food Insecurity: No Food Insecurity (05/01/2024)  Housing: Unknown (05/01/2024)  Transportation Needs: No Transportation Needs (05/01/2024)  Utilities: Not At Risk (05/01/2024)  Alcohol Screen: Low Risk  (08/13/2023)  Depression (PHQ2-9): Low Risk  (08/13/2023)  Financial Resource Strain: Low Risk  (08/13/2023)  Physical Activity: Sufficiently Active (08/13/2023)  Social Connections: Socially Integrated (05/01/2024)  Stress: No Stress Concern Present (08/13/2023)  Tobacco Use: Medium Risk (04/19/2024)  Health Literacy: Adequate Health Literacy (08/13/2023)    Readmission Risk Interventions     No data to display

## 2024-05-02 NOTE — Plan of Care (Signed)
  Problem: Education: Goal: Knowledge of General Education information will improve Description: Including pain rating scale, medication(s)/side effects and non-pharmacologic comfort measures Outcome: Progressing   Problem: Clinical Measurements: Goal: Ability to maintain clinical measurements within normal limits will improve Outcome: Progressing Goal: Will remain free from infection Outcome: Progressing Goal: Diagnostic test results will improve Outcome: Progressing Goal: Respiratory complications will improve Outcome: Progressing Goal: Cardiovascular complication will be avoided Outcome: Progressing   Problem: Activity: Goal: Risk for activity intolerance will decrease Outcome: Progressing   Problem: Nutrition: Goal: Adequate nutrition will be maintained Outcome: Progressing   Problem: Coping: Goal: Level of anxiety will decrease Outcome: Progressing   Problem: Elimination: Goal: Will not experience complications related to bowel motility Outcome: Progressing Goal: Will not experience complications related to urinary retention Outcome: Progressing   Problem: Safety: Goal: Ability to remain free from injury will improve Outcome: Progressing   Problem: Skin Integrity: Goal: Risk for impaired skin integrity will decrease Outcome: Progressing   

## 2024-05-02 NOTE — Progress Notes (Signed)
 Physical Therapy Treatment Patient Details Name: Brandon Dixon MRN: [id number] DOB: [birth year] Today's Date: 05/02/2024   History of Present Illness Pt is a 78 year old man admitted with CAP, sepsis and respiratory failure with hypoxia on 04/19/24. Hospital course complicated by new onset afib with RVR. PMH: PAD, PMR on chronic prednisone , HTN, HLD, prediabetes, BPH, GERD, B rotator cuff repairs.    PT Comments  Pt eager to mobilize however reports pain in L heel, especially with weight-bearing. Pt encouraged to don shoes/sneakers for walking for improved foot support. Pt reports some relief and agreed to wear shoes when ambulating. Pt's SpO2 at 92% on RA t/o ambulation and stair negotiation. Pt with mild SOB but able to recover within 2 min post ambulation. Acute PT to cont to follow.    If plan is discharge home, recommend the following: A little help with walking and/or transfers;Assistance with cooking/housework;Assist for transportation;Help with stairs or ramp for entrance   Can travel by private vehicle        Equipment Recommendations  None recommended by PT    Recommendations for Other Services       Precautions / Restrictions Precautions Precautions: Fall Recall of Precautions/Restrictions: Intact Restrictions Weight Bearing Restrictions Per Provider Order: No     Mobility  Bed Mobility Overal bed mobility: Modified Independent             General bed mobility comments: no difficulty, HOB elevated    Transfers Overall transfer level: Modified independent Equipment used: None Transfers: Sit to/from Stand Sit to Stand: Modified independent (Device/Increase time)           General transfer comment: no difficulty, wide base of support, pt reports pressure in R heel and mild pain in L heel    Ambulation/Gait Ambulation/Gait assistance: Contact guard assist Gait Distance (Feet): 250 Feet Assistive device: None Gait Pattern/deviations: Step-through pattern,  Decreased stride length, Wide base of support Gait velocity: decreased Gait velocity interpretation: 1.31 - 2.62 ft/sec, indicative of limited community ambulator   General Gait Details: HR up to 112 bpm. O2 sats  92% on room air   Stairs Stairs: Yes Stairs assistance: Contact guard assist Stair Management: One rail Right, Alternating pattern Number of Stairs: 4 (to mimic home) General stair comments: noted DOE but pt steady   Wheelchair Mobility     Tilt Bed    Modified Rankin (Stroke Patients Only)       Balance Overall balance assessment: Modified Independent Sitting-balance support: Bilateral upper extremity supported, Feet supported Sitting balance-Leahy Scale: Good Sitting balance - Comments: suspect due to fatigue, pt maintained UE support   Standing balance support: No upper extremity supported, During functional activity Standing balance-Leahy Scale: Fair Standing balance comment: no overt LOB                            Communication Communication Communication: No apparent difficulties  Cognition Arousal: Alert Behavior During Therapy: WFL for tasks assessed/performed   PT - Cognitive impairments: No apparent impairments                         Following commands: Intact      Cueing Cueing Techniques: Verbal cues  Exercises      General Comments General comments (skin integrity, edema, etc.): VSS      Pertinent Vitals/Pain Pain Assessment Pain Assessment: Faces Faces Pain Scale: Hurts little more Pain Location: L heel -  improved when wearing shoes    Home Living                          Prior Function            PT Goals (current goals can now be found in the care plan section) Acute Rehab PT Goals Patient Stated Goal: return to full independence PT Goal Formulation: With patient/family Time For Goal Achievement: 05/06/24 Potential to Achieve Goals: Good Progress towards PT goals: Progressing toward  goals    Frequency    Min 2X/week      PT Plan      Co-evaluation              AM-PAC PT 6 Clicks Mobility   Outcome Measure  Help needed turning from your back to your side while in a flat bed without using bedrails?: None Help needed moving from lying on your back to sitting on the side of a flat bed without using bedrails?: None Help needed moving to and from a bed to a chair (including a wheelchair)?: None Help needed standing up from a chair using your arms (e.g., wheelchair or bedside chair)?: None Help needed to walk in hospital room?: A Little Help needed climbing 3-5 steps with a railing? : A Little 6 Click Score: 22    End of Session Equipment Utilized During Treatment: Gait belt Activity Tolerance: Patient tolerated treatment well Patient left: with call bell/phone within reach;with family/visitor present;in chair Nurse Communication: Mobility status PT Visit Diagnosis: Unsteadiness on feet (R26.81);Muscle weakness (generalized) (M62.81)     Time: 9181-9164 PT Time Calculation (min) (ACUTE ONLY): 17 min  Charges:    $Gait Training: 8-22 mins PT General Charges $$ ACUTE PT VISIT: 1 Visit                     Norene Ames, PT, DPT Acute Rehabilitation Services Secure chat preferred Office #: [phone]    Norene CHRISTELLA Ames 05/02/2024, 8:54 AM

## 2024-05-02 NOTE — Discharge Summary (Signed)
 [facility] Discharge Summary   Patient: Brandon Dixon FMW:[id number]  PCP: Merna Huxley, NP  Date of admission: 04/19/2024   Date of discharge:  05/02/2024     Discharge Diagnoses:  Principal Problem:   Pneumonia of right lower lobe due to infectious organism Active Problems:   Acute hypoxic respiratory failure (HCC)   Sepsis (HCC)   A-fib (HCC)   AKI (acute kidney injury) (HCC)   Transaminitis   Hyperglycemia   Leukocytosis   PMR (polymyalgia rheumatica) (HCC)   Admitted From: Home Disposition:  Home   Recommendations for Outpatient Follow-up:  Follow-up with PCP in 1 week, repeat CBC after 1 week to check platelet count and repeat chest x-ray after 4 weeks for resolution of pneumonia. Follow-up with cardiology as an outpatient for transient A-fib converted back to sinus rhythm.  Patient was started on Lopressor . Follow up with  ID clinic to check convalescent titers for Coxiella. Follow up LABS/TEST: CBC and CMP after 1 week   Follow-up Information     Nafziger, Huxley, NP Follow up in 1 week(s).   Specialty: Family Medicine Contact information: [address] [place] [address] [phone]                Diet recommendation: Cardiac diet  Activity: The patient is advised to gradually reintroduce usual activities, as tolerated  Discharge Condition: stable  Code Status: Full code   History of present illness: As per the H and P dictated on admission.  Hospital Course:  As per prior documentation: 78 y.o. male with hx of PAD, PMR on chronic prednisone , hypertension, hyperlipidemia, prediabetes, BPH, GERD, who is transferred from [facility] ED for community-acquired pneumonia with acute hypoxic respiratory failure.  Ongoing for almost 2 weeks, seen by outpatient PCP on 6/30 and was given IM Rocephin  followed by 10 days of Augmentin .  Augmentin  caused GI upset but went to go see PCP again were noted to be hypoxic therefore sent to  the ER.  In the ER noted to have right lower lobe near complete consolidation on the CTA chest.  Started on IV Rocephin  and azithromycin .  Initially also in A-fib with RVR, new onset therefore started on p.o. metoprolol .  Hospital course also complicated by fluid overload requiring IV Lasix .  Slowly O2 weaned down.  Due to persistent fever ID and pulmonary performed bronchoscopy 7/8.  Extensive ID workup has been initiated and currently on Zosyn , Doxy to target Brucella, Coxiella per ID.    Assessment and Plan:   # Community-acquired pneumonia: # Acute hypoxic respiratory failure due to pneumonia resolved.  Currently patient saturating well on room air. # Sepsis due to PNA. Resolved, vitals stable. # Pleural effusion due to PNA -Patient failed outpatient treatment with Rocephin /Augmentin .    -CTA revealed large area of consolidation involving the right lung, most consistent with pneumonia.   -Due to persistent leukocytosis, CT chest abdomen pelvis performed which did not reveal any acute pathology.  IR attempted thoracentesis but minimal effusion.  MRSA swab was negative, completed azithromycin .  Was initially on Zosyn  and linezolid . -Status post bronchoscopy (7/8), brushings and BAL-no organisms seen. RVP, COVID/flu/RSV are negative.  C. difficile-negative HIV, urine Legionella, urine strep, cryptococcal antigen-negative Hepatitis panel, Q fever, brucella negative, crypto negative. AFB smears x3 negative, isolation discontinued.  BAL, Blood cultures remained negative -Stop IV Zosyn  and continue doxy 100  mg bid for 14 days per ID. -Follow up with  ID clinic to check convalescent titers for Coxiella. Follow  with PCP to repeat CBC after 1 week and chest x-ray after 4 weeks.  Stable to discharge today.    # Atrial fibrillation, new onset, initially RVR now, resolved -Patient is back to normal sinus rhythm. - Echocardiogram is nonrevealing. Normal TSH. - Continue metoprolol  25 Mg p.o. twice  daily. - Ambulatory referral to cardiology given.  Acute congestive heart failure with preserved ejection fraction -CHF improved with diuretics.   -Now euvolemic, despite ankle edema reported(ankle edema is likely secondary to hypoalbuminemia).-Echo is not revealing.   Acute kidney injury stage I, resolved Baseline creatinine 0.8, elevated 1.4 on admission.   Creatinine now stable around baseline.   Acute liver injury, mixed pattern AST, ALT, Alk phos gradually trending down. May be related to underlying infection, systemic illness vs antibiotics Right upper quadrant ultrasound negative. Hep panel negative.   Hypoalbuminemia- Encourage oral diet, supplements.   Essential hypertension Benicar  resumed, continue metoprolol  25 mg po BID Monitor BP at home and follow with PCP.  NIDD T2 resumed metformin   Body mass index is 22.27 kg/m.  Nutrition Interventions:  - Patient was instructed, not to drive, operate heavy machinery, perform activities at heights, swimming or participation in water activities or provide baby sitting services while on Pain, Sleep and Anxiety Medications; until his outpatient Physician has advised to do so again.  - Also recommended to not to take more than prescribed Pain, Sleep and Anxiety Medications.  Patient was ambulatory without any assistance. On the day of the discharge the patient's vitals were stable, and no other acute medical condition were reported by patient. the patient was felt safe to be discharge at Home.  Consultants: Pulmonary critical care Procedures: Bronchoscopy Trachea & bronchial tree examined to the subsegmental level. Moderate amount of liquid greenish yellow secretions pooled in RLL airways. These were lavaged & suctioned. No endobronchial lesions seen. Brushing obtained from RLL  Discharge Exam: General: Appear in no distress, no Rash; Oral Mucosa Clear, moist. Cardiovascular: S1 and S2 Present, no Murmur, Respiratory: normal  respiratory effort, Bilateral Air entry present and no Crackles, no wheezes Abdomen: Bowel Sound present, Soft and no tenderness, no hernia Extremities: no Pedal edema, no calf tenderness Neurology: alert and oriented to time, place, and person affect appropriate.  Filed Weights   04/23/24 0643 04/25/24 1015 04/28/24 1300  Weight: 77.2 kg 77.2 kg 74.5 kg   Vitals:   05/02/24 0457 05/02/24 0840  BP: 127/66 130/64  Pulse: 83 100  Resp: 18 18  Temp: 98.4 F (36.9 C) 97.8 F (36.6 C)  SpO2: 93% 93%    DISCHARGE MEDICATION: Allergies as of 05/02/2024       Reactions   Augmentin  [amoxicillin -pot Clavulanate] Diarrhea   Adverse reaction with diarrhea, decreased PO intake         Medication List     STOP taking these medications    amoxicillin -clavulanate 875-125 MG tablet Commonly known as: AUGMENTIN        TAKE these medications    aspirin  EC 81 MG tablet Take 81 mg by mouth daily.   atorvastatin  80 MG tablet Commonly known as: LIPITOR TAKE 1 TABLET EVERY EVENING   doxycycline  100 MG tablet Commonly known as: VIBRA -TABS Take 1 tablet (100 mg total) by mouth every 12 (twelve) hours for 17 doses.   guaiFENesin -dextromethorphan 100-10 MG/5ML syrup Commonly known as: ROBITUSSIN DM Take 10 mLs by mouth every 8 (eight) hours as needed for cough.   HYDROcodone  bit-homatropine 5-1.5 MG/5ML syrup Commonly known as: HYCODAN Take 5 mLs  by mouth every 4 (four) hours as needed. What changed: reasons to take this   hydrocortisone ointment 0.5 % Apply 1 application topically daily as needed for itching.   metFORMIN  500 MG 24 hr tablet Commonly known as: GLUCOPHAGE -XR Take 1 tablet by mouth once daily with breakfast   metoprolol  tartrate 25 MG tablet Commonly known as: LOPRESSOR  Take 1 tablet (25 mg total) by mouth 2 (two) times daily.   Multi-Vitamin tablet Take 1 tablet by mouth daily.   olmesartan  5 MG tablet Commonly known as: BENICAR  TAKE 1 TABLET EVERY  DAY   predniSONE  5 MG tablet Commonly known as: DELTASONE  Take 1 tablet (5 mg total) by mouth daily with breakfast.   tadalafil  20 MG tablet Commonly known as: CIALIS  Take 0.5-1 tablets (10-20 mg total) by mouth every other day as needed for erectile dysfunction.       Allergies  Allergen Reactions   Augmentin  [Amoxicillin -Pot Clavulanate] Diarrhea    Adverse reaction with diarrhea, decreased PO intake    Discharge Instructions     Ambulatory referral to Cardiology   Complete by: As directed    Call MD for:   Complete by: As directed    Chest pain or palpitations   Call MD for:  difficulty breathing, headache or visual disturbances   Complete by: As directed    Call MD for:  extreme fatigue   Complete by: As directed    Call MD for:  persistant dizziness or light-headedness   Complete by: As directed    Call MD for:  persistant nausea and vomiting   Complete by: As directed    Call MD for:  severe uncontrolled pain   Complete by: As directed    Call MD for:  temperature >100.4   Complete by: As directed    Diet - low sodium heart healthy   Complete by: As directed    Diet Carb Modified   Complete by: As directed    Discharge instructions   Complete by: As directed    Follow-up with PCP in 1 week, repeat CBC after 1 week to check platelet count and repeat chest x-ray after 4 weeks for resolution of pneumonia. Follow-up with cardiology as an outpatient for transient A-fib converted back to sinus rhythm.  Patient was started on Lopressor .   Increase activity slowly   Complete by: As directed        The results of significant diagnostics from this hospitalization (including imaging, microbiology, ancillary and laboratory) are listed below for reference.    Significant Diagnostic Studies: DG Chest 2 View Result Date: 04/27/2024 CLINICAL DATA:  Right pneumothorax EXAM: CHEST - 2 VIEW COMPARISON:  Chest radiograph dated 04/20/2024 FINDINGS: Normal lung volumes.  Slightly improved right lung aeration with decreased but persistent multifocal consolidations and patchy opacities. Trace right pleural effusion. No pneumothorax. Right heart border remains partially obscured. Left humeral bone anchors. IMPRESSION: 1. Slightly improved right lung aeration with decreased but persistent multifocal consolidations and patchy opacities. 2. Trace right pleural effusion. No pneumothorax. Electronically Signed   By: Limin  Xu M.D.   On: 04/27/2024 10:01   US  Abdomen Limited RUQ (LIVER/GB) Result Date: 04/26/2024 CLINICAL DATA:  Elevated liver enzymes. EXAM: ULTRASOUND ABDOMEN LIMITED RIGHT UPPER QUADRANT COMPARISON:  CT abdomen pelvis dated 04/22/2024. FINDINGS: Gallbladder: No gallstones or wall thickening visualized. No sonographic Murphy sign noted by sonographer. Common bile duct: Diameter: 3 mm Liver: No focal lesion identified. Within normal limits in parenchymal echogenicity. Portal vein is patent on  color Doppler imaging with normal direction of blood flow towards the liver. Other: A small right pleural effusion is partially visualized. IMPRESSION: Right pleural effusion, otherwise unremarkable right upper quadrant ultrasound. Electronically Signed   By: Vanetta Chou M.D.   On: 04/26/2024 10:33   CT ABDOMEN PELVIS W CONTRAST Result Date: 04/22/2024 CLINICAL DATA:  Sepsis rule out infection EXAM: CT ABDOMEN AND PELVIS WITH CONTRAST TECHNIQUE: Multidetector CT imaging of the abdomen and pelvis was performed using the standard protocol following bolus administration of intravenous contrast. RADIATION DOSE REDUCTION: This exam was performed according to the departmental dose-optimization program which includes automated exposure control, adjustment of the mA and/or kV according to patient size and/or use of iterative reconstruction technique. CONTRAST:  75mL OMNIPAQUE  IOHEXOL  350 MG/ML SOLN COMPARISON:  03/29/2022 FINDINGS: Lower chest: Extensive consolidation noted in the right  middle lobe and right lower lobe. Less pronounced patchy opacities in the left lower lobe. Findings compatible with pneumonia. Small right pleural effusion. A Hepatobiliary: No focal hepatic abnormality. Gallbladder unremarkable. Pancreas: No focal abnormality or ductal dilatation. Spleen: No focal abnormality.  Normal size. Adrenals/Urinary Tract: Left adrenal nodule measures 9 mm and is stable since prior study most compatible with adenoma. Right adrenal gland unremarkable. No renal or ureteral stones. No hydronephrosis. No focal renal abnormality. Stomach/Bowel: Scattered colonic diverticulosis. No active diverticulitis. Stomach and small bowel decompressed. No bowel obstruction or inflammatory process. Vascular/Lymphatic: Aortic atherosclerosis. No evidence of aneurysm or adenopathy. Reproductive: No visible focal abnormality. Other: No free fluid or free air. Musculoskeletal: No acute bony abnormality. IMPRESSION: Dense consolidation in the right middle lobe and right lower lobe and to a lesser extent left lower lobe compatible with multifocal pneumonia. Small right pleural effusion. No acute findings in the abdomen or pelvis. Colonic diverticulosis. Aortic atherosclerosis. Electronically Signed   By: Franky Crease M.D.   On: 04/22/2024 20:06   US  CHEST (PLEURAL EFFUSION) Result Date: 04/21/2024 CLINICAL DATA:  Right pleural effusion. EXAM: CHEST ULTRASOUND COMPARISON:  04/19/2024. FINDINGS: There is a trace right pleural effusion. No pleural effusion is seen on the left. IMPRESSION: Trace right pleural effusion. Electronically Signed   By: Leita Birmingham M.D.   On: 04/21/2024 14:02   DG Chest Port 1 View Result Date: 04/20/2024 CLINICAL DATA:  [id number] Dyspnea [id number] EXAM: PORTABLE CHEST 1 VIEW COMPARISON:  Chest x-ray 04/18/2024, CT chest 04/19/2024 FINDINGS: The heart and mediastinal contours are unchanged. Atherosclerotic plaque Interval worsening of patchy airspace and interstitial opacities of the right  lung with almost complete opacification of the mid lower lung zones. No pulmonary edema. Persistent right pleural effusion. Possible trace left pleural effusion. No pneumothorax. No acute osseous abnormality. Left shoulder surgical anchor sutures. IMPRESSION: 1. Interval worsening of patchy airspace and interstitial opacities of the right lung with almost complete opacification of the mid lower lung zones. 2. Persistent right pleural effusion. 3. Possible trace left pleural effusion. 4.  Aortic Atherosclerosis (ICD10-I70.0). Electronically Signed   By: Morgane  Naveau M.D.   On: 04/20/2024 17:21   ECHOCARDIOGRAM COMPLETE Result Date: 04/20/2024    ECHOCARDIOGRAM REPORT   Patient Name:   KHOLTON COATE Date of Exam: 04/20/2024 Medical Rec #:  [id number]      Height:       72.0 in Accession #:    [id number]     Weight:       171.7 lb Date of Birth:  [birth year]      BSA:          1.997  m Patient Age:    78 years       BP:           131/72 mmHg Patient Gender: M              HR:           105 bpm. Exam Location:  Inpatient Procedure: 2D Echo, Cardiac Doppler, Color Doppler and Intracardiac            Opacification Agent (Both Spectral and Color Flow Doppler were            utilized during procedure). Indications:    Atrial Fibrillation  History:        Patient has no prior history of Echocardiogram examinations.                 Risk Factors:Hypertension and Dyslipidemia.  Sonographer:    Therisa Crouch Referring Phys: [id number] JONATHAN SEGARS IMPRESSIONS  1. Left ventricular ejection fraction, by estimation, is 55 to 60%. The left ventricle has normal function. The left ventricle has no regional wall motion abnormalities. Left ventricular diastolic parameters were normal.  2. Right ventricular systolic function is normal. The right ventricular size is normal. There is moderately elevated pulmonary artery systolic pressure.  3. Left atrial size was mildly dilated.  4. The mitral valve is grossly normal. Trivial mitral valve  regurgitation. No evidence of mitral stenosis.  5. The aortic valve is grossly normal. Aortic valve regurgitation is not visualized. No aortic stenosis is present.  6. The inferior vena cava is normal in size with greater than 50% respiratory variability, suggesting right atrial pressure of 3 mmHg. Comparison(s): No prior Echocardiogram. Conclusion(s)/Recommendation(s): Normal biventricular function without evidence of hemodynamically significant valvular heart disease. FINDINGS  Left Ventricle: Left ventricular ejection fraction, by estimation, is 55 to 60%. The left ventricle has normal function. The left ventricle has no regional wall motion abnormalities. Definity  contrast agent was given IV to delineate the left ventricular  endocardial borders. The left ventricular internal cavity size was normal in size. There is no left ventricular hypertrophy. Left ventricular diastolic parameters were normal. Right Ventricle: The right ventricular size is normal. No increase in right ventricular wall thickness. Right ventricular systolic function is normal. There is moderately elevated pulmonary artery systolic pressure. The tricuspid regurgitant velocity is 3.54 m/s, and with an assumed right atrial pressure of 3 mmHg, the estimated right ventricular systolic pressure is 53.1 mmHg. Left Atrium: Left atrial size was mildly dilated. Right Atrium: Right atrial size was normal in size. Pericardium: There is no evidence of pericardial effusion. Mitral Valve: The mitral valve is grossly normal. Trivial mitral valve regurgitation. No evidence of mitral valve stenosis. Tricuspid Valve: The tricuspid valve is normal in structure. Tricuspid valve regurgitation is trivial. No evidence of tricuspid stenosis. Aortic Valve: The aortic valve is grossly normal. Aortic valve regurgitation is not visualized. No aortic stenosis is present. Pulmonic Valve: The pulmonic valve was not well visualized. Pulmonic valve regurgitation is not  visualized. No evidence of pulmonic stenosis. Aorta: The aortic root, ascending aorta, aortic arch and descending aorta are all structurally normal, with no evidence of dilitation or obstruction and the aortic root and ascending aorta are structurally normal, with no evidence of dilitation. Venous: The inferior vena cava is normal in size with greater than 50% respiratory variability, suggesting right atrial pressure of 3 mmHg. IAS/Shunts: The atrial septum is grossly normal.  LEFT VENTRICLE PLAX 2D LVIDd:  4.10 cm Diastology LVIDs:         2.90 cm LV e' medial:    8.49 cm/s LV PW:         0.80 cm LV E/e' medial:  9.6 LV IVS:        0.90 cm LV e' lateral:   15.10 cm/s                        LV E/e' lateral: 5.4  RIGHT VENTRICLE             IVC RV Basal diam:  3.90 cm     IVC diam: 1.70 cm RV S prime:     15.30 cm/s TAPSE (M-mode): 2.3 cm LEFT ATRIUM             Index        RIGHT ATRIUM           Index LA diam:        3.10 cm 1.55 cm/m   RA Area:     17.20 cm LA Vol (A2C):   50.9 ml 25.49 ml/m  RA Volume:   49.50 ml  24.79 ml/m LA Vol (A4C):   62.2 ml 31.15 ml/m LA Biplane Vol: 59.9 ml 30.00 ml/m   AORTA Ao Root diam: 3.40 cm Ao Asc diam:  2.80 cm MITRAL VALVE               TRICUSPID VALVE MV Area (PHT): 4.86 cm    TR Peak grad:   50.1 mmHg MV Decel Time: 156 msec    TR Vmax:        354.00 cm/s MV E velocity: 81.40 cm/s MV A velocity: 85.30 cm/s MV E/A ratio:  0.95 Shelda Bruckner MD Electronically signed by Shelda Bruckner MD Signature Date/Time: 04/20/2024/4:10:29 PM    Final    CT Angio Chest PE W/Cm &/Or Wo Cm Result Date: 04/19/2024 CLINICAL DATA:  Concern for pulmonary embolism. EXAM: CT ANGIOGRAPHY CHEST WITH CONTRAST TECHNIQUE: Multidetector CT imaging of the chest was performed using the standard protocol during bolus administration of intravenous contrast. Multiplanar CT image reconstructions and MIPs were obtained to evaluate the vascular anatomy. RADIATION DOSE REDUCTION: This  exam was performed according to the departmental dose-optimization program which includes automated exposure control, adjustment of the mA and/or kV according to patient size and/or use of iterative reconstruction technique. CONTRAST:  75mL OMNIPAQUE  IOHEXOL  350 MG/ML SOLN COMPARISON:  Chest radiograph dated 04/18/2024. FINDINGS: Cardiovascular: There is no cardiomegaly or pericardial effusion. The thoracic aorta is unremarkable. The origins of the great vessels of the aortic arch appear patent. No pulmonary artery embolus identified. Mediastinum/Nodes: No obvious adenopathy. Evaluation however is limited due to consolidative changes of the right lung. The esophagus is grossly unremarkable. No mediastinal fluid collection. Lungs/Pleura: Large area of consolidation involving the right lung with near complete consolidation of the right lower lobe most consistent with pneumonia. Underlying mass is not excluded clinical correlation and follow-up after treatment recommended to document resolution. There is an 8 mm nodule in the lingula. Attention on follow-up imaging recommended. There is a small right pleural effusion. No pneumothorax. The central airways are patent. Upper Abdomen: No acute abnormality. Musculoskeletal: Degenerative changes of the spine. No acute osseous pathology. Review of the MIP images confirms the above findings. IMPRESSION: 1. No CT evidence of pulmonary artery embolus. 2. Large area of consolidation involving the right lung most consistent with pneumonia. Clinical correlation and follow-up after treatment recommended  to document resolution. 3. Small right pleural effusion. 4. An 8 mm nodule in the lingula. Attention on follow-up imaging recommended. Electronically Signed   By: Vanetta Chou M.D.   On: 04/19/2024 18:14   DG Chest 2 View Result Date: 04/18/2024 CLINICAL DATA:  cough with RLL rales EXAM: CHEST - 2 VIEW COMPARISON:  01/16/2008. FINDINGS: The heart size and mediastinal contours  are within normal limits. Right basilar opacity, most compatible with pneumonia. Left lung is clear. No pleural effusion or pneumothorax. Evidence of prior left rotator cuff repair with suture anchors in the humeral head. No acute osseous abnormality. IMPRESSION: Right basilar opacity is most compatible with pneumonia. Followup PA and lateral chest X-ray is recommended in 3-4 weeks to ensure resolution and exclude underlying malignancy. Electronically Signed   By: Harrietta Sherry M.D.   On: 04/18/2024 10:37    Microbiology: Recent Results (from the past 240 hours)  C Difficile Quick Screen (NO PCR Reflex)     Status: None   Collection Time: 04/23/24 11:38 AM   Specimen: STOOL  Result Value Ref Range Status   C Diff antigen NEGATIVE NEGATIVE Final   C Diff toxin NEGATIVE NEGATIVE Final   C Diff interpretation No C. difficile detected.  Final    Comment: Performed at [facility], [address], [place], [place] [address]  Blastomyces Antigen     Status: None   Collection Time: 04/23/24 11:39 AM   Specimen: Blood  Result Value Ref Range Status   Blastomyces Antigen None Detected None Detected ng/mL Final    Comment: (NOTE) Reference Interval: None Detected Reportable Range: 0.31 ng/mL - 20.00 ng/mL Results above 20.00 ng/mL are reported as 'Positive, Above the Limit of Quantification' This test was developed and its performance characteristics determined by [employer]. It has not been cleared or approved by the FDA; however, FDA clearance or approval is not currently required for clinical use. The results are not intended to be used as the sole means for clinical diagnosis or patient decisions.    Interpretation Negative  Final   Specimen Type SERUM  Final    Comment: (NOTE) Performed At: [facility] [address] [place], [place] [id number] [place] Pac MD Ey:[id number]   Acid Fast Smear (AFB)     Status: None   Collection Time: 04/24/24  9:08  AM   Specimen: Sputum  Result Value Ref Range Status   AFB Specimen Processing Concentration  Final   Acid Fast Smear Negative  Final    Comment: (NOTE) Performed At: [facility] [address] [place], [place] [id number] Jennette Shorter MD Ey:[id number]    Source (AFB) SPUTUM  Final    Comment: Performed at [facility], [address], [place], [place] [address]  Fungus Culture With Stain     Status: None (Preliminary result)   Collection Time: 04/25/24 10:46 AM   Specimen: Bronchial Alveolar Lavage; Respiratory  Result Value Ref Range Status   Fungus Stain Final report  Final    Comment: (NOTE) Performed At: [facility] [address] [place], [place] [id number] Jennette Shorter MD Ey:[id number]    Fungus (Mycology) Culture PENDING  Incomplete   Fungal Source BRONCHIAL ALVEOLAR LAVAGE  Final    Comment: Performed at [facility], [address], [place], [place] [address]  Culture, BAL-quantitative w Gram Stain     Status: None   Collection Time: 04/25/24 10:46 AM   Specimen: Bronchial Alveolar Lavage; Respiratory  Result  Value Ref Range Status   Specimen Description BRONCHIAL ALVEOLAR LAVAGE  Final   Special Requests NONE  Final   Gram Stain NO WBC SEEN NO ORGANISMS SEEN   Final   Culture   Final    NO GROWTH 2 DAYS Performed at [facility], [address], [place], [place] [address]    Report Status 04/27/2024 FINAL  Final  Aerobic/Anaerobic Culture w Gram Stain (surgical/deep wound)     Status: None   Collection Time: 04/25/24 10:46 AM   Specimen: Bronchial Alveolar Lavage; Respiratory  Result Value Ref Range Status   Specimen Description BRONCHIAL ALVEOLAR LAVAGE  Final   Special Requests NONE  Final   Gram Stain NO WBC SEEN NO ORGANISMS SEEN   Final   Culture   Final    No growth aerobically or anaerobically. Performed at [facility], [address], [place], [place] [address]    Report Status 04/30/2024 FINAL  Final   Acid Fast Smear (AFB)     Status: None   Collection Time: 04/25/24 10:46 AM   Specimen: Bronchial Alveolar Lavage; Respiratory  Result Value Ref Range Status   AFB Specimen Processing Concentration  Final   Acid Fast Smear Negative  Final    Comment: (NOTE) Performed At: [facility] [address] [place], [place] [id number] Jennette Shorter MD Ey:[id number]    Source (AFB) BRONCHIAL ALVEOLAR LAVAGE  Final    Comment: Performed at [facility], [address], [place], [place] [address]  Fungus Culture Result     Status: None   Collection Time: 04/25/24 10:46 AM  Result Value Ref Range Status   Result 1 Comment  Final    Comment: (NOTE) KOH/Calcofluor preparation:  no fungus observed. Performed At: [facility] [address] [place], [place] [id number] Jennette Shorter MD Ey:[id number]   Acid Fast Smear (AFB)     Status: None   Collection Time: 04/26/24  6:58 AM   Specimen: Sputum  Result Value Ref Range Status   AFB Specimen Processing Concentration  Final   Acid Fast Smear Negative  Final    Comment: (NOTE) Performed At: [facility] [address] [place], [place] [id number] Jennette Shorter MD Ey:[id number]    Source (AFB) SPU  Final    Comment: Performed at [facility], [address] [place], [place] [address]     Labs: CBC: Recent Labs  Lab 04/28/24 0305 04/29/24 0225 04/30/24 0236 05/01/24 0944 05/02/24 0425  WBC 18.7* 17.5* 15.5* 12.6* 10.2  NEUTROABS  --   --   --  8.9* 6.9  HGB 12.3* 12.1* 13.1 13.7 13.3  HCT 36.8* 35.9* 39.2 41.0 40.3  MCV 87.8 85.1 87.7 88.0 88.6  PLT 641* 746* 765* 809* 715*   Basic Metabolic Panel: Recent Labs  Lab 04/26/24 0436 04/27/24 1000 04/27/24 1000 04/28/24 0305 04/29/24 0225 04/30/24 0236 05/01/24 0258 05/02/24 0425  NA  --  135   < > 138 137 135 137 138  K  --  4.0   < > 4.2 4.0 4.4 4.3 4.6  CL  --  102   < > 106 104 103 103 103  CO2  --  22   < > 20* 23 23 25 26   GLUCOSE   --  169*   < > 107* 112* 118* 128* 119*  BUN  --  15   < > 14 13 17 16 17   CREATININE  --  0.98   < >  0.86 0.86 0.88 0.88 0.83  CALCIUM   --  8.3*   < > 8.1* 8.5* 8.8* 8.8* 9.2  MG 2.2 1.9  --   --   --   --   --   --    < > = values in this interval not displayed.   Liver Function Tests: Recent Labs  Lab 04/28/24 0305 04/29/24 0225 04/30/24 0236 05/01/24 0258 05/02/24 0425  AST 148* 96* 68* 58* 63*  ALT 101* 86* 70* 62* 63*  ALKPHOS 175* 166* 172* 166* 159*  BILITOT 0.8 0.7 0.7 0.7 0.6  PROT 6.2* 6.4* 6.9 6.9 7.1  ALBUMIN  1.8* 2.0* 2.3* 2.3* 2.3*   No results for input(s): LIPASE, AMYLASE in the last 168 hours. No results for input(s): AMMONIA in the last 168 hours. Cardiac Enzymes: No results for input(s): CKTOTAL, CKMB, CKMBINDEX, TROPONINI in the last 168 hours. BNP (last 3 results) Recent Labs    04/20/24 0905 04/22/24 0632  BNP 692.1* 193.6*   CBG: Recent Labs  Lab 05/01/24 0621 05/01/24 1120 05/01/24 1716 05/01/24 2038 05/02/24 0837  GLUCAP 113* 126* 167* 153* 214*    Time spent: 35 minutes  Signed:  Elvan Sor  [facility]  05/02/2024 11:25 AM

## 2024-05-03 ENCOUNTER — Telehealth: Payer: Self-pay

## 2024-05-03 NOTE — Transitions of Care (Post Inpatient/ED Visit) (Unsigned)
   05/03/2024  Name: Brandon Dixon MRN: [id number] DOB: [birth year]  Today's TOC FU Call Status: Today's TOC FU Call Status:: Unsuccessful Call (1st Attempt) Unsuccessful Call (1st Attempt) Date: 05/03/24  Attempted to reach the patient regarding the most recent Inpatient/ED visit.  Follow Up Plan: Additional outreach attempts will be made to reach the patient to complete the Transitions of Care (Post Inpatient/ED visit) call.   Signature Julian Lemmings, LPN [facility] Nurse Health Advisor Direct Dial [phone]

## 2024-05-04 NOTE — Transitions of Care (Post Inpatient/ED Visit) (Signed)
 05/04/2024  Name: Brandon Dixon MRN: [id number] DOB: Oct 19, 1946  Today's TOC FU Call Status: Today's TOC FU Call Status:: Successful TOC FU Call Completed Unsuccessful Call (1st Attempt) Date: 05/03/24 [facility] FU Call Complete Date: 05/04/24 Patient's Name and Date of Birth confirmed.  Transition Care Management Follow-up Telephone Call Date of Discharge: 05/02/24 Discharge Facility: [facility]) Type of Discharge: Inpatient Admission Primary Inpatient Discharge Diagnosis:: pneumonia How have you been since you were released from the hospital?: Better Any questions or concerns?: No  Items Reviewed: Did you receive and understand the discharge instructions provided?: Yes Medications obtained,verified, and reconciled?: Yes (Medications Reviewed) Any new allergies since your discharge?: No Dietary orders reviewed?: Yes Do you have support at home?: Yes People in Home [RPT]: spouse  Medications Reviewed Today: Medications Reviewed Today     Reviewed by Emmitt Pan, LPN (Licensed Practical Nurse) on 05/04/24 at 1409  Med List Status: <None>   Medication Order Taking? Sig Documenting Provider Last Dose Status Informant  aspirin  EC 81 MG tablet [id number] Yes Take 81 mg by mouth daily. [provider]  Active Self  atorvastatin  (LIPITOR) 80 MG tablet [id number] Yes TAKE 1 TABLET EVERY EVENING Nafziger, Darleene, NP  Active Self  doxycycline  (VIBRA -TABS) 100 MG tablet [id number] Yes Take 1 tablet (100 mg total) by mouth every 12 (twelve) hours for 17 doses. Von Bellis, MD  Active   guaiFENesin -dextromethorphan (ROBITUSSIN DM) 100-10 MG/5ML syrup [id number] Yes Take 10 mLs by mouth every 8 (eight) hours as needed for cough. Von Bellis, MD  Active   HYDROcodone  bit-homatropine [facility]) 5-1.5 MG/5ML syrup [id number] Yes Take 5 mLs by mouth every 4 (four) hours as needed.  Patient taking differently: Take 5 mLs by mouth every 4 (four) hours as needed for cough.   Johnny Garnette LABOR, MD  Active Self  hydrocortisone ointment 0.5 % [id number] Yes Apply 1 application topically daily as needed for itching. [provider]  Active Self  metFORMIN  (GLUCOPHAGE -XR) 500 MG 24 hr tablet [id number] Yes Take 1 tablet by mouth once daily with breakfast Nafziger, Darleene, NP  Active Self  metoprolol  tartrate (LOPRESSOR ) 25 MG tablet [id number] Yes Take 1 tablet (25 mg total) by mouth 2 (two) times daily. Von Bellis, MD  Active   Multiple Vitamin (MULTI-VITAMIN) tablet [id number] Yes Take 1 tablet by mouth daily. [provider]  Active Self  olmesartan  (BENICAR ) 5 MG tablet [id number] Yes TAKE 1 TABLET EVERY DAY Nafziger, Darleene, NP  Active Self  predniSONE  (DELTASONE ) 5 MG tablet [id number] Yes Take 1 tablet (5 mg total) by mouth daily with breakfast. Merna Darleene, NP  Active Self  tadalafil  (CIALIS ) 20 MG tablet [id number] Yes Take 0.5-1 tablets (10-20 mg total) by mouth every other day as needed for erectile dysfunction. Nafziger, Darleene, NP  Active Self            Home Care and Equipment/Supplies: Were Home Health Services Ordered?: NA Any new equipment or medical supplies ordered?: NA  Functional Questionnaire: Do you need assistance with bathing/showering or dressing?: No Do you need assistance with meal preparation?: No Do you need assistance with eating?: No Do you have difficulty maintaining continence: No Do you have difficulty managing or taking your medications?: No  Follow up appointments reviewed: PCP Follow-up appointment confirmed?: Yes Date of PCP follow-up appointment?: 05/09/24 Follow-up Provider: [facility] Follow-up appointment confirmed?: NA Do you need transportation to your follow-up appointment?: No Do you understand care options if your condition(s) worsen?: Yes-patient verbalized  understanding    SIGNATURE Julian Lemmings, LPN [facility] Nurse Health Advisor Direct Dial [phone]

## 2024-05-08 ENCOUNTER — Telehealth (HOSPITAL_BASED_OUTPATIENT_CLINIC_OR_DEPARTMENT_OTHER): Payer: Self-pay

## 2024-05-08 NOTE — Telephone Encounter (Signed)
 Copied from CRM [id number]. Topic: Clinical - Medical Advice >> May 08, 2024  9:32 AM Joesph PARAS wrote: Reason for CRM: Patient spouse is calling to inquire about recent result indicating fungal infection. Would like to be updated on plan of care if any updates are needed.  C/B at [phone] for spouse Berni.

## 2024-05-08 NOTE — Telephone Encounter (Signed)
 Pt spouse notified.

## 2024-05-09 ENCOUNTER — Encounter: Payer: Self-pay | Admitting: Adult Health

## 2024-05-09 ENCOUNTER — Ambulatory Visit (INDEPENDENT_AMBULATORY_CARE_PROVIDER_SITE_OTHER): Admitting: Adult Health

## 2024-05-09 VITALS — BP 122/60 | HR 89 | Temp 98.2°F | Ht 72.0 in | Wt 156.2 lb

## 2024-05-09 DIAGNOSIS — R7303 Prediabetes: Secondary | ICD-10-CM

## 2024-05-09 DIAGNOSIS — F5101 Primary insomnia: Secondary | ICD-10-CM

## 2024-05-09 DIAGNOSIS — I480 Paroxysmal atrial fibrillation: Secondary | ICD-10-CM | POA: Diagnosis not present

## 2024-05-09 DIAGNOSIS — R1310 Dysphagia, unspecified: Secondary | ICD-10-CM | POA: Diagnosis not present

## 2024-05-09 DIAGNOSIS — E8809 Other disorders of plasma-protein metabolism, not elsewhere classified: Secondary | ICD-10-CM | POA: Diagnosis not present

## 2024-05-09 DIAGNOSIS — I509 Heart failure, unspecified: Secondary | ICD-10-CM

## 2024-05-09 DIAGNOSIS — I10 Essential (primary) hypertension: Secondary | ICD-10-CM | POA: Diagnosis not present

## 2024-05-09 DIAGNOSIS — N179 Acute kidney failure, unspecified: Secondary | ICD-10-CM | POA: Diagnosis not present

## 2024-05-09 DIAGNOSIS — J189 Pneumonia, unspecified organism: Secondary | ICD-10-CM

## 2024-05-09 MED ORDER — TRAZODONE HCL 50 MG PO TABS: 25.0000 mg | ORAL_TABLET | Freq: Every evening | ORAL | 0 refills | Status: DC | PRN

## 2024-05-09 NOTE — Progress Notes (Addendum)
 Subjective:    Patient ID: Brandon Dixon, male    DOB: 1946-01-22, 78 y.o.   MRN: [id number]  HPI 78 year old male who  has a past medical history of ALLERGIC RHINITIS (09/26/2007), BENIGN PROSTATIC HYPERTROPHY (05/27/2007), BPH with urinary obstruction, COLONIC POLYPS, HX OF (05/27/2007), DERMATITIS (08/25/2010), GERD (05/27/2007), HYPERCHOLESTEROLEMIA (09/26/2007), and Peripheral vascular disease (HCC).  He presents to the office today for TCM visit   Admit Date 04/19/2024 Discharge Date 05/02/2024  He transferred from [facility] ED for community-acquired pneumonia with acute hypoxic respiratory failure that have been ongoing for almost 2 weeks.  He was seen outpatient on 04/17/2024 and was given IM Rocephin  followed by 10 days of Augmentin .  Augmentin  caused GI upset but then was seen outpatient and again was noted to be hypoxic therefore sent to the ER.  In the ER he was noted to have right lower lobe near complete consolidation on CTA of the chest.  He was started on IV Rocephin  and azithromycin .  He was initially also noted to be in A-fib with RVR which was new onset for him and was started on p.o. metoprolol .  His hospital course was complicated by fluid overload requiring IV Lasix .  He had a persistent fever and infectious disease and pulmonary performed a bronchoscopy on 04/26/2023 and he was initiated on Zosyn , Doxy to target Brucella Coxiella  Hospital Course  CAP with acute hypoxic respiratory failure  - -Patient failed outpatient treatment with Rocephin /Augmentin .    -CTA revealed large area of consolidation involving the right lung, most consistent with pneumonia.   -Due to persistent leukocytosis, CT chest abdomen pelvis performed which did not reveal any acute pathology.  IR attempted thoracentesis but minimal effusion.  MRSA swab was negative, completed azithromycin .  Was initially on Zosyn  and linezolid . -Status post bronchoscopy (7/8), brushings and BAL-no organisms  seen. RVP, COVID/flu/RSV are negative.  C. difficile-negative HIV, urine Legionella, urine strep, cryptococcal antigen-negative Hepatitis panel, Q fever, brucella negative, crypto negative. AFB smears x3 negative, isolation discontinued.  BAL, Blood cultures remained negative -Stop IV Zosyn  and continue doxy 100  mg bid for 14 days per ID. -Follow up with  ID clinic to check convalescent titers for Coxiella. Follow with PCP to repeat CBC after 1 week and chest x-ray after 4 weeks.  Stable to discharge today.  Atrial Fibrillation  - Reverted back to normal sinus rhythm.  Echocardiogram was nonrevealing.  He had a normal TSH.  He was continued on metoprolol  25 mg twice daily and referred to cardiology  Acute congestive heart failure with preserved ejection fraction - CHF improved with diuretics  Kidney injury stage I -Creatinine 0.8, elevated 1.4 on admission.  He was back to his line upon discharge  Acute liver injury  - AST, ALT, alk phos trending down.  Thought may be related to underlying infection, systemic illness versus antibiotics.  His right upper quadrant ultrasound was negative.  Hep panel negative  Hypoalbuminemia  - Encouraged oral diet and supplements  Essential hypertension - Benicar  resumed, continue metoprolol  25 mg twice daily.  Type 2 DM - continued on Metformin  500 mg ER daily.   Today he presents to the office with his wife.  He is starting to feel little bit better but continues to feel weak and have a productive cough with clear sputum.  He has not had any fevers or chills, shortness of breath, or wheezing since being discharged from hospital.  He does have an appointment with infectious  disease next week. He has not heard from Cardiology yet.   His wife reports that while he was in the hospital there was some concern for aspiration, she reports today that she has noticed that often when he eats he will cough like he is trying to lodged food from his esophagus.  They  are wondering about a swallow study.  He also has issues with sleep and was prescribed Trazodone  in in the office and is wondering if he can get a prescription for this   Wt Readings from Last 3 Encounters:  05/09/24 156 lb 3.2 oz (70.9 kg)  04/28/24 164 lb 3.2 oz (74.5 kg)  04/19/24 171 lb (77.6 kg)      Review of Systems  Constitutional:  Positive for fatigue. Negative for fever.  HENT: Negative.    Eyes: Negative.   Respiratory:  Positive for cough.   Cardiovascular: Negative.   Gastrointestinal: Negative.   Endocrine: Negative.   Genitourinary: Negative.   Musculoskeletal: Negative.   Skin: Negative.   Allergic/Immunologic: Negative.   Neurological: Negative.   Hematological: Negative.   Psychiatric/Behavioral:  Positive for sleep disturbance.   All other systems reviewed and are negative.  Past Medical History:  Diagnosis Date   ALLERGIC RHINITIS 09/26/2007   BENIGN PROSTATIC HYPERTROPHY 05/27/2007   BPH with urinary obstruction    COLONIC POLYPS, HX OF 05/27/2007   DERMATITIS 08/25/2010   GERD 05/27/2007   HYPERCHOLESTEROLEMIA 09/26/2007   Peripheral vascular disease (HCC)     Social History   Socioeconomic History   Marital status: Married    Spouse name: Not on file   Number of children: Not on file   Years of education: Not on file   Highest education level: Some college, no degree  Occupational History   Not on file  Tobacco Use   Smoking status: Former    Current packs/day: 0.00    Types: Cigarettes    Quit date: 10/19/2001    Years since quitting: 22.5   Smokeless tobacco: Never  Vaping Use   Vaping status: Never Used  Substance and Sexual Activity   Alcohol use: Yes    Alcohol/week: 6.0 standard drinks of alcohol    Types: 6 Cans of beer per week   Drug use: No   Sexual activity: Not on file  Other Topics Concern   Not on file  Social History Narrative   Works as a Patent attorney    Married    Three daughters    Social Drivers of  Corporate investment banker Strain: Low Risk  (08/13/2023)   Overall Financial Resource Strain (CARDIA)    Difficulty of Paying Living Expenses: Not hard at all  Food Insecurity: No Food Insecurity (05/01/2024)   Hunger Vital Sign    Worried About Running Out of Food in the Last Year: Never true    Ran Out of Food in the Last Year: Never true  Transportation Needs: No Transportation Needs (05/01/2024)   PRAPARE - Administrator, Civil Service (Medical): No    Lack of Transportation (Non-Medical): No  Physical Activity: Sufficiently Active (08/13/2023)   Exercise Vital Sign    Days of Exercise per Week: 7 days    Minutes of Exercise per Session: 60 min  Stress: No Stress Concern Present (08/13/2023)   [employer] of Occupational Health - Occupational Stress Questionnaire    Feeling of Stress : Not at all  Social Connections: Socially Integrated (05/01/2024)   Social Connection and Isolation  Panel    Frequency of Communication with Friends and Family: More than three times a week    Frequency of Social Gatherings with Friends and Family: More than three times a week    Attends Religious Services: More than 4 times per year    Active Member of Clubs or Organizations: Yes    Attends Banker Meetings: More than 4 times per year    Marital Status: Married  Catering manager Violence: Not At Risk (05/01/2024)   Humiliation, Afraid, Rape, and Kick questionnaire    Fear of Current or Ex-Partner: No    Emotionally Abused: No    Physically Abused: No    Sexually Abused: No    Past Surgical History:  Procedure Laterality Date   CYSTOSCOPY  11/10/2022   per [facility], Holmium laser enucleation with morcellation   LOWER EXTREMITY ANGIOGRAM Left 04/30/2014   Procedure: LOWER EXTREMITY ANGIOGRAM;  Surgeon: Lonni GORMAN Blade, MD;  Location: [facility] CATH LAB;  Service: Cardiovascular;  Laterality: Left;   ROTATOR CUFF REPAIR     VASECTOMY     VIDEO BRONCHOSCOPY  Bilateral 04/25/2024   Procedure: VIDEO BRONCHOSCOPY WITHOUT FLUORO;  Surgeon: Jude Harden GAILS, MD;  Location: [facility] ENDOSCOPY;  Service: Cardiopulmonary;  Laterality: Bilateral;    Family History  Problem Relation Age of Onset   Heart disease Mother    Heart attack Mother        20    Cancer Father        lung ca   Hypertension Sister    Diabetes Brother    Hypertension Sister    Diabetes Brother    Diabetes Brother    Diabetes Brother    Diabetes Brother    Diabetes Daughter     Allergies  Allergen Reactions   Augmentin  [Amoxicillin -Pot Clavulanate] Diarrhea    Adverse reaction with diarrhea, decreased PO intake     Current Outpatient Medications on File Prior to Visit  Medication Sig Dispense Refill   aspirin  EC 81 MG tablet Take 81 mg by mouth daily.     atorvastatin  (LIPITOR) 80 MG tablet TAKE 1 TABLET EVERY EVENING 90 tablet 3   doxycycline  (VIBRA -TABS) 100 MG tablet Take 1 tablet (100 mg total) by mouth every 12 (twelve) hours for 17 doses. 17 tablet 0   guaiFENesin -dextromethorphan (ROBITUSSIN DM) 100-10 MG/5ML syrup Take 10 mLs by mouth every 8 (eight) hours as needed for cough. 118 mL 0   hydrocortisone ointment 0.5 % Apply 1 application topically daily as needed for itching.     metFORMIN  (GLUCOPHAGE -XR) 500 MG 24 hr tablet Take 1 tablet by mouth once daily with breakfast 90 tablet 0   metoprolol  tartrate (LOPRESSOR ) 25 MG tablet Take 1 tablet (25 mg total) by mouth 2 (two) times daily. 60 tablet 11   Multiple Vitamin (MULTI-VITAMIN) tablet Take 1 tablet by mouth daily.     olmesartan  (BENICAR ) 5 MG tablet TAKE 1 TABLET EVERY DAY 90 tablet 3   tadalafil  (CIALIS ) 20 MG tablet Take 0.5-1 tablets (10-20 mg total) by mouth every other day as needed for erectile dysfunction. 10 tablet 11   No current facility-administered medications on file prior to visit.    BP 122/60   Pulse 89   Temp 98.2 F (36.8 C)   Ht 6' (1.829 m)   Wt 156 lb 3.2 oz (70.9 kg)   SpO2 92%   BMI  21.18 kg/m       Objective:   Physical Exam Vitals and  nursing note reviewed.  Constitutional:      Appearance: Normal appearance.     Comments: Appears tired   Cardiovascular:     Rate and Rhythm: Normal rate and regular rhythm.     Pulses: Normal pulses.     Heart sounds: Normal heart sounds.  Pulmonary:     Effort: Pulmonary effort is normal.     Breath sounds: Normal breath sounds.  Musculoskeletal:        General: Normal range of motion.  Skin:    General: Skin is warm and dry.  Neurological:     General: No focal deficit present.     Mental Status: He is alert and oriented to person, place, and time.  Psychiatric:        Mood and Affect: Mood normal.        Behavior: Behavior normal.        Thought Content: Thought content normal.        Judgment: Judgment normal.        Assessment & Plan:  1. Pneumonia of right lower lobe due to infectious organism (Primary) - Reviewed hospital notes, discharge instructions, labs, imaging, medication changes if any.  All questions answered to the best of my ability.  Encouraged to continue with doxycycline  until finished.  Lung sounds on exam clear throughout. - CBC; Future - Comprehensive metabolic panel with GFR; Future - DG Chest 2 View; Future - Comprehensive metabolic panel with GFR - CBC - SLP modified barium swallow; Future  2. Paroxysmal atrial fibrillation (HCC) - Referral placed while in the hospital  - SR today. Continue with Metoprolol   - Comprehensive metabolic panel with GFR - CBC  3. Acute congestive heart failure, unspecified heart failure type (HCC) - Euvolemic in the office today  - Comprehensive metabolic panel with GFR - CBC  4. AKI (acute kidney injury) (HCC) - Comprehensive metabolic panel with GFR - CBC  5. Hypoalbuminemia - Comprehensive metabolic panel with GFR - CBC  6. Essential hypertension - Controlled. No change in medication   7. Prediabetes - Continue with Metformin    8.  Primary insomnia  - traZODone  (DESYREL ) 50 MG tablet; Take 0.5-1 tablets (25-50 mg total) by mouth at bedtime as needed for sleep.  Dispense: 90 tablet; Refill: 0  9. Dysphagia, unspecified type - Will order swallow study for concern of dysphagia  - SLP modified barium swallow; Future  Darleene Shape, NP

## 2024-05-10 ENCOUNTER — Other Ambulatory Visit (HOSPITAL_COMMUNITY): Payer: Self-pay | Admitting: Adult Health

## 2024-05-10 DIAGNOSIS — R1310 Dysphagia, unspecified: Secondary | ICD-10-CM

## 2024-05-10 DIAGNOSIS — R059 Cough, unspecified: Secondary | ICD-10-CM

## 2024-05-10 LAB — COMPREHENSIVE METABOLIC PANEL WITH GFR
ALT: 45 U/L (ref 0–53)
AST: 46 U/L — ABNORMAL HIGH (ref 0–37)
Albumin: 3.9 g/dL (ref 3.5–5.2)
Alkaline Phosphatase: 149 U/L — ABNORMAL HIGH (ref 39–117)
BUN: 26 mg/dL — ABNORMAL HIGH (ref 6–23)
CO2: 25 meq/L (ref 19–32)
Calcium: 10.4 mg/dL (ref 8.4–10.5)
Chloride: 97 meq/L (ref 96–112)
Creatinine, Ser: 0.86 mg/dL (ref 0.40–1.50)
GFR: 83.2 mL/min (ref >60.00)
Glucose, Bld: 67 mg/dL — ABNORMAL LOW (ref 70–99)
Potassium: 4.8 meq/L (ref 3.5–5.1)
Sodium: 135 meq/L (ref 135–145)
Total Bilirubin: 0.5 mg/dL (ref 0.2–1.2)
Total Protein: 8.7 g/dL — ABNORMAL HIGH (ref 6.0–8.3)

## 2024-05-10 LAB — CBC
HCT: 42.5 % (ref 39.0–52.0)
Hemoglobin: 13.7 g/dL (ref 13.0–17.0)
MCHC: 32.2 g/dL (ref 30.0–36.0)
MCV: 89.6 fl (ref 78.0–100.0)
Platelets: 484 K/uL — ABNORMAL HIGH (ref 150.0–400.0)
RBC: 4.74 Mil/uL (ref 4.22–5.81)
RDW: 15.1 % (ref 11.5–15.5)
WBC: 17.1 K/uL — ABNORMAL HIGH (ref 4.0–10.5)

## 2024-05-11 ENCOUNTER — Ambulatory Visit: Payer: Self-pay | Admitting: Adult Health

## 2024-05-11 ENCOUNTER — Other Ambulatory Visit: Payer: Self-pay | Admitting: Adult Health

## 2024-05-11 DIAGNOSIS — D72829 Elevated white blood cell count, unspecified: Secondary | ICD-10-CM

## 2024-05-11 NOTE — Telephone Encounter (Signed)
 Copied from CRM [id number]. Topic: General - Other >> May 11, 2024  1:34 PM Macario HERO wrote: Reason for CRM: Patient spouse called to ask why he was advised to wait until Monday to do his labs, if his white blood cells are elevated now. Patient spouse is requesting a call back today.

## 2024-05-12 ENCOUNTER — Other Ambulatory Visit

## 2024-05-12 ENCOUNTER — Other Ambulatory Visit: Payer: Self-pay | Admitting: Adult Health

## 2024-05-12 DIAGNOSIS — D72829 Elevated white blood cell count, unspecified: Secondary | ICD-10-CM | POA: Diagnosis not present

## 2024-05-12 LAB — CBC WITH DIFFERENTIAL/PLATELET
Basophils Absolute: 0.1 K/uL (ref 0.0–0.1)
Basophils Relative: 0.8 % (ref 0.0–3.0)
Eosinophils Absolute: 0.2 K/uL (ref 0.0–0.7)
Eosinophils Relative: 1.2 % (ref 0.0–5.0)
HCT: 41.4 % (ref 39.0–52.0)
Hemoglobin: 13.3 g/dL (ref 13.0–17.0)
Lymphocytes Relative: 14 % (ref 12.0–46.0)
Lymphs Abs: 1.9 K/uL (ref 0.7–4.0)
MCHC: 32.1 g/dL (ref 30.0–36.0)
MCV: 88.8 fl (ref 78.0–100.0)
Monocytes Absolute: 0.6 K/uL (ref 0.1–1.0)
Monocytes Relative: 4.1 % (ref 3.0–12.0)
Neutro Abs: 10.8 K/uL — ABNORMAL HIGH (ref 1.4–7.7)
Neutrophils Relative %: 79.9 % — ABNORMAL HIGH (ref 43.0–77.0)
Platelets: 406 K/uL — ABNORMAL HIGH (ref 150.0–400.0)
RBC: 4.66 Mil/uL (ref 4.22–5.81)
RDW: 14.8 % (ref 11.5–15.5)
WBC: 13.5 K/uL — ABNORMAL HIGH (ref 4.0–10.5)

## 2024-05-12 NOTE — Telephone Encounter (Signed)
**Note De-identified  [place] Obfuscation** Please advise 

## 2024-05-15 ENCOUNTER — Other Ambulatory Visit

## 2024-05-16 ENCOUNTER — Ambulatory Visit: Payer: Self-pay | Admitting: Adult Health

## 2024-05-17 NOTE — Progress Notes (Unsigned)
 Chief comoplaint: follow-up for pneumonia  Subjective:    Patient ID: Brandon Dixon, male    DOB: [birth year], 78 y.o.   MRN: [id number]  HPI  Past Medical History:  Diagnosis Date   ALLERGIC RHINITIS 09/26/2007   BENIGN PROSTATIC HYPERTROPHY 05/27/2007   BPH with urinary obstruction    COLONIC POLYPS, HX OF 05/27/2007   DERMATITIS 08/25/2010   GERD 05/27/2007   HYPERCHOLESTEROLEMIA 09/26/2007   Peripheral vascular disease [facility])     Past Surgical History:  Procedure Laterality Date   CYSTOSCOPY  11/10/2022   per [facility], Holmium laser enucleation with morcellation   LOWER EXTREMITY ANGIOGRAM Left 04/30/2014   Procedure: LOWER EXTREMITY ANGIOGRAM;  Surgeon: Lonni GORMAN Blade, MD;  Location: [facility] CATH LAB;  Service: Cardiovascular;  Laterality: Left;   ROTATOR CUFF REPAIR     VASECTOMY     VIDEO BRONCHOSCOPY Bilateral 04/25/2024   Procedure: VIDEO BRONCHOSCOPY WITHOUT FLUORO;  Surgeon: Jude Harden GAILS, MD;  Location: [facility] ENDOSCOPY;  Service: Cardiopulmonary;  Laterality: Bilateral;    Family History  Problem Relation Age of Onset   Heart disease Mother    Heart attack Mother        23    Cancer Father        lung ca   Hypertension Sister    Diabetes Brother    Hypertension Sister    Diabetes Brother    Diabetes Brother    Diabetes Brother    Diabetes Brother    Diabetes Daughter       Social History   Socioeconomic History   Marital status: Married    Spouse name: Not on file   Number of children: Not on file   Years of education: Not on file   Highest education level: Some college, no degree  Occupational History   Not on file  Tobacco Use   Smoking status: Former    Current packs/day: 0.00    Types: Cigarettes    Quit date: 10/19/2001    Years since quitting: 22.5   Smokeless tobacco: Never  Vaping Use   Vaping status: Never Used  Substance and Sexual Activity   Alcohol use: Yes    Alcohol/week: 6.0 standard drinks of alcohol    Types: 6 Cans  of beer per week   Drug use: No   Sexual activity: Not on file  Other Topics Concern   Not on file  Social History Narrative   Works as a Patent attorney    Married    Three daughters    Social Drivers of Corporate investment banker Strain: Low Risk  (08/13/2023)   Overall Financial Resource Strain (CARDIA)    Difficulty of Paying Living Expenses: Not hard at all  Food Insecurity: No Food Insecurity (05/01/2024)   Hunger Vital Sign    Worried About Running Out of Food in the Last Year: Never true    Ran Out of Food in the Last Year: Never true  Transportation Needs: No Transportation Needs (05/01/2024)   PRAPARE - Administrator, Civil Service (Medical): No    Lack of Transportation (Non-Medical): No  Physical Activity: Sufficiently Active (08/13/2023)   Exercise Vital Sign    Days of Exercise per Week: 7 days    Minutes of Exercise per Session: 60 min  Stress: No Stress Concern Present (08/13/2023)   [employer] of Occupational Health - Occupational Stress Questionnaire    Feeling of Stress : Not at all  Social Connections: Socially  Integrated (05/01/2024)   Social Connection and Isolation Panel    Frequency of Communication with Friends and Family: More than three times a week    Frequency of Social Gatherings with Friends and Family: More than three times a week    Attends Religious Services: More than 4 times per year    Active Member of [employer] or Organizations: Yes    Attends Engineer, structural: More than 4 times per year    Marital Status: Married    Allergies  Allergen Reactions   Augmentin  [Amoxicillin -Pot Clavulanate] Diarrhea    Adverse reaction with diarrhea, decreased PO intake      Current Outpatient Medications:    aspirin  EC 81 MG tablet, Take 81 mg by mouth daily., Disp: , Rfl:    atorvastatin  (LIPITOR) 80 MG tablet, TAKE 1 TABLET EVERY EVENING, Disp: 90 tablet, Rfl: 3   guaiFENesin -dextromethorphan (ROBITUSSIN DM) 100-10 MG/5ML  syrup, Take 10 mLs by mouth every 8 (eight) hours as needed for cough., Disp: 118 mL, Rfl: 0   hydrocortisone ointment 0.5 %, Apply 1 application topically daily as needed for itching., Disp: , Rfl:    metFORMIN  (GLUCOPHAGE -XR) 500 MG 24 hr tablet, Take 1 tablet by mouth once daily with breakfast, Disp: 90 tablet, Rfl: 0   metoprolol  tartrate (LOPRESSOR ) 25 MG tablet, Take 1 tablet (25 mg total) by mouth 2 (two) times daily., Disp: 60 tablet, Rfl: 11   Multiple Vitamin (MULTI-VITAMIN) tablet, Take 1 tablet by mouth daily., Disp: , Rfl:    olmesartan  (BENICAR ) 5 MG tablet, TAKE 1 TABLET EVERY DAY, Disp: 90 tablet, Rfl: 3   tadalafil  (CIALIS ) 20 MG tablet, Take 0.5-1 tablets (10-20 mg total) by mouth every other day as needed for erectile dysfunction., Disp: 10 tablet, Rfl: 11   traZODone  (DESYREL ) 50 MG tablet, Take 0.5-1 tablets (25-50 mg total) by mouth at bedtime as needed for sleep., Disp: 90 tablet, Rfl: 0    Review of Systems     Objective:   Physical Exam        Assessment & Plan:

## 2024-05-17 NOTE — Telephone Encounter (Signed)
 Copied from CRM [id number]. Topic: General - Other >> May 17, 2024 12:22 PM Aisha D wrote: Reason for CRM: Pt would like to know if he is able to get a personal trainer and start body building. Pt would like to build his muscle mass back up and his energy. Pt wanted to know if Darleene Merna PIETY, approves of this and would like a callback with an update.

## 2024-05-18 ENCOUNTER — Encounter: Payer: Self-pay | Admitting: Infectious Disease

## 2024-05-18 ENCOUNTER — Other Ambulatory Visit: Payer: Self-pay

## 2024-05-18 ENCOUNTER — Ambulatory Visit: Payer: Self-pay | Admitting: Infectious Disease

## 2024-05-18 VITALS — BP 133/74 | HR 68 | Temp 97.6°F | Ht 72.0 in | Wt 163.0 lb

## 2024-05-18 DIAGNOSIS — R0781 Pleurodynia: Secondary | ICD-10-CM | POA: Diagnosis not present

## 2024-05-18 DIAGNOSIS — J189 Pneumonia, unspecified organism: Secondary | ICD-10-CM

## 2024-05-18 DIAGNOSIS — M353 Polymyalgia rheumatica: Secondary | ICD-10-CM | POA: Diagnosis not present

## 2024-05-18 DIAGNOSIS — R7401 Elevation of levels of liver transaminase levels: Secondary | ICD-10-CM | POA: Diagnosis not present

## 2024-05-22 ENCOUNTER — Telehealth: Payer: Self-pay

## 2024-05-22 NOTE — Telephone Encounter (Signed)
 Received voicemail from patient requesting results from 7/31 appointment, results still pending.   Brandon Dixon, BSN, RN

## 2024-05-23 ENCOUNTER — Other Ambulatory Visit

## 2024-05-23 ENCOUNTER — Ambulatory Visit (INDEPENDENT_AMBULATORY_CARE_PROVIDER_SITE_OTHER)
Admission: RE | Admit: 2024-05-23 | Discharge: 2024-05-23 | Disposition: A | Discharge status: Home / Self Care | Source: Ambulatory Visit | Attending: Adult Health | Admitting: Adult Health

## 2024-05-23 DIAGNOSIS — R0789 Other chest pain: Secondary | ICD-10-CM | POA: Diagnosis not present

## 2024-05-23 DIAGNOSIS — J189 Pneumonia, unspecified organism: Secondary | ICD-10-CM

## 2024-05-23 DIAGNOSIS — I10 Essential (primary) hypertension: Secondary | ICD-10-CM | POA: Diagnosis not present

## 2024-05-23 DIAGNOSIS — R918 Other nonspecific abnormal finding of lung field: Secondary | ICD-10-CM | POA: Diagnosis not present

## 2024-05-24 ENCOUNTER — Telehealth: Payer: Self-pay | Admitting: *Deleted

## 2024-05-24 LAB — Q-FEVER AB IGG REFLEX TO TITER
Q FEVER IGM PHASE I SCR: NEGATIVE
Q fever IgG phase I screen 1: NEGATIVE
Q fever IgG phase I screen 2: NEGATIVE
Q fever IgM phase II screen 2: NEGATIVE

## 2024-05-24 LAB — BRUCELLA IGG, IGM
Brucella, IgG: 0.13
Brucella, IgM: 0.08

## 2024-05-24 NOTE — Telephone Encounter (Signed)
 Copied from CRM #[id number]. Topic: Referral - Status >> May 24, 2024  8:17 AM Mia F wrote: Reason for CRM: Pt is calling to check the status  of his referral for cardiology. There is a referral in the system but in a pending status. Please contact pt when referral is complete or appt has been made for him. Pt says he is taking two medications until he gets in with cardiology. He does not want to keep taking the meds if he does not need to.Pt is also available via My Chart.

## 2024-05-24 NOTE — Telephone Encounter (Signed)
 Patient is aware

## 2024-05-25 ENCOUNTER — Ambulatory Visit: Payer: Self-pay

## 2024-05-25 LAB — FUNGAL ORGANISM REFLEX

## 2024-05-25 LAB — FUNGUS CULTURE WITH STAIN

## 2024-05-25 LAB — FUNGUS CULTURE RESULT

## 2024-05-30 ENCOUNTER — Ambulatory Visit (HOSPITAL_COMMUNITY)
Admission: RE | Admit: 2024-05-30 | Discharge: 2024-05-30 | Disposition: A | Discharge status: Home / Self Care | Source: Ambulatory Visit | Attending: Diagnostic Radiology | Admitting: Diagnostic Radiology

## 2024-05-30 ENCOUNTER — Ambulatory Visit (HOSPITAL_COMMUNITY)
Admission: RE | Admit: 2024-05-30 | Discharge: 2024-05-30 | Disposition: A | Discharge status: Home / Self Care | Source: Ambulatory Visit | Attending: *Deleted | Admitting: *Deleted

## 2024-05-30 ENCOUNTER — Ambulatory Visit: Payer: Self-pay | Admitting: Adult Health

## 2024-05-30 DIAGNOSIS — R1312 Dysphagia, oropharyngeal phase: Secondary | ICD-10-CM | POA: Diagnosis not present

## 2024-05-30 DIAGNOSIS — R059 Cough, unspecified: Secondary | ICD-10-CM | POA: Insufficient documentation

## 2024-05-30 DIAGNOSIS — R1310 Dysphagia, unspecified: Secondary | ICD-10-CM

## 2024-05-30 DIAGNOSIS — J189 Pneumonia, unspecified organism: Secondary | ICD-10-CM

## 2024-05-31 MED ORDER — OMEPRAZOLE 20 MG PO CPDR: 20.0000 mg | DELAYED_RELEASE_CAPSULE | Freq: Every day | ORAL | 1 refills | Status: DC

## 2024-06-01 ENCOUNTER — Telehealth: Payer: Self-pay

## 2024-06-01 DIAGNOSIS — R29898 Other symptoms and signs involving the musculoskeletal system: Secondary | ICD-10-CM

## 2024-06-01 NOTE — Telephone Encounter (Signed)
**Note De-identified  [place] Obfuscation** Please advise 

## 2024-06-01 NOTE — Telephone Encounter (Signed)
 Copied from CRM [id number]. Topic: General - Other >> Jun 01, 2024 10:06 AM Marissa P wrote: Reason for CRM: Patient wanted to see if pcp can do a referral to [facility] or it goes by ([facility] on [facility]) on [address] for [employer]. Lost a lot of weight and muscle mass from the pneumonia and wanted to start a process to get that back.  Please advise and call patient

## 2024-06-01 NOTE — Telephone Encounter (Signed)
 Referral placed and pt notified of update.

## 2024-06-08 LAB — ACID FAST CULTURE WITH REFLEXED SENSITIVITIES (MYCOBACTERIA)
Acid Fast Culture: NEGATIVE
Acid Fast Culture: NEGATIVE

## 2024-06-08 NOTE — Telephone Encounter (Signed)
 Patient was waiting for a call from [facility] on [address] because the office sent over the referral. He just spoke with them and they have returned it to the office and they suggested that the patient goes to [facility] instead. Please call back and advise the patient.

## 2024-06-09 LAB — ACID FAST CULTURE WITH REFLEXED SENSITIVITIES (MYCOBACTERIA): Acid Fast Culture: NEGATIVE

## 2024-06-09 NOTE — Telephone Encounter (Signed)
 Spoke to pt and he stated that when he called [facility] they stated that they could not assist pt. They advised pt that he should go to [facility]. Pt stated that it doesn't matter if he goes to [facility].

## 2024-06-12 ENCOUNTER — Ambulatory Visit: Payer: Self-pay

## 2024-06-12 NOTE — Telephone Encounter (Signed)
 FYI Only or Action Required?: Action required by provider: clinical question for provider.  Patient was last seen in primary care on 05/09/2024 by Merna Huxley, NP.   Copied from CRM #[id number]. Topic: Clinical - Medical Advice >> Jun 12, 2024  9:47 AM Viola F wrote: Reason for CRM: Patient tested positive for covid on Sunday, he still has cough an wants to know what he can take for it. He wants to make sure that his current medications will not interfere with medication for cough/covid. [phone] (M)

## 2024-06-14 ENCOUNTER — Telehealth (INDEPENDENT_AMBULATORY_CARE_PROVIDER_SITE_OTHER): Admitting: Internal Medicine

## 2024-06-14 ENCOUNTER — Telehealth: Admitting: Family Medicine

## 2024-06-14 ENCOUNTER — Telehealth: Payer: Self-pay

## 2024-06-14 ENCOUNTER — Encounter: Payer: Self-pay | Admitting: Internal Medicine

## 2024-06-14 VITALS — BP 136/77 | Ht 72.0 in | Wt 162.0 lb

## 2024-06-14 DIAGNOSIS — Z8701 Personal history of pneumonia (recurrent): Secondary | ICD-10-CM

## 2024-06-14 DIAGNOSIS — U071 COVID-19: Secondary | ICD-10-CM | POA: Diagnosis not present

## 2024-06-14 MED ORDER — NIRMATRELVIR/RITONAVIR (PAXLOVID)TABLET
ORAL_TABLET | ORAL | 0 refills | Status: DC
Start: 2024-06-14 — End: 2024-06-14

## 2024-06-14 MED ORDER — NIRMATRELVIR/RITONAVIR (PAXLOVID)TABLET: ORAL_TABLET | ORAL | 0 refills | Status: DC

## 2024-06-14 NOTE — Progress Notes (Signed)
 Virtual Visit via Video Note  I connected with Brandon Dixon on 06/14/24 at  4:00 PM EDT by a video enabled telemedicine application and verified that I am speaking with the correct person using two identifiers. Location patient: home Location provider:work office Persons participating in the virtual visit: patient, provider   Patient aware  of the limitations of evaluation and management by telemedicine and  availability of in person appointments. and agreed to proceed.   HPI: Brandon Dixon presents for video visit Tested pos covid 19 3 days ago when had ur cold like sx  no fever and wife also  got sick first . He is recovering from hosp for r pna of unclear  cause . Has fu cardiology also next week.  No new cp sob  nvd or resp distress.  ROS: See pertinent positives and negatives per HPI.  Past Medical History:  Diagnosis Date   ALLERGIC RHINITIS 09/26/2007   BENIGN PROSTATIC HYPERTROPHY 05/27/2007   BPH with urinary obstruction    COLONIC POLYPS, HX OF 05/27/2007   DERMATITIS 08/25/2010   GERD 05/27/2007   HYPERCHOLESTEROLEMIA 09/26/2007   Peripheral vascular disease [facility])     Past Surgical History:  Procedure Laterality Date   CYSTOSCOPY  11/10/2022   per [facility], Holmium laser enucleation with morcellation   LOWER EXTREMITY ANGIOGRAM Left 04/30/2014   Procedure: LOWER EXTREMITY ANGIOGRAM;  Surgeon: Lonni GORMAN Blade, MD;  Location: [facility] CATH LAB;  Service: Cardiovascular;  Laterality: Left;   ROTATOR CUFF REPAIR     VASECTOMY     VIDEO BRONCHOSCOPY Bilateral 04/25/2024   Procedure: VIDEO BRONCHOSCOPY WITHOUT FLUORO;  Surgeon: Jude Harden GAILS, MD;  Location: [facility] ENDOSCOPY;  Service: Cardiopulmonary;  Laterality: Bilateral;    Family History  Problem Relation Age of Onset   Heart disease Mother    Heart attack Mother        53    Cancer Father        lung ca   Hypertension Sister    Diabetes Brother    Hypertension Sister    Diabetes Brother     Diabetes Brother    Diabetes Brother    Diabetes Brother    Diabetes Daughter     Social History   Tobacco Use   Smoking status: Former    Current packs/day: 0.00    Types: Cigarettes    Quit date: 10/19/2001    Years since quitting: 22.6   Smokeless tobacco: Never  Vaping Use   Vaping status: Never Used  Substance Use Topics   Alcohol use: Yes    Alcohol/week: 6.0 standard drinks of alcohol    Types: 6 Cans of beer per week   Drug use: No      Current Outpatient Medications:    aspirin  EC 81 MG tablet, Take 81 mg by mouth daily., Disp: , Rfl:    atorvastatin  (LIPITOR) 80 MG tablet, TAKE 1 TABLET EVERY EVENING, Disp: 90 tablet, Rfl: 3   hydrocortisone ointment 0.5 %, Apply 1 application topically daily as needed for itching., Disp: , Rfl:    metFORMIN  (GLUCOPHAGE -XR) 500 MG 24 hr tablet, Take 1 tablet by mouth once daily with breakfast, Disp: 90 tablet, Rfl: 0   metoprolol  tartrate (LOPRESSOR ) 25 MG tablet, Take 1 tablet (25 mg total) by mouth 2 (two) times daily., Disp: 60 tablet, Rfl: 11   Multiple Vitamin (MULTI-VITAMIN) tablet, Take 1 tablet by mouth daily., Disp: , Rfl:    nirmatrelvir /ritonavir  (PAXLOVID ) 20 x 150 MG &  10 x 100MG  TABS, Patient GFR is 83  Take nirmatrelvir  (150 mg) 2 tablet(s) twice daily for 5 days and ritonavir  (100 mg) one tablet twice daily for 5 days.take  Medication together., Disp: 30 tablet, Rfl: 0   olmesartan  (BENICAR ) 5 MG tablet, TAKE 1 TABLET EVERY DAY, Disp: 90 tablet, Rfl: 3   omeprazole  (PRILOSEC) 20 MG capsule, Take 1 capsule (20 mg total) by mouth daily., Disp: 90 capsule, Rfl: 1   tadalafil  (CIALIS ) 20 MG tablet, Take 0.5-1 tablets (10-20 mg total) by mouth every other day as needed for erectile dysfunction., Disp: 10 tablet, Rfl: 11   traZODone  (DESYREL ) 50 MG tablet, Take 0.5-1 tablets (25-50 mg total) by mouth at bedtime as needed for sleep., Disp: 90 tablet, Rfl: 0  EXAM: BP Readings from Last 3 Encounters:  06/14/24 136/77   05/18/24 133/74  05/09/24 122/60    VITALS per patient if applicable:  GENERAL: alert, oriented, appears well and in no acute distress non toxic mild congestion   nl speech and color   HEENT: atraumatic, conjunttiva clear, no obvious abnormalities on inspection of external nose and ears  NECK: normal movements of the head and neck  LUNGS: on inspection no signs of respiratory distress, breathing rate appears normal, no obvious gross SOB, gasping or wheezing no cough or dyspnea nl speech  CV: no obvious cyanosis  MS: moves all visible extremities without noticeable abnormality  PSYCH/NEURO: pleasant and cooperative, no obvious depression or anxiety, speech and thought processing grossly intact Lab Results  Component Value Date   WBC 13.5 (H) 05/12/2024   HGB 13.3 05/12/2024   HCT 41.4 05/12/2024   PLT 406.0 (H) 05/12/2024   GLUCOSE 67 (L) 05/09/2024   CHOL 176 12/15/2023   TRIG 82.0 12/15/2023   HDL 77.50 12/15/2023   LDLDIRECT 193.1 09/22/2006   LDLCALC 82 12/15/2023   ALT 45 05/09/2024   AST 46 (H) 05/09/2024   NA 135 05/09/2024   K 4.8 05/09/2024   CL 97 05/09/2024   CREATININE 0.86 05/09/2024   BUN 26 (H) 05/09/2024   CO2 25 05/09/2024   TSH 0.742 04/20/2024   PSA 0.21 12/15/2023   INR 0.9 01/16/2008   HGBA1C 6.6 (H) 04/20/2024    ASSESSMENT AND PLAN:  Discussed the following assessment and plan:    ICD-10-CM   1. COVID-19 virus infection  U07.1     2. History of community acquired pneumonia  Z87.01      High risk  conditions , recovering from hospitalization  for r ul pna  renal function ok day5 of sx mild but  benefit more than risk felt to begin antivirals  Never had covid 19 infection before   but  has had mult covid vaccines ;last one 9 2024  Sx are mild but  underlying risk  Advise antiviral  paxlovid   because  of risk and no CI  Can ask pharmacy about drug ina but  maybe atorvastatin    others ok .contact is alarm sx  and send in message next week  about how doing  Counseled.   Expectant management and discussion of plan and treatment with opportunity to ask questions and all were answered. The patient agreed with the plan and demonstrated an understanding of the instructions.   Advised to call back or seek an in-person evaluation if worsening  or having  further concerns  in interim. Nad update next week  about status  Return if symptoms worsen or fail to improve, for and message  about how  doing next week .  Apolinar Eastern, MD

## 2024-06-14 NOTE — Telephone Encounter (Unsigned)
 Copied from CRM #[id number]. Topic: Clinical - Prescription Issue >> Jun 14, 2024  4:59 PM Maisie C wrote: Reason for CRM: [facility] pharmacist called to let the nurse know that they have been out of Paxloid and mostly other [facility] are out of stock as well. They encouraged the patient to call other pharmacies to see if they have it in stock. They asked for a nurse to call them and confirm when they send the rx somewhere else because they will be putting it on hold for now. Please advise.

## 2024-06-14 NOTE — Telephone Encounter (Signed)
 Pt has been scheduled.

## 2024-06-15 ENCOUNTER — Ambulatory Visit

## 2024-06-15 ENCOUNTER — Other Ambulatory Visit: Payer: Self-pay | Admitting: Adult Health

## 2024-06-15 ENCOUNTER — Telehealth: Payer: Self-pay | Admitting: *Deleted

## 2024-06-15 MED ORDER — MOLNUPIRAVIR EUA 200MG CAPSULE: 4.0000 | ORAL_CAPSULE | Freq: Two times a day (BID) | ORAL | 0 refills | Status: AC

## 2024-06-15 NOTE — Telephone Encounter (Signed)
Molnupiravir sent to pharmacy

## 2024-06-15 NOTE — Telephone Encounter (Signed)
 Copied from CRM [id number]. Topic: Clinical - Prescription Issue >> Jun 15, 2024  9:31 AM Martinique E wrote: Reason for CRM: Patient stated that he was prescribed nirmatrelvir /ritonavir  (PAXLOVID ) 20 x 150 MG & 10 x 100MG  TABS and that medication will be almost $300 out of pocket and patient is questioning if there is an alternative medication for him to take.

## 2024-06-16 ENCOUNTER — Other Ambulatory Visit: Payer: Self-pay

## 2024-06-16 ENCOUNTER — Other Ambulatory Visit (HOSPITAL_COMMUNITY): Payer: Self-pay

## 2024-06-16 ENCOUNTER — Other Ambulatory Visit: Payer: Self-pay | Admitting: Adult Health

## 2024-06-16 NOTE — Telephone Encounter (Signed)
 Spoke to pt and advised that per insurance his medication is not covered. Pt stated that he is feeling better.

## 2024-06-22 ENCOUNTER — Ambulatory Visit: Attending: Adult Health

## 2024-06-22 ENCOUNTER — Other Ambulatory Visit: Payer: Self-pay

## 2024-06-22 DIAGNOSIS — R29898 Other symptoms and signs involving the musculoskeletal system: Secondary | ICD-10-CM | POA: Diagnosis not present

## 2024-06-22 DIAGNOSIS — M6281 Muscle weakness (generalized): Secondary | ICD-10-CM | POA: Insufficient documentation

## 2024-06-22 DIAGNOSIS — R2689 Other abnormalities of gait and mobility: Secondary | ICD-10-CM | POA: Insufficient documentation

## 2024-06-22 NOTE — Therapy (Signed)
 OUTPATIENT PHYSICAL THERAPY LOWER EXTREMITY EVALUATION   Patient Name: Brandon Dixon MRN: [id number] DOB:02/07/1946, 78 y.o., male Today's Date: 06/26/2024  END OF SESSION:  PT End of Session - 06/26/24 1043     Visit Number 1    Number of Visits 17    Date for PT Re-Evaluation 08/17/24    Authorization Type [employer] MCR    PT Start Time 1315    PT Stop Time 1358    PT Time Calculation (min) 43 min    Activity Tolerance Patient tolerated treatment well    Behavior During Therapy [facility] for tasks assessed/performed          Past Medical History:  Diagnosis Date   ALLERGIC RHINITIS 09/26/2007   BENIGN PROSTATIC HYPERTROPHY 05/27/2007   BPH with urinary obstruction    COLONIC POLYPS, HX OF 05/27/2007   DERMATITIS 08/25/2010   GERD 05/27/2007   HYPERCHOLESTEROLEMIA 09/26/2007   Peripheral vascular disease [facility])    Past Surgical History:  Procedure Laterality Date   CYSTOSCOPY  11/10/2022   per [facility], Holmium laser enucleation with morcellation   LOWER EXTREMITY ANGIOGRAM Left 04/30/2014   Procedure: LOWER EXTREMITY ANGIOGRAM;  Surgeon: Lonni GORMAN Blade, MD;  Location: [facility] CATH LAB;  Service: Cardiovascular;  Laterality: Left;   ROTATOR CUFF REPAIR     VASECTOMY     VIDEO BRONCHOSCOPY Bilateral 04/25/2024   Procedure: VIDEO BRONCHOSCOPY WITHOUT FLUORO;  Surgeon: Jude Harden GAILS, MD;  Location: [facility] ENDOSCOPY;  Service: Cardiopulmonary;  Laterality: Bilateral;   Patient Active Problem List   Diagnosis Date Noted   PMR (polymyalgia rheumatica) (HCC) 04/27/2024   Leukocytosis 04/24/2024   Acute hypoxic respiratory failure (HCC) 04/20/2024   Sepsis (HCC) 04/20/2024   A-fib (HCC) 04/20/2024   AKI (acute kidney injury) (HCC) 04/20/2024   Transaminitis 04/20/2024   Hyperglycemia 04/20/2024   Pneumonia of right lower lobe due to infectious organism 04/19/2024   Atherosclerosis of native arteries of extremity with intermittent claudication (HCC) 04/18/2014   Left leg  claudication (HCC) 04/05/2014   Polymyalgia rheumatica (HCC) 02/26/2014   DERMATITIS 08/25/2010   HYPERCHOLESTEROLEMIA 09/26/2007   Allergic rhinitis 09/26/2007   GERD 05/27/2007   BPH (benign prostatic hyperplasia) 05/27/2007   History of colonic polyps 05/27/2007    PCP: Merna Huxley, NP  REFERRING PROVIDER: Merna Huxley, NP  REFERRING DIAG: R29.898 (ICD-10-CM) - Muscular deconditioning   THERAPY DIAG:  Muscle weakness (generalized)  Other abnormalities of gait and mobility  Rationale for Evaluation and Treatment: Rehabilitation  ONSET DATE: July 2025  SUBJECTIVE:   SUBJECTIVE STATEMENT: Pt presents to PT with reports of decrease in muscle mass and strength as well as overall deconditioning follow pneumonia and hospitalization in July 2025. Is very active and tries to get 10,000 steps a day, wants to get back to being very active and not feeling limited. Does have some bilateral knee discomfort, L>R, that has been present since before hospitalization.   PERTINENT HISTORY: See PMH  PAIN:  Are you having pain?  Yes: NPRS scale: 4/10 Worst: 8/10 Pain location: bilateral anterior knee, L>R Pain description: sharp, sore Aggravating factors: stairs, squatting  Relieving factors: rest  PRECAUTIONS: None  RED FLAGS: None   WEIGHT BEARING RESTRICTIONS: No  FALLS:  Has patient fallen in last 6 months? No  LIVING ENVIRONMENT: Lives with: lives with their spouse Lives in: House/apartment  OCCUPATION: Retired  PLOF: Independent  PATIENT GOALS: get his strength and endurance back   NEXT MD VISIT: 08/14/24  OBJECTIVE:  Note:  Objective measures were completed at Evaluation unless otherwise noted.  DIAGNOSTIC FINDINGS: N/A  PATIENT SURVEYS:  DNT  COGNITION: Overall cognitive status: Within functional limits for tasks assessed     SENSATION: WFL  POSTURE: rounded shoulders and forward head  PALPATION: No overt TTP noted  UPPER EXTREMITY  MMT:  MMT Right eval Left eval  Shoulder flexion 3+ 3+  Shoulder extension    Shoulder abduction 3+ 3+  Shoulder adduction    Shoulder extension    Shoulder internal rotation 4 4  Shoulder external rotation 3+ 3+  Middle trapezius    Lower trapezius    Elbow flexion    Elbow extension    Wrist flexion    Wrist extension    Wrist ulnar deviation    Wrist radial deviation    Wrist pronation    Wrist supination    Grip strength     (Blank rows = not tested)  LOWER EXTREMITY MMT:  MMT Right eval Left eval  Hip flexion 5 5  Hip extension    Hip abduction 4 4  Hip adduction    Hip internal rotation    Hip external rotation    Knee flexion 5 5  Knee extension 5 5  Ankle dorsiflexion    Ankle plantarflexion    Ankle inversion    Ankle eversion     (Blank rows = not tested)  LOWER EXTREMITY SPECIAL TESTS:  DNT  FUNCTIONAL TESTS:  : 1367ft 30 Second Sit to Stand: 14 reps no UE  GAIT: Distance walked: see Assistive device utilized: None Level of assistance: Complete Independence Comments: increased trunk flexion and decrease L heel strike with increased distance  TREATMENT: [facility] Adult PT Treatment:                                                DATE: 06/22/2024 Therapeutic Exercise: S/L hip abd x 10 ea Supine QS x 5 - 5 hold Supine SLR x 10 ea Banded bridge x 10 GTB STS x 10 Seated bilateral ER x 10 GTB Standing row x 10 black band  PATIENT EDUCATION:  Education details: eval findings, HEP, POC Person educated: Patient Education method: Explanation, Demonstration, and Handouts Education comprehension: verbalized understanding and returned demonstration  HOME EXERCISE PROGRAM: Access Code: [email or web address] URL: [email or web address] Date: 06/22/2024 Prepared by: Alm Kingdom  Exercises - Sidelying Hip Abduction  - 1 x daily - 7 x weekly - 3 sets - 10 reps - Supine Quadricep Sets  - 1 x daily - 7 x weekly - 2 sets - 10 reps - 5 sec  hold - Active Straight Leg Raise with Quad Set  - 1 x daily - 7 x weekly - 3 sets - 10 reps - Supine Bridge with Resistance Band  - 1 x daily - 7 x weekly - 3 sets - 10 reps - green band hold - Sit to Stand Without Arm Support  - 1 x daily - 7 x weekly - 3 sets - 10 reps - Shoulder External Rotation and Scapular Retraction with Resistance  - 1 x daily - 7 x weekly - 3 sets - 10 reps - red band hold - Standing Shoulder Row with Anchored Resistance  - 1 x daily - 7 x weekly - 3 sets - 12 reps - black band hold  ASSESSMENT:  CLINICAL IMPRESSION: Patient is  a 78 y.o. M who was seen today for physical therapy evaluation and treatment for weakness and deconditioning below PLOF after hospitalization secondary to pneumonia in July 2025. Physical findings are consistent with referring provider impression as pt demonstrates decrease in UE/LE strength as well as decrease in below age-related normative value. He would benefit from skilled PT services working on improving overall strength in order to get back to baseline PLOF.    OBJECTIVE IMPAIRMENTS: Abnormal gait, decreased activity tolerance, decreased endurance, decreased mobility, difficulty walking, decreased ROM, decreased strength, and pain  ACTIVITY LIMITATIONS: carrying, lifting, standing, squatting, stairs, transfers, and locomotion level  PARTICIPATION LIMITATIONS: meal prep, cleaning, community activity, and yard work  PERSONAL FACTORS: Time since onset of injury/illness/exacerbation are also affecting patient's functional outcome.   REHAB POTENTIAL: Excellent  CLINICAL DECISION MAKING: Stable/uncomplicated  EVALUATION COMPLEXITY: Low   GOALS: Goals reviewed with patient? No  SHORT TERM GOALS: Target date: 07/13/2024   Pt will be compliant and knowledgeable with initial HEP for improved comfort and carryover Baseline: initial HEP given  Goal status: INITIAL  2.  Pt will self report bilateral knee pain no greater than 1-2/10  for improved comfort and functional ability Baseline: 8/10 at worst Goal status: INITIAL   LONG TERM GOALS: Target date: 08/17/2024  Pt will improve all UE/LE MMT to no less than 5/5 for improved comfort and functional ability Baseline: see chart Goal status: INITIAL  2.  Pt will improve to 1641ft for improved functional endurance working closer towards getting to age related normative value and improved community activity endurance Baseline: 1322ft Goal status: INITIAL  3.  Pt will increase 30 Second Sit to Stand rep count to no less than 16 reps for improved balance, strength, and functional mobility Baseline: 14 reps  Goal status: INITIAL   4.  Pt will be compliant and knowledgeable with initial HEP for improved comfort and carryover Baseline: initial HEP given  Goal status: INITIAL   PLAN:  PT FREQUENCY: 1-2x/week  PT DURATION: 8 weeks  PLANNED INTERVENTIONS: 97164- PT Re-evaluation, 97110-Therapeutic exercises, 97530- Therapeutic activity, [id number]- Neuromuscular re-education, 97535- Self Care, 02859- Manual therapy, [id number]- Gait training, [phone]- Electrical stimulation (unattended), [id number]- Electrical stimulation (manual), 20560 (1-2 muscles), 20561 (3+ muscles)- Dry Needling, Cryotherapy, and Moist heat  PLAN FOR NEXT SESSION: assess HEP response, global strengthening, improving endurance and functional activity tolerance   Alm JAYSON Kingdom, PT 06/26/2024, 10:43 AM  Referring diagnosis? R29.898 (ICD-10-CM) - Muscular deconditioning  Treatment diagnosis? (if different than referring diagnosis)  Muscle weakness (generalized) Other abnormalities of gait and mobility  What was this (referring dx) caused by? []  Surgery []  Fall []  Ongoing issue []  Arthritis [x]  Other: hospital stay from pneumonia  Laterality: []  Rt []  Lt [x]  Both  Check all possible CPT codes:  *CHOOSE 10 OR LESS*    See Planned Interventions listed in the Plan section of the Evaluation.

## 2024-06-30 ENCOUNTER — Other Ambulatory Visit (HOSPITAL_COMMUNITY): Payer: Self-pay

## 2024-07-10 ENCOUNTER — Ambulatory Visit: Admitting: Physical Therapy

## 2024-07-10 ENCOUNTER — Encounter: Payer: Self-pay | Admitting: Physical Therapy

## 2024-07-10 DIAGNOSIS — R29898 Other symptoms and signs involving the musculoskeletal system: Secondary | ICD-10-CM | POA: Diagnosis not present

## 2024-07-10 DIAGNOSIS — R2689 Other abnormalities of gait and mobility: Secondary | ICD-10-CM

## 2024-07-10 DIAGNOSIS — M6281 Muscle weakness (generalized): Secondary | ICD-10-CM | POA: Diagnosis not present

## 2024-07-10 NOTE — Therapy (Addendum)
 "  PHYSICAL THERAPY UNPLANNED DISCHARGE SUMMARY   Visits from Start of Care: 2  Current functional level related to goals / functional outcomes: Current status unknown   Remaining deficits: Current status unknown   Education / Equipment: Pt has not returned since visit listed below  Patient goals were not assessed. Patient is being discharged due to not returning since the last visit.  (the note below was addended to include the above D/C summary on 10/16/2024)  OUTPATIENT PHYSICAL THERAPY LOWER EXTREMITY DAILY NOTE   Patient Name: MARKEESE BOYAJIAN MRN: [id number] DOB:May 11, 1946, 78 y.o., male Today's Date: 07/10/2024  END OF SESSION:  PT End of Session - 07/10/24 1415     Visit Number 2    Number of Visits 17    Date for Recertification  08/17/24    Authorization Type [employer] MCR    PT Start Time 1415    PT Stop Time 1456    PT Time Calculation (min) 41 min    Activity Tolerance Patient tolerated treatment well    Behavior During Therapy [facility] for tasks assessed/performed           Past Medical History:  Diagnosis Date   ALLERGIC RHINITIS 09/26/2007   BENIGN PROSTATIC HYPERTROPHY 05/27/2007   BPH with urinary obstruction    COLONIC POLYPS, HX OF 05/27/2007   DERMATITIS 08/25/2010   GERD 05/27/2007   HYPERCHOLESTEROLEMIA 09/26/2007   Peripheral vascular disease    Past Surgical History:  Procedure Laterality Date   CYSTOSCOPY  11/10/2022   per [facility], Holmium laser enucleation with morcellation   LOWER EXTREMITY ANGIOGRAM Left 04/30/2014   Procedure: LOWER EXTREMITY ANGIOGRAM;  Surgeon: Lonni GORMAN Blade, MD;  Location: [facility] CATH LAB;  Service: Cardiovascular;  Laterality: Left;   ROTATOR CUFF REPAIR     VASECTOMY     VIDEO BRONCHOSCOPY Bilateral 04/25/2024   Procedure: VIDEO BRONCHOSCOPY WITHOUT FLUORO;  Surgeon: Jude Harden GAILS, MD;  Location: [facility] ENDOSCOPY;  Service: Cardiopulmonary;  Laterality: Bilateral;   Patient Active Problem List   Diagnosis  Date Noted   PMR (polymyalgia rheumatica) 04/27/2024   Leukocytosis 04/24/2024   Acute hypoxic respiratory failure (HCC) 04/20/2024   Sepsis (HCC) 04/20/2024   A-fib (HCC) 04/20/2024   AKI (acute kidney injury) (HCC) 04/20/2024   Transaminitis 04/20/2024   Hyperglycemia 04/20/2024   Pneumonia of right lower lobe due to infectious organism 04/19/2024   Atherosclerosis of native arteries of extremity with intermittent claudication (HCC) 04/18/2014   Left leg claudication (HCC) 04/05/2014   Polymyalgia rheumatica 02/26/2014   DERMATITIS 08/25/2010   HYPERCHOLESTEROLEMIA 09/26/2007   Allergic rhinitis 09/26/2007   GERD 05/27/2007   BPH (benign prostatic hyperplasia) 05/27/2007   History of colonic polyps 05/27/2007    PCP: Merna Huxley, NP  REFERRING PROVIDER: Merna Huxley, NP  REFERRING DIAG: R29.898 (ICD-10-CM) - Muscular deconditioning   THERAPY DIAG:  Muscle weakness (generalized)  Other abnormalities of gait and mobility  Rationale for Evaluation and Treatment: Rehabilitation  ONSET DATE: July 2025  SUBJECTIVE:   SUBJECTIVE STATEMENT: 07/10/2024:  Pt reports that he is resuming his normal activities.  Still feeling weak.  EVAL: Pt presents to PT with reports of decrease in muscle mass and strength as well as overall deconditioning follow pneumonia and hospitalization in July 2025. Is very active and tries to get 10,000 steps a day, wants to get back to being very active and not feeling limited. Does have some bilateral knee discomfort, L>R, that has been present since before hospitalization.  PERTINENT HISTORY: See PMH  PAIN:  Are you having pain?  Yes: NPRS scale: 4/10 Worst: 8/10 Pain location: bilateral anterior knee, L>R Pain description: sharp, sore Aggravating factors: stairs, squatting  Relieving factors: rest  PRECAUTIONS: None  RED FLAGS: None   WEIGHT BEARING RESTRICTIONS: No  FALLS:  Has patient fallen in last 6 months? No  LIVING  ENVIRONMENT: Lives with: lives with their spouse Lives in: House/apartment  OCCUPATION: Retired  PLOF: Independent  PATIENT GOALS: get his strength and endurance back   NEXT MD VISIT: 08/14/24  OBJECTIVE:  Note: Objective measures were completed at Evaluation unless otherwise noted.  DIAGNOSTIC FINDINGS: N/A  PATIENT SURVEYS:  DNT  COGNITION: Overall cognitive status: Within functional limits for tasks assessed     SENSATION: WFL  POSTURE: rounded shoulders and forward head  PALPATION: No overt TTP noted  UPPER EXTREMITY MMT:  MMT Right eval Left eval  Shoulder flexion 3+ 3+  Shoulder extension    Shoulder abduction 3+ 3+  Shoulder adduction    Shoulder extension    Shoulder internal rotation 4 4  Shoulder external rotation 3+ 3+  Middle trapezius    Lower trapezius    Elbow flexion    Elbow extension    Wrist flexion    Wrist extension    Wrist ulnar deviation    Wrist radial deviation    Wrist pronation    Wrist supination    Grip strength     (Blank rows = not tested)  LOWER EXTREMITY MMT:  MMT Right eval Left eval  Hip flexion 5 5  Hip extension    Hip abduction 4 4  Hip adduction    Hip internal rotation    Hip external rotation    Knee flexion 5 5  Knee extension 5 5  Ankle dorsiflexion    Ankle plantarflexion    Ankle inversion    Ankle eversion     (Blank rows = not tested)  LOWER EXTREMITY SPECIAL TESTS:  DNT  FUNCTIONAL TESTS:  : 1359ft 30 Second Sit to Stand: 14 reps no UE  GAIT: Distance walked: see Assistive device utilized: None Level of assistance: Complete Independence Comments: increased trunk flexion and decrease L heel strike with increased distance  TREATMENT: [facility] Adult PT Treatment:                                                DATE: 07/10/24 Therapeutic Exercise: Elliptical - 3 min for warm up - fatigued  Knee ext machine - 10# x10, 20# - 2x10 Knee flexion machine - 35# - 3x10 Low row - 25# -  2x10 High row - 25# - 2x10 Seated press - 20# - 3x10  Therapeutic Activity  STS 15# - 3x10 OH press - 7# - 3x10  [facility] Adult PT Treatment:                                                DATE: 06/22/2024 Therapeutic Exercise: S/L hip abd x 10 ea Supine QS x 5 - 5 hold Supine SLR x 10 ea Banded bridge x 10 GTB STS x 10 Seated bilateral ER x 10 GTB Standing row x 10 black band  PATIENT EDUCATION:  Education  details: eval findings, HEP, POC Person educated: Patient Education method: Explanation, Demonstration, and Handouts Education comprehension: verbalized understanding and returned demonstration  HOME EXERCISE PROGRAM: Access Code: [email or web address] URL: [email or web address] Date: 07/10/2024 Prepared by: Helene Gasmen  Exercises - Sidelying Hip Abduction  - 1 x daily - 3 x weekly - 3 sets - 10 reps - Sit to Stand Without Arm Support  - 1 x daily - 2-3 x weekly - 3 sets - 5-10 reps - Shoulder External Rotation and Scapular Retraction with Resistance  - 1 x daily - 7 x weekly - 3 sets - 10 reps - red band hold - Knee Extension with Weight Machine  - 1 x daily - 2-3 x weekly - 3 sets - 5-10 reps - Hamstring Curl with Weight Machine  - 1 x daily - 2-3 x weekly - 3 sets - 5-10 reps - Seated Row Cable Machine  - 1 x daily - 2-3 x weekly - 3 sets - 5-10 reps - Pec Fly Machine  - 1 x daily - 2-3 x weekly - 3 sets - 5-10 reps - Recumbent Bike  - 1 x daily - 3 x weekly - time 10 - 30 min  ASSESSMENT:  CLINICAL IMPRESSION: 07/10/2024:  Pt doing well.  Looking for guidance on strengthening at the gym.  Reviewed machines and discussed shooting for 5-10 reps w/ 3 sets 2-3 times a week plus 2-3 days of aerobic exercise like bike or elliptical.  No pain reported.  High fatigue with elliptical.  EVAL: Patient is a 78 y.o. M who was seen today for physical therapy evaluation and treatment for weakness and deconditioning below PLOF after hospitalization secondary to pneumonia in July  2025. Physical findings are consistent with referring provider impression as pt demonstrates decrease in UE/LE strength as well as decrease in below age-related normative value. He would benefit from skilled PT services working on improving overall strength in order to get back to baseline PLOF.    OBJECTIVE IMPAIRMENTS: Abnormal gait, decreased activity tolerance, decreased endurance, decreased mobility, difficulty walking, decreased ROM, decreased strength, and pain  ACTIVITY LIMITATIONS: carrying, lifting, standing, squatting, stairs, transfers, and locomotion level  PARTICIPATION LIMITATIONS: meal prep, cleaning, community activity, and yard work  PERSONAL FACTORS: Time since onset of injury/illness/exacerbation are also affecting patient's functional outcome.   REHAB POTENTIAL: Excellent  CLINICAL DECISION MAKING: Stable/uncomplicated  EVALUATION COMPLEXITY: Low   GOALS: Goals reviewed with patient? No  SHORT TERM GOALS: Target date: 07/13/2024   Pt will be compliant and knowledgeable with initial HEP for improved comfort and carryover Baseline: initial HEP given  Goal status: INITIAL  2.  Pt will self report bilateral knee pain no greater than 1-2/10 for improved comfort and functional ability Baseline: 8/10 at worst Goal status: INITIAL   LONG TERM GOALS: Target date: 08/17/2024  Pt will improve all UE/LE MMT to no less than 5/5 for improved comfort and functional ability Baseline: see chart Goal status: INITIAL  2.  Pt will improve to 1659ft for improved functional endurance working closer towards getting to age related normative value and improved community activity endurance Baseline: 1358ft Goal status: INITIAL  3.  Pt will increase 30 Second Sit to Stand rep count to no less than 16 reps for improved balance, strength, and functional mobility Baseline: 14 reps  Goal status: INITIAL   4.  Pt will be compliant and knowledgeable with initial HEP for  improved comfort and carryover Baseline: initial HEP given  Goal status:  INITIAL   PLAN:  PT FREQUENCY: 1-2x/week  PT DURATION: 8 weeks  PLANNED INTERVENTIONS: 97164- PT Re-evaluation, 97110-Therapeutic exercises, 97530- Therapeutic activity, [id number]- Neuromuscular re-education, 97535- Self Care, 02859- Manual therapy, [id number]- Gait training, [phone]- Electrical stimulation (unattended), [phone]- Electrical stimulation (manual), 20560 (1-2 muscles), 20561 (3+ muscles)- Dry Needling, Cryotherapy, and Moist heat  PLAN FOR NEXT SESSION: assess HEP response, global strengthening, improving endurance and functional activity tolerance  Kristoffer Leamon PT, DPT, LAT, ATC  07/10/24  3:17 PM        "

## 2024-07-12 NOTE — Progress Notes (Incomplete)
 Cardiology Office Note:   Date:  07/12/2024  ID:  MURLE OTTING, DOB Nov 04, 1945, MRN [id number] PCP: Merna Huxley, NP  [facility] Providers Cardiologist:  None { Chief Complaint: No chief complaint on file.     History of Present Illness:   Brandon Dixon is a 78 y.o. male with a PMH of HTN, HLD, PAD, aortic calcifications, PAF, DM2, BPH, PMR who presents as a new patient referral by Dr Elvan Sor for the evaluation of atrial fibrillation.  Mr. Bracken was recently hospitalized from 7/2-7/15 for the treatment of sepsis secondary to CAP and acute hypoxic respiratory failure.  He underwent an extensive evaluation for a causal pathogen including CT imaging and bronchoscopy without a specific pathogen being revealed.  During the hospitalization in the setting of his PNA, the patient developed new onset atrial fibrillation with RVR.  He converted spontaneously to NSR.  He was started on metoprolol  for rate control.  His echocardiogram showed preserved LV function.  He is referred for management.   Past Medical History:  Diagnosis Date   ALLERGIC RHINITIS 09/26/2007   BENIGN PROSTATIC HYPERTROPHY 05/27/2007   BPH with urinary obstruction    COLONIC POLYPS, HX OF 05/27/2007   DERMATITIS 08/25/2010   GERD 05/27/2007   HYPERCHOLESTEROLEMIA 09/26/2007   Peripheral vascular disease      Studies Reviewed:    EKG: ***       Cardiac Studies & Procedures   ______________________________________________________________________________________________     ECHOCARDIOGRAM  ECHOCARDIOGRAM COMPLETE 04/20/2024  Narrative ECHOCARDIOGRAM REPORT    Patient Name:   SHEDDRICK LATTANZIO Date of Exam: 04/20/2024 Medical Rec #:  [id number]      Height:       72.0 in Accession #:    [id number]     Weight:       171.7 lb Date of Birth:  1945/11/23      BSA:          1.997 m Patient Age:    78 years       BP:           131/72 mmHg Patient Gender: M              HR:           105 bpm. Exam  Location:  Inpatient  Procedure: 2D Echo, Cardiac Doppler, Color Doppler and Intracardiac Opacification Agent (Both Spectral and Color Flow Doppler were utilized during procedure).  Indications:    Atrial Fibrillation  History:        Patient has no prior history of Echocardiogram examinations. Risk Factors:Hypertension and Dyslipidemia.  Sonographer:    Therisa Crouch Referring Phys: [id number] JONATHAN SEGARS  IMPRESSIONS   1. Left ventricular ejection fraction, by estimation, is 55 to 60%. The left ventricle has normal function. The left ventricle has no regional wall motion abnormalities. Left ventricular diastolic parameters were normal. 2. Right ventricular systolic function is normal. The right ventricular size is normal. There is moderately elevated pulmonary artery systolic pressure. 3. Left atrial size was mildly dilated. 4. The mitral valve is grossly normal. Trivial mitral valve regurgitation. No evidence of mitral stenosis. 5. The aortic valve is grossly normal. Aortic valve regurgitation is not visualized. No aortic stenosis is present. 6. The inferior vena cava is normal in size with greater than 50% respiratory variability, suggesting right atrial pressure of 3 mmHg.  Comparison(s): No prior Echocardiogram.  Conclusion(s)/Recommendation(s): Normal biventricular function without evidence of hemodynamically significant valvular heart disease.  FINDINGS Left  Ventricle: Left ventricular ejection fraction, by estimation, is 55 to 60%. The left ventricle has normal function. The left ventricle has no regional wall motion abnormalities. Definity  contrast agent was given IV to delineate the left ventricular endocardial borders. The left ventricular internal cavity size was normal in size. There is no left ventricular hypertrophy. Left ventricular diastolic parameters were normal.  Right Ventricle: The right ventricular size is normal. No increase in right ventricular wall  thickness. Right ventricular systolic function is normal. There is moderately elevated pulmonary artery systolic pressure. The tricuspid regurgitant velocity is 3.54 m/s, and with an assumed right atrial pressure of 3 mmHg, the estimated right ventricular systolic pressure is 53.1 mmHg.  Left Atrium: Left atrial size was mildly dilated.  Right Atrium: Right atrial size was normal in size.  Pericardium: There is no evidence of pericardial effusion.  Mitral Valve: The mitral valve is grossly normal. Trivial mitral valve regurgitation. No evidence of mitral valve stenosis.  Tricuspid Valve: The tricuspid valve is normal in structure. Tricuspid valve regurgitation is trivial. No evidence of tricuspid stenosis.  Aortic Valve: The aortic valve is grossly normal. Aortic valve regurgitation is not visualized. No aortic stenosis is present.  Pulmonic Valve: The pulmonic valve was not well visualized. Pulmonic valve regurgitation is not visualized. No evidence of pulmonic stenosis.  Aorta: The aortic root, ascending aorta, aortic arch and descending aorta are all structurally normal, with no evidence of dilitation or obstruction and the aortic root and ascending aorta are structurally normal, with no evidence of dilitation.  Venous: The inferior vena cava is normal in size with greater than 50% respiratory variability, suggesting right atrial pressure of 3 mmHg.  IAS/Shunts: The atrial septum is grossly normal.   LEFT VENTRICLE PLAX 2D LVIDd:         4.10 cm Diastology LVIDs:         2.90 cm LV e' medial:    8.49 cm/s LV PW:         0.80 cm LV E/e' medial:  9.6 LV IVS:        0.90 cm LV e' lateral:   15.10 cm/s LV E/e' lateral: 5.4   RIGHT VENTRICLE             IVC RV Basal diam:  3.90 cm     IVC diam: 1.70 cm RV S prime:     15.30 cm/s TAPSE (M-mode): 2.3 cm  LEFT ATRIUM             Index        RIGHT ATRIUM           Index LA diam:        3.10 cm 1.55 cm/m   RA Area:     17.20 cm LA  Vol (A2C):   50.9 ml 25.49 ml/m  RA Volume:   49.50 ml  24.79 ml/m LA Vol (A4C):   62.2 ml 31.15 ml/m LA Biplane Vol: 59.9 ml 30.00 ml/m  AORTA Ao Root diam: 3.40 cm Ao Asc diam:  2.80 cm  MITRAL VALVE               TRICUSPID VALVE MV Area (PHT): 4.86 cm    TR Peak grad:   50.1 mmHg MV Decel Time: 156 msec    TR Vmax:        354.00 cm/s MV E velocity: 81.40 cm/s MV A velocity: 85.30 cm/s MV E/A ratio:  0.95  Shelda Bruckner MD Electronically signed by Shelda Bruckner  MD Signature Date/Time: 04/20/2024/4:10:29 PM    Final          ______________________________________________________________________________________________      Risk Assessment/Calculations:   {Does this patient have ATRIAL FIBRILLATION?:[phone]} No BP recorded.  {Refresh Note OR Click here to enter BP  :1}***        Physical Exam:     VS:  There were no vitals taken for this visit. ***    Wt Readings from Last 3 Encounters:  06/14/24 162 lb (73.5 kg)  05/18/24 163 lb (73.9 kg)  05/09/24 156 lb 3.2 oz (70.9 kg)     GEN: Well nourished, well developed, in no acute distress NECK: No JVD; No carotid bruits CARDIAC: ***RRR, no murmurs, rubs, gallops RESPIRATORY:  Clear to auscultation without rales, wheezing or rhonchi  ABDOMEN: Soft, non-tender, non-distended, normal bowel sounds EXTREMITIES:  Warm and well perfused, no edema; No deformity, 2+ radial pulses PSYCH: Normal mood and affect   Assessment & Plan       {Are you ordering a CV Procedure (e.g. stress test, cath, DCCV, TEE, etc)?   Press F2        :[id number]}   This note was written with the assistance of a dictation microphone or AI dictation software. Please excuse any typos or grammatical errors.   Signed, Georganna Archer, MD 07/12/2024 10:44 AM    [facility]

## 2024-07-13 ENCOUNTER — Other Ambulatory Visit (HOSPITAL_COMMUNITY): Payer: Self-pay

## 2024-07-13 ENCOUNTER — Ambulatory Visit
Attending: Student in an Organized Health Care Education/Training Program | Admitting: Student in an Organized Health Care Education/Training Program

## 2024-07-13 ENCOUNTER — Other Ambulatory Visit: Payer: Self-pay | Admitting: Student in an Organized Health Care Education/Training Program

## 2024-07-13 ENCOUNTER — Telehealth: Payer: Self-pay | Admitting: Student in an Organized Health Care Education/Training Program

## 2024-07-13 ENCOUNTER — Other Ambulatory Visit: Payer: Self-pay

## 2024-07-13 ENCOUNTER — Encounter: Payer: Self-pay | Admitting: Student in an Organized Health Care Education/Training Program

## 2024-07-13 VITALS — BP 118/64 | HR 58 | Ht 72.0 in | Wt 172.0 lb

## 2024-07-13 DIAGNOSIS — E782 Mixed hyperlipidemia: Secondary | ICD-10-CM | POA: Diagnosis not present

## 2024-07-13 DIAGNOSIS — I10 Essential (primary) hypertension: Secondary | ICD-10-CM | POA: Insufficient documentation

## 2024-07-13 DIAGNOSIS — I739 Peripheral vascular disease, unspecified: Secondary | ICD-10-CM

## 2024-07-13 DIAGNOSIS — I480 Paroxysmal atrial fibrillation: Secondary | ICD-10-CM

## 2024-07-13 DIAGNOSIS — I4891 Unspecified atrial fibrillation: Secondary | ICD-10-CM | POA: Diagnosis not present

## 2024-07-13 MED ORDER — DABIGATRAN ETEXILATE MESYLATE 150 MG PO CAPS
150.0000 mg | ORAL_CAPSULE | Freq: Two times a day (BID) | ORAL | 3 refills | Status: DC
  Filled 2024-07-13: qty 60, 30d supply, fill #0
  Filled 2024-07-13: qty 180, 90d supply, fill #0

## 2024-07-13 MED ORDER — METOPROLOL SUCCINATE ER 50 MG PO TB24
50.0000 mg | ORAL_TABLET | Freq: Every day | ORAL | 3 refills | Status: AC
  Filled 2024-07-13 – 2024-07-14 (×2): qty 90, 90d supply, fill #0

## 2024-07-13 MED ORDER — METOPROLOL SUCCINATE ER 50 MG PO TB24
50.0000 mg | ORAL_TABLET | Freq: Every day | ORAL | 3 refills | Status: DC
  Filled 2024-07-13 (×2): qty 90, 90d supply, fill #0

## 2024-07-13 MED ORDER — DABIGATRAN ETEXILATE MESYLATE 150 MG PO CAPS
150.0000 mg | ORAL_CAPSULE | Freq: Two times a day (BID) | ORAL | 3 refills | Status: DC
  Filled 2024-07-13: qty 180, 90d supply, fill #0

## 2024-07-13 MED ORDER — OMEPRAZOLE 20 MG PO CPDR: 20.0000 mg | DELAYED_RELEASE_CAPSULE | Freq: Every day | ORAL | 1 refills | Status: AC

## 2024-07-13 MED ORDER — METOPROLOL SUCCINATE ER 50 MG PO TB24: 50.0000 mg | ORAL_TABLET | Freq: Every day | ORAL | 3 refills | Status: DC

## 2024-07-13 MED ORDER — APIXABAN 5 MG PO TABS
5.0000 mg | ORAL_TABLET | Freq: Two times a day (BID) | ORAL | 3 refills | Status: DC
  Filled 2024-07-13: qty 60, 30d supply, fill #0

## 2024-07-13 NOTE — Telephone Encounter (Signed)
 Patient said the [facility] was going to look into something to get him a reduced rate on Pradaxa  -- which is generic. $257/90 days at our [facility]. He will fill med as prescribed.   ?drug deductible   Advised will send to [employer] and med assist team   Rx(s) sent to our pharmacy

## 2024-07-13 NOTE — Assessment & Plan Note (Signed)
 Goal LDL <70 given his diabetes and PAD.  Will check today. -Lipid panel - Continue atorvastatin  80 mg daily

## 2024-07-13 NOTE — Patient Instructions (Addendum)
 Medication Instructions:  START Pradaxa  150 mg twice daily  START Metoprolol  Succinate 50 mg daily   STOP Metoprolol  Tartrate STOP Aspirin    *If you need a refill on your cardiac medications before your next appointment, please call your pharmacy*  Lab Work: HEPATIC FUNCTION PANEL  LIPID PANEL   If you have labs (blood work) drawn today and your tests are completely normal, you will receive your results only by: MyChart Message (if you have MyChart) OR A paper copy in the mail If you have any lab test that is abnormal or we need to change your treatment, we will call you to review the results.   Follow-Up: At [facility], you and your health needs are our priority.  As part of our continuing mission to provide you with exceptional heart care, our providers are all part of one team.  This team includes your primary Cardiologist (physician) and Advanced Practice Providers or APPs (Physician Assistants and Nurse Practitioners) who all work together to provide you with the care you need, when you need it.  Your next appointment:   12 month(s)  Provider:   Georganna Archer, MD

## 2024-07-13 NOTE — Assessment & Plan Note (Signed)
 Blood pressure is well-controlled.  Continue olmesartan .

## 2024-07-13 NOTE — Telephone Encounter (Signed)
 *  STAT* If patient is at the pharmacy, call can be transferred to refill team.   1. Which medications need to be refilled? (please list name of each medication and dose if known)   dabigatran  (PRADAXA ) 150 MG CAPS capsule    metoprolol  succinate (TOPROL -XL) 50 MG 24 hr tablet    2. Would you like to learn more about the convenience, safety, & potential cost savings by using the [facility]? No      3. Are you open to using the [facility] (Type [facility]. ).no    4. Which pharmacy/location (including street and city if local pharmacy) is medication to be sent to?[facility] - [place] ([place]), [place] - [address]    5. Do they need a 30 day or 90 day supply? 90 days    [facility] closer to his home

## 2024-07-13 NOTE — Telephone Encounter (Signed)
 Hey, Is there anything we can do to help this patient?

## 2024-07-13 NOTE — Telephone Encounter (Signed)
 Pt's medication was sent to pt's pharmacy as requested. Confirmation received.

## 2024-07-13 NOTE — Assessment & Plan Note (Addendum)
 Patient with newly diagnosed PAF in the setting of pneumonia.  His CHA2DS2-VASc score = 5 which puts him at high risk for CVA.  I strongly recommend that the patient start a DOAC for stroke prevention.  I explained extensively to the patient the risk and benefits of starting vs not starting anticoagulation.  He is agreeable to starting Eliquis  at this time, but unfortunately I checked and is cost prohibitive.  Xarelto is also cost prohibitive.  Pradaxa  is the most cost effective, so I will prescribe Pradaxa  at this time.  I will check hepatic function panel as well to rule out significant hepatic disease I will also consolidate his metoprolol  to succinate. -Start dabigatran  150 mg twice daily -Start metoprolol  succinate 50 mg daily -Stop metoprolol  to tartrate -Hepatic function panel -Follow-up in 12 months

## 2024-07-13 NOTE — Addendum Note (Signed)
 Addended by: MANDA BOTTCHER B on: 07/13/2024 11:09 AM   Modules accepted: Orders

## 2024-07-13 NOTE — Assessment & Plan Note (Addendum)
 Currently taking a baby aspirin  daily for PAD.  I would discontinue this and lieu of starting Pradaxa . -Stop daily baby aspirin 

## 2024-07-13 NOTE — Telephone Encounter (Signed)
  Patient would like to speak with [facility] regarding the new prescription he was given today

## 2024-07-14 ENCOUNTER — Telehealth: Payer: Self-pay | Admitting: Pharmacy Technician

## 2024-07-14 ENCOUNTER — Ambulatory Visit: Payer: Self-pay | Admitting: Student in an Organized Health Care Education/Training Program

## 2024-07-14 ENCOUNTER — Other Ambulatory Visit (HOSPITAL_COMMUNITY): Payer: Self-pay

## 2024-07-14 ENCOUNTER — Telehealth: Payer: Self-pay | Admitting: Student in an Organized Health Care Education/Training Program

## 2024-07-14 DIAGNOSIS — I4891 Unspecified atrial fibrillation: Secondary | ICD-10-CM

## 2024-07-14 LAB — HEPATIC FUNCTION PANEL
ALT: 27 IU/L (ref 0–44)
AST: 34 IU/L (ref 0–40)
Albumin: 4.2 g/dL (ref 3.8–4.8)
Alkaline Phosphatase: 92 IU/L (ref 47–123)
Bilirubin Total: 0.4 mg/dL (ref 0.0–1.2)
Bilirubin, Direct: 0.12 mg/dL (ref 0.00–0.40)
Total Protein: 7.5 g/dL (ref 6.0–8.5)

## 2024-07-14 LAB — LIPID PANEL
Chol/HDL Ratio: 2.6 ratio (ref 0.0–5.0)
Cholesterol, Total: 177 mg/dL (ref 100–199)
HDL: 68 mg/dL (ref >39)
LDL Chol Calc (NIH): 87 mg/dL (ref 0–99)
Triglycerides: 125 mg/dL (ref 0–149)
VLDL Cholesterol Cal: 22 mg/dL (ref 5–40)

## 2024-07-14 MED ORDER — APIXABAN 5 MG PO TABS
5.0000 mg | ORAL_TABLET | Freq: Two times a day (BID) | ORAL | 3 refills | Status: DC
  Filled 2024-07-14 (×2): qty 180, 90d supply, fill #0

## 2024-07-14 MED ORDER — APIXABAN 5 MG PO TABS
5.0000 mg | ORAL_TABLET | Freq: Two times a day (BID) | ORAL | 3 refills | Status: AC
Start: 2024-07-14 — End: ?

## 2024-07-14 MED ORDER — EZETIMIBE 10 MG PO TABS
10.0000 mg | ORAL_TABLET | Freq: Every day | ORAL | 3 refills | Status: DC
  Filled 2024-07-14: qty 90, 90d supply, fill #0

## 2024-07-14 NOTE — Telephone Encounter (Signed)
 Spoke to Dr. Floretta and prescription has been changed to Eliquis  5 mg twice daily. Pt contacted and advised. Pt has not picked up prescription for Pradaxa  yet. Advised to not start Pradaxa . Pt agrees with change in plan of care.

## 2024-07-14 NOTE — Telephone Encounter (Signed)
*  STAT* If patient is at the pharmacy, call can be transferred to refill team.   1. Which medications need to be refilled? (please list name of each medication and dose if known) apixaban  (ELIQUIS ) 5 MG TABS tablet    2. Would you like to learn more about the convenience, safety, & potential cost savings by using the [facility]? No   3. Are you open to using the [facility] ([facility]) No   4. Which pharmacy/location (including street and city if local pharmacy) is medication to be sent to?  [facility] - [place] ([place]), [place] - [address]   5. Do they need a 30 day or 90 day supply? 90 day   Previous request sent to wrong pharmacy

## 2024-07-14 NOTE — Telephone Encounter (Signed)
 Eliquis  5mg  refill request received. Patient is 78 years old, weight-78kg, Crea-0.86 on 05/09/24, Diagnosis-Afib, and last seen by Dr. Floretta on 07/15/24. Dose is appropriate based on dosing criteria. Will send in refill to requested pharmacy.    Please note previous refill sent to [facility] by another user and [facility].

## 2024-07-14 NOTE — Telephone Encounter (Signed)
 Patient Advocate Encounter   The patient was approved for a [employer] grant that will help cover the cost of dabigatran  Total amount awarded, 7500.  Effective: 06/14/24 - 06/13/25   APW:[id number] ERW:[id number] Hmnle:[id number] PI:[id number] [employer] ID: [id number]   Pharmacy provided with approval and processing information. Patient informed via mychart

## 2024-07-14 NOTE — Telephone Encounter (Signed)
 Actually pradaxa  is no longer on the grant. Can this be changed to eliquis  or xarelto that is on the grant?

## 2024-07-17 ENCOUNTER — Ambulatory Visit: Admitting: Physical Therapy

## 2024-07-26 ENCOUNTER — Ambulatory Visit: Admitting: Physical Therapy

## 2024-08-15 LAB — PNEUMOCYSTIS JIROVECI SMEAR BY DFA

## 2024-08-16 ENCOUNTER — Other Ambulatory Visit: Payer: Self-pay | Admitting: Adult Health

## 2024-08-21 ENCOUNTER — Encounter: Payer: Self-pay | Admitting: Radiology

## 2024-09-18 ENCOUNTER — Telehealth: Payer: Self-pay | Admitting: Adult Health

## 2024-09-18 NOTE — Telephone Encounter (Signed)
 Pt dropped off form to be signed by Dr. Georgie in folder

## 2024-09-19 ENCOUNTER — Telehealth: Payer: Self-pay

## 2024-09-19 NOTE — Telephone Encounter (Unsigned)
 Copied from CRM #[id number]. Topic: General - Other >> Sep 19, 2024 10:14 AM Brandon Dixon wrote: Reason for CRM: Patient is calling regarding a form he dropped off yesterday; however, patient decided to stay with his current insurance and does not need that form to be filled out any longer.

## 2024-09-19 NOTE — Telephone Encounter (Signed)
 Noted

## 2024-09-19 NOTE — Telephone Encounter (Signed)
 Copied from CRM [id number]. Topic: Clinical - Medical Advice >> Sep 19, 2024 10:03 AM Charolett L wrote: Reason for CRM: Patient stated that the [employer] form that was dropped off is no longer needed and to cancel. Contacted CAL and adv     Noted!

## 2024-09-26 ENCOUNTER — Other Ambulatory Visit (HOSPITAL_COMMUNITY): Payer: Self-pay

## 2024-09-26 ENCOUNTER — Other Ambulatory Visit: Payer: Self-pay | Admitting: Student in an Organized Health Care Education/Training Program

## 2024-09-27 ENCOUNTER — Other Ambulatory Visit: Payer: Self-pay

## 2024-09-27 ENCOUNTER — Other Ambulatory Visit (HOSPITAL_COMMUNITY): Payer: Self-pay

## 2024-09-27 ENCOUNTER — Other Ambulatory Visit: Payer: Self-pay | Admitting: Student in an Organized Health Care Education/Training Program

## 2024-09-27 MED ORDER — APIXABAN 5 MG PO TABS
5.0000 mg | ORAL_TABLET | Freq: Two times a day (BID) | ORAL | 3 refills | Status: AC
  Filled 2024-09-27: qty 180, 90d supply, fill #0

## 2024-09-27 MED ORDER — EZETIMIBE 10 MG PO TABS
10.0000 mg | ORAL_TABLET | Freq: Every day | ORAL | 3 refills | Status: AC
  Filled 2024-09-27 – 2024-09-28 (×2): qty 90, 90d supply, fill #0

## 2024-09-28 ENCOUNTER — Other Ambulatory Visit (HOSPITAL_COMMUNITY): Payer: Self-pay

## 2024-10-05 ENCOUNTER — Ambulatory Visit: Admitting: Family Medicine

## 2024-10-05 DIAGNOSIS — Z0000 Encounter for general adult medical examination without abnormal findings: Secondary | ICD-10-CM | POA: Diagnosis not present

## 2024-10-05 NOTE — Patient Instructions (Signed)
 I really enjoyed getting to talk with you today! I am available on Tuesdays and Thursdays for virtual visits if you have any questions or concerns, or if I can be of any further assistance.   CHECKLIST FROM ANNUAL WELLNESS VISIT:  -Follow up (please call to schedule if not scheduled after visit):   -yearly for annual wellness visit with primary care office  Here is a list of your preventive care/health maintenance measures and the plan for each if any are due:  PLAN For any measures below that may be due:    1. Please bring copy of vaccine report form the pharmacy and we will update your medical record. Thank you.   Health Maintenance  Topic Date Due   Influenza Vaccine  05/19/2024   COVID-19 Vaccine (10 - 2025-26 season) 06/19/2024   Medicare Annual Wellness (AWV)  08/12/2024   DTaP/Tdap/Td (3 - Td or Tdap) 02/24/2033   Pneumococcal Vaccine: 50+ Years  Completed   Hepatitis C Screening  Completed   Zoster Vaccines- Shingrix  Completed   Meningococcal B Vaccine  Aged Out   Colonoscopy  Discontinued    -See a dentist at least yearly  -Get your eyes checked and then per your eye specialist's recommendations  -Other issues addressed today:   -I have included below further information regarding a healthy whole foods based diet, physical activity guidelines for adults, stress management and opportunities for social connections. I hope you find this information useful.   -----------------------------------------------------------------------------------------------------------------------------------------------------------------------------------------------------------------------------------------------------------    NUTRITION: -eat real food: lots of colorful vegetables (half the plate) and fruits -5-7 servings of vegetables and fruits per day (fresh or steamed is best), exp. 2 servings of vegetables with lunch and dinner and 2 servings of fruit per day. Berries and greens  such as kale and collards are great choices.  -consume on a regular basis:  fresh fruits, fresh veggies, fish, nuts, seeds, healthy oils (such as olive oil, avocado oil), whole grains (make sure for bread/pasta/crackers/etc., that the first ingredient on label contains the word whole), legumes. -can eat small amounts of dairy and lean meat (no larger than the palm of your hand), but avoid processed meats such as ham, bacon, lunch meat, etc. -drink water -try to avoid fast food and pre-packaged foods, processed meat, ultra processed foods/beverages (donuts, candy, etc.) -most experts advise limiting sodium to < 2300mg  per day, should limit further is any chronic conditions such as high blood pressure, heart disease, diabetes, etc. The American Heart Association advised that < 1500mg  is is ideal -try to avoid foods/beverages that contain any ingredients with names you do not recognize  -try to avoid foods/beverages  with added sugar or sweeteners/sweets  -try to avoid sweet drinks (including diet drinks): soda, juice, Gatorade, sweet tea, power drinks, diet drinks -try to avoid white rice, white bread, pasta (unless whole grain)  EXERCISE GUIDELINES FOR ADULTS: -if you wish to increase your physical activity, do so gradually and with the approval of your doctor -STOP and seek medical care immediately if you have any chest pain, chest discomfort or trouble breathing when starting or increasing exercise  -move and stretch your body, legs, feet and arms when sitting for long periods -Physical activity guidelines for optimal health in adults: -get at least 150 minutes per week of moderate exercise (can talk, but not sing); this is about 20-30 minutes of sustained activity 5-7 days per week or two 10-15 minute episodes of sustained activity 5-7 days per week -do some muscle building/resistance training/strength  training at least 2 days per week  -balance exercises 3+ days per week:   Stand somewhere  where you have something sturdy to hold onto if you lose balance    1) lift up on toes, then back down, start with 5x per day and work up to 20x   2) stand and lift one leg straight out to the side so that foot is a few inches of the floor, start with 5x each side and work up to 20x each side   3) stand on one foot, start with 5 seconds each side and work up to 20 seconds on each side  If you need ideas or help with getting more active:  -Silver  sneakers https://tools.silversneakers.com  -Walk with a Doc: Http://www.duncan-williams.com/  -try to include resistance (weight lifting/strength building) and balance exercises twice per week: or the following link for ideas: http://castillo-powell.com/  buyducts.dk  STRESS MANAGEMENT: -can try meditating, or just sitting quietly with deep breathing while intentionally relaxing all parts of your body for 5 minutes daily -if you need further help with stress, anxiety or depression please follow up with your primary doctor or contact the wonderful folks at [facility]: [phone]  SOCIAL CONNECTIONS: -options in [place] if you wish to engage in more social and exercise related activities:  -Silver  sneakers https://tools.silversneakers.com  -Walk with a Doc: Http://www.duncan-williams.com/  -Check out the [facility] Active Adults 50+ section on the [place] [employer] companies (hiking clubs, book clubs, cards and games, chess, exercise classes, aquatic classes and much more) - see the website for details: https://www.[place].gov/departments/parks-recreation/active-adults50  -YouTube has lots of exercise videos for different ages and abilities as well  -[facility] (a variety of indoor and outdoor inperson activities for adults). [phone]. [address]  -Virtual Online Classes (a variety of topics): see  seniorplanet.org or call (678)532-8535  -consider volunteering at a school, hospice center, church, senior center or elsewhere    ADVANCED HEALTHCARE DIRECTIVES:  [place] Advanced Directives assistance:   expressweek.com.cy  Everyone should have advanced health care directives in place. This is so that you get the care you want, should you ever be in a situation where you are unable to make your own medical decisions.   From the  Advanced Directive Website: Advance Health Care Directives are legal documents in which you give written instructions about your health care if, in the future, you cannot speak for yourself.   A health care power of attorney allows you to name a person you trust to make your health care decisions if you cannot make them yourself. A declaration of a desire for a natural death (or living will) is document, which states that you desire not to have your life prolonged by extraordinary measures if you have a terminal or incurable illness or if you are in a vegetative state. An advance instruction for mental health treatment makes a declaration of instructions, information and preferences regarding your mental health treatment. It also states that you are aware that the advance instruction authorizes a mental health treatment provider to act according to your wishes. It may also outline your consent or refusal of mental health treatment. A declaration of an anatomical gift allows anyone over the age of 6 to make a gift by will, organ donor card or other document.   Please see the following website or an elder law attorney for forms, FAQs and for completion of advanced directives: [place]  [employer]anner Health Care Directives Advance Health Care  Directives (http://guzman.com/)  Or copy and paste the following to your web browser: Poshchat.fi

## 2024-10-05 NOTE — Progress Notes (Signed)
 ----------------------------------------------------------------------------------------------------------------------------------------------------------------------------------------------------------------------  Because this visit was a virtual/telehealth visit, some criteria may be missing or patient reported. Any vitals not documented were not able to be obtained and vitals that have been documented are patient reported.    MEDICARE ANNUAL PREVENTIVE CARE VISIT WITH PROVIDER (Welcome to Medicare, initial annual wellness or annual wellness exam)  Virtual Visit via Video Phone Note  I connected with Brandon Dixon on 10/05/2024  by phone/video enabled telemedicine application and verified that I am speaking with the correct person using two identifiers. I was unable to see him - but he could see me on the video.   Location patient: home Location provider:work or home office Persons participating in the virtual visit: patient, provider  Concerns and/or follow up today: detailed intake and health/risks assessment completed on flow sheets and below- please see for details. Reports has been feeling pretty good - reports has fully recovered from the pna he had in July. Had a blood pressure 118/69 a few days ago.   How often do you have a drink containing alcohol?a few times per week How many drinks containing alcohol do you have on a typical day when you are drinking? 1 How often do you have six or more drinks on one occasion? never Have you ever smoked?y Quit date if applicable? 2003  How many packs a day do/did you smoke? 1/2 ppd for 40 years Do you use smokeless tobacco?n Do you use an illicit drugs?n Do you feel safe at home?y Last dentist visit? goes 4 x per year Last eye Exam and location?1 year ago, Dr. Nichole   See HM section in Epic for other details of completed HM.    ROS: negative for report of fevers, unintentional weight loss, vision changes, vision loss, hearing loss or  change, chest pain, sob, hemoptysis, melena, hematochezia, hematuria,bleeding or bruising  Patient-completed extensive health risk assessment - reviewed and discussed with the patient: See Health Risk Assessment completed with patient prior to the visit either above or in recent phone note. This was reviewed in detailed with the patient today and appropriate recommendations, orders and referrals were placed as needed per Summary below and patient instructions.   Review of Medical History: -PMH, PSH, Family History and current specialty and care providers reviewed and updated and listed below   Patient Care Team: Merna Huxley, NP as PCP - General (Family Medicine) Floretta Mallard, MD as PCP - Cardiology (Cardiology) Kristie Lamprey, MD as Consulting Physician (Gastroenterology) Liane Sharyne MATSU, [facility] (Inactive) as Pharmacist (Pharmacist) Glendia Simmonds, OD as Referring Physician (Optometry)   Past Medical History:  Diagnosis Date   ALLERGIC RHINITIS 09/26/2007   BENIGN PROSTATIC HYPERTROPHY 05/27/2007   BPH with urinary obstruction    COLONIC POLYPS, HX OF 05/27/2007   DERMATITIS 08/25/2010   GERD 05/27/2007   HYPERCHOLESTEROLEMIA 09/26/2007   Peripheral vascular disease     Past Surgical History:  Procedure Laterality Date   CYSTOSCOPY  11/10/2022   per [facility], Holmium laser enucleation with morcellation   LOWER EXTREMITY ANGIOGRAM Left 04/30/2014   Procedure: LOWER EXTREMITY ANGIOGRAM;  Surgeon: Lonni GORMAN Blade, MD;  Location: [facility] CATH LAB;  Service: Cardiovascular;  Laterality: Left;   ROTATOR CUFF REPAIR     VASECTOMY     VIDEO BRONCHOSCOPY Bilateral 04/25/2024   Procedure: VIDEO BRONCHOSCOPY WITHOUT FLUORO;  Surgeon: Jude Harden GAILS, MD;  Location: [facility] ENDOSCOPY;  Service: Cardiopulmonary;  Laterality: Bilateral;    Social History   Socioeconomic History   Marital status: Married  Spouse name: Not on file   Number of children: Not on file   Years of education:  Not on file   Highest education level: Some college, no degree  Occupational History   Not on file  Tobacco Use   Smoking status: Former    Current packs/day: 0.00    Types: Cigarettes    Quit date: 10/19/2001    Years since quitting: 22.9   Smokeless tobacco: Never  Vaping Use   Vaping status: Never Used  Substance and Sexual Activity   Alcohol use: Yes    Alcohol/week: 6.0 standard drinks of alcohol    Types: 6 Cans of beer per week   Drug use: No   Sexual activity: Not on file  Other Topics Concern   Not on file  Social History Narrative   Works as a patent attorney    Married    Three daughters    Social Drivers of Health   Tobacco Use: Medium Risk (07/13/2024)   Patient History    Smoking Tobacco Use: Former    Smokeless Tobacco Use: Never    Passive Exposure: Not on Actuary Strain: Low Risk (08/13/2023)   Overall Financial Resource Strain (CARDIA)    Difficulty of Paying Living Expenses: Not hard at all  Food Insecurity: No Food Insecurity (05/01/2024)   Epic    Worried About Programme Researcher, Broadcasting/film/video in the Last Year: Never true    Ran Out of Food in the Last Year: Never true  Transportation Needs: No Transportation Needs (05/01/2024)   Epic    Lack of Transportation (Medical): No    Lack of Transportation (Non-Medical): No  Physical Activity: Unknown (10/05/2024)   Exercise Vital Sign    Days of Exercise per Week: 7 days    Minutes of Exercise per Session: Not on file  Stress: No Stress Concern Present (08/13/2023)   [employer] of Occupational Health - Occupational Stress Questionnaire    Feeling of Stress : Not at all  Social Connections: Moderately Isolated (10/05/2024)   Social Connection and Isolation Panel    Frequency of Communication with Friends and Family: Once a week    Frequency of Social Gatherings with Friends and Family: Twice a week    Attends Religious Services: Never    Database Administrator or Organizations: No    Attends  Banker Meetings: Never    Marital Status: Married  Catering Manager Violence: Not At Risk (05/01/2024)   Epic    Fear of Current or Ex-Partner: No    Emotionally Abused: No    Physically Abused: No    Sexually Abused: No  Depression (PHQ2-9): Low Risk (10/05/2024)   Depression (PHQ2-9)    PHQ-2 Score: 0  Alcohol Screen: Low Risk (08/13/2023)   Alcohol Screen    Last Alcohol Screening Score (AUDIT): 4  Housing: Unknown (05/01/2024)   Epic    Unable to Pay for Housing in the Last Year: No    Number of Times Moved in the Last Year: Not on file    Homeless in the Last Year: No  Utilities: Not At Risk (05/01/2024)   Epic    Threatened with loss of utilities: No  Health Literacy: Adequate Health Literacy (08/13/2023)   B1300 Health Literacy    Frequency of need for help with medical instructions: Never    Family History  Problem Relation Age of Onset   Heart disease Mother    Heart attack Mother  69    Cancer Father        lung ca   Hypertension Sister    Diabetes Brother    Hypertension Sister    Diabetes Brother    Diabetes Brother    Diabetes Brother    Diabetes Brother    Diabetes Daughter     Medications Ordered Prior to Encounter[1]  Allergies[2]     Physical Exam Vitals requested from patient and listed below if patient had equipment and was able to obtain at home for this virtual visit: There were no vitals filed for this visit. Estimated body mass index is 23.33 kg/m as calculated from the following:   Height as of 07/13/24: 6' (1.829 m).   Weight as of 07/13/24: 172 lb (78 kg).  EKG (optional): deferred due to virtual visit  GENERAL: alert, oriented, no acute distress detected; full vision exam deferred due to pandemic and/or virtual encounter  PSYCH/NEURO: pleasant and cooperative, no obvious depression or anxiety, speech and thought processing grossly intact, Cognitive function grossly intact  Flowsheet Row Clinical Support from  06/26/2020 in [facility]  PHQ-9 Total Score 0        10/05/2024    4:50 PM 05/09/2024    3:12 PM 08/13/2023    1:28 PM 07/16/2022   12:45 PM 12/05/2021   10:04 AM  Depression screen PHQ 2/9  Decreased Interest 0 0 0 0 0  Down, Depressed, Hopeless 0 0 0 0 0  PHQ - 2 Score 0 0 0 0 0       07/16/2022   12:48 PM 08/13/2023    1:43 PM 05/09/2024    3:12 PM 05/18/2024    8:55 AM 10/05/2024    4:34 PM  Fall Risk  Falls in the past year? 0 0 0 0 0  Was there an injury with Fall? 0  0  0  0  0  Fall Risk Category Calculator 0 0 0 0 0  Fall Risk Category (Retired) Low       (RETIRED) Patient Fall Risk Level Low fall risk       Patient at Risk for Falls Due to No Fall Risks No Fall Risks No Fall Risks No Fall Risks No Fall Risks  Fall risk Follow up Falls prevention discussed  Falls prevention discussed Falls evaluation completed Falls evaluation completed Falls evaluation completed     Data saved with a previous flowsheet row definition     SUMMARY AND PLAN:  Encounter for Medicare annual wellness exam   Discussed applicable health maintenance/preventive health measures and advised and referred or ordered per patient preferences: -discussed vaccines due - he says he had the vaccines at the pharmacy - asked that he bring copy of immunization record to the office Health Maintenance  Topic Date Due   Influenza Vaccine  05/19/2024   COVID-19 Vaccine (10 - 2025-26 season) 06/19/2024   Medicare Annual Wellness (AWV)  10/05/2025   DTaP/Tdap/Td (3 - Td or Tdap) 02/24/2033   Pneumococcal Vaccine: 50+ Years  Completed   Hepatitis C Screening  Completed   Zoster Vaccines- Shingrix  Completed   Meningococcal B Vaccine  Aged Out   Colonoscopy  Discontinued     Education and counseling on the following was provided based on the above review of health and a plan/checklist for the patient, along with additional information discussed, was provided for the  patient in the patient instructions :  -Advised on importance of completing advanced directives, discussed  options for completing and provided information in patient instructions as well -Advised and counseled on a healthy lifestyle - including the importance of a healthy diet, regular physical activity, social connections and stress management. -Reviewed patient's current diet. Advised and counseled on a whole foods based healthy diet. A summary of a healthy diet was provided in the Patient Instructions.  -reviewed patient's current physical activity level and discussed exercise guidelines for adults. Congratulated on healthy habits - further resources provided in patient instructions.  -Advise yearly dental visits at minimum and regular eye exams  Follow up: see patient instructions   Patient Instructions  I really enjoyed getting to talk with you today! I am available on Tuesdays and Thursdays for virtual visits if you have any questions or concerns, or if I can be of any further assistance.   CHECKLIST FROM ANNUAL WELLNESS VISIT:  -Follow up (please call to schedule if not scheduled after visit):   -yearly for annual wellness visit with primary care office  Here is a list of your preventive care/health maintenance measures and the plan for each if any are due:  PLAN For any measures below that may be due:    1. Please bring copy of vaccine report form the pharmacy and we will update your medical record. Thank you.   Health Maintenance  Topic Date Due   Influenza Vaccine  05/19/2024   COVID-19 Vaccine (10 - 2025-26 season) 06/19/2024   Medicare Annual Wellness (AWV)  08/12/2024   DTaP/Tdap/Td (3 - Td or Tdap) 02/24/2033   Pneumococcal Vaccine: 50+ Years  Completed   Hepatitis C Screening  Completed   Zoster Vaccines- Shingrix  Completed   Meningococcal B Vaccine  Aged Out   Colonoscopy  Discontinued    -See a dentist at least yearly  -Get your eyes checked and then per your  eye specialist's recommendations  -Other issues addressed today:   -I have included below further information regarding a healthy whole foods based diet, physical activity guidelines for adults, stress management and opportunities for social connections. I hope you find this information useful.   -----------------------------------------------------------------------------------------------------------------------------------------------------------------------------------------------------------------------------------------------------------    NUTRITION: -eat real food: lots of colorful vegetables (half the plate) and fruits -5-7 servings of vegetables and fruits per day (fresh or steamed is best), exp. 2 servings of vegetables with lunch and dinner and 2 servings of fruit per day. Berries and greens such as kale and collards are great choices.  -consume on a regular basis:  fresh fruits, fresh veggies, fish, nuts, seeds, healthy oils (such as olive oil, avocado oil), whole grains (make sure for bread/pasta/crackers/etc., that the first ingredient on label contains the word whole), legumes. -can eat small amounts of dairy and lean meat (no larger than the palm of your hand), but avoid processed meats such as ham, bacon, lunch meat, etc. -drink water -try to avoid fast food and pre-packaged foods, processed meat, ultra processed foods/beverages (donuts, candy, etc.) -most experts advise limiting sodium to < 2300mg  per day, should limit further is any chronic conditions such as high blood pressure, heart disease, diabetes, etc. The American Heart Association advised that < 1500mg  is is ideal -try to avoid foods/beverages that contain any ingredients with names you do not recognize  -try to avoid foods/beverages  with added sugar or sweeteners/sweets  -try to avoid sweet drinks (including diet drinks): soda, juice, Gatorade, sweet tea, power drinks, diet drinks -try to avoid white rice, white  bread, pasta (unless whole grain)  EXERCISE GUIDELINES FOR ADULTS: -  if you wish to increase your physical activity, do so gradually and with the approval of your doctor -STOP and seek medical care immediately if you have any chest pain, chest discomfort or trouble breathing when starting or increasing exercise  -move and stretch your body, legs, feet and arms when sitting for long periods -Physical activity guidelines for optimal health in adults: -get at least 150 minutes per week of moderate exercise (can talk, but not sing); this is about 20-30 minutes of sustained activity 5-7 days per week or two 10-15 minute episodes of sustained activity 5-7 days per week -do some muscle building/resistance training/strength training at least 2 days per week  -balance exercises 3+ days per week:   Stand somewhere where you have something sturdy to hold onto if you lose balance    1) lift up on toes, then back down, start with 5x per day and work up to 20x   2) stand and lift one leg straight out to the side so that foot is a few inches of the floor, start with 5x each side and work up to 20x each side   3) stand on one foot, start with 5 seconds each side and work up to 20 seconds on each side  If you need ideas or help with getting more active:  -Silver  sneakers https://tools.silversneakers.com  -Walk with a Doc: Http://www.duncan-williams.com/  -try to include resistance (weight lifting/strength building) and balance exercises twice per week: or the following link for ideas: http://castillo-powell.com/  buyducts.dk  STRESS MANAGEMENT: -can try meditating, or just sitting quietly with deep breathing while intentionally relaxing all parts of your body for 5 minutes daily -if you need further help with stress, anxiety or depression please follow up with your primary doctor or contact the wonderful folks at [facility]: [phone]  SOCIAL CONNECTIONS: -options in [place] if you wish to engage in more social and exercise related activities:  -Silver  sneakers https://tools.silversneakers.com  -Walk with a Doc: Http://www.duncan-williams.com/  -Check out the [facility] Active Adults 50+ section on the [place] of [employer] companies (hiking clubs, book clubs, cards and games, chess, exercise classes, aquatic classes and much more) - see the website for details: https://www.[place].gov/departments/parks-recreation/active-adults50  -YouTube has lots of exercise videos for different ages and abilities as well  -[facility] (a variety of indoor and outdoor inperson activities for adults). [phone]. [address]  -Virtual Online Classes (a variety of topics): see seniorplanet.org or call [phone]  -consider volunteering at a school, hospice center, church, senior center or elsewhere    ADVANCED HEALTHCARE DIRECTIVES:  [place] Advanced Directives assistance:   expressweek.com.cy  Everyone should have advanced health care directives in place. This is so that you get the care you want, should you ever be in a situation where you are unable to make your own medical decisions.   From the [place] Advanced Directive Website: Advance Health Care Directives are legal documents in which you give written instructions about your health care if, in the future, you cannot speak for yourself.   A health care power of attorney allows you to name a person you trust to make your health care decisions if you cannot make them yourself. A declaration of a desire for a natural death (or living will) is document, which states that you desire not to have your life prolonged by extraordinary measures if you have a terminal or incurable illness or if you are in a vegetative state. An advance instruction for mental health treatment  makes a declaration of instructions, information and preferences regarding your mental health treatment. It also states that you are aware that the advance instruction authorizes a mental health treatment provider to act according to your wishes. It may also outline your consent or refusal of mental health treatment. A declaration of an anatomical gift allows anyone over the age of 69 to make a gift by will, organ donor card or other document.   Please see the following website or an elder law attorney for forms, FAQs and for completion of advanced directives: [place]  Print Production Planner Health Care Directives Advance Health Care Directives (http://guzman.com/)  Or copy and paste the following to your web browser: Poshchat.fi          Chiquita JONELLE Cramp, DO     [1]  Current Outpatient Medications on File Prior to Visit  Medication Sig Dispense Refill   apixaban  (ELIQUIS ) 5 MG TABS tablet Take 1 tablet (5 mg total) by mouth 2 (two) times daily. 180 tablet 3   atorvastatin  (LIPITOR) 80 MG tablet TAKE 1 TABLET EVERY EVENING 90 tablet 3   ezetimibe  (ZETIA ) 10 MG tablet Take 1 tablet (10 mg total) by mouth daily. 90 tablet 3   metFORMIN  (GLUCOPHAGE -XR) 500 MG 24 hr tablet TAKE 1 TABLET DAILY WITH BREAKFAST 90 tablet 3   metoprolol  succinate (TOPROL -XL) 50 MG 24 hr tablet Take 1 tablet (50 mg total) by mouth daily. Take with or immediately following a meal. 90 tablet 3   omeprazole  (PRILOSEC) 20 MG capsule Take 1 capsule (20 mg total) by mouth daily. 90 capsule 1   predniSONE  (DELTASONE ) 5 MG tablet Take 1 tablet (5 mg total) by mouth daily with breakfast.     apixaban  (ELIQUIS ) 5 MG TABS tablet Take 1 tablet (5 mg total) by mouth 2 (two) times daily. 180 tablet 3   AREXVY 120 MCG/0.5ML injection      hydrocortisone ointment 0.5 % Apply 1 application topically daily as needed for itching.     Multiple Vitamin (MULTI-VITAMIN) tablet Take 1 tablet by mouth daily.      olmesartan  (BENICAR ) 5 MG tablet TAKE 1 TABLET EVERY DAY 90 tablet 3   tadalafil  (CIALIS ) 20 MG tablet Take 0.5-1 tablets (10-20 mg total) by mouth every other day as needed for erectile dysfunction. 10 tablet 11   No current facility-administered medications on file prior to visit.  [2]  Allergies Allergen Reactions   Augmentin  [Amoxicillin -Pot Clavulanate] Diarrhea    Adverse reaction with diarrhea, decreased PO intake

## 2024-10-17 ENCOUNTER — Other Ambulatory Visit (HOSPITAL_COMMUNITY): Payer: Self-pay

## 2024-11-10 ENCOUNTER — Other Ambulatory Visit: Payer: Self-pay | Admitting: Adult Health

## 2024-11-10 DIAGNOSIS — I10 Essential (primary) hypertension: Secondary | ICD-10-CM

## 2024-11-21 ENCOUNTER — Ambulatory Visit: Payer: Self-pay

## 2024-11-22 ENCOUNTER — Ambulatory Visit: Admitting: Adult Health

## 2024-11-22 ENCOUNTER — Encounter: Payer: Self-pay | Admitting: Adult Health

## 2024-11-22 VITALS — BP 138/80 | HR 54 | Temp 98.3°F | Wt 182.0 lb

## 2024-11-22 DIAGNOSIS — J069 Acute upper respiratory infection, unspecified: Secondary | ICD-10-CM | POA: Diagnosis not present

## 2024-11-22 MED ORDER — METFORMIN HCL ER 500 MG PO TB24: 500.0000 mg | ORAL_TABLET | Freq: Every day | ORAL | 3 refills | Status: AC

## 2024-11-22 NOTE — Telephone Encounter (Signed)
 Copied from CRM [id number]. Topic: Clinical - Medication Refill >> Nov 22, 2024  2:04 PM Deleta S wrote: Medication: metFORMIN  (GLUCOPHAGE -XR) 500 MG 24 hr tablet   Has the patient contacted their pharmacy? Yes (Agent: If no, request that the patient contact the pharmacy for the refill. If patient does not wish to contact the pharmacy document the reason why and proceed with request.) (Agent: If yes, when and what did the pharmacy advise?)  This is the patient's preferred pharmacy:  [facility] - [place], [place] - [address] [place] [address] Phone: [phone] Fax: [phone]    Is this the correct pharmacy for this prescription? Yes If no, delete pharmacy and type the correct one.   Has the prescription been filled recently? Yes  Is the patient out of the medication? Not sure  Has the patient been seen for an appointment in the last year OR does the patient have an upcoming appointment? Yes  Can we respond through MyChart? No  Agent: Please be advised that Rx refills may take up to 3 business days. We ask that you follow-up with your pharmacy.

## 2024-11-22 NOTE — Telephone Encounter (Signed)
 This was already filled for 6 months. I will resend this.

## 2024-11-22 NOTE — Progress Notes (Signed)
 "  Subjective:    Patient ID: Brandon Dixon, male    DOB: 1945-10-29, 79 y.o.   MRN: [id number]  HPI Discussed the use of AI scribe software for clinical note transcription with the patient, who gave verbal consent to proceed.  History of Present Illness   Brandon Dixon is a 79 year old male who presents with cough and nasal congestion for three to four days.  He has had a mildly productive cough and nasal congestion for 3 to 4 days with clear nasal and throat mucus. He notes runny nose, nasal congestion, and an itchy, tingling sensation in his throat. He denies fever, chills, wheezing, shortness of breath, or ear pain. He has a mild headache with the congestion. He is using over-the-counter Robatussin DM with partial relief, and the cough is not disturbing his sleep.        Review of Systems See HPI   Past Medical History:  Diagnosis Date   ALLERGIC RHINITIS 09/26/2007   BENIGN PROSTATIC HYPERTROPHY 05/27/2007   BPH with urinary obstruction    COLONIC POLYPS, HX OF 05/27/2007   DERMATITIS 08/25/2010   GERD 05/27/2007   HYPERCHOLESTEROLEMIA 09/26/2007   Peripheral vascular disease     Social History   Socioeconomic History   Marital status: Married    Spouse name: Not on file   Number of children: Not on file   Years of education: Not on file   Highest education level: Some college, no degree  Occupational History   Not on file  Tobacco Use   Smoking status: Former    Current packs/day: 0.00    Types: Cigarettes    Quit date: 10/19/2001    Years since quitting: 23.1   Smokeless tobacco: Never  Vaping Use   Vaping status: Never Used  Substance and Sexual Activity   Alcohol use: Yes    Alcohol/week: 6.0 standard drinks of alcohol    Types: 6 Cans of beer per week   Drug use: No   Sexual activity: Not on file  Other Topics Concern   Not on file  Social History Narrative   Works as a patent attorney    Married    Three daughters    Social Drivers of Health    Tobacco Use: Medium Risk (11/22/2024)   Patient History    Smoking Tobacco Use: Former    Smokeless Tobacco Use: Never    Passive Exposure: Not on Actuary Strain: Low Risk (08/13/2023)   Overall Financial Resource Strain (CARDIA)    Difficulty of Paying Living Expenses: Not hard at all  Food Insecurity: No Food Insecurity (05/01/2024)   Epic    Worried About Programme Researcher, Broadcasting/film/video in the Last Year: Never true    Ran Out of Food in the Last Year: Never true  Transportation Needs: No Transportation Needs (05/01/2024)   Epic    Lack of Transportation (Medical): No    Lack of Transportation (Non-Medical): No  Physical Activity: Unknown (10/05/2024)   Exercise Vital Sign    Days of Exercise per Week: 7 days    Minutes of Exercise per Session: Not on file  Stress: No Stress Concern Present (10/05/2024)   [employer] of Occupational Health - Occupational Stress Questionnaire    Feeling of Stress: Not at all  Social Connections: Moderately Isolated (10/05/2024)   Social Connection and Isolation Panel    Frequency of Communication with Friends and Family: Once a week    Frequency of Social  Gatherings with Friends and Family: Twice a week    Attends Religious Services: Never    Database Administrator or Organizations: No    Attends Banker Meetings: Never    Marital Status: Married  Catering Manager Violence: Not At Risk (05/01/2024)   Epic    Fear of Current or Ex-Partner: No    Emotionally Abused: No    Physically Abused: No    Sexually Abused: No  Depression (PHQ2-9): Low Risk (10/05/2024)   Depression (PHQ2-9)    PHQ-2 Score: 0  Alcohol Screen: Low Risk (08/13/2023)   Alcohol Screen    Last Alcohol Screening Score (AUDIT): 4  Housing: Unknown (05/01/2024)   Epic    Unable to Pay for Housing in the Last Year: No    Number of Times Moved in the Last Year: Not on file    Homeless in the Last Year: No  Utilities: Not At Risk (05/01/2024)   Epic     Threatened with loss of utilities: No  Health Literacy: Adequate Health Literacy (08/13/2023)   B1300 Health Literacy    Frequency of need for help with medical instructions: Never    Past Surgical History:  Procedure Laterality Date   CYSTOSCOPY  11/10/2022   per [facility], Holmium laser enucleation with morcellation   LOWER EXTREMITY ANGIOGRAM Left 04/30/2014   Procedure: LOWER EXTREMITY ANGIOGRAM;  Surgeon: Lonni GORMAN Blade, MD;  Location: [facility] CATH LAB;  Service: Cardiovascular;  Laterality: Left;   ROTATOR CUFF REPAIR     VASECTOMY     VIDEO BRONCHOSCOPY Bilateral 04/25/2024   Procedure: VIDEO BRONCHOSCOPY WITHOUT FLUORO;  Surgeon: Jude Harden GAILS, MD;  Location: [facility] ENDOSCOPY;  Service: Cardiopulmonary;  Laterality: Bilateral;    Family History  Problem Relation Age of Onset   Heart disease Mother    Heart attack Mother        28    Cancer Father        lung ca   Hypertension Sister    Diabetes Brother    Hypertension Sister    Diabetes Brother    Diabetes Brother    Diabetes Brother    Diabetes Brother    Diabetes Daughter     Allergies[1]  Medications Ordered Prior to Encounter[2]  BP 138/80   Pulse (!) 54   Temp 98.3 F (36.8 C) (Oral)   Wt 182 lb (82.6 kg)   SpO2 98%   BMI 24.68 kg/m       Objective:   Physical Exam Vitals and nursing note reviewed.  Constitutional:      Appearance: Normal appearance.  HENT:     Nose: Congestion and rhinorrhea present. Rhinorrhea is clear.     Right Turbinates: Not enlarged or swollen.     Left Turbinates: Not enlarged or swollen.     Right Sinus: No maxillary sinus tenderness or frontal sinus tenderness.     Left Sinus: No maxillary sinus tenderness or frontal sinus tenderness.     Mouth/Throat:     Mouth: Mucous membranes are moist.     Pharynx: Postnasal drip present. No pharyngeal swelling, oropharyngeal exudate or posterior oropharyngeal erythema.  Cardiovascular:     Rate and Rhythm: Normal rate and  regular rhythm.     Pulses: Normal pulses.     Heart sounds: Normal heart sounds.  Pulmonary:     Effort: Pulmonary effort is normal.     Breath sounds: Normal breath sounds.  Musculoskeletal:  General: Normal range of motion.  Skin:    General: Skin is warm and dry.  Neurological:     General: No focal deficit present.     Mental Status: He is alert and oriented to person, place, and time.  Psychiatric:        Mood and Affect: Mood normal.        Behavior: Behavior normal.        Thought Content: Thought content normal.        Judgment: Judgment normal.           Assessment & Plan:  Assessment and Plan    Viral URI Viral etiology with expected improvement over the next week  - Continue OTC DM for cough relief. - Advised hydration and rest. - Report if symptoms persist beyond day 10 or if fever and chills develop.           [1]  Allergies Allergen Reactions   Augmentin  [Amoxicillin -Pot Clavulanate] Diarrhea    Adverse reaction with diarrhea, decreased PO intake   [2]  Current Outpatient Medications on File Prior to Visit  Medication Sig Dispense Refill   apixaban  (ELIQUIS ) 5 MG TABS tablet Take 1 tablet (5 mg total) by mouth 2 (two) times daily. 180 tablet 3   apixaban  (ELIQUIS ) 5 MG TABS tablet Take 1 tablet (5 mg total) by mouth 2 (two) times daily. 180 tablet 3   AREXVY 120 MCG/0.5ML injection      atorvastatin  (LIPITOR) 80 MG tablet TAKE 1 TABLET EVERY EVENING 90 tablet 3   ezetimibe  (ZETIA ) 10 MG tablet Take 1 tablet (10 mg total) by mouth daily. 90 tablet 3   hydrocortisone ointment 0.5 % Apply 1 application topically daily as needed for itching.     metFORMIN  (GLUCOPHAGE -XR) 500 MG 24 hr tablet TAKE 1 TABLET DAILY WITH BREAKFAST 90 tablet 3   metoprolol  succinate (TOPROL -XL) 50 MG 24 hr tablet Take 1 tablet (50 mg total) by mouth daily. Take with or immediately following a meal. 90 tablet 3   Multiple Vitamin (MULTI-VITAMIN) tablet Take 1 tablet by  mouth daily.     olmesartan  (BENICAR ) 5 MG tablet TAKE 1 TABLET EVERY DAY 30 tablet 0   omeprazole  (PRILOSEC) 20 MG capsule Take 1 capsule (20 mg total) by mouth daily. 90 capsule 1   predniSONE  (DELTASONE ) 5 MG tablet TAKE 1 TABLET (5 MG TOTAL) BY MOUTH DAILY WITH BREAKFAST. 30 tablet 0   tadalafil  (CIALIS ) 20 MG tablet Take 0.5-1 tablets (10-20 mg total) by mouth every other day as needed for erectile dysfunction. 10 tablet 11   No current facility-administered medications on file prior to visit.   "

## 2024-11-24 ENCOUNTER — Telehealth: Payer: Self-pay

## 2024-11-24 NOTE — Telephone Encounter (Signed)
 Copied from CRM [id number]. Topic: Clinical - Medication Question >> Nov 24, 2024 10:53 AM Alfonso HERO wrote: Reason for CRM: patient calling to ask if something for his cough can be called into the pharmacy. OTC meds aren't working.  [facility] [address] ([address] [place] - [address] [place] ([place]) [place] [address] Phone: [phone] Fax: [phone]

## 2024-12-15 ENCOUNTER — Ambulatory Visit

## 2024-12-15 ENCOUNTER — Encounter: Admitting: Adult Health
